# Patient Record
Sex: Female | Born: 1990 | Race: Black or African American | Hispanic: No | Marital: Single | Smoking: Current every day smoker
Health system: Southern US, Community
[De-identification: ages and names within clinical notes are randomized; demographics above are authoritative.]

## PROBLEM LIST (undated history)

## (undated) ENCOUNTER — Inpatient Hospital Stay (HOSPITAL_COMMUNITY): Payer: Self-pay

## (undated) DIAGNOSIS — Z3490 Encounter for supervision of normal pregnancy, unspecified, unspecified trimester: Secondary | ICD-10-CM

## (undated) DIAGNOSIS — R51 Headache: Secondary | ICD-10-CM

## (undated) DIAGNOSIS — R569 Unspecified convulsions: Secondary | ICD-10-CM

## (undated) DIAGNOSIS — Z8759 Personal history of other complications of pregnancy, childbirth and the puerperium: Secondary | ICD-10-CM

## (undated) HISTORY — PX: DILATION AND CURETTAGE OF UTERUS: SHX78

---
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
## 2010-02-13 ENCOUNTER — Emergency Department (HOSPITAL_COMMUNITY): Admission: EM | Admit: 2010-02-13 | Discharge: 2010-02-13 | Payer: Self-pay | Admitting: Emergency Medicine

## 2010-06-23 ENCOUNTER — Emergency Department (HOSPITAL_COMMUNITY)
Admission: EM | Admit: 2010-06-23 | Discharge: 2010-06-23 | Payer: Self-pay | Source: Home / Self Care | Admitting: Emergency Medicine

## 2010-10-05 LAB — POCT I-STAT, CHEM 8
Calcium, Ion: 1.09 mmol/L — ABNORMAL LOW (ref 1.12–1.32)
Chloride: 107 mEq/L (ref 96–112)
HCT: 41 % (ref 36.0–46.0)
Hemoglobin: 13.9 g/dL (ref 12.0–15.0)
Sodium: 139 mEq/L (ref 135–145)
TCO2: 23 mmol/L (ref 0–100)

## 2010-10-05 LAB — DIFFERENTIAL
Eosinophils Relative: 0 % (ref 0–5)
Monocytes Absolute: 1.3 10*3/uL — ABNORMAL HIGH (ref 0.1–1.0)
Neutrophils Relative %: 85 % — ABNORMAL HIGH (ref 43–77)

## 2010-10-05 LAB — CBC
MCHC: 35 g/dL (ref 30.0–36.0)
Platelets: 226 10*3/uL (ref 150–400)
RDW: 12.4 % (ref 11.5–15.5)
WBC: 18.5 10*3/uL — ABNORMAL HIGH (ref 4.0–10.5)

## 2010-10-09 LAB — URINALYSIS, ROUTINE W REFLEX MICROSCOPIC
Nitrite: NEGATIVE
Protein, ur: 30 mg/dL — AB
Specific Gravity, Urine: 1.027 (ref 1.005–1.030)
pH: 7 (ref 5.0–8.0)

## 2010-10-09 LAB — BASIC METABOLIC PANEL
BUN: 10 mg/dL (ref 6–23)
CO2: 25 mEq/L (ref 19–32)
Calcium: 9.1 mg/dL (ref 8.4–10.5)
Chloride: 109 mEq/L (ref 96–112)
GFR calc non Af Amer: >60 mL/min (ref >60)
Sodium: 140 mEq/L (ref 135–145)

## 2010-10-09 LAB — POCT PREGNANCY, URINE: Preg Test, Ur: NEGATIVE

## 2010-10-09 LAB — URINE MICROSCOPIC-ADD ON

## 2010-10-09 LAB — GLUCOSE, CAPILLARY

## 2010-11-18 ENCOUNTER — Emergency Department (HOSPITAL_COMMUNITY)
Admission: EM | Admit: 2010-11-18 | Discharge: 2010-11-18 | Disposition: A | Discharge status: Home / Self Care | Payer: Self-pay | Attending: Emergency Medicine | Admitting: Emergency Medicine

## 2010-11-18 DIAGNOSIS — X58XXXA Exposure to other specified factors, initial encounter: Secondary | ICD-10-CM | POA: Insufficient documentation

## 2010-11-18 DIAGNOSIS — G40909 Epilepsy, unspecified, not intractable, without status epilepticus: Secondary | ICD-10-CM | POA: Insufficient documentation

## 2010-11-18 DIAGNOSIS — IMO0002 Reserved for concepts with insufficient information to code with codable children: Secondary | ICD-10-CM | POA: Insufficient documentation

## 2010-11-24 ENCOUNTER — Emergency Department (HOSPITAL_COMMUNITY)
Admission: EM | Admit: 2010-11-24 | Discharge: 2010-11-24 | Disposition: A | Discharge status: Home / Self Care | Payer: Medicaid Other | Attending: Emergency Medicine | Admitting: Emergency Medicine

## 2010-11-24 ENCOUNTER — Emergency Department (HOSPITAL_COMMUNITY): Payer: Medicaid Other

## 2010-11-24 DIAGNOSIS — G40909 Epilepsy, unspecified, not intractable, without status epilepticus: Secondary | ICD-10-CM | POA: Insufficient documentation

## 2010-11-24 DIAGNOSIS — F29 Unspecified psychosis not due to a substance or known physiological condition: Secondary | ICD-10-CM | POA: Insufficient documentation

## 2010-11-24 DIAGNOSIS — R51 Headache: Secondary | ICD-10-CM | POA: Insufficient documentation

## 2010-11-24 LAB — URINALYSIS, ROUTINE W REFLEX MICROSCOPIC
Bilirubin Urine: NEGATIVE
Glucose, UA: NEGATIVE mg/dL
Hgb urine dipstick: NEGATIVE
Protein, ur: NEGATIVE mg/dL
Urobilinogen, UA: 0.2 mg/dL (ref 0.0–1.0)
pH: 5.5 (ref 5.0–8.0)

## 2010-11-24 LAB — RAPID URINE DRUG SCREEN, HOSP PERFORMED
Benzodiazepines: NOT DETECTED
Cocaine: NOT DETECTED

## 2010-11-24 LAB — DIFFERENTIAL
Eosinophils Relative: 0 % (ref 0–5)
Monocytes Absolute: 0.8 10*3/uL (ref 0.1–1.0)
Monocytes Relative: 4 % (ref 3–12)

## 2010-11-24 LAB — CBC
MCV: 92.6 fL (ref 78.0–100.0)
RBC: 4.72 MIL/uL (ref 3.87–5.11)
WBC: 17.3 10*3/uL — ABNORMAL HIGH (ref 4.0–10.5)

## 2010-11-24 LAB — BASIC METABOLIC PANEL
CO2: 15 mEq/L — ABNORMAL LOW (ref 19–32)
Calcium: 9.7 mg/dL (ref 8.4–10.5)
Chloride: 102 mEq/L (ref 96–112)
Creatinine, Ser: 0.74 mg/dL (ref 0.4–1.2)
GFR calc non Af Amer: >60 mL/min (ref >60)
Glucose, Bld: 107 mg/dL — ABNORMAL HIGH (ref 70–99)
Sodium: 142 mEq/L (ref 135–145)

## 2010-11-25 ENCOUNTER — Ambulatory Visit (HOSPITAL_COMMUNITY)
Admit: 2010-11-25 | Discharge: 2010-11-25 | Disposition: A | Discharge status: Home / Self Care | Payer: Medicaid Other | Attending: Emergency Medicine | Admitting: Emergency Medicine

## 2010-11-25 DIAGNOSIS — J320 Chronic maxillary sinusitis: Secondary | ICD-10-CM | POA: Insufficient documentation

## 2010-11-25 DIAGNOSIS — F29 Unspecified psychosis not due to a substance or known physiological condition: Secondary | ICD-10-CM | POA: Insufficient documentation

## 2010-11-25 DIAGNOSIS — G930 Cerebral cysts: Secondary | ICD-10-CM | POA: Insufficient documentation

## 2010-11-25 DIAGNOSIS — R569 Unspecified convulsions: Secondary | ICD-10-CM | POA: Insufficient documentation

## 2010-11-25 DIAGNOSIS — R55 Syncope and collapse: Secondary | ICD-10-CM | POA: Insufficient documentation

## 2010-11-25 LAB — GLUCOSE, CAPILLARY: Glucose-Capillary: 113 mg/dL — ABNORMAL HIGH (ref 70–99)

## 2010-12-02 ENCOUNTER — Inpatient Hospital Stay (INDEPENDENT_AMBULATORY_CARE_PROVIDER_SITE_OTHER)
Admit: 2010-12-02 | Discharge: 2010-12-02 | Disposition: A | Discharge status: Home / Self Care | Payer: No Typology Code available for payment source

## 2010-12-02 ENCOUNTER — Emergency Department (HOSPITAL_COMMUNITY)
Admission: EM | Admit: 2010-12-02 | Discharge: 2010-12-02 | Discharge status: Against Medical Advice | Payer: No Typology Code available for payment source | Attending: Emergency Medicine | Admitting: Emergency Medicine

## 2010-12-02 ENCOUNTER — Emergency Department (INDEPENDENT_AMBULATORY_CARE_PROVIDER_SITE_OTHER): Payer: Medicaid Other

## 2010-12-02 DIAGNOSIS — M545 Low back pain: Secondary | ICD-10-CM

## 2010-12-02 DIAGNOSIS — R51 Headache: Secondary | ICD-10-CM

## 2011-04-18 LAB — POCT I-STAT, CHEM 8
BUN: 9
Calcium, Ion: 1.24
Chloride: 106
HCT: 42
Sodium: 141
TCO2: 26

## 2011-04-18 LAB — RAPID URINE DRUG SCREEN, HOSP PERFORMED
Amphetamines: NOT DETECTED
Cocaine: NOT DETECTED
Opiates: NOT DETECTED
Tetrahydrocannabinol: POSITIVE — AB

## 2011-04-18 LAB — URINALYSIS, ROUTINE W REFLEX MICROSCOPIC
Glucose, UA: NEGATIVE
Ketones, ur: 15 — AB
Leukocytes, UA: NEGATIVE
Nitrite: NEGATIVE
Protein, ur: 30 — AB
pH: 6.5

## 2011-04-18 LAB — URINE MICROSCOPIC-ADD ON

## 2011-04-18 LAB — ETHANOL: Alcohol, Ethyl (B): <5

## 2011-04-18 LAB — POCT PREGNANCY, URINE: Preg Test, Ur: NEGATIVE

## 2011-04-22 ENCOUNTER — Encounter (HOSPITAL_COMMUNITY): Payer: Self-pay

## 2011-04-22 ENCOUNTER — Inpatient Hospital Stay (HOSPITAL_COMMUNITY): Payer: Medicaid Other

## 2011-04-22 ENCOUNTER — Inpatient Hospital Stay (HOSPITAL_COMMUNITY)
Admission: AD | Admit: 2011-04-22 | Discharge: 2011-04-22 | Disposition: A | Discharge status: Home / Self Care | Payer: Medicaid Other | Source: Ambulatory Visit | Attending: Obstetrics & Gynecology | Admitting: Obstetrics & Gynecology

## 2011-04-22 DIAGNOSIS — R109 Unspecified abdominal pain: Secondary | ICD-10-CM | POA: Insufficient documentation

## 2011-04-22 DIAGNOSIS — O99891 Other specified diseases and conditions complicating pregnancy: Secondary | ICD-10-CM | POA: Insufficient documentation

## 2011-04-22 DIAGNOSIS — O26899 Other specified pregnancy related conditions, unspecified trimester: Secondary | ICD-10-CM

## 2011-04-22 HISTORY — DX: Unspecified convulsions: R56.9

## 2011-04-22 LAB — WET PREP, GENITAL: Yeast Wet Prep HPF POC: NONE SEEN

## 2011-04-22 LAB — URINALYSIS, ROUTINE W REFLEX MICROSCOPIC
Bilirubin Urine: NEGATIVE
Glucose, UA: NEGATIVE mg/dL
Hgb urine dipstick: NEGATIVE
Ketones, ur: NEGATIVE mg/dL
Protein, ur: NEGATIVE mg/dL
Urobilinogen, UA: 1 mg/dL (ref 0.0–1.0)

## 2011-04-22 LAB — CBC
HCT: 36.7 % (ref 36.0–46.0)
Hemoglobin: 12.4 g/dL (ref 12.0–15.0)
MCH: 31.1 pg (ref 26.0–34.0)
MCHC: 33.8 g/dL (ref 30.0–36.0)
MCV: 92 fL (ref 78.0–100.0)

## 2011-04-22 NOTE — ED Provider Notes (Signed)
History   Pt presents today c/o lower abd cramping that has worsened over the past 1-2wks. She states she missed her last period. She denies vag dc, bleeding, fever, or any other sx at this time.  Chief Complaint  Patient presents with  . Abdominal Pain   HPI  OB History    Grav Para Term Preterm Abortions TAB SAB Ect Mult Living   2 0 0 0 1 1 0 0 0 0       Past Medical History  Diagnosis Date  . Seizures   . Ovarian cyst     Past Surgical History  Procedure Date  . No past surgeries     No family history on file.  History  Substance Use Topics  . Smoking status: Former Smoker    Quit date: 04/22/2011  . Smokeless tobacco: Not on file  . Alcohol Use: No    Allergies: No Known Allergies  Prescriptions prior to admission  Medication Sig Dispense Refill  . ibuprofen (ADVIL,MOTRIN) 200 MG tablet Take 200 mg by mouth every 6 (six) hours as needed. For headache.       . levETIRAcetam (KEPPRA) 500 MG tablet Take 500 mg by mouth daily.          Review of Systems  Constitutional: Negative for fever.  Cardiovascular: Negative for chest pain.  Gastrointestinal: Positive for abdominal pain. Negative for nausea, vomiting, diarrhea and constipation.  Genitourinary: Negative for dysuria, urgency, frequency and hematuria.  Neurological: Negative for dizziness and headaches.  Psychiatric/Behavioral: Negative for depression and suicidal ideas.   Physical Exam   Blood pressure 117/69, pulse 86, temperature 98.7 F (37.1 C), temperature source Oral, resp. rate 16, height 5' 4.5" (1.638 m), weight 211 lb 6.4 oz (95.89 kg), last menstrual period 03/17/2011, SpO2 98.00%.  Physical Exam  Constitutional: She is oriented to person, place, and time. She appears well-developed and well-nourished. No distress.  HENT:  Head: Normocephalic and atraumatic.  Eyes: EOM are normal. Pupils are equal, round, and reactive to light.  GI: Soft. She exhibits no distension. There is no tenderness.  There is no rebound and no guarding.  Genitourinary: No bleeding around the vagina. No vaginal discharge found.       Uterus NL size and shape. No adnexal masses. Pt nontender on exam. Exam technically difficult secondary to increased body habitus.  Neurological: She is alert and oriented to person, place, and time.  Skin: Skin is warm and dry. She is not diaphoretic.  Psychiatric: She has a normal mood and affect. Her behavior is normal. Judgment and thought content normal.    MAU Course  Procedures  Wet prep and GC/Chlamydia cultures done.  Results for orders placed during the hospital encounter of 04/22/11 (from the past 24 hour(s))  URINALYSIS, ROUTINE W REFLEX MICROSCOPIC     Status: Abnormal   Collection Time   04/22/11  4:00 PM      Component Value Range   Color, Urine YELLOW  YELLOW    Appearance HAZY (*) CLEAR    Specific Gravity, Urine 1.015  1.005 - 1.030    pH 8.0  5.0 - 8.0    Glucose, UA NEGATIVE  NEGATIVE (mg/dL)   Hgb urine dipstick NEGATIVE  NEGATIVE    Bilirubin Urine NEGATIVE  NEGATIVE    Ketones, ur NEGATIVE  NEGATIVE (mg/dL)   Protein, ur NEGATIVE  NEGATIVE (mg/dL)   Urobilinogen, UA 1.0  0.0 - 1.0 (mg/dL)   Nitrite NEGATIVE  NEGATIVE    Leukocytes,  UA NEGATIVE  NEGATIVE   POCT PREGNANCY, URINE     Status: Normal   Collection Time   04/22/11  4:23 PM      Component Value Range   Preg Test, Ur POSITIVE    WET PREP, GENITAL     Status: Abnormal   Collection Time   04/22/11  4:34 PM      Component Value Range   Yeast, Wet Prep NONE SEEN  NONE SEEN    Trich, Wet Prep NONE SEEN  NONE SEEN    Clue Cells, Wet Prep NONE SEEN  NONE SEEN    WBC, Wet Prep HPF POC FEW (*) NONE SEEN   HCG, QUANTITATIVE, PREGNANCY     Status: Abnormal   Collection Time   04/22/11  4:40 PM      Component Value Range   hCG, Beta Chain, Quant, S 38 (*) <5 (mIU/mL)  CBC     Status: Abnormal   Collection Time   04/22/11  4:40 PM      Component Value Range   WBC 11.4 (*) 4.0 - 10.5  (K/uL)   RBC 3.99  3.87 - 5.11 (MIL/uL)   Hemoglobin 12.4  12.0 - 15.0 (g/dL)   HCT 16.1  09.6 - 04.5 (%)   MCV 92.0  78.0 - 100.0 (fL)   MCH 31.1  26.0 - 34.0 (pg)   MCHC 33.8  30.0 - 36.0 (g/dL)   RDW 40.9  81.1 - 91.4 (%)   Platelets 281  150 - 400 (K/uL)   US Ob Comp Less 14 Wks  04/22/2011  *RADIOLOGY REPORT*  Clinical Data: Pain, no vaginal bleeding  OBSTETRIC <14 WK [place] AND TRANSVAGINAL OB US  Technique:  Both transabdominal and transvaginal ultrasound examinations were performed for complete evaluation of the gestation as well as the maternal uterus, adnexal regions, and pelvic cul-de-sac.  Transvaginal technique was performed to assess early pregnancy.  Comparison:  None.  Intrauterine gestational sac:  Not visualized.  Maternal uterus/adnexae: Endometrial complex measures 11 mm in thickness.  Right ovary measures 1.6 x 2.8 x 1.8 cm. Left ovary measures 1.7 x 3.5 x 1.7 cm. No adnexal masses seen.  Trace fluid in the pelvic cul-de-sac.  IMPRESSION: No intrauterine gestational sac is seen.  Endometrial complex measures 11 mm.  Correlate with beta HCG.  By definition, this reflects a pregnancy of unknown location. Differential considerations include early normal IUP, early abnormal IUP, or nonvisualized ectopic pregnancy.  Differentiation is achieved via serial beta HCG supplemented by repeat sonography as clinically warranted.  Repeat ultrasound is suggested in 10 days (or earlier as clinically warranted).  Original Report Authenticated By: Charline Bills, M.D.   US Ob Transvaginal  04/22/2011  *RADIOLOGY REPORT*  Clinical Data: Pain, no vaginal bleeding  OBSTETRIC <14 WK [place] AND TRANSVAGINAL OB US  Technique:  Both transabdominal and transvaginal ultrasound examinations were performed for complete evaluation of the gestation as well as the maternal uterus, adnexal regions, and pelvic cul-de-sac.  Transvaginal technique was performed to assess early pregnancy.  Comparison:  None.  Intrauterine  gestational sac:  Not visualized.  Maternal uterus/adnexae: Endometrial complex measures 11 mm in thickness.  Right ovary measures 1.6 x 2.8 x 1.8 cm. Left ovary measures 1.7 x 3.5 x 1.7 cm. No adnexal masses seen.  Trace fluid in the pelvic cul-de-sac.  IMPRESSION: No intrauterine gestational sac is seen.  Endometrial complex measures 11 mm.  Correlate with beta HCG.  By definition, this reflects a pregnancy of unknown  location. Differential considerations include early normal IUP, early abnormal IUP, or nonvisualized ectopic pregnancy.  Differentiation is achieved via serial beta HCG supplemented by repeat sonography as clinically warranted.  Repeat ultrasound is suggested in 10 days (or earlier as clinically warranted).  Original Report Authenticated By: Charline Bills, M.D.     Assessment and Plan  Abd pain in preg: discussed with pt at length. She will return in 48hrs for a repeat B-quant. Discussed signs and sx of ectopic preg. Discussed diet, activity, risks, and precautions.  Clinton Gallant. Rice III, DrHSc, MPAS, PA-C  04/22/2011, 4:40 PM   Henrietta Hoover, PA 04/22/11 1741

## 2011-04-22 NOTE — Progress Notes (Signed)
Pt states she has been having lower abdominal pain for 1 1/2 weeks. Missed her last period. No bleeding or discharge. Headaches on and off.

## 2011-04-23 LAB — GC/CHLAMYDIA PROBE AMP, GENITAL
Chlamydia, DNA Probe: NEGATIVE
GC Probe Amp, Genital: NEGATIVE

## 2011-04-24 ENCOUNTER — Inpatient Hospital Stay (HOSPITAL_COMMUNITY)
Admission: AD | Admit: 2011-04-24 | Discharge: 2011-04-24 | Disposition: A | Discharge status: Home / Self Care | Payer: Medicaid Other | Source: Ambulatory Visit | Attending: Obstetrics & Gynecology | Admitting: Obstetrics & Gynecology

## 2011-04-24 DIAGNOSIS — O209 Hemorrhage in early pregnancy, unspecified: Secondary | ICD-10-CM | POA: Insufficient documentation

## 2011-04-24 DIAGNOSIS — O039 Complete or unspecified spontaneous abortion without complication: Secondary | ICD-10-CM

## 2011-04-24 LAB — HCG, QUANTITATIVE, PREGNANCY: hCG, Beta Chain, Quant, S: 24 m[IU]/mL — ABNORMAL HIGH (ref <5)

## 2011-04-24 NOTE — ED Provider Notes (Signed)
History   Chief Complaint:  Possible Pregnancy   Brittany Hendrix is  20 y.o. G2P0010 Patient's last menstrual period was 03/17/2011.[name] Patient is here for follow up of quantitative HCG and ongoing surveillance of pregnancy status.   She is [redacted]w[redacted]d weeks gestation  by LMP.    Since her last visit, the patient is without new complaint.   The patient reports bleeding as  none now.  She reports abd pain 3/10, less than over the past few weeks. She is tearful when notified of results. Partner present and supportive.  General ROS:  negative  Her previous Quantitative HCG values are:  04/22/11: 38  Physical Exam   Blood pressure 119/74, pulse 78, temperature 98.7 F (37.1 C), temperature source Oral, last menstrual period 03/17/2011.  Focused Gynecological Exam: examination not indicated  Labs: Results for orders placed during the hospital encounter of 04/24/11 (from the past 24 hour(s))  HCG, QUANTITATIVE, PREGNANCY     Status: Abnormal   Collection Time   04/24/11  9:33 AM      Component Value Range   hCG, Beta Chain, Quant, S 24 (*) <5 (mIU/mL)   Assessment:  [redacted]w[redacted]d weeks gestation  Likely SAB  Plan: The patient is instructed to follow up in in 2 days for repeat quant. Bleeding precautions Support given  Dorathy Kinsman 04/24/2011, 10:38 AM

## 2011-04-24 NOTE — Progress Notes (Signed)
Pt initially said pain was 6/10 but corrected to 3/10

## 2011-04-24 NOTE — ED Provider Notes (Signed)
Agree with above note.  Brittany Hendrix H. 04/24/2011 2:57 PM

## 2011-04-24 NOTE — Progress Notes (Signed)
Denies bleeding having light cramping pain level is the same.

## 2011-04-24 NOTE — ED Provider Notes (Signed)
Agree with above note.  Brittany Hendrix H. 04/24/2011 2:56 PM

## 2011-04-25 ENCOUNTER — Inpatient Hospital Stay (HOSPITAL_COMMUNITY)
Admission: AD | Admit: 2011-04-25 | Discharge: 2011-04-25 | Disposition: A | Discharge status: Home / Self Care | Payer: Medicaid Other | Source: Ambulatory Visit | Attending: Obstetrics and Gynecology | Admitting: Obstetrics and Gynecology

## 2011-04-25 ENCOUNTER — Encounter (HOSPITAL_COMMUNITY): Payer: Self-pay | Admitting: *Deleted

## 2011-04-25 DIAGNOSIS — O039 Complete or unspecified spontaneous abortion without complication: Secondary | ICD-10-CM | POA: Insufficient documentation

## 2011-04-25 LAB — HCG, QUANTITATIVE, PREGNANCY: hCG, Beta Chain, Quant, S: 23 m[IU]/mL — ABNORMAL HIGH (ref <5)

## 2011-04-25 NOTE — ED Provider Notes (Signed)
History   Pt presents today for f/u and repeat B-quant secondary to suspected AB. She states she is having minimal pain and only spotting. She denies fever or any other sx at this time.  Chief Complaint  Patient presents with  . Vaginal Bleeding   HPI  OB History    Grav Para Term Preterm Abortions TAB SAB Ect Mult Living   2 0 0 0 1 1 0 0 0 0       Past Medical History  Diagnosis Date  . Ovarian cyst   . Seizures     Last seizure 5 months ago    Past Surgical History  Procedure Date  . No past surgeries     No family history on file.  History  Substance Use Topics  . Smoking status: Former Smoker    Quit date: 04/22/2011  . Smokeless tobacco: Not on file  . Alcohol Use: No    Allergies: No Known Allergies  Prescriptions prior to admission  Medication Sig Dispense Refill  . ibuprofen (ADVIL,MOTRIN) 200 MG tablet Take 200 mg by mouth every 6 (six) hours as needed. For headache.       . levETIRAcetam (KEPPRA) 500 MG tablet Take 500 mg by mouth daily.          Review of Systems  Constitutional: Negative for fever.  Cardiovascular: Negative for chest pain.  Gastrointestinal: Positive for abdominal pain. Negative for nausea, vomiting, diarrhea and constipation.  Genitourinary: Negative for dysuria, urgency, frequency and hematuria.  Neurological: Negative for dizziness and headaches.  Psychiatric/Behavioral: Negative for depression and suicidal ideas.   Physical Exam   Blood pressure 120/71, pulse 78, temperature 98.4 F (36.9 C), temperature source Oral, resp. rate 16, height 5' 5.5" (1.664 m), weight 209 lb 6.4 oz (94.983 kg), last menstrual period 03/17/2011, SpO2 98.00%.  Physical Exam  Constitutional: She is oriented to person, place, and time. She appears well-developed and well-nourished. No distress.  HENT:  Head: Normocephalic and atraumatic.  Eyes: EOM are normal. Pupils are equal, round, and reactive to light.  GI: Soft. She exhibits no distension.  There is no tenderness. There is no rebound and no guarding.  Neurological: She is alert and oriented to person, place, and time.  Skin: Skin is warm and dry. She is not diaphoretic.  Psychiatric: She has a normal mood and affect. Her behavior is normal. Judgment and thought content normal.    MAU Course  Procedures  B-quant drawn.  Assessment and Plan  SAB: discussed with pt at length. I will call her with the results. Pt will likely need to return in 1wk for repeat B-quant. Discussed diet, activity, risks, and precautions.  Clinton Gallant. Lotus Gover III, DrHSc, MPAS, PA-C  04/25/2011, 11:34 AM   Henrietta Hoover, PA 04/25/11 1138

## 2011-04-25 NOTE — Progress Notes (Signed)
Pt states she had vaginal bleeding at 0400 and some abdominal cramping. Pt states she was seen in MAU 9-30 and was told her hormone level was dropping and she was probably having a miscarriage. Pt did not understand these results. Pt is not wearing a pad at this time and is not having active bleeding at this time. States continues to have some cramping.

## 2011-04-26 ENCOUNTER — Ambulatory Visit (HOSPITAL_COMMUNITY): Payer: Self-pay

## 2011-04-26 NOTE — ED Provider Notes (Signed)
Agree with above note.  Brittany Hendrix 04/26/2011 7:51 AM

## 2011-05-02 ENCOUNTER — Inpatient Hospital Stay (HOSPITAL_COMMUNITY)
Admission: AD | Admit: 2011-05-02 | Discharge: 2011-05-02 | Disposition: A | Discharge status: Home / Self Care | Payer: Medicaid Other | Source: Ambulatory Visit | Attending: Obstetrics & Gynecology | Admitting: Obstetrics & Gynecology

## 2011-05-02 DIAGNOSIS — O039 Complete or unspecified spontaneous abortion without complication: Secondary | ICD-10-CM

## 2011-05-02 NOTE — Progress Notes (Signed)
Pt LMP 03/17/2011, in for follow up labs.  Pt denies bleeding or cramping.

## 2011-05-02 NOTE — ED Provider Notes (Signed)
History   Pt presents today for f/u B-quant secondary to probable complete AB. She states she is doing well and has no pain or bleeding. She has no complaints at this time.  Chief Complaint  Patient presents with  . Follow-up   HPI  OB History    Grav Para Term Preterm Abortions TAB SAB Ect Mult Living   2 0 0 0 1 1 0 0 0 0       Past Medical History  Diagnosis Date  . Ovarian cyst   . Seizures     Last seizure 5 months ago    Past Surgical History  Procedure Date  . No past surgeries     No family history on file.  History  Substance Use Topics  . Smoking status: Former Smoker    Quit date: 04/22/2011  . Smokeless tobacco: Not on file  . Alcohol Use: No    Allergies: No Known Allergies  Prescriptions prior to admission  Medication Sig Dispense Refill  . ibuprofen (ADVIL,MOTRIN) 200 MG tablet Take 200 mg by mouth every 6 (six) hours as needed. For headache.       . levETIRAcetam (KEPPRA) 500 MG tablet Take 500 mg by mouth daily.          Review of Systems  Constitutional: Negative for fever and chills.  Cardiovascular: Negative for chest pain.  Gastrointestinal: Negative for nausea, vomiting, abdominal pain, diarrhea and constipation.  Genitourinary: Negative for dysuria, urgency, frequency and hematuria.  Neurological: Negative for dizziness and headaches.  Psychiatric/Behavioral: Negative for depression and suicidal ideas.   Physical Exam   Blood pressure 118/65, pulse 53, temperature 98.7 F (37.1 C), temperature source Oral, resp. rate 16, height 5\' 4"  (1.626 m), weight 211 lb 3.2 oz (95.8 kg), last menstrual period 03/17/2011.  Physical Exam  Constitutional: She is oriented to person, place, and time. She appears well-developed and well-nourished. No distress.  HENT:  Head: Normocephalic and atraumatic.  Eyes: EOM are normal. Pupils are equal, round, and reactive to light.  GI: Soft. She exhibits no distension. There is no tenderness. There is no  rebound and no guarding.  Neurological: She is alert and oriented to person, place, and time.  Skin: Skin is warm and dry. She is not diaphoretic.  Psychiatric: She has a normal mood and affect. Her behavior is normal. Judgment and thought content normal.    MAU Course  Procedures  Results for orders placed during the hospital encounter of 05/02/11 (from the past 24 hour(s))  HCG, QUANTITATIVE, PREGNANCY     Status: Normal   Collection Time   05/02/11  9:52 PM      Component Value Range   hCG, Beta Chain, Quant, S 1  <5 (mIU/mL)     Assessment and Plan  Complete AB: discussed with pt at length. Discussed diet, activity, risks, and precautions.  Clinton Gallant. Tiffany Calmes III, DrHSc, MPAS, PA-C  05/02/2011, 10:32 PM   Henrietta Hoover, PA 05/02/11 2235

## 2011-09-23 ENCOUNTER — Inpatient Hospital Stay (HOSPITAL_COMMUNITY)
Admission: AD | Admit: 2011-09-23 | Discharge: 2011-09-23 | Disposition: A | Discharge status: Home Health | Payer: Medicaid Other | Source: Ambulatory Visit | Attending: Obstetrics & Gynecology | Admitting: Obstetrics & Gynecology

## 2011-09-23 ENCOUNTER — Encounter (HOSPITAL_COMMUNITY): Payer: Self-pay | Admitting: *Deleted

## 2011-09-23 DIAGNOSIS — Z3201 Encounter for pregnancy test, result positive: Secondary | ICD-10-CM | POA: Insufficient documentation

## 2011-09-23 LAB — POCT PREGNANCY, URINE: Preg Test, Ur: POSITIVE — AB

## 2011-09-23 LAB — URINALYSIS, ROUTINE W REFLEX MICROSCOPIC
Bilirubin Urine: NEGATIVE
Hgb urine dipstick: NEGATIVE
Nitrite: NEGATIVE
Specific Gravity, Urine: 1.02 (ref 1.005–1.030)
pH: 6 (ref 5.0–8.0)

## 2011-09-23 NOTE — ED Provider Notes (Signed)
History     Chief Complaint  Patient presents with  . Possible Pregnancy   HPI 21 y.o. G4P0020 at [redacted]w[redacted]d by lmp. Pt had + UPT at home, denies pain or bleeding. Had SAB in October and states she just wants reassurance about this pregnancy.    Past Medical History  Diagnosis Date  . Ovarian cyst   . Seizures     Last seizure 5 months ago    Past Surgical History  Procedure Date  . No past surgeries     No family history on file.  History  Substance Use Topics  . Smoking status: Former Smoker    Quit date: 04/22/2011  . Smokeless tobacco: Not on file  . Alcohol Use: No    Allergies: No Known Allergies  Prescriptions prior to admission  Medication Sig Dispense Refill  . ibuprofen (ADVIL,MOTRIN) 200 MG tablet Take 200 mg by mouth every 6 (six) hours as needed. For headache.       . levETIRAcetam (KEPPRA) 500 MG tablet Take 500 mg by mouth daily.          Review of Systems  Constitutional: Negative.   Respiratory: Negative.   Cardiovascular: Negative.   Gastrointestinal: Negative for nausea, vomiting, abdominal pain, diarrhea and constipation.  Genitourinary: Negative for dysuria, urgency, frequency, hematuria and flank pain.       Negative for vaginal bleeding, vaginal discharge, dyspareunia  Musculoskeletal: Negative.   Neurological: Negative.   Psychiatric/Behavioral: Negative.    Physical Exam   Blood pressure 102/65, pulse 82, temperature 99.5 F (37.5 C), temperature source Oral, resp. rate 20, height 5' 4.25" (1.632 m), weight 211 lb 6 oz (95.879 kg), last menstrual period 08/27/2010, not currently breastfeeding.  Physical Exam  Nursing note and vitals reviewed. Constitutional: She is oriented to person, place, and time. She appears well-developed and well-nourished. No distress.  Cardiovascular: Normal rate.   Respiratory: Effort normal.  GI: Soft. There is no tenderness.  Neurological: She is alert and oriented to person, place, and time.  Skin: Skin  is warm and dry.  Psychiatric: She has a normal mood and affect.    MAU Course  Procedures  Results for orders placed during the hospital encounter of 09/23/11 (from the past 24 hour(s))  URINALYSIS, ROUTINE W REFLEX MICROSCOPIC     Status: Normal   Collection Time   09/23/11  9:30 PM      Component Value Range   Color, Urine YELLOW  YELLOW    APPearance CLEAR  CLEAR    Specific Gravity, Urine 1.020  1.005 - 1.030    pH 6.0  5.0 - 8.0    Glucose, UA NEGATIVE  NEGATIVE (mg/dL)   Hgb urine dipstick NEGATIVE  NEGATIVE    Bilirubin Urine NEGATIVE  NEGATIVE    Ketones, ur NEGATIVE  NEGATIVE (mg/dL)   Protein, ur NEGATIVE  NEGATIVE (mg/dL)   Urobilinogen, UA 0.2  0.0 - 1.0 (mg/dL)   Nitrite NEGATIVE  NEGATIVE    Leukocytes, UA NEGATIVE  NEGATIVE   POCT PREGNANCY, URINE     Status: Abnormal   Collection Time   09/23/11  9:34 PM      Component Value Range   Preg Test, Ur POSITIVE (*) NEGATIVE      Assessment and Plan  21 y.o. [id number] at [redacted]w[redacted]d Start prenatal care as soon as possible Rev'd precautions Pregnancy verification given  Brittany Hendrix 09/23/2011, 10:47 PM

## 2011-09-23 NOTE — Progress Notes (Signed)
Pt states, " I have been more tired and I took a pregnancy test an it was positive. I miscarried in Oct.

## 2011-09-23 NOTE — Progress Notes (Signed)
Patient is here with concerns of having another miscarriage. She states that she has no pain, or vaginal bleeding or discharge. She wants to be reassured that she won't have a miscarriage. She states that she has not missed a period yet. Her feb. Period was normal and she had a faint positive home pregnancy test.

## 2011-09-24 ENCOUNTER — Encounter (HOSPITAL_COMMUNITY): Payer: Self-pay | Admitting: Advanced Practice Midwife

## 2011-10-04 ENCOUNTER — Encounter (HOSPITAL_COMMUNITY): Payer: Self-pay | Admitting: *Deleted

## 2011-10-04 ENCOUNTER — Inpatient Hospital Stay (HOSPITAL_COMMUNITY)
Admission: AD | Admit: 2011-10-04 | Discharge: 2011-10-04 | Disposition: A | Discharge status: Home / Self Care | Payer: Medicaid Other | Source: Ambulatory Visit | Attending: Obstetrics & Gynecology | Admitting: Obstetrics & Gynecology

## 2011-10-04 ENCOUNTER — Inpatient Hospital Stay (HOSPITAL_COMMUNITY): Payer: Medicaid Other

## 2011-10-04 DIAGNOSIS — O26899 Other specified pregnancy related conditions, unspecified trimester: Secondary | ICD-10-CM

## 2011-10-04 DIAGNOSIS — O2390 Unspecified genitourinary tract infection in pregnancy, unspecified trimester: Secondary | ICD-10-CM | POA: Insufficient documentation

## 2011-10-04 DIAGNOSIS — B9689 Other specified bacterial agents as the cause of diseases classified elsewhere: Secondary | ICD-10-CM | POA: Insufficient documentation

## 2011-10-04 DIAGNOSIS — R109 Unspecified abdominal pain: Secondary | ICD-10-CM

## 2011-10-04 DIAGNOSIS — A499 Bacterial infection, unspecified: Secondary | ICD-10-CM | POA: Insufficient documentation

## 2011-10-04 DIAGNOSIS — N760 Acute vaginitis: Secondary | ICD-10-CM | POA: Insufficient documentation

## 2011-10-04 LAB — DIFFERENTIAL
Basophils Relative: 0 % (ref 0–1)
Eosinophils Absolute: 0 10*3/uL (ref 0.0–0.7)
Eosinophils Relative: 0 % (ref 0–5)
Monocytes Relative: 6 % (ref 3–12)
Neutrophils Relative %: 67 % (ref 43–77)

## 2011-10-04 LAB — WET PREP, GENITAL: Trich, Wet Prep: NONE SEEN

## 2011-10-04 LAB — CBC
Hemoglobin: 12.8 g/dL (ref 12.0–15.0)
MCH: 30.6 pg (ref 26.0–34.0)
MCHC: 33.6 g/dL (ref 30.0–36.0)
MCV: 91.1 fL (ref 78.0–100.0)

## 2011-10-04 LAB — ABO/RH: ABO/RH(D): A POS

## 2011-10-04 MED ORDER — METRONIDAZOLE 500 MG PO TABS: 500.0000 mg | ORAL_TABLET | Freq: Two times a day (BID) | ORAL | Status: AC

## 2011-10-04 NOTE — MAU Note (Signed)
Patient states she has been having abdominal cramping on and off. States she has had multiple SAB's and wants to make sure everything if OK. No bleeding or pain at this time.

## 2011-10-04 NOTE — Discharge Instructions (Signed)
Abdominal Pain During Pregnancy °Belly (abdominal) pain is common during pregnancy. Most of the time, it is not a serious problem. Other times, it can be a sign that something is wrong with the pregnancy. Always tell your doctor if you have belly pain. °HOME CARE °For mild pain: °· Do not have sex (intercourse) or put anything in your vagina until you feel better.  °· Rest until your pain stops. If your pain lasts longer than 1 hour, call your doctor.  °· Drink clear fluids if you feel sick to your stomach (nauseous).  °· Do not eat solid food until you feel better.  °· Only take medicine as told by your doctor.  °· Keep all doctor visits as told.  °GET HELP RIGHT AWAY IF:  °· You are bleeding, leaking fluid, or pieces of tissue come out of your vagina.  °· You have more pain or cramping.  °· You keep throwing up (vomiting).  °· You have pain when you pee (urinate) or have blood in your pee.  °· You have a fever.  °· You do not feel your baby moving as much.  °· You feel very weak or feel like passing out.  °· You have trouble breathing, with or without belly pain.  °· You have a very bad headache and belly pain.  °· You have fluid leaking from your vagina and belly pain.  °· You keep having watery poop (diarrhea).  °· Your belly pain does not go away after resting, or the pain gets worse.  °MAKE SURE YOU:  °· Understand these instructions.  °· Will watch your condition.  °· Will get help right away if you are not doing well or get worse.  °Document Released: 06/29/2009 Document Revised: 06/30/2011 Document Reviewed: 02/04/2011 °[employer]® Patient Information ©2012 [employer]. °

## 2011-10-04 NOTE — MAU Provider Note (Signed)
History   Pt presents today c/o lower abd pain for the past 1wk. She states she is concerned because she has had multiple miscarriages in the past. She denies vag bleeding or pain at this time. She does complain of some vag dc.   CSN: [id number]  Arrival date and time: 10/04/11 1615   None     Chief Complaint  Patient presents with  . Abdominal Cramping   HPI  OB History    Grav Para Term Preterm Abortions TAB SAB Ect Mult Living   4 0 0 0 3 1 2 0 0 0       Past Medical History  Diagnosis Date  . Ovarian cyst   . Seizures     Last seizure 5 months ago    Past Surgical History  Procedure Date  . Dilation and curettage of uterus     abortion    History reviewed. No pertinent family history.  History  Substance Use Topics  . Smoking status: Current Everyday Smoker -- 0.2 packs/day    Last Attempt to Quit: 04/22/2011  . Smokeless tobacco: Never Used  . Alcohol Use: No    Allergies: No Known Allergies  Prescriptions prior to admission  Medication Sig Dispense Refill  . levETIRAcetam (KEPPRA) 500 MG tablet Take 500 mg by mouth daily.        . Prenatal Vit-Fe Fumarate-FA (PRENATAL MULTIVITAMIN) TABS Take 1 tablet by mouth daily.        Review of Systems  Constitutional: Negative for fever and chills.  Eyes: Negative for blurred vision and double vision.  Respiratory: Negative for cough and hemoptysis.   Cardiovascular: Negative for chest pain and palpitations.  Gastrointestinal: Positive for abdominal pain. Negative for nausea, vomiting, diarrhea and constipation.  Genitourinary: Negative for dysuria, urgency, frequency and hematuria.  Neurological: Negative for dizziness and headaches.  Psychiatric/Behavioral: Negative for depression and suicidal ideas.   Physical Exam   Blood pressure 133/69, pulse 112, temperature 98.6 F (37 C), temperature source Oral, resp. rate 16, height 5\' 5"  (1.651 m), weight 212 lb (96.163 kg), last menstrual period 08/28/2011,  SpO2 99.00%.  Physical Exam  Nursing note and vitals reviewed. Constitutional: She is oriented to person, place, and time. She appears well-developed and well-nourished. No distress.  HENT:  Head: Normocephalic and atraumatic.  Eyes: EOM are normal. Pupils are equal, round, and reactive to light.  GI: Soft. She exhibits no distension and no mass. There is no tenderness. There is no rebound and no guarding.  Genitourinary: No bleeding around the vagina. Vaginal discharge found.       Cervix Lg/closed. Thin, milky vag dc present.  Neurological: She is alert and oriented to person, place, and time.  Skin: Skin is warm and dry. She is not diaphoretic.  Psychiatric: She has a normal mood and affect. Her behavior is normal. Judgment and thought content normal.    MAU Course  Procedures  Wet prep and GC/Chlamydia cultures done.  Results for orders placed during the hospital encounter of 10/04/11 (from the past 72 hour(s))  CBC     Status: Normal   Collection Time   10/04/11  4:35 PM      Component Value Range Comment   WBC 10.4  4.0 - 10.5 (K/uL)    RBC 4.18  3.87 - 5.11 (MIL/uL)    Hemoglobin 12.8  12.0 - 15.0 (g/dL)    HCT 40.9  81.1 - 91.4 (%)    MCV 91.1  78.0 - 100.0 (fL)  MCH 30.6  26.0 - 34.0 (pg)    MCHC 33.6  30.0 - 36.0 (g/dL)    RDW 16.1  09.6 - 04.5 (%)    Platelets 260  150 - 400 (K/uL)   DIFFERENTIAL     Status: Normal   Collection Time   10/04/11  4:35 PM      Component Value Range Comment   Neutrophils Relative 67  43 - 77 (%)    Neutro Abs 7.0  1.7 - 7.7 (K/uL)    Lymphocytes Relative 27  12 - 46 (%)    Lymphs Abs 2.8  0.7 - 4.0 (K/uL)    Monocytes Relative 6  3 - 12 (%)    Monocytes Absolute 0.6  0.1 - 1.0 (K/uL)    Eosinophils Relative 0  0 - 5 (%)    Eosinophils Absolute 0.0  0.0 - 0.7 (K/uL)    Basophils Relative 0  0 - 1 (%)    Basophils Absolute 0.0  0.0 - 0.1 (K/uL)   HCG, QUANTITATIVE, PREGNANCY     Status: Abnormal   Collection Time   10/04/11   4:35 PM      Component Value Range Comment   hCG, Beta Chain, Quant, S 7584 (*) <5 (mIU/mL)   ABO/RH     Status: Normal   Collection Time   10/04/11  4:35 PM      Component Value Range Comment   ABO/RH(D) A POS     WET PREP, GENITAL     Status: Abnormal   Collection Time   10/04/11  5:58 PM      Component Value Range Comment   Yeast Wet Prep HPF POC NONE SEEN  NONE SEEN     Trich, Wet Prep NONE SEEN  NONE SEEN     Clue Cells Wet Prep HPF POC MODERATE (*) NONE SEEN     WBC, Wet Prep HPF POC FEW (*) NONE SEEN  MANY BACTERIA SEEN    US shows single IUP with yolk sac. Assessment and Plan  Abd pain in preg: discussed with pt at length. Discussed diet, activity, risks, and precautions.  BV: tx with Flagyl. Warned of antabuse reaction. Discussed diet, activity, risks, and precautions.  Clinton Gallant. Jeremaih Klima III, DrHSc, MPAS, PA-C  10/04/2011, 5:58 PM

## 2011-10-05 LAB — GC/CHLAMYDIA PROBE AMP, GENITAL
Chlamydia, DNA Probe: NEGATIVE
GC Probe Amp, Genital: NEGATIVE

## 2011-10-05 NOTE — MAU Provider Note (Signed)
Attestation of Attending Supervision of Advanced Practitioner: Evaluation and management procedures were performed by the PA/NP/CNM/OB Fellow under my supervision/collaboration. Chart reviewed, and agree with management and plan.  Maisyn Nouri, M.D. 10/05/2011 1:10 PM   

## 2011-10-19 ENCOUNTER — Telehealth (HOSPITAL_COMMUNITY): Payer: Self-pay | Admitting: Nurse Practitioner

## 2011-10-19 NOTE — Telephone Encounter (Signed)
Client having difficulty with morning sickness all day.  Rx Phenergan 25 mg 1/2 to one tablet by mouth PRN q 4 hours as needed for vomiting (#20) no refills.  Advised to begin prenatal care as soon as possible.  Client awaiting Medicaid card.

## 2011-10-26 ENCOUNTER — Encounter (HOSPITAL_COMMUNITY): Payer: Self-pay | Admitting: Emergency Medicine

## 2011-10-26 ENCOUNTER — Emergency Department (HOSPITAL_COMMUNITY)
Admission: EM | Admit: 2011-10-26 | Discharge: 2011-10-26 | Disposition: A | Discharge status: Home / Self Care | Payer: Medicaid Other | Attending: Emergency Medicine | Admitting: Emergency Medicine

## 2011-10-26 ENCOUNTER — Emergency Department (HOSPITAL_COMMUNITY): Payer: Medicaid Other

## 2011-10-26 DIAGNOSIS — M545 Low back pain, unspecified: Secondary | ICD-10-CM | POA: Insufficient documentation

## 2011-10-26 DIAGNOSIS — O26899 Other specified pregnancy related conditions, unspecified trimester: Secondary | ICD-10-CM

## 2011-10-26 DIAGNOSIS — O99891 Other specified diseases and conditions complicating pregnancy: Secondary | ICD-10-CM | POA: Insufficient documentation

## 2011-10-26 DIAGNOSIS — O210 Mild hyperemesis gravidarum: Secondary | ICD-10-CM | POA: Insufficient documentation

## 2011-10-26 DIAGNOSIS — R109 Unspecified abdominal pain: Secondary | ICD-10-CM | POA: Insufficient documentation

## 2011-10-26 DIAGNOSIS — N898 Other specified noninflammatory disorders of vagina: Secondary | ICD-10-CM | POA: Insufficient documentation

## 2011-10-26 HISTORY — DX: Personal history of other complications of pregnancy, childbirth and the puerperium: Z87.59

## 2011-10-26 LAB — POCT I-STAT, CHEM 8
BUN: 4 mg/dL — ABNORMAL LOW (ref 6–23)
Creatinine, Ser: 0.5 mg/dL (ref 0.50–1.10)
Hemoglobin: 12.6 g/dL (ref 12.0–15.0)
Potassium: 4.3 mEq/L (ref 3.5–5.1)
Sodium: 139 mEq/L (ref 135–145)

## 2011-10-26 LAB — URINALYSIS, ROUTINE W REFLEX MICROSCOPIC
Leukocytes, UA: NEGATIVE
Nitrite: NEGATIVE
Specific Gravity, Urine: 1.022 (ref 1.005–1.030)
Urobilinogen, UA: 0.2 mg/dL (ref 0.0–1.0)

## 2011-10-26 LAB — WET PREP, GENITAL: Yeast Wet Prep HPF POC: NONE SEEN

## 2011-10-26 LAB — ABO/RH: ABO/RH(D): A POS

## 2011-10-26 MED ORDER — PROMETHAZINE HCL 25 MG PO TABS: 25.0000 mg | ORAL_TABLET | Freq: Four times a day (QID) | ORAL | Status: DC | PRN

## 2011-10-26 MED ORDER — PRENATAL COMPLETE 14-0.4 MG PO TABS: 1.0000 | ORAL_TABLET | ORAL | Status: DC

## 2011-10-26 NOTE — ED Provider Notes (Signed)
History     CSN: [id number]  Arrival date & time 10/26/11  1803   First MD Initiated Contact with Patient 10/26/11 1855      Chief Complaint  Patient presents with  . Abdominal Pain    (Consider location/radiation/quality/duration/timing/severity/associated sxs/prior treatment) Patient is a 21 y.o. female presenting with abdominal pain. The history is provided by the patient.  Abdominal Pain The primary symptoms of the illness include abdominal pain, nausea, vomiting and diarrhea. The primary symptoms of the illness do not include fever, hematemesis, hematochezia, dysuria, vaginal discharge or vaginal bleeding. The current episode started more than 2 days ago. The problem has not changed since onset. The patient states that she believes she is currently pregnant. The patient has not had a change in bowel habit. Additional symptoms associated with the illness include back pain. Symptoms associated with the illness do not include chills, constipation or frequency.   patient is G1 P0, states she is  approximately 2 months pregnant. States her last menstrual period was 08/28/2011. Since confirmation of pregnancy at [facility] patient states she's had intermittent lower abdominal cramping low back pain and intermittent nausea and vomiting. Denies any unusual vaginal discharge or bleeding.    Past Medical History  Diagnosis Date  . Ovarian cyst   . Seizures     Last seizure 5 months ago  . Abortion history     Past Surgical History  Procedure Date  . Dilation and curettage of uterus     abortion    Family History  Problem Relation Age of Onset  . Asthma Brother     History  Substance Use Topics  . Smoking status: Current Everyday Smoker -- 0.2 packs/day    Types: Cigarettes    Last Attempt to Quit: 04/22/2011  . Smokeless tobacco: Never Used  . Alcohol Use: No    OB History    Grav Para Term Preterm Abortions TAB SAB Ect Mult Living   4 0 0 0 3 1 2 0 0 0        Review of Systems  Constitutional: Negative.  Negative for fever and chills.  HENT: Negative.   Eyes: Negative.   Respiratory: Negative.   Cardiovascular: Negative.   Gastrointestinal: Positive for nausea, vomiting, abdominal pain and diarrhea. Negative for constipation, hematochezia and hematemesis.  Genitourinary: Negative.  Negative for dysuria, frequency, vaginal bleeding and vaginal discharge.  Musculoskeletal: Positive for back pain.  Skin: Negative.   Neurological: Negative.   Hematological: Negative.   Psychiatric/Behavioral: Negative.     Allergies  Review of patient's allergies indicates no known allergies.  Home Medications   Current Outpatient Rx  Name Route Sig Dispense Refill  . ACETAMINOPHEN 325 MG PO TABS Oral Take 325 mg by mouth every 6 (six) hours as needed. Pain.    [name] LEVETIRACETAM 500 MG PO TABS Oral Take 500 mg by mouth 2 (two) times daily.     [name] PRENATAL MULTIVITAMIN CH Oral Take 1 tablet by mouth daily. Gummy vitamin.      BP 102/58  Pulse 53  Temp(Src) 98.8 F (37.1 C) (Oral)  Resp 16  SpO2 100%  LMP 08/28/2011  Physical Exam  Constitutional: She is oriented to person, place, and time. She appears well-developed and well-nourished.  HENT:  Head: Normocephalic and atraumatic.  Eyes: Conjunctivae are normal.  Neck: Neck supple.  Cardiovascular: Normal rate and regular rhythm.   Pulmonary/Chest: Effort normal and breath sounds normal.  Abdominal: Soft. Bowel sounds are normal.  Genitourinary: Uterus  normal. There is no rash, tenderness or lesion on the right labia. There is no rash, tenderness or lesion on the left labia. Cervix exhibits no motion tenderness, no discharge and no friability. Right adnexum displays no mass, no tenderness and no fullness. Left adnexum displays no mass, no tenderness and no fullness. Vaginal discharge found.       Thick white vaginal discharge  Musculoskeletal: Normal range of motion.  Neurological: She is alert  and oriented to person, place, and time.  Skin: Skin is warm and dry. No erythema.  Psychiatric: She has a normal mood and affect.    ED Course  Pelvic exam Date/Time: 10/26/2011 9:10 PM Performed by: Leanne Chang Authorized by: Leanne Chang Consent: Verbal consent obtained. Risks and benefits: risks, benefits and alternatives were discussed Consent given by: patient Patient understanding: patient states understanding of the procedure being performed Required items: required blood products, implants, devices, and special equipment available Patient identity confirmed: verbally with patient and arm band Local anesthesia used: no Patient sedated: no Patient tolerance: Patient tolerated the procedure well with no immediate complications.   Findings and clinical impression discussed with patient. Patient encouraged to get established with the health department for prenatal care. Will discharge home with medication for nausea and prenatal vitamins. Patient instructed to return to [facility] for any pregnancy related symptoms such as lower abdominal cramping, vaginal bleeding or other concerns. Patient agreeable with plan.  Labs Reviewed  POCT I-STAT, CHEM 8 - Abnormal; Notable for the following:    BUN 4 (*)    All other components within normal limits  URINALYSIS, ROUTINE W REFLEX MICROSCOPIC  ABO/RH  WET PREP, GENITAL  GC/CHLAMYDIA PROBE AMP, GENITAL  HCG, QUANTITATIVE, PREGNANCY   US Ob Comp Less 14 Wks  10/26/2011  *RADIOLOGY REPORT*  Clinical Data: 21 year old female with abdominal cramps.  Estimated gestational age by comparison ultrasound 8 weeks and 6 days.  OBSTETRIC <14 WK ULTRASOUND  Technique:  Transabdominal ultrasound was performed for evaluation of the gestation as well as the maternal uterus and adnexal regions.  Comparison:  10/04/2011.  Intrauterine gestational sac: Single Yolk sac: Visible Embryo: Visible Cardiac Activity: Detected Heart Rate: 157 bpm   CRL:  18.3 mm  8w  2d         [place] EDC: 06/04/2012  Maternal uterus/Adnexae: No subchorionic hemorrhage or pelvic free fluid.  Both ovaries appear normal.  The right measures 3.4 x 2.0 x 2.2 cm and the left measures 3.3 x 1.7 x 1.4 cm.  IMPRESSION: Viable singleton intrauterine pregnancy with estimated gestational age of [redacted] weeks and 2 days by crown-rump length.  Original Report Authenticated By: Harley Hallmark, M.D.     No diagnosis found.    MDM  HPI/PE and clinical findings c/w 1. IUP ([redacted]w[redacted]d, no abnormalities noted on ultrasound, patient with mild intermittent lower abdominal cramping and low back pain since confirmation of pregnancy several weeks ago. No vaginal bleeding or unusual discharge. Pt does report intermittent nausea and vomitingw/ and episode of vomiting every 2-3 days. Wet prep shows few clue cells and few WBCs, UA is positive nitrates 3-6 WBCs per field (will culture) patient is encouraged to get established prenatal care, will discharge home with medication for nausea and prenatal vitamins)        Leanne Chang, NP 10/26/11 2155  Roma Kayser Raenah Murley, NP 10/26/11 2156

## 2011-10-26 NOTE — ED Provider Notes (Signed)
Medical screening examination/treatment/procedure(s) were performed by non-physician practitioner and as supervising physician I was immediately available for consultation/collaboration.   Celene Kras, MD 10/26/11 2024

## 2011-10-26 NOTE — ED Notes (Signed)
Patient transported to Ultrasound 

## 2011-10-26 NOTE — Discharge Instructions (Signed)
Please review the instructions below. As discussed, your ultrasound tonight was normal. Your urine showed a few white blood cells which may indicate an early infection, we will culture this urine and if treatment is necessary you will be notified by phone. You will need to contact the [facility] and find out what you need to do to get started with your prenatal care. Take a prenatal vitamin once daily. Take the medication for nausea as needed per instructions. If you should have any concerns such as lower abdominal pain, vaginal bleeding or discharge return to the [facility].    Abdominal Pain During Pregnancy Abdominal discomfort is common in pregnancy. Most of the time, it does not cause harm. There are many causes of abdominal pain. Some causes are more serious than others. Some of the causes of abdominal pain in pregnancy are easily diagnosed. Occasionally, the diagnosis takes time to understand. Other times, the cause is not determined. Abdominal pain can be a sign that something is very wrong with the pregnancy, or the pain may have nothing to do with the pregnancy at all. For this reason, always tell your caregiver if you have any abdominal discomfort. CAUSES Common and harmless causes of abdominal pain include:  Constipation.   Excess gas and bloating.   Round ligament pain. This is pain that is felt in the folds of the groin.   The position the baby or placenta is in.   Baby kicks.   Braxton-Hicks contractions. These are mild contractions that do not cause cervical dilation.  Serious causes of abdominal pain include:  Ectopic pregnancy. This happens when a fertilized egg implants outside of the uterus.   Miscarriage.   Preterm labor. This is when labor starts at less than 37 weeks of pregnancy.   Placental abruption. This is when the placenta partially or completely separates from the uterus.   Preeclampsia.  This is often associated with high blood pressure and has been referred to as "toxemia in pregnancy."   Uterine or amniotic fluid infections.  Causes unrelated to pregnancy include:  Urinary tract infection.   Gallbladder stones or inflammation.   Hepatitis or other liver illness.   Intestinal problems, stomach flu, food poisoning, or ulcer.   Appendicitis.   Kidney (renal) stones.   Kidney infection (pylonephritis).  HOME CARE INSTRUCTIONS  For mild pain:  Do not have sexual intercourse or put anything in your vagina until your symptoms go away completely.   Get plenty of rest until your pain improves. If your pain does not improve in 1 hour, call your caregiver.   Drink clear fluids if you feel nauseous. Avoid solid food as long as you are uncomfortable or nauseous.   Only take medicine as directed by your caregiver.   Keep all follow-up appointments with your caregiver.  SEEK IMMEDIATE MEDICAL CARE IF:  You are bleeding, leaking fluid, or passing tissue from the vagina.   You have increasing pain or cramping.   You have persistent vomiting.   You have painful or bloody urination.   You have a fever.   You notice a decrease in your baby's movements.   You have extreme weakness or feel faint.   You have shortness of breath, with or without abdominal pain.   You develop a severe headache with abdominal pain.   You have abnormal vaginal discharge with abdominal pain.   You have persistent diarrhea.   You have abdominal pain that continues even  after rest, or gets worse.  MAKE SURE YOU:   Understand these instructions.   Will watch your condition.   Will get help right away if you are not doing well or get worse.  Document Released: 07/11/2005 Document Revised: 06/30/2011 Document Reviewed: 02/04/2011

## 2011-10-26 NOTE — ED Notes (Signed)
PA, Schorr at bedside. 

## 2011-10-26 NOTE — ED Notes (Signed)
Pt co abdominal pain, described as cramps, pt states she is 2 months pregnant and has had cramps the entire pregnancy. Pt reports she had a miscarriage in October 2012 and states she had cramps before having a miscarriage. Pt also co of nausea and vomiting NO vaginal bleeding or discharge.

## 2011-10-26 NOTE — ED Notes (Signed)
Patient transported to US 

## 2011-10-26 NOTE — ED Notes (Signed)
PA, Paz at bedside.

## 2011-10-26 NOTE — ED Provider Notes (Signed)
This patient has been screened, labs, & imaging ordered. VSS, NAD.   Jaci Carrel, [place] 10/26/11 2022

## 2011-10-28 LAB — URINE CULTURE
Colony Count: 30000
Culture  Setup Time: 201304040530

## 2011-10-28 LAB — GC/CHLAMYDIA PROBE AMP, GENITAL: GC Probe Amp, Genital: NEGATIVE

## 2011-10-28 NOTE — ED Provider Notes (Signed)
Medical screening examination/treatment/procedure(s) were performed by non-physician practitioner and as supervising physician I was immediately available for consultation/collaboration.   Alizae Bechtel R Esma Kilts, MD 10/28/11 0703 

## 2011-11-02 ENCOUNTER — Other Ambulatory Visit: Payer: Self-pay | Admitting: Advanced Practice Midwife

## 2011-11-02 MED ORDER — METRONIDAZOLE 500 MG PO TABS: 500.0000 mg | ORAL_TABLET | Freq: Two times a day (BID) | ORAL | Status: AC

## 2011-11-22 ENCOUNTER — Inpatient Hospital Stay (HOSPITAL_COMMUNITY)
Admission: AD | Admit: 2011-11-22 | Discharge: 2011-11-22 | Disposition: A | Discharge status: Home / Self Care | Payer: Medicaid Other | Source: Ambulatory Visit | Attending: Obstetrics and Gynecology | Admitting: Obstetrics and Gynecology

## 2011-11-22 ENCOUNTER — Other Ambulatory Visit (HOSPITAL_COMMUNITY): Payer: Self-pay | Admitting: Obstetrics and Gynecology

## 2011-11-22 ENCOUNTER — Ambulatory Visit (HOSPITAL_COMMUNITY)
Admission: RE | Admit: 2011-11-22 | Discharge: 2011-11-22 | Disposition: A | Discharge status: Home / Self Care | Payer: Medicaid Other | Source: Ambulatory Visit | Attending: Obstetrics and Gynecology | Admitting: Obstetrics and Gynecology

## 2011-11-22 DIAGNOSIS — O3680X0 Pregnancy with inconclusive fetal viability, not applicable or unspecified: Secondary | ICD-10-CM

## 2011-11-22 DIAGNOSIS — O368390 Maternal care for abnormalities of the fetal heart rate or rhythm, unspecified trimester, not applicable or unspecified: Secondary | ICD-10-CM | POA: Insufficient documentation

## 2011-11-22 NOTE — MAU Note (Signed)
Patient has an outpatient appointment at 1045 for ultrasound. Not to be seen in MAU at this time.

## 2011-12-21 ENCOUNTER — Inpatient Hospital Stay (HOSPITAL_COMMUNITY)
Admission: AD | Admit: 2011-12-21 | Discharge: 2011-12-21 | Disposition: A | Discharge status: Home / Self Care | Payer: Medicaid Other | Source: Ambulatory Visit | Attending: Obstetrics and Gynecology | Admitting: Obstetrics and Gynecology

## 2011-12-21 ENCOUNTER — Encounter (HOSPITAL_COMMUNITY): Payer: Self-pay | Admitting: *Deleted

## 2011-12-21 DIAGNOSIS — O021 Missed abortion: Secondary | ICD-10-CM | POA: Insufficient documentation

## 2011-12-21 DIAGNOSIS — O039 Complete or unspecified spontaneous abortion without complication: Secondary | ICD-10-CM

## 2011-12-21 LAB — BASIC METABOLIC PANEL
BUN: 8 mg/dL (ref 6–23)
Calcium: 9.1 mg/dL (ref 8.4–10.5)
Creatinine, Ser: 0.59 mg/dL (ref 0.50–1.10)
GFR calc non Af Amer: >90 mL/min (ref >90)
Glucose, Bld: 105 mg/dL — ABNORMAL HIGH (ref 70–99)
Sodium: 134 mEq/L — ABNORMAL LOW (ref 135–145)

## 2011-12-21 LAB — CBC
HCT: 35.8 % — ABNORMAL LOW (ref 36.0–46.0)
MCH: 30.8 pg (ref 26.0–34.0)
MCHC: 33.5 g/dL (ref 30.0–36.0)
MCHC: 33.6 g/dL (ref 30.0–36.0)
Platelets: 208 10*3/uL (ref 150–400)
RDW: 12.3 % (ref 11.5–15.5)
RDW: 12.4 % (ref 11.5–15.5)

## 2011-12-21 MED ORDER — NORGESTIMATE-ETH ESTRADIOL 0.25-35 MG-MCG PO TABS: 1.0000 | ORAL_TABLET | Freq: Every day | ORAL | Status: DC

## 2011-12-21 MED ORDER — LACTATED RINGERS IV BOLUS (SEPSIS)
1000.0000 mL | Freq: Once | INTRAVENOUS | Status: AC
  Administered 2011-12-21: 1000 mL via INTRAVENOUS

## 2011-12-21 MED ORDER — FERROUS SULFATE 325 (65 FE) MG PO TABS: 325.0000 mg | ORAL_TABLET | Freq: Every day | ORAL | Status: DC

## 2011-12-21 MED ORDER — BUTORPHANOL TARTRATE 2 MG/ML IJ SOLN
1.0000 mg | Freq: Once | INTRAMUSCULAR | Status: AC
  Administered 2011-12-21: 1 mg via INTRAVENOUS
  Filled 2011-12-21: qty 1

## 2011-12-21 MED ORDER — OXYTOCIN 20 UNITS IN LACTATED RINGERS INFUSION - SIMPLE
500.0000 mL/h | Freq: Once | INTRAVENOUS | Status: AC
  Administered 2011-12-21: 500 mL/h via INTRAVENOUS

## 2011-12-21 MED ORDER — SODIUM CHLORIDE 0.9 % IV BOLUS (SEPSIS)
1000.0000 mL | Freq: Once | INTRAVENOUS | Status: AC
  Administered 2011-12-21: 1000 mL via INTRAVENOUS

## 2011-12-21 MED ORDER — OXYTOCIN 20 UNITS IN LACTATED RINGERS INFUSION - SIMPLE
INTRAVENOUS | Status: AC
  Administered 2011-12-21: 500 mL/h via INTRAVENOUS
  Filled 2011-12-21: qty 1000

## 2011-12-21 NOTE — Progress Notes (Signed)
Patient has stabilized, now able to take p.o. Will recheck CBC to document hgb change, consider discharge home if ambulatory without dizziness

## 2011-12-21 NOTE — Discharge Instructions (Signed)
Miscarriage (Spontaneous Miscarriage)  A miscarriage is when you lose your baby before the twentieth week of pregnancy. Miscarriages happen in 15-20% of pregnancies. Most miscarriages happen in the first 13 weeks of the pregnancy. In medical terms, this is called a spontaneous miscarriage or early pregnancy loss. No further treatment is needed when the miscarriage is complete and all products of conception have been passed out of the body. You can begin trying for another pregnancy as soon as your caregiver says it is okay.  CAUSES    Most causes are not known.   Genetic problems like abnormal, not enough or too many chromosomes.   Infection of the cervix or uterus.   An abnormal shaped uterus, fibroid tumors or congenital abnormalities.   Hormone problems.   Medical problems.   Incompetent cervix, the tissue in the cervix is not strong enough to hold the pregnancy.   Smoking, too much alcohol use and illegal drugs.   Trauma.  SYMPTOMS    Bleeding or spotting from the vagina.   Cramping of the lower abdomen.   Passing of fluid from the vagina with or without cramps or pain.   Passing fetal tissue.  TREATMENT    Sometimes no further treatment is necessary if you pass all the tissue in the uterus.   If partial parts of the fetus or placenta remain in the body (incomplete miscarriage), tissue left behind may become infected. Usually a D and C (Dilatation and Curettage) suction or scrapping of the uterus is necessary to remove the remaining tissue in uterus. The procedure is only done when your caregiver knows that there is no chance for the pregnancy to continue. This is determined by a physical exam, a negative pregnancy test, blood tests and perhaps an ultrasound revealing a dead fetus or no fetus developing because a problem occurred at conception (when the sperm and egg unite).   Medications may be necessary, antibiotics if there is an infection or medications to contract the uterus if there is a  lot of bleeding.   If you have Rh negative blood and your partner is Rh positive, you will need a Rho-gam shot (an immune globulin vaccine). This will protect your baby from having Rh blood problems in future pregnancies.  HOME CARE INSTRUCTIONS    Your caregiver may order bed rest (up to the bathroom only). He or she may allow you to continue light activity. You may need to make arrangements for the care of children and for any other responsibilities.   Keep track of the number of pads you use each day and how soaked (saturated) they are. Record this information.   Do not use tampons. Do not douche or have sexual intercourse until approved by your caregiver.   Only take over-the-counter or prescription medicines for pain, discomfort or fever as directed by your caregiver.   Do not take aspirin because it can cause bleeding.   It is very important to keep all follow-up appointments for re-evaluations and continuing management.   Tell your caregiver if you are experiencing domestic violence.   Women who have an Rh negative blood type (i.e., A, B, AB, or O negative) need to receive a drug called Rh(D) immune globulin (RhoGam). This medicine helps protect future fetuses against problems that can occur if an Rh negative mother is carrying a baby who is Rh positive.   If you and/or your partner are having problems with guilt or grieving, talk to your caregiver or seek counseling to help   you cope with the pregnancy loss. Allow enough time to grieve before trying to get pregnant again.  SEEK IMMEDIATE MEDICAL CARE IF:    You have severe cramps or pain in your stomach, back, or belly (abdomen).   You have a fever.   You pass large clots or tissue. Save any tissue for your caregiver to inspect.   Your bleeding increases.   You become light-headed, weak or have fainting episodes.   You develop chills.  Document Released: 01/04/2001 Document Revised: 06/30/2011 Document Reviewed: 02/11/2008  [employer] Patient  Information 2012 [employer].

## 2011-12-21 NOTE — Progress Notes (Signed)
Pt  Sitting up in bed. Scant amount of bleeding noted on peri pad.

## 2011-12-21 NOTE — MAU Provider Note (Signed)
See Documentation by Kerrie Buffalo, NP.  Pt presented with active bleeding as a part of miscarriage. Bedside ultrasound shows a small anteflexed uterus with heterogenous tissue and probable clots.  No IUGS notable in uterus.  Speculum exam shows  Generous clots and visible gestational sac, which upon rupture reveals an autolyzed fetus consistent with 8 wk, Placenta and gest sac extracted from lower uterus via ring forceps, intact.  Bleeding improved. Pt then at 19:20 developed nausea, sat up with syncopal response, which responded to supine position and smelling salts with pulse 140, and BP 104/40 quickly improving to 90/60 with pulse 94 with initiation of iv bolus in addition to pitocin infusion.  Patient having no continued bleeding.   Will hydrate x 2 hours, with continued assessments.

## 2011-12-21 NOTE — MAU Provider Note (Signed)
Pt now alert, desires to void.  No continued bleeding.  Hgb returns@ 9.7. Events of evening reviewed with patient and partner. Will d/c on Sprintec.

## 2011-12-21 NOTE — MAU Provider Note (Signed)
History     CSN: [id number]  Arrival date & time 12/21/11  1857   None     No chief complaint on file.    HPI Brittany Hendrix is a 21 y.o. female @ [redacted]w[redacted]d gestation by LMP. Had previous ultrasound that showed an 8 week IUFD. Patient doing expectant management. Today started bleeding heavy, passing clots and feeling dizzy. Brought to MAU by family. The history was provided by the patient.  Past Medical History  Diagnosis Date  . Ovarian cyst   . Seizures     Last seizure 5 months ago  . Abortion history     Past Surgical History  Procedure Date  . Dilation and curettage of uterus     abortion    Family History  Problem Relation Age of Onset  . Asthma Brother     History  Substance Use Topics  . Smoking status: Current Everyday Smoker -- 0.2 packs/day    Types: Cigarettes    Last Attempt to Quit: 04/22/2011  . Smokeless tobacco: Never Used  . Alcohol Use: No    OB History    Grav Para Term Preterm Abortions TAB SAB Ect Mult Living   4 0 0 0 3 1 2 0 0 0       Review of Systems  Allergies  Review of patient's allergies indicates no known allergies.  Home Medications  No current outpatient prescriptions on file.  LMP 08/28/2011  Physical Exam  ED Course: Dr. Emelda Fear in to examine the patient and take over care.  Procedures    MDM

## 2011-12-21 NOTE — MAU Note (Signed)
Pt states that she was evaluated approximately 3 weeks ago and was told that the baby had stopped growing and no fetal heart rate confirmed by ultrasound. Pt states she was at work this evening and started bleeding and passing clots. When she arrived pt had large amount of blood noted on pants with several clots. Pt was evaluated by Dr Emelda Fear  With pelvic exam. Pt passed tissue and clots with pelvic exam.

## 2012-01-24 ENCOUNTER — Other Ambulatory Visit: Payer: Self-pay | Admitting: Family

## 2012-01-24 MED ORDER — NORGESTIMATE-ETH ESTRADIOL 0.25-35 MG-MCG PO TABS: 1.0000 | ORAL_TABLET | Freq: Every day | ORAL | Status: DC

## 2012-04-08 ENCOUNTER — Emergency Department (HOSPITAL_COMMUNITY)
Admission: EM | Admit: 2012-04-08 | Discharge: 2012-04-08 | Disposition: A | Discharge status: Home / Self Care | Payer: Medicaid Other | Attending: Emergency Medicine | Admitting: Emergency Medicine

## 2012-04-08 DIAGNOSIS — G40802 Other epilepsy, not intractable, without status epilepticus: Secondary | ICD-10-CM | POA: Insufficient documentation

## 2012-04-08 DIAGNOSIS — R109 Unspecified abdominal pain: Secondary | ICD-10-CM | POA: Insufficient documentation

## 2012-04-08 DIAGNOSIS — G40909 Epilepsy, unspecified, not intractable, without status epilepticus: Secondary | ICD-10-CM

## 2012-04-08 DIAGNOSIS — F17200 Nicotine dependence, unspecified, uncomplicated: Secondary | ICD-10-CM | POA: Insufficient documentation

## 2012-04-08 DIAGNOSIS — Z79899 Other long term (current) drug therapy: Secondary | ICD-10-CM | POA: Insufficient documentation

## 2012-04-08 DIAGNOSIS — R29818 Other symptoms and signs involving the nervous system: Secondary | ICD-10-CM

## 2012-04-08 LAB — BASIC METABOLIC PANEL
BUN: 7 mg/dL (ref 6–23)
GFR calc Af Amer: >90 mL/min (ref >90)
GFR calc non Af Amer: >90 mL/min (ref >90)
Potassium: 4.4 mEq/L (ref 3.5–5.1)

## 2012-04-08 LAB — URINALYSIS, ROUTINE W REFLEX MICROSCOPIC
Ketones, ur: NEGATIVE mg/dL
Leukocytes, UA: NEGATIVE
Nitrite: NEGATIVE
Specific Gravity, Urine: 1.018 (ref 1.005–1.030)
pH: 6.5 (ref 5.0–8.0)

## 2012-04-08 LAB — URINE MICROSCOPIC-ADD ON

## 2012-04-08 LAB — GLUCOSE, CAPILLARY: Glucose-Capillary: 87 mg/dL (ref 70–99)

## 2012-04-08 LAB — PREGNANCY, URINE: Preg Test, Ur: NEGATIVE

## 2012-04-08 MED ORDER — KETOROLAC TROMETHAMINE 30 MG/ML IJ SOLN
30.0000 mg | Freq: Once | INTRAMUSCULAR | Status: AC
  Administered 2012-04-08: 30 mg via INTRAVENOUS
  Filled 2012-04-08: qty 1

## 2012-04-08 MED ORDER — IBUPROFEN 600 MG PO TABS: 600.0000 mg | ORAL_TABLET | Freq: Four times a day (QID) | ORAL | Status: DC | PRN

## 2012-04-08 MED ORDER — METOCLOPRAMIDE HCL 5 MG/ML IJ SOLN
10.0000 mg | Freq: Once | INTRAMUSCULAR | Status: AC
  Administered 2012-04-08: 10 mg via INTRAVENOUS
  Filled 2012-04-08: qty 2

## 2012-04-08 MED ORDER — SODIUM CHLORIDE 0.9 % IV SOLN
Freq: Once | INTRAVENOUS | Status: AC
  Administered 2012-04-08: 19:00:00 via INTRAVENOUS

## 2012-04-08 NOTE — ED Notes (Signed)
ZOX:[id number]<VW> Expected date:<BR> Expected time:<BR> Means of arrival:<BR> Comments:<BR> Medic [age] y/o female felt like she would have a seizure but did not.

## 2012-04-08 NOTE — ED Notes (Signed)
Per ems pt has hx of seizures. Has not had seizure in 1 year. Pt Reports today that "she felt like she was going to have a seizure". Did not give more details about reasoning. Pt reports chronic back pain as well, pain not new, but pt has not been able to get back pain checked out yet.

## 2012-04-08 NOTE — ED Notes (Signed)
Pt requesting something for a headache. 

## 2012-04-08 NOTE — ED Provider Notes (Signed)
History     CSN: [id number]  Arrival date & time 04/08/12  1449   First MD Initiated Contact with Patient 04/08/12 1510      Chief Complaint  Patient presents with  . hx of seizures, preseizure s/s     (Consider location/radiation/quality/duration/timing/severity/associated sxs/prior treatment) HPI Comments: 21 y/o woman with hx of seizures comes in with cc of seizure aura. Pt reports that she hasn't had a seizure in 1 year, and has been taking her keppra as prescribed. She has no recent infections, no n/v/f/c/uti like symptoms and is not pregnant. No trauma. She is having some left sided flank pain. Michaell Cowing is described as just "feeling like she will have a seizure anytime."  The history is provided by the patient.    Past Medical History  Diagnosis Date  . Ovarian cyst   . Seizures     Last seizure 5 months ago  . Abortion history     Past Surgical History  Procedure Date  . Dilation and curettage of uterus     abortion    Family History  Problem Relation Age of Onset  . Asthma Brother     History  Substance Use Topics  . Smoking status: Current Every Day Smoker -- 0.2 packs/day    Types: Cigarettes    Last Attempt to Quit: 04/22/2011  . Smokeless tobacco: Never Used  . Alcohol Use: No    OB History    Grav Para Term Preterm Abortions TAB SAB Ect Mult Living   4 0 0 0 4 1 3 0 0 0       Review of Systems  Constitutional: Negative for activity change.  HENT: Negative for facial swelling and neck pain.   Respiratory: Negative for cough, shortness of breath and wheezing.   Cardiovascular: Negative for chest pain.  Gastrointestinal: Negative for nausea, vomiting, abdominal pain, diarrhea, constipation, blood in stool and abdominal distention.  Genitourinary: Negative for dysuria, hematuria and difficulty urinating.  Skin: Negative for color change.  Neurological: Negative for seizures, speech difficulty and headaches.  Hematological: Does not bruise/bleed  easily.  Psychiatric/Behavioral: Negative for confusion.    Allergies  Review of patient's allergies indicates no known allergies.  Home Medications   Current Outpatient Rx  Name Route Sig Dispense Refill  . ACETAMINOPHEN 325 MG PO TABS Oral Take 325 mg by mouth every 6 (six) hours as needed. Pain.    [name] FERROUS SULFATE 325 (65 FE) MG PO TABS Oral Take 1 tablet (325 mg total) by mouth daily. 30 tablet 0  . LEVETIRACETAM 500 MG PO TABS Oral Take 500 mg by mouth 2 (two) times daily.     [name] NAPROXEN SODIUM 220 MG PO TABS Oral Take 220 mg by mouth 2 (two) times daily with a meal.    . NORGESTIMATE-ETH ESTRADIOL 0.25-35 MG-MCG PO TABS Oral Take 1 tablet by mouth daily. 1 Package 11    BP 98/54  Pulse 50  Temp 98.4 F (36.9 C) (Oral)  Resp 16  SpO2 98%  Breastfeeding? Unknown  Physical Exam  Constitutional: She is oriented to person, place, and time. She appears well-developed and well-nourished.  HENT:  Head: Normocephalic and atraumatic.  Eyes: EOM are normal. Pupils are equal, round, and reactive to light.  Neck: Neck supple.  Cardiovascular: Normal rate, regular rhythm and normal heart sounds.   No murmur heard. Pulmonary/Chest: Effort normal. No respiratory distress.  Abdominal: Soft. She exhibits no distension. There is no tenderness. There is no rebound and  no guarding.  Genitourinary:       Left flank tenderness  Neurological: She is alert and oriented to person, place, and time.  Skin: Skin is warm and dry.    ED Course  Procedures (including critical care time)   Labs Reviewed  GLUCOSE, CAPILLARY  URINALYSIS, ROUTINE W REFLEX MICROSCOPIC  PREGNANCY, URINE  BASIC METABOLIC PANEL   No results found.   No diagnosis found.    MDM  DDx: -Seizure disorder -Meningitis -Trauma -ICH -Electrolyte abnormality -Metabolic derangement -Stroke -Toxin induced seizures -Medication side effects -Hypoxia -Hypoglycemia   Pt comes in with cc of seizure aura. She  is taking her keppra. No seizures yet. We will get a basic screen to see if there is anything reversible we can treat/fix. ROs is + for flank pain. No uti like sx, no n/v/f/c. We will check UA and Upreg. No concerns for renal stones  -this pain is mild and brought on only on ROS.        Derwood Kaplan, MD 04/08/12 [phone]

## 2012-04-14 ENCOUNTER — Emergency Department (HOSPITAL_COMMUNITY): Payer: Medicaid Other

## 2012-04-14 ENCOUNTER — Emergency Department (HOSPITAL_COMMUNITY)
Admission: EM | Admit: 2012-04-14 | Discharge: 2012-04-14 | Disposition: A | Discharge status: Home / Self Care | Payer: Medicaid Other | Attending: Emergency Medicine | Admitting: Emergency Medicine

## 2012-04-14 ENCOUNTER — Encounter (HOSPITAL_COMMUNITY): Payer: Self-pay | Admitting: Emergency Medicine

## 2012-04-14 DIAGNOSIS — R569 Unspecified convulsions: Secondary | ICD-10-CM | POA: Insufficient documentation

## 2012-04-14 LAB — URINALYSIS, ROUTINE W REFLEX MICROSCOPIC
Bilirubin Urine: NEGATIVE
Glucose, UA: NEGATIVE mg/dL
Ketones, ur: NEGATIVE mg/dL
Nitrite: NEGATIVE
Protein, ur: NEGATIVE mg/dL
pH: 7 (ref 5.0–8.0)

## 2012-04-14 MED ORDER — ACETAMINOPHEN 325 MG PO TABS
650.0000 mg | ORAL_TABLET | Freq: Once | ORAL | Status: AC
  Administered 2012-04-14: 650 mg via ORAL
  Filled 2012-04-14: qty 2

## 2012-04-14 NOTE — ED Notes (Signed)
WUX:[id number]<WN> Expected date:04/14/12<BR> Expected time:<BR> Means of arrival:<BR> Comments:<BR> 20 y/o seizure -- witnessed, hit head. c-collar/backboard

## 2012-04-14 NOTE — ED Notes (Addendum)
Pt stated she only remembers going to work this morning at 1100 and felt "twitchy" but did not think she was going to have a seizure.  Pt has not eaten since last night.  Pt had witnessed seizure, and fell hitting head on table.  Pt reports no head pain, only back pain 10/10 which she states started before her seizure.  Pt poor historian, NAD noted at this time.

## 2012-04-14 NOTE — ED Notes (Signed)
Pt reports she remember feeling like she was falling and her manager caught her and feeling her body jumping then is unable to remember further events of the day.  Pt has history of seizures prior today. Pt last seizure was several weeks ago.  Pt denies changes in Keppra dosage, but has not been taking her Keppra twice a day as instructed.  Pt reports taking her medication this AM.  Pt denies pain at this time.  Pt alert and oriented and moving all extremities.

## 2012-04-14 NOTE — ED Notes (Signed)
Per EMS, pt had a witnessed seizure at work and hit her head on a table.  Pt initially told EMS she had not taken her Sheralyn Boatman, then stated she took it this morning.  Pt also initially told EMS that she had not had a seizure in 3-4 weeks, then stated she had recently had one. Pt noted to be tearful, asking to call her mother.

## 2012-04-14 NOTE — ED Notes (Signed)
Pt is currently resting quietly in bed.  Equal chest rise and fall bilaterally, NAD noted at this time.

## 2012-04-14 NOTE — ED Notes (Signed)
Pt removed from LSB.  C-collar placed on patient.  Pt denies pain to palpation of spine, but reports paraspinal pain to right lower back.  c-spine protected during movement. Pt in no apparent distress. No deformity or pain noted along spine.

## 2012-04-14 NOTE — ED Provider Notes (Signed)
History     CSN: [id number]  Arrival date & time 04/14/12  1343   First MD Initiated Contact with Patient 04/14/12 1505      Chief Complaint  Patient presents with  . Seizures    (Consider location/radiation/quality/duration/timing/severity/associated sxs/prior treatment) HPI Comments: The patient is a 21 year old woman with a history of seizure disorder. She had a seizure while at work. She wa therefore brought to [facility] long ED for evaluation by EMS. She is followed for seizure disorder at [facility] neurological. She is on Keppra 1 g twice a day. Her last seizure was she says was last week.   Patient is a 21 y.o. female presenting with seizures. The history is provided by the patient and medical records. No language interpreter was used.  Seizures  This is a recurrent problem. The current episode started less than 1 hour ago. The problem has been rapidly improving. There was 1 seizure. Duration: Unknown duration. Associated symptoms include headaches. Characteristics include rhythmic jerking. The episode was witnessed. The seizures did not continue in the ED. The seizure(s) had no focality. There has been no fever.    Past Medical History  Diagnosis Date  . Ovarian cyst   . Seizures     Last seizure 5 months ago  . Abortion history     Past Surgical History  Procedure Date  . Dilation and curettage of uterus     abortion    Family History  Problem Relation Age of Onset  . Asthma Brother     History  Substance Use Topics  . Smoking status: Current Every Day Smoker -- 0.5 packs/day for 5 years    Types: Cigarettes    Last Attempt to Quit: 04/22/2011  . Smokeless tobacco: Never Used  . Alcohol Use: Yes     occ.    OB History    Grav Para Term Preterm Abortions TAB SAB Ect Mult Living   4 0 0 0 4 1 3 0 0 0       Review of Systems  Constitutional: Negative for fever and chills.  HENT:       Did not bite tongue.  Eyes: Negative.   Respiratory: Negative.     Cardiovascular: Negative.   Gastrointestinal: Negative.   Genitourinary: Negative.   Musculoskeletal: Negative.   Neurological: Positive for seizures and headaches.  Psychiatric/Behavioral: Negative.     Allergies  Review of patient's allergies indicates no known allergies.  Home Medications   Current Outpatient Rx  Name Route Sig Dispense Refill  . ACETAMINOPHEN 325 MG PO TABS Oral Take 325 mg by mouth every 6 (six) hours as needed. Pain.    [name] LEVETIRACETAM 500 MG PO TABS Oral Take 500 mg by mouth 2 (two) times daily.       BP 98/62  Pulse 79  Temp 98.5 F (36.9 C) (Oral)  Resp 18  Wt 212 lb (96.163 kg)  SpO2 98%  LMP 03/30/2012  Physical Exam  Nursing note and vitals reviewed. Constitutional: She is oriented to person, place, and time. She appears well-developed and well-nourished.       In cervical collar.  HENT:  Head: Normocephalic and atraumatic.  Right Ear: External ear normal.  Left Ear: External ear normal.  Mouth/Throat: Oropharynx is clear and moist.  Eyes: Conjunctivae normal and EOM are normal. Pupils are equal, round, and reactive to light.  Neck: Normal range of motion. Neck supple.       No bony deformity or point tenderness of the  neck.  C-collar removed by me.  Cardiovascular: Normal rate, regular rhythm and normal heart sounds.   Pulmonary/Chest: Effort normal and breath sounds normal.  Abdominal: Soft. Bowel sounds are normal.  Musculoskeletal: Normal range of motion. She exhibits no edema and no tenderness.  Neurological: She is alert and oriented to person, place, and time. She has normal reflexes.  Skin: Skin is warm and dry.  Psychiatric: She has a normal mood and affect. Her behavior is normal.    ED Course  Procedures (including critical care time)  3:16 PM Pt was seen and had physical examination.  She is on Keppra 1 gram po bid, as prescribed by [facility].  Advised we would observe her to see if she had further  seizures.  5:26 PM Results for orders placed during the hospital encounter of 04/14/12  URINALYSIS, ROUTINE W REFLEX MICROSCOPIC      Component Value Range   Color, Urine YELLOW  YELLOW   APPearance CLOUDY (*) CLEAR   Specific Gravity, Urine 1.019  1.005 - 1.030   pH 7.0  5.0 - 8.0   Glucose, UA NEGATIVE  NEGATIVE mg/dL   Hgb urine dipstick NEGATIVE  NEGATIVE   Bilirubin Urine NEGATIVE  NEGATIVE   Ketones, ur NEGATIVE  NEGATIVE mg/dL   Protein, ur NEGATIVE  NEGATIVE mg/dL   Urobilinogen, UA 0.2  0.0 - 1.0 mg/dL   Nitrite NEGATIVE  NEGATIVE   Leukocytes, UA NEGATIVE  NEGATIVE   Dg Lumbar Spine Complete  04/14/2012  *RADIOLOGY REPORT*  Clinical Data: History of fall complaining of lower back pain.  LUMBAR SPINE - COMPLETE 4+ VIEW  Comparison: No priors  Findings: Multiple views of the lumbar spine demonstrate no acute displaced fracture or definite compression type fracture. Alignment is anatomic.  IMPRESSION: 1.  No acute radiographic abnormality of the lumbar spine to account for the patient's symptoms.   Original Report Authenticated By: Florencia Reasons, M.D.     Lab tests were negative.  She did not want C-spine films, did not low back pain, so LS spine films were done that were negative.  No further seizures.  Released.  She is to continue on Keppra 1000 mg bid.   1. Seizure         Carleene Cooper III, MD 04/14/12 1729

## 2012-05-05 ENCOUNTER — Emergency Department (HOSPITAL_COMMUNITY)
Admission: EM | Admit: 2012-05-05 | Discharge: 2012-05-05 | Disposition: A | Discharge status: Home / Self Care | Payer: Medicaid Other | Attending: Emergency Medicine | Admitting: Emergency Medicine

## 2012-05-05 DIAGNOSIS — R569 Unspecified convulsions: Secondary | ICD-10-CM | POA: Insufficient documentation

## 2012-05-05 DIAGNOSIS — F17200 Nicotine dependence, unspecified, uncomplicated: Secondary | ICD-10-CM | POA: Insufficient documentation

## 2012-05-05 LAB — CBC
HCT: 35.9 % — ABNORMAL LOW (ref 36.0–46.0)
Hemoglobin: 12.2 g/dL (ref 12.0–15.0)
MCH: 30 pg (ref 26.0–34.0)
MCHC: 34 g/dL (ref 30.0–36.0)
MCV: 88.4 fL (ref 78.0–100.0)

## 2012-05-05 LAB — BASIC METABOLIC PANEL
BUN: 6 mg/dL (ref 6–23)
Chloride: 105 mEq/L (ref 96–112)
Glucose, Bld: 100 mg/dL — ABNORMAL HIGH (ref 70–99)
Potassium: 3.6 mEq/L (ref 3.5–5.1)

## 2012-05-05 LAB — URINALYSIS, ROUTINE W REFLEX MICROSCOPIC
Bilirubin Urine: NEGATIVE
Specific Gravity, Urine: 1.024 (ref 1.005–1.030)
pH: 6 (ref 5.0–8.0)

## 2012-05-05 NOTE — ED Provider Notes (Signed)
History     CSN: [id number]  Arrival date & time 05/05/12  1053   First MD Initiated Contact with Patient 05/05/12 1108      Chief Complaint  Patient presents with  . Seizures    (Consider location/radiation/quality/duration/timing/severity/associated sxs/prior treatment) HPI Comments: Patient is a 21 year old female with a history of seizures currently being followed by [facility] neurology.  She presents the emergency department after an episode that was witnessed by her brother and mother this morning.  Episode lasted approximately 5 minutes and was described as tonic-clonic shaking with eyes rolling back.  There was no loss of bowel or bladder incontinence.  Patient denies having a previous aura and states that she is post ictal with a mild headache.  She is currently taking Keppra 1000 mg twice a day and states that she is compliant on this medication.  She reports last week having an aura but not developing a seizure.  Last full seizure was approximately 2-3 weeks ago.  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  She has a followup evaluation appointment next week.  Patient reports increased stress and decreased sleep recently.  No other complaints at this time.  Patient is a 21 y.o. female presenting with seizures. The history is provided by the patient.  Seizures  Pertinent negatives include patient does not experience confusion, no headaches, no visual disturbance and no chest pain.    Past Medical History  Diagnosis Date  . Ovarian cyst   . Seizures     Last seizure 5 months ago  . Abortion history     Past Surgical History  Procedure Date  . Dilation and curettage of uterus     abortion    Family History  Problem Relation Age of Onset  . Asthma Brother     History  Substance Use Topics  . Smoking status: Current Every Day Smoker -- 0.5 packs/day for 5 years    Types: Cigarettes    Last Attempt to Quit: 04/22/2011  . Smokeless tobacco: Never Used  .  Alcohol Use: Yes     occ.    OB History    Grav Para Term Preterm Abortions TAB SAB Ect Mult Living   4 0 0 0 4 1 3 0 0 0       Review of Systems  Constitutional: Negative for fever, chills and appetite change.  HENT: Negative for congestion.   Eyes: Negative for visual disturbance.  Respiratory: Negative for shortness of breath.   Cardiovascular: Negative for chest pain and leg swelling.  Gastrointestinal: Negative for abdominal pain.  Genitourinary: Negative for dysuria, urgency and frequency.  Skin: Negative for rash.  Neurological: Positive for seizures. Negative for dizziness, syncope, weakness, light-headedness, numbness and headaches.  Psychiatric/Behavioral: Negative for confusion.  All other systems reviewed and are negative.    Allergies  Review of patient's allergies indicates no known allergies.  Home Medications   Current Outpatient Rx  Name Route Sig Dispense Refill  . IBUPROFEN 200 MG PO TABS Oral Take 200 mg by mouth every 6 (six) hours as needed. Pain    . LEVETIRACETAM 500 MG PO TABS Oral Take 500 mg by mouth 2 (two) times daily.       BP 110/67  Pulse 82  Temp 98.2 F (36.8 C) (Oral)  Resp 22  SpO2 99%  LMP 03/30/2012  Physical Exam  Nursing note and vitals reviewed. Constitutional: She appears well-developed and well-nourished. No distress.  HENT:  Head: Normocephalic.  Atraumatic, No evidence of tongue oral lacerations  Eyes: EOM are normal. Pupils are equal, round, and reactive to light.  Neck: Normal range of motion. Neck supple.       Cervical spinous process non tender without step offs, no difficulty or pain with flexion or extension of neck  Cardiovascular: Normal rate, regular rhythm, normal heart sounds and intact distal pulses.   Pulmonary/Chest: Breath sounds normal. No respiratory distress. She has no wheezes. She has no rales.  Abdominal: Soft. There is no tenderness.  Musculoskeletal: She exhibits no edema and no  tenderness.       Full active & passive ROM of arms bilaterally  Neurological:       CN III-VII intact. Iintact coordination, sensation, and motor (finger grip, biceps, hamstrings & dorsiflexion). No pass pointing, good rapid coordination. Gait normal.   Skin: Skin is warm and dry. She is not diaphoretic.       intact    ED Course  Procedures (including critical care time)  Labs Reviewed  CBC - Abnormal; Notable for the following:    HCT 35.9 (*)     All other components within normal limits  URINALYSIS, ROUTINE W REFLEX MICROSCOPIC  BASIC METABOLIC PANEL   No results found.   No diagnosis found.  The patient denies any neck pain. There is no tenderness on palpation of the cervical spine and no step-offs. The patient can look to the left and right voluntarily without pain and flex and extend the neck without pain. Cervical collar cleared.   MDM  Seizure  Patient with no evidence of focal neuro deficits on physical exam and is at mental baseline.  Labs and imaging have been reviewed.  Patient is advised to followup with neurologist in regards to today's event.  Spoke with patient  in detail about driving restrictions until cleared by a neurologist.  Patient verbalizes understanding.  Answered all questions.  Patient is hemodynamically stable and in no acute distress prior to discharge.           Jaci Carrel, [place] 05/05/12 1323

## 2012-05-05 NOTE — ED Notes (Signed)
Patient's family witnessed her having a seizure this am. Pt states her last seizure was one month ago.  [facility] reported blood sugar 117. Family member states the seizure lasted one minute per EMS. Pt is alert and oriented and answers questions appropriately.

## 2012-05-05 NOTE — ED Provider Notes (Signed)
Medical screening examination/treatment/procedure(s) were performed by non-physician practitioner and as supervising physician I was immediately available for consultation/collaboration.   Celene Kras, MD 05/05/12 [phone]

## 2012-05-05 NOTE — ED Notes (Signed)
ZOX:[id number]<AV> Expected date:05/05/12<BR> Expected time:10:50 AM<BR> Means of arrival:Ambulance<BR> Comments:<BR> seizure

## 2012-05-14 ENCOUNTER — Encounter (HOSPITAL_COMMUNITY): Payer: Self-pay

## 2012-05-14 ENCOUNTER — Emergency Department (HOSPITAL_COMMUNITY)
Admission: EM | Admit: 2012-05-14 | Discharge: 2012-05-14 | Disposition: A | Discharge status: Home / Self Care | Payer: Medicaid Other | Attending: Emergency Medicine | Admitting: Emergency Medicine

## 2012-05-14 DIAGNOSIS — F17200 Nicotine dependence, unspecified, uncomplicated: Secondary | ICD-10-CM | POA: Insufficient documentation

## 2012-05-14 DIAGNOSIS — G40909 Epilepsy, unspecified, not intractable, without status epilepticus: Secondary | ICD-10-CM

## 2012-05-14 MED ORDER — LEVETIRACETAM 500 MG PO TABS
500.0000 mg | ORAL_TABLET | Freq: Once | ORAL | Status: AC
  Administered 2012-05-14: 500 mg via ORAL
  Filled 2012-05-14: qty 1

## 2012-05-14 NOTE — ED Provider Notes (Signed)
History     CSN: [id number]  Arrival date & time 05/14/12  1303   First MD Initiated Contact with Patient 05/14/12 1312      Chief Complaint  Patient presents with  . Seizures    (Consider location/radiation/quality/duration/timing/severity/associated sxs/prior treatment) HPI Patient had a seizure today, lasting unknown amount of time, witnessed by companions who were driving her work. She is presently asymptomatic she admits to noncompliance with Keppra, coming missing one dose 2 days ago. Feels at baseline presently. No treatment prior to coming here. Brought by EMS Past Medical History  Diagnosis Date  . Ovarian cyst   . Seizures     Last seizure 5 months ago  . Abortion history     Past Surgical History  Procedure Date  . Dilation and curettage of uterus     abortion    Family History  Problem Relation Age of Onset  . Asthma Brother     History  Substance Use Topics  . Smoking status: Current Every Day Smoker -- 0.5 packs/day for 5 years    Types: Cigarettes    Last Attempt to Quit: 04/22/2011  . Smokeless tobacco: Never Used  . Alcohol Use: Yes     occ.    OB History    Grav Para Term Preterm Abortions TAB SAB Ect Mult Living   4 0 0 0 4 1 3 0 0 0       Review of Systems  Constitutional: Negative.   HENT: Negative.   Respiratory: Negative.   Cardiovascular: Negative.   Gastrointestinal: Negative.   Musculoskeletal: Negative.   Skin: Negative.   Neurological: Positive for seizures.  Hematological: Negative.   Psychiatric/Behavioral: Negative.   All other systems reviewed and are negative.    Allergies  Review of patient's allergies indicates no known allergies.  Home Medications   Current Outpatient Rx  Name Route Sig Dispense Refill  . IBUPROFEN 200 MG PO TABS Oral Take 200 mg by mouth every 6 (six) hours as needed. Pain    . LEVETIRACETAM 500 MG PO TABS Oral Take 500 mg by mouth 2 (two) times daily.       BP 106/60  Pulse 89  Temp  98.1 F (36.7 C) (Oral)  Resp 16  SpO2 99%  Physical Exam  Nursing note and vitals reviewed. Constitutional: She appears well-developed and well-nourished.  HENT:  Head: Normocephalic and atraumatic.  Eyes: Conjunctivae normal are normal. Pupils are equal, round, and reactive to light.  Neck: Neck supple. No tracheal deviation present. No thyromegaly present.  Cardiovascular: Normal rate and regular rhythm.   No murmur heard. Pulmonary/Chest: Effort normal and breath sounds normal.  Abdominal: Soft. Bowel sounds are normal. She exhibits no distension. There is no tenderness.       Obese  Musculoskeletal: Normal range of motion. She exhibits no edema and no tenderness.  Neurological: She is alert. She has normal reflexes. Coordination normal.       Gait normal  Skin: Skin is warm and dry. No rash noted.  Psychiatric: She has a normal mood and affect.    ED Course  Procedures (including critical care time)  Labs Reviewed - No data to display No results found.   No diagnosis found.   Date: 05/14/2012  Rate: 94  Rhythm: normal sinus rhythm  QRS Axis: normal  Intervals: normal  ST/T Wave abnormalities: normal  Conduction Disutrbances: none  Narrative Interpretation: unremarkable Unchanged from 02/13/2010, interpreted by me      MDM  Seizure felt secondary to noncompliance with antiepileptic medication. Patient encouraged to take Keppra as prescribed. Followup with Dr.Yan as needed Diagnosis #1 seizure disorder #2 medication noncompliance        Doug Sou, MD 05/14/12 1436

## 2012-05-14 NOTE — ED Notes (Signed)
Per EMS- Patient had a witnessed seizure that lasted approximately 30 seconds to 1 minute. Patient was a passenger in a car when the seizure occurred. Patient told EMS that she has not been compliant with taking her prescribed Keppra.

## 2012-05-14 NOTE — ED Notes (Signed)
Bed:RESB<BR> Expected date:<BR> Expected time:<BR> Means of arrival:<BR> Comments:<BR> seizure

## 2012-08-27 ENCOUNTER — Inpatient Hospital Stay (HOSPITAL_COMMUNITY)
Admission: AD | Admit: 2012-08-27 | Discharge: 2012-08-27 | Disposition: A | Discharge status: Home / Self Care | Payer: Self-pay | Source: Ambulatory Visit | Attending: Obstetrics & Gynecology | Admitting: Obstetrics & Gynecology

## 2012-08-27 ENCOUNTER — Encounter (HOSPITAL_COMMUNITY): Payer: Self-pay

## 2012-08-27 DIAGNOSIS — Z3201 Encounter for pregnancy test, result positive: Secondary | ICD-10-CM | POA: Insufficient documentation

## 2012-08-27 LAB — POCT PREGNANCY, URINE: Preg Test, Ur: POSITIVE — AB

## 2012-08-27 LAB — URINALYSIS, ROUTINE W REFLEX MICROSCOPIC
Ketones, ur: NEGATIVE mg/dL
Leukocytes, UA: NEGATIVE
Nitrite: NEGATIVE
pH: 7.5 (ref 5.0–8.0)

## 2012-08-27 NOTE — MAU Note (Signed)
Patient states she has missed her period and wants a pregnancy test. Denies any bleeding or pain at this time. Has occasional cramping.

## 2012-08-27 NOTE — MAU Note (Signed)
Patient states she has urinary frequency.

## 2012-08-27 NOTE — MAU Provider Note (Signed)
Brittany Hendrix is a 22 y.o. female who presents to MAU for pregnancy verification. She reports frequent urination and amenorrhea.   Results for orders placed during the hospital encounter of 08/27/12 (from the past 24 hour(s))  URINALYSIS, ROUTINE W REFLEX MICROSCOPIC     Status: Normal   Collection Time   08/27/12 12:20 PM      Component Value Range   Color, Urine YELLOW  YELLOW   APPearance CLEAR  CLEAR   Specific Gravity, Urine 1.020  1.005 - 1.030   pH 7.5  5.0 - 8.0   Glucose, UA NEGATIVE  NEGATIVE mg/dL   Hgb urine dipstick NEGATIVE  NEGATIVE   Bilirubin Urine NEGATIVE  NEGATIVE   Ketones, ur NEGATIVE  NEGATIVE mg/dL   Protein, ur NEGATIVE  NEGATIVE mg/dL   Urobilinogen, UA 0.2  0.0 - 1.0 mg/dL   Nitrite NEGATIVE  NEGATIVE   Leukocytes, UA NEGATIVE  NEGATIVE  POCT PREGNANCY, URINE     Status: Abnormal   Collection Time   08/27/12 12:23 PM      Component Value Range   Preg Test, Ur POSITIVE (*) NEGATIVE    Assessment: 22 y.o. female @ [redacted]w[redacted]d gestation   Plan:  Pregnancy verification letter   Start prenatal care and vitamins, return as needed for problems.

## 2012-08-27 NOTE — MAU Provider Note (Signed)
Attestation of Attending Supervision of Advanced Practitioner (PA/CNM/NP): Evaluation and management procedures were performed by the Advanced Practitioner under my supervision and collaboration.  I have reviewed the Advanced Practitioner's note and chart, and I agree with the management and plan.  Lyncoln Maskell, MD, FACOG Attending Obstetrician & Gynecologist Faculty Practice, [facility]  

## 2012-09-03 ENCOUNTER — Inpatient Hospital Stay (HOSPITAL_COMMUNITY)
Admission: AD | Admit: 2012-09-03 | Discharge: 2012-09-03 | Disposition: A | Discharge status: Home / Self Care | Payer: Self-pay | Source: Ambulatory Visit | Attending: Obstetrics & Gynecology | Admitting: Obstetrics & Gynecology

## 2012-09-03 ENCOUNTER — Encounter (HOSPITAL_COMMUNITY): Payer: Self-pay | Admitting: *Deleted

## 2012-09-03 ENCOUNTER — Inpatient Hospital Stay (HOSPITAL_COMMUNITY): Payer: Self-pay

## 2012-09-03 DIAGNOSIS — N760 Acute vaginitis: Secondary | ICD-10-CM | POA: Insufficient documentation

## 2012-09-03 DIAGNOSIS — B9689 Other specified bacterial agents as the cause of diseases classified elsewhere: Secondary | ICD-10-CM | POA: Insufficient documentation

## 2012-09-03 DIAGNOSIS — A499 Bacterial infection, unspecified: Secondary | ICD-10-CM | POA: Insufficient documentation

## 2012-09-03 DIAGNOSIS — O99330 Smoking (tobacco) complicating pregnancy, unspecified trimester: Secondary | ICD-10-CM | POA: Insufficient documentation

## 2012-09-03 DIAGNOSIS — O2390 Unspecified genitourinary tract infection in pregnancy, unspecified trimester: Secondary | ICD-10-CM | POA: Insufficient documentation

## 2012-09-03 DIAGNOSIS — R109 Unspecified abdominal pain: Secondary | ICD-10-CM | POA: Insufficient documentation

## 2012-09-03 HISTORY — DX: Headache: R51

## 2012-09-03 LAB — CBC
MCH: 30.3 pg (ref 26.0–34.0)
MCV: 91.3 fL (ref 78.0–100.0)
Platelets: 263 10*3/uL (ref 150–400)
RDW: 12.5 % (ref 11.5–15.5)
WBC: 9.5 10*3/uL (ref 4.0–10.5)

## 2012-09-03 LAB — URINALYSIS, ROUTINE W REFLEX MICROSCOPIC
Bilirubin Urine: NEGATIVE
Ketones, ur: NEGATIVE mg/dL
Nitrite: NEGATIVE
Specific Gravity, Urine: 1.015 (ref 1.005–1.030)
Urobilinogen, UA: 0.2 mg/dL (ref 0.0–1.0)

## 2012-09-03 MED ORDER — METRONIDAZOLE 500 MG PO TABS: 250.0000 mg | ORAL_TABLET | Freq: Three times a day (TID) | ORAL | Status: DC

## 2012-09-03 MED ORDER — METRONIDAZOLE 500 MG PO TABS: 500.0000 mg | ORAL_TABLET | Freq: Two times a day (BID) | ORAL | Status: DC

## 2012-09-03 NOTE — MAU Provider Note (Signed)
History     CSN: [id number]  Arrival date and time: 09/03/12 1218   First Provider Initiated Contact with Patient 09/03/12 1437     Chief Complaint  Patient presents with  . Abdominal Pain   HPI  AARINI Hendrix is a 22 y.o. female G59P0030 presenting today with cramping lower abdominal pain. The pain has been present since the end of January when she was supposed to be having her menstrual period. Patient's last menstrual period was 07/24/2012. She states the pain comes and goes and is normally localized to her right side of her pelvis. She describes it as a cramping pain. The pain does not radiate, but she states the pain may shift to the left side of her pelvis. 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 Does not use protection during sexual intercourse. She endorses some white vaginal discharge, but no bloody discharge. She denies itching or burning of her vagina. No fever, chills, dysuria, hematuria, nausea, back pain.   OB History   Grav Para Term Preterm Abortions TAB SAB Ect Mult Living   4 0 0 0 3 1 2 0 0 0      Past Medical History  Diagnosis Date  . Ovarian cyst   . Seizures     Last seizure 5 months ago  . Abortion history   . Headache     Past Surgical History  Procedure Laterality Date  . Dilation and curettage of uterus      abortion    Family History  Problem Relation Age of Onset  . Asthma Brother    History  Substance Use Topics  . Smoking status: Current Every Day Smoker -- 0.50 packs/day for 5 years    Types: Cigarettes    Last Attempt to Quit: 04/22/2011  . Smokeless tobacco: Never Used  . Alcohol Use: No   Allergies: No Known Allergies  Prescriptions prior to admission  Medication Sig Dispense Refill  . acetaminophen (TYLENOL) 500 MG tablet Take 500 mg by mouth every 8 (eight) hours as needed for pain. For pain      . levETIRAcetam (KEPPRA) 500 MG tablet Take  500 mg by mouth 2 (two) times daily.        ROS Physical Exam   Blood pressure 122/62, pulse 71, temperature 98.4 F (36.9 C), temperature source Oral, resp. rate 16, last menstrual period 07/24/2012, SpO2 99.00%.  Physical Exam  General appearance: alert, cooperative and mild distress Back: symmetric, no curvature. ROM normal. No CVA tenderness. Abdomen: soft, non-tender; bowel sounds normal; no masses,  no organomegaly Pelvic: cervix normal in appearance, external genitalia normal and no cervical motion tenderness, no adnexal masses palpable, but tenderness bilaterally. Vagina normal with slight amount of white/frothy discharge.   Extremities: extremities normal, atraumatic, no cyanosis or edema  MAU Course  Procedures  Results for orders placed during the hospital encounter of 09/03/12 (from the past 24 hour(s))  URINALYSIS, ROUTINE W REFLEX MICROSCOPIC     Status: None   Collection Time    09/03/12 12:36 PM      Result Value Range   Color, Urine YELLOW  YELLOW   APPearance CLEAR  CLEAR   Specific Gravity, Urine 1.015  1.005 - 1.030   pH 6.5  5.0 - 8.0   Glucose, UA NEGATIVE  NEGATIVE mg/dL   Hgb urine dipstick NEGATIVE  NEGATIVE   Bilirubin Urine NEGATIVE  NEGATIVE  Ketones, ur NEGATIVE  NEGATIVE mg/dL   Protein, ur NEGATIVE  NEGATIVE mg/dL   Urobilinogen, UA 0.2  0.0 - 1.0 mg/dL   Nitrite NEGATIVE  NEGATIVE   Leukocytes, UA NEGATIVE  NEGATIVE  HCG, QUANTITATIVE, PREGNANCY     Status: Abnormal   Collection Time    09/03/12  2:48 PM      Result Value Range   hCG, Beta Chain, Quant, S 703 (*) <5 mIU/mL  CBC     Status: Abnormal   Collection Time    09/03/12  2:48 PM      Result Value Range   WBC 9.5  4.0 - 10.5 K/uL   RBC 3.90  3.87 - 5.11 MIL/uL   Hemoglobin 11.8 (*) 12.0 - 15.0 g/dL   HCT 44.0 (*) 10.2 - 72.5 %   MCV 91.3  78.0 - 100.0 fL   MCH 30.3  26.0 - 34.0 pg   MCHC 33.1  30.0 - 36.0 g/dL   RDW 36.6  44.0 - 34.7 %   Platelets 263  150 - 400 K/uL  WET  PREP, GENITAL     Status: Abnormal   Collection Time    09/03/12  3:26 PM      Result Value Range   Yeast Wet Prep HPF POC NONE SEEN  NONE SEEN   Trich, Wet Prep NONE SEEN  NONE SEEN   Clue Cells Wet Prep HPF POC MODERATE (*) NONE SEEN   WBC, Wet Prep HPF POC FEW (*) NONE SEEN   Ultrasound Results: Pending . . .    Assessment and Plan   UA - normal Wet Prep - moderate amount of clue cells. Will treat with flagyl 250mg  q8h. [place] - Pending . Catalina Gravel, Adam PA-S 09/03/2012, 4:03 PM   I saw and examined patient along with student and agree with above note.   Silas Sedam 09/03/2012 6:07 PM  US Ob Comp Less 14 Wks  09/03/2012  *RADIOLOGY REPORT*  Clinical Data: early preg, abdom pain; ;  OBSTETRIC <14 WK [place] AND TRANSVAGINAL OB US  Technique: Both transabdominal and transvaginal ultrasound examinations were performed for complete evaluation of the gestation as well as the maternal uterus, adnexal regions, and pelvic cul-de-sac.  Comparison: None.  Findings: There is a single intrauterine gestational sac.  Based on mean sac diameter, estimated gestational age is 4 weeks 6 days.  No visible yolk sac or embryo currently.  No subchorionic hemorrhage.  Ovaries are symmetric in size and echotexture.  No adnexal masses. Small amount of free fluid in the cul-de-sac.  IMPRESSION: Single early intrauterine gestational sac.  No visible embryo or yolk sac currently.  Estimated gestational age by mean sac diameter is 4 weeks 6 days.  Recommend follow-up ultrasound in 10-14 days.   Original Report Authenticated By: Charlett Nose, M.D.    US Ob Transvaginal  09/03/2012  *RADIOLOGY REPORT*  Clinical Data: early preg, abdom pain; ;  OBSTETRIC <14 WK [place] AND TRANSVAGINAL OB US  Technique: Both transabdominal and transvaginal ultrasound examinations were performed for complete evaluation of the gestation as well as the maternal uterus, adnexal regions, and pelvic cul-de-sac.  Comparison: None.  Findings: There is  a single intrauterine gestational sac.  Based on mean sac diameter, estimated gestational age is 4 weeks 6 days.  No visible yolk sac or embryo currently.  No subchorionic hemorrhage.  Ovaries are symmetric in size and echotexture.  No adnexal masses. Small amount of free fluid in the cul-de-sac.  IMPRESSION: Single early intrauterine gestational sac.  No visible embryo or yolk sac currently.  Estimated gestational age by mean sac diameter is 4 weeks 6 days.  Recommend follow-up ultrasound in 10-14 days.   Original Report Authenticated By: Charlett Nose, M.D.    A/P: 1. BV (bacterial vaginosis)   2. Abdominal pain in pregnancy, antepartum    Rx: Flagyl 500 mg 1 po bid x 7 days  Follow-up Information   Follow up with [facility] On 09/06/2012. (for repeat labs)    Contact information:   [address] [id number] [place] [address] [phone]

## 2012-09-03 NOTE — MAU Provider Note (Signed)
Attestation of Attending Supervision of Advanced Practitioner (CNM/NP): Evaluation and management procedures were performed by the Advanced Practitioner under my supervision and collaboration. I have reviewed the Advanced Practitioner's note and chart, and I agree with the management and plan.  Arrie Borrelli H. 7:42 PM   

## 2012-09-03 NOTE — MAU Note (Signed)
Patient states she would like to have a check up, has been having some mild cramping on and off. Denies bleeding or discharge.

## 2012-09-08 ENCOUNTER — Inpatient Hospital Stay (HOSPITAL_COMMUNITY)
Admission: AD | Admit: 2012-09-08 | Discharge: 2012-09-08 | Disposition: A | Discharge status: Home / Self Care | Payer: Self-pay | Source: Ambulatory Visit | Attending: Obstetrics & Gynecology | Admitting: Obstetrics & Gynecology

## 2012-09-08 ENCOUNTER — Inpatient Hospital Stay (HOSPITAL_COMMUNITY): Payer: Self-pay

## 2012-09-08 DIAGNOSIS — N83209 Unspecified ovarian cyst, unspecified side: Secondary | ICD-10-CM

## 2012-09-08 DIAGNOSIS — O26899 Other specified pregnancy related conditions, unspecified trimester: Secondary | ICD-10-CM

## 2012-09-08 DIAGNOSIS — O99891 Other specified diseases and conditions complicating pregnancy: Secondary | ICD-10-CM | POA: Insufficient documentation

## 2012-09-08 DIAGNOSIS — R109 Unspecified abdominal pain: Secondary | ICD-10-CM | POA: Insufficient documentation

## 2012-09-08 LAB — HCG, QUANTITATIVE, PREGNANCY: hCG, Beta Chain, Quant, S: 1249 m[IU]/mL — ABNORMAL HIGH (ref <5)

## 2012-09-08 NOTE — MAU Provider Note (Signed)
History     CSN: [id number]  Arrival date and time: 09/08/12 1120   First Provider Initiated Contact with Patient 09/08/12 1304      Chief Complaint  Patient presents with  . Labs Only   HPI [name] is a 22 y.o. G4P0030 at [redacted]w[redacted]d who presents to MAU today for follow-up quant hCG. The patient states that she is still having some lower abdominal cramping, but this has been off and on for the last week or more and not worse than the day of her last evaluation. The patient denies N/V, fever or vaginal bleeding.   OB History   Grav Para Term Preterm Abortions TAB SAB Ect Mult Living   4 0 0 0 3 1 2 0 0 0       Past Medical History  Diagnosis Date  . Ovarian cyst   . Seizures     Last seizure 5 months ago  . Abortion history   . Headache     Past Surgical History  Procedure Laterality Date  . Dilation and curettage of uterus      abortion    Family History  Problem Relation Age of Onset  . Asthma Brother     History  Substance Use Topics  . Smoking status: Current Every Day Smoker -- 0.50 packs/day for 5 years    Types: Cigarettes    Last Attempt to Quit: 04/22/2011  . Smokeless tobacco: Never Used  . Alcohol Use: No    Allergies: No Known Allergies  Prescriptions prior to admission  Medication Sig Dispense Refill  . acetaminophen (TYLENOL) 500 MG tablet Take 500 mg by mouth every 8 (eight) hours as needed for pain. For pain      . levETIRAcetam (KEPPRA) 500 MG tablet Take 500 mg by mouth 2 (two) times daily.       . metroNIDAZOLE (FLAGYL) 500 MG tablet Take 1 tablet (500 mg total) by mouth 2 (two) times daily.  14 tablet  0    Review of Systems  Constitutional: Negative for fever, chills and malaise/fatigue.  Gastrointestinal: Positive for abdominal pain. Negative for nausea and vomiting.  Genitourinary:       Neg vaginal bleeding Neg abnormal discharge   Physical Exam   Blood pressure 114/45, pulse 55, temperature 97.8 F (36.6 C),  temperature source Oral, last menstrual period 07/24/2012.  Physical Exam  Constitutional: She is oriented to person, place, and time. She appears well-developed and well-nourished.  HENT:  Head: Normocephalic and atraumatic.  Cardiovascular: Normal rate.   Respiratory: Effort normal.  Neurological: She is alert and oriented to person, place, and time.  Skin: Skin is warm and dry. No erythema.  Psychiatric: She has a normal mood and affect.   Results for orders placed during the hospital encounter of 09/08/12 (from the past 24 hour(s))  HCG, QUANTITATIVE, PREGNANCY     Status: Abnormal   Collection Time    09/08/12 11:48 AM      Result Value Range   hCG, Beta Chain, Quant, S 1249 (*) <5 mIU/mL   *RADIOLOGY REPORT*  Clinical Data: 22 year old pregnant female with pelvic pain and  cramping. Estimated gestational age of [redacted] weeks 4 days by LMP.  TRANSVAGINAL OBSTETRIC US  Technique: Transvaginal ultrasound was performed for complete  evaluation of the gestation as well as the maternal uterus, adnexal  regions, and pelvic cul-de-sac.  Comparison: None  Intrauterine gestational sac: Visualized.  Yolk sac: Not visualized  Embryo: Not visualized  MSD:  5.2 mm 5 w 0 d [place] EDC: 05/11/2013  Subchorionic hemorrhage: Moderate  Maternal uterus/adnexae:  The ovaries bilaterally are unremarkable.  There is no evidence of free fluid or solid adnexal mass.  A 5 x 7 mm left paraovarian cyst is present.  IMPRESSION:  Single intrauterine gestational sac with moderate subchorionic  hemorrhage. A yolk sac and embryo are not identified at this time.  Estimated gestational age by sonographic mean sac diameter is 5  weeks 0 days.  Original Report Authenticated By: Harmon Pier, M.D.  MAU Course  Procedures None  MDM Discussed abnormal rise in quant hCG with Dr. Marice Potter. She recommends [place] today Discussed [place] and lab results with the patient. She is tearful. This has occurred with all previous  pregnancies.  Assessment and Plan  A: IUGS at 5w 0d without cardiac activity or yolk sac Inappropriate rise in quant hCG, probably SAB  P: Discharge home Bleeding/ectopic precautions discussed Patient may take tylenol as needed for pain Patient will return to [facility] for [place] in 5 days Patient may return to MAU as needed or if her condition were to change or worsen   Freddi Starr, PA-C  09/08/2012, 1:11 PM

## 2012-09-08 NOTE — MAU Note (Signed)
Pt states she has been having some cramping and had to take tylenol for the pain last night. States she is cramping some now but it isnt bad.

## 2012-09-13 ENCOUNTER — Other Ambulatory Visit (HOSPITAL_COMMUNITY): Payer: Self-pay

## 2012-09-14 ENCOUNTER — Ambulatory Visit (HOSPITAL_COMMUNITY): Admission: RE | Admit: 2012-09-14 | Payer: Self-pay | Source: Ambulatory Visit

## 2012-09-17 ENCOUNTER — Ambulatory Visit (HOSPITAL_COMMUNITY): Payer: Self-pay | Attending: Medical

## 2012-09-25 ENCOUNTER — Inpatient Hospital Stay (HOSPITAL_COMMUNITY)
Admission: AD | Admit: 2012-09-25 | Discharge: 2012-09-25 | Disposition: A | Discharge status: Home / Self Care | Payer: Self-pay | Source: Ambulatory Visit | Attending: Obstetrics & Gynecology | Admitting: Obstetrics & Gynecology

## 2012-09-25 ENCOUNTER — Encounter (HOSPITAL_COMMUNITY): Payer: Self-pay | Admitting: *Deleted

## 2012-09-25 ENCOUNTER — Inpatient Hospital Stay (HOSPITAL_COMMUNITY): Payer: Self-pay

## 2012-09-25 DIAGNOSIS — O020 Blighted ovum and nonhydatidiform mole: Secondary | ICD-10-CM

## 2012-09-25 DIAGNOSIS — O0289 Other abnormal products of conception: Secondary | ICD-10-CM

## 2012-09-25 DIAGNOSIS — O039 Complete or unspecified spontaneous abortion without complication: Secondary | ICD-10-CM | POA: Insufficient documentation

## 2012-09-25 LAB — CBC
Hemoglobin: 12.3 g/dL (ref 12.0–15.0)
RBC: 4.03 MIL/uL (ref 3.87–5.11)

## 2012-09-25 LAB — HCG, QUANTITATIVE, PREGNANCY: hCG, Beta Chain, Quant, S: 1587 m[IU]/mL — ABNORMAL HIGH (ref <5)

## 2012-09-25 MED ORDER — METRONIDAZOLE 500 MG PO TABS: 500.0000 mg | ORAL_TABLET | Freq: Two times a day (BID) | ORAL | Status: DC

## 2012-09-25 MED ORDER — MISOPROSTOL 200 MCG PO TABS: 800.0000 ug | ORAL_TABLET | Freq: Once | ORAL | Status: DC

## 2012-09-25 MED ORDER — ACETAMINOPHEN-CODEINE #3 300-30 MG PO TABS: 1.0000 | ORAL_TABLET | Freq: Four times a day (QID) | ORAL | Status: DC | PRN

## 2012-09-25 NOTE — MAU Note (Signed)
Was seen back on 09/08/12; suppose to be taking Flagyl for BV but has not started the med yet; denies pain now but states pain was 10 last night; requesting a work note for a couple of days;

## 2012-09-25 NOTE — MAU Note (Signed)
Seen 2 weeks ago and told that she was miscarrying; c/o abdominal pain and bleeding today;

## 2012-09-25 NOTE — MAU Provider Note (Signed)
History     CSN: [id number]  Arrival date and time: 09/25/12 4098   None     Chief Complaint  Patient presents with  . Miscarriage   HPI  Pt is G4P0030 with known failed pregnancy on 09/08/2012.  Pt was told to f/u in 2 weeks for repeat ultrasound, sooner if increase in pain or bleeding.  This is her 4th miscarriage. Pt has had increase in pain and bleeding since yesterday Pt took Tylenol yesterday.  Pt has had a small clot that she passed- pt is not having cramping or bleeding at this time. Pt is A positive.  Past Medical History  Diagnosis Date  . Ovarian cyst   . Seizures     Last seizure 5 months ago  . Abortion history   . Headache     Past Surgical History  Procedure Laterality Date  . Dilation and curettage of uterus      abortion    Family History  Problem Relation Age of Onset  . Asthma Brother     History  Substance Use Topics  . Smoking status: Current Every Day Smoker -- 0.50 packs/day for 5 years    Types: Cigarettes    Last Attempt to Quit: 04/22/2011  . Smokeless tobacco: Never Used  . Alcohol Use: No    Allergies: No Known Allergies  Prescriptions prior to admission  Medication Sig Dispense Refill  . acetaminophen (TYLENOL) 500 MG tablet Take 500 mg by mouth every 8 (eight) hours as needed for pain. For pain      . levETIRAcetam (KEPPRA) 500 MG tablet Take 500 mg by mouth 2 (two) times daily.       . metroNIDAZOLE (FLAGYL) 500 MG tablet Take 1 tablet (500 mg total) by mouth 2 (two) times daily.  14 tablet  0    Review of Systems  Constitutional: Negative for fever and chills.  Gastrointestinal: Positive for abdominal pain. Negative for heartburn, nausea, vomiting and diarrhea.  Genitourinary: Negative for dysuria and urgency.  Neurological: Negative for headaches.   Physical Exam   Blood pressure 115/68, pulse 78, temperature 98.4 F (36.9 C), temperature source Oral, resp. rate 16, height 5\' 4"  (1.626 m), weight 200 lb (90.719 kg), last  menstrual period 07/24/2012, unknown if currently breastfeeding.  Physical Exam  Nursing note and vitals reviewed. Constitutional: She is oriented to person, place, and time. She appears well-developed and well-nourished.  HENT:  Head: Normocephalic.  Eyes: Pupils are equal, round, and reactive to light.  Neck: Normal range of motion. Neck supple.  Cardiovascular: Normal rate.   Respiratory: Effort normal.  GI: Soft. She exhibits no distension. There is no tenderness. There is no rebound and no guarding.  Genitourinary:  No bleeding noted in vagina, cervix closed, NT; uterus enlarged ~8weeks size NT; adnexal without palpable enlargement or tenderness  Musculoskeletal: Normal range of motion.  Neurological: She is alert and oriented to person, place, and time.  Skin: Skin is warm and dry.  Psychiatric: She has a normal mood and affect.  tearful    MAU Course  Procedures  Discussed with Dr. Debroah Loop findings from ultrasound- HCG not available at time due to technical difficulties- unchanged ultrasound - no YS or Fetal Pole noted Will give pt Cytotec and have pt return in 2 days for repeat HCG- close follow up  Results for orders placed during the hospital encounter of 09/25/12 (from the past 24 hour(s))  HCG, QUANTITATIVE, PREGNANCY     Status: Abnormal  Collection Time    09/25/12  9:45 AM      Result Value Range   hCG, Beta Chain, Quant, S 1587 (*) <5 mIU/mL  CBC     Status: None   Collection Time    09/25/12  9:45 AM      Result Value Range   WBC 8.5  4.0 - 10.5 K/uL   RBC 4.03  3.87 - 5.11 MIL/uL   Hemoglobin 12.3  12.0 - 15.0 g/dL   HCT 57.8  46.9 - 62.9 %   MCV 92.3  78.0 - 100.0 fL   MCH 30.5  26.0 - 34.0 pg   MCHC 33.1  30.0 - 36.0 g/dL   RDW 52.8  41.3 - 24.4 %   Platelets 239  150 - 400 K/uL   Assessment and Plan  Anembryonic pregnancy cytotec in vagina prescription  Tylenol #3 given for pain Return to MAU in 2 days for repeat Beta  HCG  LINEBERRY,SUSAN 09/25/2012, 9:45 AM

## 2012-09-27 ENCOUNTER — Inpatient Hospital Stay (HOSPITAL_COMMUNITY)
Admission: AD | Admit: 2012-09-27 | Discharge: 2012-09-27 | Disposition: A | Discharge status: Home / Self Care | Payer: Self-pay | Source: Ambulatory Visit | Attending: Obstetrics & Gynecology | Admitting: Obstetrics & Gynecology

## 2012-09-27 DIAGNOSIS — G40909 Epilepsy, unspecified, not intractable, without status epilepticus: Secondary | ICD-10-CM | POA: Insufficient documentation

## 2012-09-27 DIAGNOSIS — O020 Blighted ovum and nonhydatidiform mole: Secondary | ICD-10-CM

## 2012-09-27 DIAGNOSIS — O021 Missed abortion: Secondary | ICD-10-CM | POA: Insufficient documentation

## 2012-09-27 DIAGNOSIS — R109 Unspecified abdominal pain: Secondary | ICD-10-CM | POA: Insufficient documentation

## 2012-09-27 MED ORDER — MISOPROSTOL 200 MCG PO TABS: 200.0000 ug | ORAL_TABLET | Freq: Once | ORAL | Status: DC

## 2012-09-27 MED ORDER — IBUPROFEN 600 MG PO TABS: 600.0000 mg | ORAL_TABLET | Freq: Four times a day (QID) | ORAL | Status: DC | PRN

## 2012-09-27 MED ORDER — MISOPROSTOL 200 MCG PO TABS
800.0000 ug | ORAL_TABLET | Freq: Once | ORAL | Status: AC
  Administered 2012-09-27: 800 ug via VAGINAL
  Filled 2012-09-27: qty 4

## 2012-09-27 MED ORDER — OXYCODONE-ACETAMINOPHEN 5-325 MG PO TABS: 1.0000 | ORAL_TABLET | ORAL | Status: DC | PRN

## 2012-09-27 NOTE — MAU Note (Signed)
Pt given insufficient instructions for self administering cytotec last visit. Is tearful. Discussed/reviewed plan of care.

## 2012-09-27 NOTE — MAU Provider Note (Signed)
Attestation of Attending Supervision of Advanced Practitioner (CNM/NP): Evaluation and management procedures were performed by the Advanced Practitioner under my supervision and collaboration.  I have reviewed the Advanced Practitioner's note and chart, and I agree with the management and plan.  HARRAWAY-SMITH, CAROLYN 1:47 PM

## 2012-09-27 NOTE — MAU Provider Note (Signed)
22 yo G4P0040 with 5w GS size anembryonic pregnancy initially seen here 09/07/2012 with lower abdominal pain.  Beta hCG was 703 and ulrasound showed empty gestational sac c/w 4 weeks 6 days. On 09/25/12 US showed no change and Beta hCG was 1587. That day she was given cytotec but says she was not told how to use it and has placed 200 mcg pv bid. Tearful and cannot afford more cytotec. Denies pain or bleeding.  A+  PMH: seizure d/o on [place] [facility] HX: EAB x1, SAB x3 (@8wks , @14  wks and present@5wks )   Filed Vitals:   09/27/12 1132  BP: 114/62  Pulse: 78  Temp: 97.8 F (36.6 C)  Resp: 18  Gen: NAD, tearful Abd: soft, NT  MAU course: cytotec 800 mcg placed posterior fornix vagina  Results for orders placed during the hospital encounter of 09/27/12 (from the past 24 hour(s))  HCG, QUANTITATIVE, PREGNANCY     Status: Abnormal   Collection Time    09/27/12 11:35 AM      Result Value Range   hCG, Beta Chain, Quant, S 1116 (*) <5 mIU/mL   A: Anembryonic pregnancy Repetitive pregnancy loss Seizure D/O  P: Ectopic precautions reviewed. Lab only visit in [facility] in 2 wks for quant Beta hCG Office visit [facility] for w/u RPL after 1 month Advised abstinence prior to that visit.   Medication List    STOP taking these medications       misoprostol 200 MCG tablet  Commonly known as:  CYTOTEC      TAKE these medications       ibuprofen 600 MG tablet  Commonly known as:  ADVIL,MOTRIN  Take 1 tablet (600 mg total) by mouth every 6 (six) hours as needed for pain.     levETIRAcetam 500 MG tablet  Commonly known as:  KEPPRA  Take 500 mg by mouth 2 (two) times daily.     metroNIDAZOLE 500 MG tablet  Commonly known as:  FLAGYL  Take 1 tablet (500 mg total) by mouth 2 (two) times daily.     oxyCODONE-acetaminophen 5-325 MG per tablet  Commonly known as:  PERCOCET/ROXICET  Take 1 tablet by mouth every 4 (four) hours as needed for pain.      ASK your doctor about these medications       acetaminophen 500 MG tablet  Commonly known as:  TYLENOL  Take 500 mg by mouth daily as needed for pain.       Follow-up Information   Follow up with [facility]. (someone from clinic will call you with an appointment for a lab tests in 2 weeks and then of visit one month after that)    Contact information:   [address] [place] [address] [phone]      Danae Orleans, CNM 09/27/2012 1:05 PM

## 2012-09-27 NOTE — MAU Note (Signed)
Patient to MAU for follow up BHCG. Patient states she has been using the Cytotec one pill in the vagina twice a day. Patient states she was not given instructions on how to use the Cytotec. States she is still bleeding and having mild cramping.

## 2012-09-28 ENCOUNTER — Encounter: Payer: Self-pay | Admitting: Obstetrics and Gynecology

## 2012-10-08 ENCOUNTER — Other Ambulatory Visit: Payer: Self-pay

## 2012-10-10 ENCOUNTER — Other Ambulatory Visit: Payer: Self-pay

## 2012-10-22 ENCOUNTER — Encounter: Payer: Self-pay | Admitting: Obstetrics and Gynecology

## 2012-11-07 ENCOUNTER — Inpatient Hospital Stay (HOSPITAL_COMMUNITY)
Admission: AD | Admit: 2012-11-07 | Discharge: 2012-11-07 | Disposition: A | Discharge status: Home / Self Care | Payer: Self-pay | Source: Ambulatory Visit | Attending: Obstetrics and Gynecology | Admitting: Obstetrics and Gynecology

## 2012-11-07 ENCOUNTER — Encounter (HOSPITAL_COMMUNITY): Payer: Self-pay | Admitting: *Deleted

## 2012-11-07 DIAGNOSIS — IMO0002 Reserved for concepts with insufficient information to code with codable children: Secondary | ICD-10-CM | POA: Insufficient documentation

## 2012-11-07 DIAGNOSIS — N926 Irregular menstruation, unspecified: Secondary | ICD-10-CM

## 2012-11-07 LAB — CBC
HCT: 37.1 % (ref 36.0–46.0)
Hemoglobin: 12.6 g/dL (ref 12.0–15.0)
MCH: 30.9 pg (ref 26.0–34.0)
MCV: 90.9 fL (ref 78.0–100.0)
RBC: 4.08 MIL/uL (ref 3.87–5.11)

## 2012-11-07 MED ORDER — NORGESTIMATE-ETH ESTRADIOL 0.25-35 MG-MCG PO TABS: 1.0000 | ORAL_TABLET | Freq: Every day | ORAL | Status: DC

## 2012-11-07 NOTE — MAU Note (Signed)
Pt states she had a miscarriage that she is being,followed for. Pt states she is still bleeding since 09/27/2012.  Pt also states she is having cramping

## 2012-11-07 NOTE — MAU Note (Signed)
Patient states she had a miscarriage in March. States she continues to have bleeding and cramping.

## 2012-11-07 NOTE — MAU Provider Note (Signed)
History     CSN: [id number]  Arrival date and time: 11/07/12 1439   First Provider Initiated Contact with Patient 11/07/12 1518      Chief Complaint  Patient presents with  . Vaginal Bleeding  . Abdominal Pain   HPI [name] is a 22 y.o. [phone] who present to MAU today with vaginal bleeding and abdominal cramping. The patient was seen here in early March and had SAB. She was given cytotec in MAU on 09/27/12. She has been bleeding every since then. The bleeding was lighter for a few days but has become heavy again today. She is having lower abdominal cramping. She denies N/V, fever, dizziness, weakness or feeling lightheaded.    OB History   Grav Para Term Preterm Abortions TAB SAB Ect Mult Living   4 0 0 0 3 1 2 0 0 0       Past Medical History  Diagnosis Date  . Ovarian cyst   . Seizures     Last seizure 5 months ago  . Abortion history   . Headache     Past Surgical History  Procedure Laterality Date  . Dilation and curettage of uterus      abortion    Family History  Problem Relation Age of Onset  . Asthma Brother     History  Substance Use Topics  . Smoking status: Current Every Day Smoker -- 0.50 packs/day for 5 years    Types: Cigarettes    Last Attempt to Quit: 04/22/2011  . Smokeless tobacco: Never Used  . Alcohol Use: No    Allergies: No Known Allergies  Prescriptions prior to admission  Medication Sig Dispense Refill  . acetaminophen (TYLENOL) 500 MG tablet Take 500 mg by mouth daily as needed for pain.      [name] ibuprofen (ADVIL,MOTRIN) 600 MG tablet Take 1 tablet (600 mg total) by mouth every 6 (six) hours as needed for pain.  30 tablet  1  . levETIRAcetam (KEPPRA) 500 MG tablet Take 500 mg by mouth 2 (two) times daily.       . metroNIDAZOLE (FLAGYL) 500 MG tablet Take 1 tablet (500 mg total) by mouth 2 (two) times daily.  14 tablet  0  . oxyCODONE-acetaminophen (PERCOCET/ROXICET) 5-325 MG per tablet Take 1 tablet by mouth every 4  (four) hours as needed for pain.  20 tablet  0    Review of Systems  Constitutional: Negative for fever, chills and malaise/fatigue.  Gastrointestinal: Positive for abdominal pain. Negative for nausea and vomiting.  Genitourinary: Negative for dysuria, urgency and frequency.       + vaginal bleeding  Neurological: Negative for dizziness, loss of consciousness and weakness.   Physical Exam   Blood pressure 119/74, pulse 68, temperature 97.7 F (36.5 C), temperature source Oral, resp. rate 16, height 5\' 4"  (1.626 m), weight 206 lb 12.8 oz (93.804 kg), SpO2 100.00%.  Physical Exam  Constitutional: She is oriented to person, place, and time. She appears well-developed and well-nourished. No distress.  HENT:  Head: Normocephalic and atraumatic.  Cardiovascular: Normal rate, regular rhythm and normal heart sounds.   Respiratory: Effort normal and breath sounds normal. No respiratory distress.  GI: Soft. Bowel sounds are normal. She exhibits no distension and no mass. There is no tenderness. There is no rebound and no guarding.  Genitourinary: Vagina normal. Uterus is not enlarged and not tender. Cervix exhibits discharge (small amount of active bleeding from the cervical os and in the vaginal vault).  Cervix exhibits no motion tenderness and no friability. Right adnexum displays no mass and no tenderness. Left adnexum displays no mass and no tenderness.  Neurological: She is alert and oriented to person, place, and time.  Skin: Skin is warm and dry. No erythema. No pallor.  Psychiatric: She has a normal mood and affect.   Results for orders placed during the hospital encounter of 11/07/12 (from the past 24 hour(s))  HCG, QUANTITATIVE, PREGNANCY     Status: None   Collection Time    11/07/12  3:33 PM      Result Value Range   hCG, Beta Chain, Quant, S 1  <5 mIU/mL  CBC     Status: None   Collection Time    11/07/12  3:33 PM      Result Value Range   WBC 7.1  4.0 - 10.5 K/uL   RBC 4.08   3.87 - 5.11 MIL/uL   Hemoglobin 12.6  12.0 - 15.0 g/dL   HCT 40.9  81.1 - 91.4 %   MCV 90.9  78.0 - 100.0 fL   MCH 30.9  26.0 - 34.0 pg   MCHC 34.0  30.0 - 36.0 g/dL   RDW 78.2  95.6 - 21.3 %   Platelets 256  150 - 400 K/uL    MAU Course  Procedures None  MDM CBC and Quant hCG today Hgb stable Quant < 5  Assessment and Plan  A: Irregular bleeding s/p recent SAB  P: Discharge home Rx for Sprintec x 2 months sent to patient's pharmacy Encouraged patient to quite smoking. Patient voices understanding Patient will follow-up in [facility] clinic in 4-6 weeks to discuss bleeding control on OCPs Patient may return to MAU as needed or if her condition were to change or worsen  Freddi Starr, PA-C  11/07/2012, 3:18 PM

## 2012-11-08 NOTE — MAU Provider Note (Signed)
Attestation of Attending Supervision of Advanced Practitioner (CNM/NP): Evaluation and management procedures were performed by the Advanced Practitioner under my supervision and collaboration.  I have reviewed the Advanced Practitioner's note and chart, and I agree with the management and plan.  Oluwasemilore Pascuzzi 11/08/2012 12:50 PM   

## 2012-12-20 ENCOUNTER — Encounter: Payer: Self-pay | Admitting: Obstetrics & Gynecology

## 2012-12-21 ENCOUNTER — Ambulatory Visit (INDEPENDENT_AMBULATORY_CARE_PROVIDER_SITE_OTHER): Payer: Self-pay | Admitting: Neurology

## 2012-12-21 ENCOUNTER — Encounter: Payer: Self-pay | Admitting: Neurology

## 2012-12-21 VITALS — BP 101/68 | HR 76 | Ht 64.0 in | Wt 206.0 lb

## 2012-12-21 DIAGNOSIS — R569 Unspecified convulsions: Secondary | ICD-10-CM

## 2012-12-21 MED ORDER — LEVETIRACETAM 500 MG PO TABS: 500.0000 mg | ORAL_TABLET | Freq: Two times a day (BID) | ORAL | Status: DC

## 2012-12-21 NOTE — Progress Notes (Signed)
HPI: Brittany Hendrix is a 22 year old female, who presents to the office with a history of seizures.   She first had seizures when she was 108-44 years old.  All seizure has similar seminology. She has an aura before those seizures,  where she feels drowsy and sometimes nauseated, lasting few minutes, followed by loss of conciousness, whole body shaking, then post ictal phase, were she is confused for about 20-30 minutes after the seizure.   She has injured herself in the face and on her leg  during those seizures in the past. She was never on any antiseizure medication, until after her  seizure  in May 2012, she was put on Levetiracetam 500 mg b.i.d. 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  her bloodwork tested positive for marijuana.   Last seizure was in Jul 2013, she had few 3-4 recurrent seizure while not taking her keppra. She has no recurrent seizure if she is taking her Keppra 500 mg twice a day, she is self-pay, [employer] has been very expensive for her, sometimes she has to pay it week by week, She does not drive, lives with her mother,  Repeat MRI in 2013 was within normal limits. 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 EEG was normal  Review of Systems  Out of a complete 14 system review, the patient complains of only the following symptoms, and all other reviewed systems are negative.  Musculoskeletal: Joint pain       Physical Exam  General: Patient is well developed and well groomed, OVERWEIGHT. Patient is awake and alert and in no acute distress. Head: Symmetric facial features.  Neck: Symmetric  Respiratory: LCA Cardiovascular: Regular rate and rhythm with no murmurs.  Skin: No rash, no bruising  Neurologic Exam  Mental Status: Awake,  alert and oriented to person, place and time. Recent and remote memory, attention span, concentration and fund of knowledge are normal. Language is fluent and comprehensive. Cranial Nerves: Pupils are equal and round and reactive to light. Conjugate eye movements are full and symmetric. Visual fields are full to confrontation. No evidence of papilledema or other changes on funduscopy. Facial sensation and facial muscle strength are symmetric and in normal limits. Hearing is intact. Palate elevated symmetrically and uvula is midline. Shoulder shrug is symmetric. Tongue is midline. Motor: Symmetric normal motor tone is noted throughout. Normal muscle bulk. Testing reveals 5/5  muscle strength of the upper extremity, and 5/5 for the lower extremity.  Fine motor movements are normal in both hands. Sensory: Intact and symmetric to light touch, pinprick, temperature, vibration and proprioception. Coordination: Cerebellar testing reveals good finger-to-nose and heel-to-shin bilaterally. No tremor, dystaxia or dysmetria noted. Gait and Station: Arises from chair without difficulty.  Narrow based gait with normal arm swing bilateral, able to walk on heels and toes.   Tandem walk is stable. Reflexes: DTR in the upper and lower extremity are present and symmetric 2+. Babinski response is down going.     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 A MRI performed last year was without significant findings, except a small  arachnoid cyst in the posterior fossa on the right.   continue  Levetiracetam 500mg  bid. We will try a medical assistant program  return to clinic in one year

## 2012-12-23 DIAGNOSIS — R569 Unspecified convulsions: Secondary | ICD-10-CM | POA: Insufficient documentation

## 2013-01-17 ENCOUNTER — Encounter: Payer: Self-pay | Admitting: Advanced Practice Midwife

## 2013-07-22 ENCOUNTER — Encounter (HOSPITAL_COMMUNITY): Payer: Self-pay | Admitting: Emergency Medicine

## 2013-07-22 ENCOUNTER — Emergency Department (HOSPITAL_COMMUNITY)
Admission: EM | Admit: 2013-07-22 | Discharge: 2013-07-22 | Disposition: A | Discharge status: Home / Self Care | Payer: Self-pay | Attending: Emergency Medicine | Admitting: Emergency Medicine

## 2013-07-22 DIAGNOSIS — R569 Unspecified convulsions: Secondary | ICD-10-CM

## 2013-07-22 DIAGNOSIS — Z3202 Encounter for pregnancy test, result negative: Secondary | ICD-10-CM | POA: Insufficient documentation

## 2013-07-22 DIAGNOSIS — N898 Other specified noninflammatory disorders of vagina: Secondary | ICD-10-CM | POA: Insufficient documentation

## 2013-07-22 DIAGNOSIS — Z79899 Other long term (current) drug therapy: Secondary | ICD-10-CM | POA: Insufficient documentation

## 2013-07-22 DIAGNOSIS — F17200 Nicotine dependence, unspecified, uncomplicated: Secondary | ICD-10-CM | POA: Insufficient documentation

## 2013-07-22 DIAGNOSIS — G40909 Epilepsy, unspecified, not intractable, without status epilepticus: Secondary | ICD-10-CM | POA: Insufficient documentation

## 2013-07-22 LAB — URINALYSIS, ROUTINE W REFLEX MICROSCOPIC
Bilirubin Urine: NEGATIVE
Glucose, UA: NEGATIVE mg/dL
Ketones, ur: NEGATIVE mg/dL
Leukocytes, UA: NEGATIVE
Protein, ur: NEGATIVE mg/dL
pH: 7.5 (ref 5.0–8.0)

## 2013-07-22 LAB — WET PREP, GENITAL
Clue Cells Wet Prep HPF POC: NONE SEEN
WBC, Wet Prep HPF POC: NONE SEEN

## 2013-07-22 LAB — CBC WITH DIFFERENTIAL/PLATELET
Basophils Absolute: 0 10*3/uL (ref 0.0–0.1)
Basophils Relative: 0 % (ref 0–1)
Eosinophils Relative: 3 % (ref 0–5)
HCT: 36.3 % (ref 36.0–46.0)
Hemoglobin: 12.5 g/dL (ref 12.0–15.0)
MCHC: 34.4 g/dL (ref 30.0–36.0)
MCV: 91.2 fL (ref 78.0–100.0)
Monocytes Absolute: 0.5 10*3/uL (ref 0.1–1.0)
Monocytes Relative: 8 % (ref 3–12)
RDW: 12.3 % (ref 11.5–15.5)

## 2013-07-22 LAB — BASIC METABOLIC PANEL
BUN: 12 mg/dL (ref 6–23)
CO2: 25 mEq/L (ref 19–32)
Calcium: 8.9 mg/dL (ref 8.4–10.5)
Creatinine, Ser: 0.68 mg/dL (ref 0.50–1.10)
GFR calc Af Amer: >90 mL/min (ref >90)

## 2013-07-22 LAB — URINE MICROSCOPIC-ADD ON

## 2013-07-22 LAB — POCT PREGNANCY, URINE: Preg Test, Ur: NEGATIVE

## 2013-07-22 NOTE — ED Provider Notes (Signed)
Medical screening examination/treatment/procedure(s) were performed by non-physician practitioner and as supervising physician I was immediately available for consultation/collaboration.  EKG Interpretation   None         Layla Maw Gurpreet Mikhail, DO 07/22/13 1533

## 2013-07-22 NOTE — ED Notes (Signed)
Per EMS: pt had witnessed 3 mins seizure, denies pain. Pt was not post ictal upon arrival. Pt was ambulatory upon EMS arrival. Last seizure about 1 month ago.

## 2013-07-22 NOTE — ED Notes (Signed)
Bed: ZO10 Expected date:  Expected time:  Means of arrival:  Comments: EMS-sz

## 2013-07-22 NOTE — Progress Notes (Signed)
[facility] provided pt with a list of primary care resources, [facility] Orange Card application, and [facility] information.  °

## 2013-07-22 NOTE — ED Provider Notes (Signed)
CSN: [id number]     Arrival date & time 07/22/13  1191 History   First MD Initiated Contact with Patient 07/22/13 [phone]     Chief Complaint  Patient presents with  . Seizures   (Consider location/radiation/quality/duration/timing/severity/associated sxs/prior Treatment) Patient is a 22 y.o. female presenting with seizures. The history is provided by the patient and medical records.  Seizures  This is a 22 year old female with past medical history significant for seizure disorder, ovarian cyst, presenting to the ED for unwitnessed seizure this morning upon waking. Patient states she is getting out of bed to get ready for school, and woke up in the floor with EMS paramedics standing above her.  Pt endorses compliance with her keppra, last breakthrough seizure was approx 1 year ago.  Denies any medications changes.  No recent drugs or EtOH.  Pt does admit to increased stress and decreased sleep due to work and school.  Denies recent illness, fevers, sweats, or chills.  Pt also complains of vaginal discharge-- described as white without odor.  Notes some vaginal irritation but denies pelvic pain.  Has taken multiple courses of abx for the same without resolution.  Pt denies urinary sx or flank pain.  Denies new sexual partner or concern for STD.  No prior hx of STD.  Past Medical History  Diagnosis Date  . Ovarian cyst   . Seizures     Last seizure 5 months ago  . Abortion history   . NFAOZHYQ(657.8)    Past Surgical History  Procedure Laterality Date  . Dilation and curettage of uterus      abortion   Family History  Problem Relation Age of Onset  . Asthma Brother   . Diabetes Mother    History  Substance Use Topics  . Smoking status: Current Every Day Smoker -- 0.50 packs/day for 5 years    Types: Cigarettes  . Smokeless tobacco: Never Used  . Alcohol Use: 0.6 oz/week    1 Cans of beer per week     Comment: OCC   OB History   Grav Para Term Preterm Abortions TAB SAB Ect Mult  Living   4 0 0 0 3 1 2 0 0 0      Review of Systems  Genitourinary: Positive for vaginal discharge.  Neurological: Positive for seizures.  All other systems reviewed and are negative.    Allergies  Review of patient's allergies indicates no known allergies.  Home Medications   Current Outpatient Rx  Name  Route  Sig  Dispense  Refill  . acetaminophen (TYLENOL) 500 MG tablet   Oral   Take 500 mg by mouth daily as needed for pain.         [name] levETIRAcetam (KEPPRA) 500 MG tablet   Oral   Take 1 tablet (500 mg total) by mouth 2 (two) times daily.   70 tablet   12    BP 105/48  Pulse 77  Temp(Src) 98.3 F (36.8 C) (Oral)  Resp 16  SpO2 100%  Physical Exam  Nursing note and vitals reviewed. Constitutional: She is oriented to person, place, and time. She appears well-developed and well-nourished. No distress.  Awake, alert, not post-ictal  HENT:  Head: Normocephalic and atraumatic. Head is without raccoon's eyes, without Battle's sign, without abrasion, without contusion and without laceration.  Right Ear: Tympanic membrane and ear canal normal.  Left Ear: Tympanic membrane and ear canal normal.  Nose: Nose normal.  Mouth/Throat: Uvula is midline, oropharynx is clear and moist  and mucous membranes are normal. No oral lesions. No trismus in the jaw. No lacerations. No oropharyngeal exudate, posterior oropharyngeal edema, posterior oropharyngeal erythema or tonsillar abscesses.  No visible signs of head trauma; no hemotympanum; No oral mucosal or dental injury  Eyes: Conjunctivae and EOM are normal. Pupils are equal, round, and reactive to light.  Neck: Normal range of motion.  Cardiovascular: Normal rate, regular rhythm and normal heart sounds.   Pulmonary/Chest: Effort normal and breath sounds normal. No respiratory distress. She has no wheezes.  Abdominal: Soft. Bowel sounds are normal.  Genitourinary: There is no tenderness or lesion on the right labia. There is no  tenderness or lesion on the left labia. Cervix exhibits no motion tenderness. Right adnexum displays no tenderness. Left adnexum displays no tenderness. There is bleeding around the vagina. No tenderness around the vagina. No vaginal discharge found.  No vaginal discharge seen; scant menstrual bleeding; no adnexal or CMT  Musculoskeletal: Normal range of motion.  Neurological: She is alert and oriented to person, place, and time. She has normal strength. She displays no tremor. No cranial nerve deficit or sensory deficit. She displays no seizure activity. Gait normal.  Pt AAOx3, answering questions appropriately; CN grossly intact, moves all extremities appropriately without ataxia, no focal neuro deficits or facial asymmetry appreciated; no tremors or seizure activity  Skin: Skin is warm and dry. She is not diaphoretic.  Psychiatric: She has a normal mood and affect.    ED Course  Procedures (including critical care time)   Date: 07/22/2013  Rate: 75  Rhythm: normal sinus rhythm  QRS Axis: normal  Intervals: normal  ST/T Wave abnormalities: normal  Conduction Disutrbances:none  Narrative Interpretation:   Old EKG Reviewed: unchanged   Labs Review Labs Reviewed  URINALYSIS, ROUTINE W REFLEX MICROSCOPIC - Abnormal; Notable for the following:    Hgb urine dipstick MODERATE (*)    All other components within normal limits  URINE MICROSCOPIC-ADD ON - Abnormal; Notable for the following:    Squamous Epithelial / LPF FEW (*)    All other components within normal limits  WET PREP, GENITAL  GC/CHLAMYDIA PROBE AMP  CBC WITH DIFFERENTIAL  BASIC METABOLIC PANEL  POCT PREGNANCY, URINE   Imaging Review No results found.  EKG Interpretation   None       MDM   1. Seizure    EKG normal sinus rhythm, no acute ischemic changes. Lab work unremarkable. UA with moderate blood, patient is currently on her menstrual cycle.  Wet prep unremarkable.  Gc/Chl pending.  Patient has had no  further tremors or seizure activity in the ED-- likely breakthrough seizure today.  Advised to continue taking keppra as directed.  FU with [facility] if problems occur.  Will be notified in 48-72 hours if culture results are positive.  Discussed plan with pt, she agreed.  Return precautions advised.  Garlon Hatchet, PA-C 07/22/13 1420

## 2013-07-23 LAB — GC/CHLAMYDIA PROBE AMP: GC Probe RNA: NEGATIVE

## 2013-10-03 ENCOUNTER — Encounter (HOSPITAL_COMMUNITY): Payer: Self-pay | Admitting: Emergency Medicine

## 2013-10-03 ENCOUNTER — Emergency Department (HOSPITAL_COMMUNITY): Payer: Self-pay

## 2013-10-03 ENCOUNTER — Emergency Department (HOSPITAL_COMMUNITY)
Admission: EM | Admit: 2013-10-03 | Discharge: 2013-10-03 | Disposition: A | Discharge status: Home / Self Care | Payer: Self-pay | Attending: Emergency Medicine | Admitting: Emergency Medicine

## 2013-10-03 DIAGNOSIS — W2209XA Striking against other stationary object, initial encounter: Secondary | ICD-10-CM | POA: Insufficient documentation

## 2013-10-03 DIAGNOSIS — G40909 Epilepsy, unspecified, not intractable, without status epilepticus: Secondary | ICD-10-CM | POA: Insufficient documentation

## 2013-10-03 DIAGNOSIS — Y990 Civilian activity done for income or pay: Secondary | ICD-10-CM | POA: Insufficient documentation

## 2013-10-03 DIAGNOSIS — Y9289 Other specified places as the place of occurrence of the external cause: Secondary | ICD-10-CM | POA: Insufficient documentation

## 2013-10-03 DIAGNOSIS — Z3202 Encounter for pregnancy test, result negative: Secondary | ICD-10-CM | POA: Insufficient documentation

## 2013-10-03 DIAGNOSIS — F17200 Nicotine dependence, unspecified, uncomplicated: Secondary | ICD-10-CM | POA: Insufficient documentation

## 2013-10-03 DIAGNOSIS — S6990XA Unspecified injury of unspecified wrist, hand and finger(s), initial encounter: Secondary | ICD-10-CM | POA: Insufficient documentation

## 2013-10-03 DIAGNOSIS — Y9389 Activity, other specified: Secondary | ICD-10-CM | POA: Insufficient documentation

## 2013-10-03 DIAGNOSIS — N390 Urinary tract infection, site not specified: Secondary | ICD-10-CM | POA: Insufficient documentation

## 2013-10-03 DIAGNOSIS — N898 Other specified noninflammatory disorders of vagina: Secondary | ICD-10-CM | POA: Insufficient documentation

## 2013-10-03 DIAGNOSIS — Z79899 Other long term (current) drug therapy: Secondary | ICD-10-CM | POA: Insufficient documentation

## 2013-10-03 LAB — WET PREP, GENITAL
Trich, Wet Prep: NONE SEEN
YEAST WET PREP: NONE SEEN

## 2013-10-03 LAB — URINE MICROSCOPIC-ADD ON

## 2013-10-03 LAB — URINALYSIS, ROUTINE W REFLEX MICROSCOPIC
BILIRUBIN URINE: NEGATIVE
GLUCOSE, UA: NEGATIVE mg/dL
Hgb urine dipstick: NEGATIVE
Ketones, ur: NEGATIVE mg/dL
LEUKOCYTES UA: NEGATIVE
Nitrite: NEGATIVE
PH: 8 (ref 5.0–8.0)
Protein, ur: NEGATIVE mg/dL
SPECIFIC GRAVITY, URINE: 1.021 (ref 1.005–1.030)
Urobilinogen, UA: 0.2 mg/dL (ref 0.0–1.0)

## 2013-10-03 LAB — POC URINE PREG, ED: Preg Test, Ur: NEGATIVE

## 2013-10-03 MED ORDER — SULFAMETHOXAZOLE-TRIMETHOPRIM 800-160 MG PO TABS
1.0000 | ORAL_TABLET | Freq: Two times a day (BID) | ORAL | Status: DC
Start: 2013-10-03 — End: 2013-10-15

## 2013-10-03 NOTE — ED Notes (Signed)
Per pt, states vaginal issues for 2 weeks-wants pregnancy test-multiple complaints-discharge and pelvic pain

## 2013-10-03 NOTE — ED Provider Notes (Signed)
Medical screening examination/treatment/procedure(s) were performed by non-physician practitioner and as supervising physician I was immediately available for consultation/collaboration.   EKG Interpretation None        Gwyneth SproutWhitney Durga Saldarriaga, MD 10/03/13 2359

## 2013-10-03 NOTE — Discharge Instructions (Signed)
Take the prescribed medication as directed. °Follow-up with your primary care physician. °Return to the ED for new or worsening symptoms. ° °

## 2013-10-03 NOTE — ED Provider Notes (Signed)
CSN: [id number]     Arrival date & time 10/03/13  1537 History   First MD Initiated Contact with Patient 10/03/13 1621     Chief Complaint  Patient presents with  . Pelvic Pain     (Consider location/radiation/quality/duration/timing/severity/associated sxs/prior Treatment) Patient is a 23 y.o. female presenting with pelvic pain. The history is provided by the patient and medical records.  Pelvic Pain Associated symptoms include arthralgias.   This is a 23 year old female with history significant for ovarian cysts presenting to the ED for vaginal discharge and irritation x 2 weeks.  Discharged described as thin, white, with foul odor.  States she had similar sx in the past and was dx with BV.  Pt admits to 1 new sexual partner, her current boyfriend.  No prior hx of STD.  Unsure of pregnancy status at this time, not currently on any form of birth control.  No fevers or chills.  No urinary sx.  Pt also complains of right hand pain.  States she accidentally hit it against a metal post at work today.  Denies numbness/paresthesias of hand or fingers.  Full ROM maintained.  No prior right hand injuries.  Past Medical History  Diagnosis Date  . Ovarian cyst   . Seizures     Last seizure 5 months ago  . Abortion history   . NWGNFAOZ(308.6Headache(784.0)    Past Surgical History  Procedure Laterality Date  . Dilation and curettage of uterus      abortion   Family History  Problem Relation Age of Onset  . Asthma Brother   . Diabetes Mother    History  Substance Use Topics  . Smoking status: Current Every Day Smoker -- 0.50 packs/day for 5 years    Types: Cigarettes  . Smokeless tobacco: Never Used  . Alcohol Use: 0.6 oz/week    1 Cans of beer per week     Comment: OCC   OB History   Grav Para Term Preterm Abortions TAB SAB Ect Mult Living   4 0 0 0 3 1 2 0 0 0      Review of Systems  Genitourinary: Positive for vaginal discharge and vaginal pain.  Musculoskeletal: Positive for  arthralgias.  All other systems reviewed and are negative.      Allergies  Review of patient's allergies indicates no known allergies.  Home Medications   Current Outpatient Rx  Name  Route  Sig  Dispense  Refill  . levETIRAcetam (KEPPRA) 500 MG tablet   Oral   Take 1 tablet (500 mg total) by mouth 2 (two) times daily.   70 tablet   12    BP 119/66  Pulse 67  Temp(Src) 98.4 F (36.9 C) (Oral)  Resp 16  SpO2 100%  LMP 09/16/2013  Physical Exam  Nursing note and vitals reviewed. Constitutional: She is oriented to person, place, and time. She appears well-developed and well-nourished. No distress.  HENT:  Head: Normocephalic and atraumatic.  Mouth/Throat: Oropharynx is clear and moist.  Eyes: Conjunctivae and EOM are normal. Pupils are equal, round, and reactive to light.  Neck: Normal range of motion. Neck supple.  Cardiovascular: Normal rate, regular rhythm and normal heart sounds.   Pulmonary/Chest: Effort normal and breath sounds normal. No respiratory distress. She has no wheezes.  Abdominal: Soft. Bowel sounds are normal. There is no tenderness. There is no guarding.  Genitourinary: There is no lesion on the right labia. There is no lesion on the left labia. Cervix exhibits no motion  tenderness. Right adnexum displays no mass and no tenderness. Left adnexum displays no mass and no tenderness. No erythema or bleeding around the vagina. No foreign body around the vagina. Vaginal discharge found.  Normal external genitalia; scant amount of thin, white, vaginal discharge without odor; no adnexal or CMT  Musculoskeletal: Normal range of motion. She exhibits no edema.       Right hand: She exhibits tenderness and bony tenderness. She exhibits normal range of motion, normal two-point discrimination, normal capillary refill, no deformity, no laceration and no swelling. Normal sensation noted. Normal strength noted.       Hands: TTP to dorsal right hand over 3rd and 4th  metacarpals; small bruise present without swelling or gross deformity; full ROM of wrist and all fingers; strong radial pulse and cap refill; sensation intact  Neurological: She is alert and oriented to person, place, and time.  Skin: Skin is warm and dry. She is not diaphoretic.  Psychiatric: She has a normal mood and affect.    ED Course  Procedures (including critical care time) Labs Review Labs Reviewed  WET PREP, GENITAL - Abnormal; Notable for the following:    Clue Cells Wet Prep HPF POC RARE (*)    WBC, Wet Prep HPF POC RARE (*)    All other components within normal limits  URINALYSIS, ROUTINE W REFLEX MICROSCOPIC - Abnormal; Notable for the following:    APPearance TURBID (*)    All other components within normal limits  URINE MICROSCOPIC-ADD ON - Abnormal; Notable for the following:    Squamous Epithelial / LPF FEW (*)    Bacteria, UA MANY (*)    All other components within normal limits  GC/CHLAMYDIA PROBE AMP  POC URINE PREG, ED   Imaging Review No results found.   EKG Interpretation None      MDM   Final diagnoses:  UTI (lower urinary tract infection)  Vaginal discharge   Offered x-ray of right hand, pt declined and opted for an ace wrap instead. U/a with many bacteria, few squamous-- short course bactrim.  Wet prep with rare clue cells, scant d/c noted on exam-- will hold off on tx for BV at this time.  Pt will FU with PCP if problems occur.  Discussed plan with pt, she acknowledged understanding and agreed with plan of care.  Return precautions advised.  Garlon Hatchet, PA-C 10/03/13 2133

## 2013-10-03 NOTE — ED Notes (Addendum)
Pt sts that she came to the ER to see if she was pregnant. Pt took pregnancy test earlier today and it was negative. Pt c/o nausea x 1 week with 1 episode of vomiting last week. Pt sts boyfriend is vomiting too. Pt sts "I really think I'm pregnant." Pt denies vaginal discharge. Pt c/o urinary frequency.  Pt also c/o R hand pain 8/10. Pt sts that she was at work today and hit the top of her hand against some metal and it bruised up quickly. Pt has slight bruising to top of hand. Pt also c/o toothache x 3 days. A&Ox4. NAD noted.

## 2013-10-04 LAB — GC/CHLAMYDIA PROBE AMP
CT PROBE, AMP APTIMA: NEGATIVE
GC PROBE AMP APTIMA: POSITIVE — AB

## 2013-10-05 ENCOUNTER — Telehealth (HOSPITAL_BASED_OUTPATIENT_CLINIC_OR_DEPARTMENT_OTHER): Payer: Self-pay | Admitting: Emergency Medicine

## 2013-10-05 NOTE — Telephone Encounter (Signed)
+  Gonorrhea. Chart sent to EDP office for review. DHHS attached. °

## 2013-10-06 ENCOUNTER — Emergency Department (HOSPITAL_COMMUNITY)
Admission: EM | Admit: 2013-10-06 | Discharge: 2013-10-06 | Disposition: A | Discharge status: Home / Self Care | Payer: Self-pay | Attending: Emergency Medicine | Admitting: Emergency Medicine

## 2013-10-06 ENCOUNTER — Encounter (HOSPITAL_COMMUNITY): Payer: Self-pay | Admitting: Emergency Medicine

## 2013-10-06 DIAGNOSIS — Z792 Long term (current) use of antibiotics: Secondary | ICD-10-CM | POA: Insufficient documentation

## 2013-10-06 DIAGNOSIS — Z789 Other specified health status: Secondary | ICD-10-CM

## 2013-10-06 DIAGNOSIS — Z8742 Personal history of other diseases of the female genital tract: Secondary | ICD-10-CM | POA: Insufficient documentation

## 2013-10-06 DIAGNOSIS — Z760 Encounter for issue of repeat prescription: Secondary | ICD-10-CM | POA: Insufficient documentation

## 2013-10-06 DIAGNOSIS — G40909 Epilepsy, unspecified, not intractable, without status epilepticus: Secondary | ICD-10-CM | POA: Insufficient documentation

## 2013-10-06 DIAGNOSIS — Z79899 Other long term (current) drug therapy: Secondary | ICD-10-CM | POA: Insufficient documentation

## 2013-10-06 DIAGNOSIS — F17200 Nicotine dependence, unspecified, uncomplicated: Secondary | ICD-10-CM | POA: Insufficient documentation

## 2013-10-06 MED ORDER — LEVETIRACETAM 500 MG PO TABS
500.0000 mg | ORAL_TABLET | Freq: Once | ORAL | Status: AC
  Administered 2013-10-06: 500 mg via ORAL
  Filled 2013-10-06: qty 1

## 2013-10-06 NOTE — Discharge Instructions (Signed)
Medication Refill, Emergency Department  We have refilled your medication today as a courtesy to you. It is best for your medical care, however, to take care of getting refills done through your primary caregiver's office. They have your records and can do a better job of follow-up than we can in the emergency department.  On maintenance medications, we often only prescribe enough medications to get you by until you are able to see your regular caregiver. This is a more expensive way to refill medications.  In the future, please plan for refills so that you will not have to use the emergency department for this.  Thank you for your help. Your help allows us to better take care of the daily emergencies that enter our department.  Document Released: 10/28/2003 Document Revised: 10/03/2011 Document Reviewed: 07/11/2005  [employer]® Patient Information ©2014 [employer].

## 2013-10-06 NOTE — ED Provider Notes (Signed)
CSN: [id number]     Arrival date & time 10/06/13  2042 History  This chart was scribed for non-physician practitioner Antony Madura, PA-C, working with Richardean Canal, MD by Dorothey Baseman, ED Scribe. This patient was seen in room WTR5/WTR5 and the patient's care was started at 8:53 PM.    Chief Complaint  Patient presents with  . Medication Refill   The history is provided by the patient. No language interpreter was used.   HPI Comments: Brittany Hendrix is a 23 y.o. female with a history of seizures who presents to the Emergency Department requesting a refill of her prescription of 500 mg Keppra that she takes twice daily. Patient reports that she took a dose this morning, but only has one pill left. She states that she has a refill to be picked up tomorrow and only wants one dose in the ED so she can take her last pill tomorrow before getting her refill. She states that she has been compliant with her medication, but will sometimes take an extra dose if she is feeling afraid. She denies fever, seizure activity, hallucinations, numbness, paresthesias, weakness. Patient has no other pertinent medical history.   Past Medical History  Diagnosis Date  . Ovarian cyst   . Seizures     Last seizure 5 months ago  . Abortion history   . [id number](657.8)    Past Surgical History  Procedure Laterality Date  . Dilation and curettage of uterus      abortion   Family History  Problem Relation Age of Onset  . Asthma Brother   . Diabetes Mother    History  Substance Use Topics  . Smoking status: Current Every Day Smoker -- 0.50 packs/day for 5 years    Types: Cigarettes  . Smokeless tobacco: Never Used  . Alcohol Use: 0.6 oz/week    1 Cans of beer per week     Comment: OCC   OB History   Grav Para Term Preterm Abortions TAB SAB Ect Mult Living   4 0 0 0 3 1 2 0 0 0      Review of Systems  Constitutional: Negative for fever.  Neurological: Negative for seizures, weakness and numbness.   Psychiatric/Behavioral: Negative for hallucinations.  All other systems reviewed and are negative.   Allergies  Review of patient's allergies indicates no known allergies.  Home Medications   Current Outpatient Rx  Name  Route  Sig  Dispense  Refill  . levETIRAcetam (KEPPRA) 500 MG tablet   Oral   Take 1 tablet (500 mg total) by mouth 2 (two) times daily.   70 tablet   12   . phenazopyridine (PYRIDIUM) 95 MG tablet   Oral   Take 95 mg by mouth 3 (three) times daily as needed (urinary discomfort).         [name] sulfamethoxazole-trimethoprim (SEPTRA DS) 800-160 MG per tablet   Oral   Take 1 tablet by mouth 2 (two) times daily.   6 tablet   0    Triage Vitals: BP 122/64  Pulse 75  Temp(Src) 98.3 F (36.8 C) (Oral)  Resp 16  Ht 5\' 4"  (1.626 m)  Wt 206 lb (93.441 kg)  BMI 35.34 kg/m2  SpO2 99%  LMP 09/16/2013  Physical Exam  Nursing note and vitals reviewed. Constitutional: She is oriented to person, place, and time. She appears well-developed and well-nourished. No distress.  HENT:  Head: Normocephalic and atraumatic.  Mouth/Throat: Oropharynx is clear and moist. No oropharyngeal  exudate.  Eyes: Conjunctivae and EOM are normal. Pupils are equal, round, and reactive to light. No scleral icterus.  Neck: Normal range of motion.  Cardiovascular: Normal rate, regular rhythm and normal heart sounds.   Pulmonary/Chest: Effort normal and breath sounds normal. No respiratory distress.  Musculoskeletal: Normal range of motion.  Neurological: She is alert and oriented to person, place, and time. No cranial nerve deficit.  GCS 15. Patient speaks in full goal oriented sentences. No focal neurologic deficits appreciated. Patient moves extremities without ataxia. She ambulates with normal gait.  Skin: Skin is warm and dry. No rash noted. She is not diaphoretic. No erythema. No pallor.  Psychiatric: She has a normal mood and affect. Her behavior is normal.    ED Course   Procedures (including critical care time)  DIAGNOSTIC STUDIES: Oxygen Saturation is 99% on room air, normal by my interpretation.    COORDINATION OF CARE: 8:57 PM- Will order a dose of 500 mg Keppra in the ED. Discussed treatment plan with patient at bedside and patient verbalized agreement.    Labs Review Labs Reviewed - No data to display Imaging Review No results found.   EKG Interpretation None      MDM   Final diagnoses:  Medication refill  Has run out of medications    23 year old female with a history of epilepsy presents for her nightly dose of Keppra. Patient states she only has one tab of Keppra left and she will need it for the morning as she cannot have her prescription filled until then. [facility] She denies any fevers, seizure activity, aura or hallucinations, numbness/tingling, or weakness. Neurologic exam today is nonfocal. Patient hemodynamically stable and appropriate for discharge with instruction to have her prescription filled in the morning. She has been given her 500 mg dose of Keppra today prior to discharge. Return precautions provided and patient agreeable to plan with no unaddressed concerns.  I personally performed the services described in this documentation, which was scribed in my presence. The recorded information has been reviewed and is accurate.      Antony MaduraKelly Holle Sprick, PA-C 10/06/13 2104

## 2013-10-06 NOTE — ED Notes (Signed)
Pt reports she only has one of her Keppra pills left and needs a refill. Pt a&O x4, NAD noted at this time

## 2013-10-10 NOTE — ED Provider Notes (Signed)
Medical screening examination/treatment/procedure(s) were performed by non-physician practitioner and as supervising physician I was immediately available for consultation/collaboration.   EKG Interpretation None        Richardean Canalavid H Thandiwe Siragusa, MD 10/10/13 [phone]

## 2013-10-14 NOTE — ED Notes (Signed)
Chart returned from EDP office with orders written for patient to return for  Rocephin 250 mg IM  By Dr Fredderick PhenixBelfi

## 2013-10-15 ENCOUNTER — Encounter (HOSPITAL_COMMUNITY): Payer: Self-pay | Admitting: Emergency Medicine

## 2013-10-15 ENCOUNTER — Emergency Department (HOSPITAL_COMMUNITY)
Admission: EM | Admit: 2013-10-15 | Discharge: 2013-10-15 | Disposition: A | Discharge status: Home / Self Care | Payer: Self-pay | Attending: Emergency Medicine | Admitting: Emergency Medicine

## 2013-10-15 DIAGNOSIS — F17200 Nicotine dependence, unspecified, uncomplicated: Secondary | ICD-10-CM | POA: Insufficient documentation

## 2013-10-15 DIAGNOSIS — Z3202 Encounter for pregnancy test, result negative: Secondary | ICD-10-CM | POA: Insufficient documentation

## 2013-10-15 DIAGNOSIS — B9689 Other specified bacterial agents as the cause of diseases classified elsewhere: Secondary | ICD-10-CM | POA: Insufficient documentation

## 2013-10-15 DIAGNOSIS — Z791 Long term (current) use of non-steroidal anti-inflammatories (NSAID): Secondary | ICD-10-CM | POA: Insufficient documentation

## 2013-10-15 DIAGNOSIS — N946 Dysmenorrhea, unspecified: Secondary | ICD-10-CM | POA: Insufficient documentation

## 2013-10-15 DIAGNOSIS — G40909 Epilepsy, unspecified, not intractable, without status epilepticus: Secondary | ICD-10-CM | POA: Insufficient documentation

## 2013-10-15 DIAGNOSIS — A499 Bacterial infection, unspecified: Secondary | ICD-10-CM | POA: Insufficient documentation

## 2013-10-15 DIAGNOSIS — Z79899 Other long term (current) drug therapy: Secondary | ICD-10-CM | POA: Insufficient documentation

## 2013-10-15 DIAGNOSIS — N760 Acute vaginitis: Secondary | ICD-10-CM | POA: Insufficient documentation

## 2013-10-15 LAB — URINE MICROSCOPIC-ADD ON

## 2013-10-15 LAB — URINALYSIS, ROUTINE W REFLEX MICROSCOPIC
Bilirubin Urine: NEGATIVE
Glucose, UA: NEGATIVE mg/dL
KETONES UR: NEGATIVE mg/dL
Nitrite: NEGATIVE
PROTEIN: NEGATIVE mg/dL
Specific Gravity, Urine: 1.04 — ABNORMAL HIGH (ref 1.005–1.030)
UROBILINOGEN UA: 1 mg/dL (ref 0.0–1.0)
pH: 6.5 (ref 5.0–8.0)

## 2013-10-15 LAB — WET PREP, GENITAL
Trich, Wet Prep: NONE SEEN
WBC, Wet Prep HPF POC: NONE SEEN
Yeast Wet Prep HPF POC: NONE SEEN

## 2013-10-15 LAB — PREGNANCY, URINE: PREG TEST UR: NEGATIVE

## 2013-10-15 MED ORDER — IBUPROFEN 600 MG PO TABS: 600.0000 mg | ORAL_TABLET | Freq: Four times a day (QID) | ORAL | Status: DC | PRN

## 2013-10-15 MED ORDER — METRONIDAZOLE 500 MG PO TABS: 500.0000 mg | ORAL_TABLET | Freq: Two times a day (BID) | ORAL | Status: DC

## 2013-10-15 MED ORDER — METRONIDAZOLE 500 MG PO TABS
500.0000 mg | ORAL_TABLET | Freq: Once | ORAL | Status: AC
  Administered 2013-10-15: 500 mg via ORAL
  Filled 2013-10-15: qty 1

## 2013-10-15 MED ORDER — OXYCODONE-ACETAMINOPHEN 5-325 MG PO TABS: 1.0000 | ORAL_TABLET | Freq: Four times a day (QID) | ORAL | Status: DC | PRN

## 2013-10-15 NOTE — ED Notes (Signed)
Per EMS pt started with abd cramping; started menstrual cycle;

## 2013-10-15 NOTE — ED Provider Notes (Signed)
4:24 PM Patient aware of urinalysis results.  She'll be started on Flagyl.  She is in no distress, laughing, smiling.  Gerhard Munchobert Delaney Schnick, MD 10/15/13 [phone]

## 2013-10-15 NOTE — ED Provider Notes (Signed)
CSN: [id number]     Arrival date & time 10/15/13  1258 History   First MD Initiated Contact with Patient 10/15/13 1428     Chief Complaint  Patient presents with  . Abdominal Pain     (Consider location/radiation/quality/duration/timing/severity/associated sxs/prior Treatment) Patient is a 23 y.o. female presenting with abdominal pain. The history is provided by the patient.  Abdominal Pain Associated symptoms: vaginal bleeding   Associated symptoms: no chest pain, no diarrhea, no nausea, no shortness of breath and no vomiting    patient presents with lower abdominal pain this began today. She states she also began her menses today. She was due to start yesterday. She has a crampy pain like her previous menses, but is more severe. No fevers. No dysuria. No nausea vomiting or diarrhea. She hopes she is not pregnant. Past Medical History  Diagnosis Date  . Ovarian cyst   . Seizures     Last seizure 5 months ago  . Abortion history   . [id number](272.5)    Past Surgical History  Procedure Laterality Date  . Dilation and curettage of uterus      abortion   Family History  Problem Relation Age of Onset  . Asthma Brother   . Diabetes Mother    History  Substance Use Topics  . Smoking status: Current Every Day Smoker -- 0.50 packs/day for 5 years    Types: Cigarettes  . Smokeless tobacco: Never Used  . Alcohol Use: 0.6 oz/week    1 Cans of beer per week     Comment: OCC   OB History   Grav Para Term Preterm Abortions TAB SAB Ect Mult Living   4 0 0 0 3 1 2 0 0 0      Review of Systems  Constitutional: Negative for activity change and appetite change.  Eyes: Negative for pain.  Respiratory: Negative for chest tightness and shortness of breath.   Cardiovascular: Negative for chest pain and leg swelling.  Gastrointestinal: Positive for abdominal pain. Negative for nausea, vomiting and diarrhea.  Genitourinary: Positive for vaginal bleeding and menstrual problem. Negative for  flank pain.  Musculoskeletal: Negative for back pain and neck stiffness.  Skin: Negative for rash.  Neurological: Negative for weakness, numbness and headaches.  Psychiatric/Behavioral: Negative for behavioral problems.      Allergies  Review of patient's allergies indicates no known allergies.  Home Medications   Current Outpatient Rx  Name  Route  Sig  Dispense  Refill  . levETIRAcetam (KEPPRA) 500 MG tablet   Oral   Take 1 tablet (500 mg total) by mouth 2 (two) times daily.   70 tablet   12   . naproxen sodium (ANAPROX) 220 MG tablet   Oral   Take 440 mg by mouth once.         . phenazopyridine (PYRIDIUM) 95 MG tablet   Oral   Take 95 mg by mouth 3 (three) times daily as needed (urinary discomfort).         [name] ibuprofen (ADVIL,MOTRIN) 600 MG tablet   Oral   Take 1 tablet (600 mg total) by mouth every 6 (six) hours as needed.   20 tablet   0   . oxyCODONE-acetaminophen (PERCOCET/ROXICET) 5-325 MG per tablet   Oral   Take 1-2 tablets by mouth every 6 (six) hours as needed for severe pain.   8 tablet   0   . sulfamethoxazole-trimethoprim (SEPTRA DS) 800-160 MG per tablet   Oral   Take 1  tablet by mouth 2 (two) times daily.   6 tablet   0    BP 113/58  Pulse 64  Temp(Src) 98.2 F (36.8 C) (Oral)  Resp 16  SpO2 99%  LMP 10/15/2013 Physical Exam  Nursing note and vitals reviewed. Constitutional: She is oriented to person, place, and time. She appears well-developed and well-nourished.  HENT:  Head: Normocephalic and atraumatic.  Eyes: EOM are normal. Pupils are equal, round, and reactive to light.  Neck: Normal range of motion. Neck supple.  Cardiovascular: Normal rate, regular rhythm and normal heart sounds.   No murmur heard. Pulmonary/Chest: Effort normal and breath sounds normal. No respiratory distress. She has no wheezes. She has no rales.  Abdominal: Soft. Bowel sounds are normal. She exhibits no distension. There is tenderness. There is no  rebound and no guarding.  Left-sided suprapubic tenderness. No rebound guarding. No hernias.  Genitourinary:  Mild bloody vaginal discharge. Mild pain with movement of cervix. No adnexal tenderness or  Musculoskeletal: Normal range of motion.  Neurological: She is alert and oriented to person, place, and time. No cranial nerve deficit.  Skin: Skin is warm and dry.  Psychiatric: She has a normal mood and affect. Her speech is normal.    ED Course  Procedures (including critical care time) Labs Review Labs Reviewed  WET PREP, GENITAL - Abnormal; Notable for the following:    Clue Cells Wet Prep HPF POC FEW (*)    All other components within normal limits  GC/CHLAMYDIA PROBE AMP  PREGNANCY, URINE  URINALYSIS, ROUTINE W REFLEX MICROSCOPIC   Imaging Review No results found.   EKG Interpretation None      MDM   Final diagnoses:  Dysmenorrhea    Patient with pain with her menses. Benign pelvic exam. Not pregnant. Will discharge home with medication.    Juliet RudeNathan R. Rubin PayorPickering, MD 10/15/13 1535

## 2013-10-15 NOTE — Discharge Instructions (Signed)
Dysmenorrhea °Menstrual cramps (dysmenorrhea) are caused by the muscles of the uterus tightening (contracting) during a menstrual period. For some women, this discomfort is merely bothersome. For others, dysmenorrhea can be severe enough to interfere with everyday activities for a few days each month. °Primary dysmenorrhea is menstrual cramps that last a couple of days when you start having menstrual periods or soon after. This often begins after a teenager starts having her period. As a woman gets older or has a baby, the cramps will usually lessen or disappear. Secondary dysmenorrhea begins later in life, lasts longer, and the pain may be stronger than primary dysmenorrhea. The pain may start before the period and last a few days after the period.  °CAUSES  °Dysmenorrhea is usually caused by an underlying problem, such as: °· The tissue lining the uterus grows outside of the uterus in other areas of the body (endometriosis). °· The endometrial tissue, which normally lines the uterus, is found in or grows into the muscular walls of the uterus (adenomyosis). °· The pelvic blood vessels are engorged with blood just before the menstrual period (pelvic congestive syndrome). °· Overgrowth of cells (polyps) in the lining of the uterus or cervix. °· Falling down of the uterus (prolapse) because of loose or stretched ligaments. °· Depression. °· Bladder problems, infection, or inflammation. °· Problems with the intestine, a tumor, or irritable bowel syndrome. °· Cancer of the female organs or bladder. °· A severely tipped uterus. °· A very tight opening or closed cervix. °· Noncancerous tumors of the uterus (fibroids). °· Pelvic inflammatory disease (PID). °· Pelvic scarring (adhesions) from a previous surgery. °· Ovarian cyst. °· An intrauterine device (IUD) used for birth control. °RISK FACTORS °You may be at greater risk of dysmenorrhea if: °· You are younger than age 30. °· You started puberty early. °· You have  irregular or heavy bleeding. °· You have never given birth. °· You have a family history of this problem. °· You are a smoker. °SIGNS AND SYMPTOMS  °· Cramping or throbbing pain in your lower abdomen. °· Headaches. °· Lower back pain. °· Nausea or vomiting. °· Diarrhea. °· Sweating or dizziness. °· Loose stools. °DIAGNOSIS  °A diagnosis is based on your history, symptoms, physical exam, diagnostic tests, or procedures. Diagnostic tests or procedures may include: °· Blood tests. °· Ultrasonography. °· An examination of the lining of the uterus (dilation and curettage, D&C). °· An examination inside your abdomen or pelvis with a scope (laparoscopy). °· X-rays. °· CT scan. °· MRI. °· An examination inside the bladder with a scope (cystoscopy). °· An examination inside the intestine or stomach with a scope (colonoscopy, gastroscopy). °TREATMENT  °Treatment depends on the cause of the dysmenorrhea. Treatment may include: °· Pain medicine prescribed by your health care provider. °· Birth control pills or an IUD with progesterone hormone in it. °· Hormone replacement therapy. °· Nonsteroidal anti-inflammatory drugs (NSAIDs). These may help stop the production of prostaglandins. °· Surgery to remove adhesions, endometriosis, ovarian cyst, or fibroids. °· Removal of the uterus (hysterectomy). °· Progesterone shots to stop the menstrual period. °· Cutting the nerves on the sacrum that go to the female organs (presacral neurectomy). °· Electric current to the sacral nerves (sacral nerve stimulation). °· Antidepressant medicine. °· Psychiatric therapy, counseling, or group therapy. °· Exercise and physical therapy. °· Meditation and yoga therapy. °· Acupuncture. °HOME CARE INSTRUCTIONS  °· Only take over-the-counter or prescription medicines as directed by your health care provider. °· Place a heating pad   or hot water bottle on your lower back or abdomen. Do not sleep with the heating pad.  Use aerobic exercises, walking,  swimming, biking, and other exercises to help lessen the cramping.  Massage to the lower back or abdomen may help.  Stop smoking.  Avoid alcohol and caffeine. SEEK MEDICAL CARE IF:   Your pain does not get better with medicine.  You have pain with sexual intercourse.  Your pain increases and is not controlled with medicines.  You have abnormal vaginal bleeding with your period.  You develop nausea or vomiting with your period that is not controlled with medicine. SEEK IMMEDIATE MEDICAL CARE IF:  You pass out.  Document Released: 07/11/2005 Document Revised: 03/13/2013 Document Reviewed: 12/27/2012 [facility] Patient Information 2014 [place], [place].  Bacterial Vaginosis Bacterial vaginosis is a vaginal infection that occurs when the normal balance of bacteria in the vagina is disrupted. It results from an overgrowth of certain bacteria. This is the most common vaginal infection in women of childbearing age. Treatment is important to prevent complications, especially in pregnant women, as it can cause a premature delivery. CAUSES  Bacterial vaginosis is caused by an increase in harmful bacteria that are normally present in smaller amounts in the vagina. Several different kinds of bacteria can cause bacterial vaginosis. However, the reason that the condition develops is not fully understood. RISK FACTORS Certain activities or behaviors can put you at an increased risk of developing bacterial vaginosis, including:  Having a new sex partner or multiple sex partners.  Douching.  Using an intrauterine device (IUD) for contraception. Women do not get bacterial vaginosis from toilet seats, bedding, swimming pools, or contact with objects around them. SIGNS AND SYMPTOMS  Some women with bacterial vaginosis have no signs or symptoms. Common symptoms include:  Grey vaginal discharge.  A fishlike odor with discharge, especially after sexual intercourse.  Itching or burning of the vagina  and vulva.  Burning or pain with urination. DIAGNOSIS  Your health care provider will take a medical history and examine the vagina for signs of bacterial vaginosis. A sample of vaginal fluid may be taken. Your health care provider will look at this sample under a microscope to check for bacteria and abnormal cells. A vaginal pH test may also be done.  TREATMENT  Bacterial vaginosis may be treated with antibiotic medicines. These may be given in the form of a pill or a vaginal cream. A second round of antibiotics may be prescribed if the condition comes back after treatment.  HOME CARE INSTRUCTIONS   Only take over-the-counter or prescription medicines as directed by your health care provider.  If antibiotic medicine was prescribed, take it as directed. Make sure you finish it even if you start to feel better.  Do not have sex until treatment is completed.  Tell all sexual partners that you have a vaginal infection. They should see their health care provider and be treated if they have problems, such as a mild rash or itching.  Practice safe sex by using condoms and only having one sex partner. SEEK MEDICAL CARE IF:   Your symptoms are not improving after 3 days of treatment.  You have increased discharge or pain.  You have a fever. MAKE SURE YOU:   Understand these instructions.  Will watch your condition.  Will get help right away if you are not doing well or get worse. FOR MORE INFORMATION  Centers for Disease Control and Prevention, Division of STD Prevention: [employer] American Sexual Health Association (  ASHA): www.ashastd.org  Document Released: 07/11/2005 Document Revised: 05/01/2013 Document Reviewed: 02/20/2013 [facility] Patient Information 2014 [place].

## 2013-10-16 LAB — GC/CHLAMYDIA PROBE AMP
CT PROBE, AMP APTIMA: NEGATIVE
GC Probe RNA: NEGATIVE

## 2013-10-20 NOTE — Telephone Encounter (Signed)
Unable to contact patient via phone. Sent letter. °

## 2013-12-06 ENCOUNTER — Inpatient Hospital Stay (HOSPITAL_COMMUNITY)
Admission: AD | Admit: 2013-12-06 | Discharge: 2013-12-06 | Disposition: A | Discharge status: Home / Self Care | Payer: Self-pay | Source: Ambulatory Visit | Attending: Obstetrics & Gynecology | Admitting: Obstetrics & Gynecology

## 2013-12-06 DIAGNOSIS — Z3202 Encounter for pregnancy test, result negative: Secondary | ICD-10-CM | POA: Insufficient documentation

## 2013-12-06 LAB — URINALYSIS, ROUTINE W REFLEX MICROSCOPIC
BILIRUBIN URINE: NEGATIVE
Glucose, UA: NEGATIVE mg/dL
Hgb urine dipstick: NEGATIVE
KETONES UR: NEGATIVE mg/dL
LEUKOCYTES UA: NEGATIVE
Nitrite: NEGATIVE
Protein, ur: NEGATIVE mg/dL
Specific Gravity, Urine: 1.02 (ref 1.005–1.030)
UROBILINOGEN UA: 0.2 mg/dL (ref 0.0–1.0)
pH: 7.5 (ref 5.0–8.0)

## 2013-12-06 LAB — HCG, QUANTITATIVE, PREGNANCY

## 2013-12-06 LAB — POCT PREGNANCY, URINE
PREG TEST UR: NEGATIVE
PREG TEST UR: NEGATIVE

## 2013-12-06 NOTE — MAU Provider Note (Signed)
HPI:  [name] is a 23 y.o. female [id number] who presents for pregnancy test encounter. The patient had a negative a faint positive at home this week. The patient desires pregnancy and has been trying to get pregnant for 3 months.   Objective:  GENERAL: Well-developed, well-nourished female in no acute distress.  HEENT: Normocephalic, atraumatic.   LUNGS: Effort normal HEART: Regular rate  SKIN: Warm, dry and without erythema PSYCH: Normal mood and affect  Filed Vitals:   12/06/13 1313  BP: 119/66  Pulse: 79  Temp: 97.9 F (36.6 C)  Resp: 16    Results for orders placed during the hospital encounter of 12/06/13 (from the past 48 hour(s))  URINALYSIS, ROUTINE W REFLEX MICROSCOPIC     Status: None   Collection Time    12/06/13 12:05 PM      Result Value Ref Range   Color, Urine YELLOW  YELLOW   APPearance CLEAR  CLEAR   Specific Gravity, Urine 1.020  1.005 - 1.030   pH 7.5  5.0 - 8.0   Glucose, UA NEGATIVE  NEGATIVE mg/dL   Hgb urine dipstick NEGATIVE  NEGATIVE   Bilirubin Urine NEGATIVE  NEGATIVE   Ketones, ur NEGATIVE  NEGATIVE mg/dL   Protein, ur NEGATIVE  NEGATIVE mg/dL   Urobilinogen, UA 0.2  0.0 - 1.0 mg/dL   Nitrite NEGATIVE  NEGATIVE   Leukocytes, UA NEGATIVE  NEGATIVE   Comment: MICROSCOPIC NOT DONE ON URINES WITH NEGATIVE PROTEIN, BLOOD, LEUKOCYTES, NITRITE, OR GLUCOSE <1000 mg/dL.  POCT PREGNANCY, URINE     Status: None   Collection Time    12/06/13 12:44 PM      Result Value Ref Range   Preg Test, Ur NEGATIVE  NEGATIVE   Comment:            THE SENSITIVITY OF THIS     METHODOLOGY IS >24 mIU/mL  POCT PREGNANCY, URINE     Status: None   Collection Time    12/06/13 12:45 PM      Result Value Ref Range   Preg Test, Ur NEGATIVE  NEGATIVE   Comment:            THE SENSITIVITY OF THIS     METHODOLOGY IS >24 mIU/mL  HCG, QUANTITATIVE, PREGNANCY     Status: None   Collection Time    12/06/13  1:10 PM      Result Value Ref Range   hCG, Beta  Chain, Quant, S <1  <5 mIU/mL   Comment:              GEST. AGE      CONC.  (mIU/mL)       <=1 WEEK        5 - 50         2 WEEKS       50 - 500         3 WEEKS       100 - 10,000         4 WEEKS     1,000 - 30,000         5 WEEKS     3,500 - 115,000       6-8 WEEKS     12,000 - 270,000        12 WEEKS     15,000 - 220,000                FEMALE AND NON-PREGNANT FEMALE:  LESS THAN 5 mIU/mL     REPEATED TO VERIFY    MDM: Urine pregnancy test negative Quant   A:  1. Encounter for pregnancy test with result negative    P:  Discharge home in stable condition Encouraged at home pregnancy testing Support given   Iona HansenJennifer Irene Najiyah Paris, NP 12/06/2013 3:56 PM

## 2013-12-06 NOTE — MAU Note (Signed)
Pt states cycle last month came on 2 days late (11/17/2013) and took two pregnancy tests. One positive one negative. Does note increased frequency with voiding.

## 2013-12-11 ENCOUNTER — Encounter (HOSPITAL_COMMUNITY): Payer: Self-pay | Admitting: Emergency Medicine

## 2013-12-11 ENCOUNTER — Emergency Department (HOSPITAL_COMMUNITY)
Admission: EM | Admit: 2013-12-11 | Discharge: 2013-12-12 | Disposition: A | Discharge status: Home / Self Care | Payer: Self-pay | Attending: Emergency Medicine | Admitting: Emergency Medicine

## 2013-12-11 DIAGNOSIS — Z79899 Other long term (current) drug therapy: Secondary | ICD-10-CM | POA: Insufficient documentation

## 2013-12-11 DIAGNOSIS — Z791 Long term (current) use of non-steroidal anti-inflammatories (NSAID): Secondary | ICD-10-CM | POA: Insufficient documentation

## 2013-12-11 DIAGNOSIS — R569 Unspecified convulsions: Secondary | ICD-10-CM

## 2013-12-11 DIAGNOSIS — Z8639 Personal history of other endocrine, nutritional and metabolic disease: Secondary | ICD-10-CM | POA: Insufficient documentation

## 2013-12-11 DIAGNOSIS — F17200 Nicotine dependence, unspecified, uncomplicated: Secondary | ICD-10-CM | POA: Insufficient documentation

## 2013-12-11 DIAGNOSIS — G40909 Epilepsy, unspecified, not intractable, without status epilepticus: Secondary | ICD-10-CM | POA: Insufficient documentation

## 2013-12-11 DIAGNOSIS — Z862 Personal history of diseases of the blood and blood-forming organs and certain disorders involving the immune mechanism: Secondary | ICD-10-CM | POA: Insufficient documentation

## 2013-12-11 LAB — CBC WITH DIFFERENTIAL/PLATELET
Basophils Absolute: 0 10*3/uL (ref 0.0–0.1)
Basophils Relative: 0 % (ref 0–1)
Eosinophils Absolute: 0.2 10*3/uL (ref 0.0–0.7)
Eosinophils Relative: 2 % (ref 0–5)
HEMATOCRIT: 36.3 % (ref 36.0–46.0)
Hemoglobin: 12.3 g/dL (ref 12.0–15.0)
LYMPHS PCT: 23 % (ref 12–46)
Lymphs Abs: 2 10*3/uL (ref 0.7–4.0)
MCH: 31 pg (ref 26.0–34.0)
MCHC: 33.9 g/dL (ref 30.0–36.0)
MCV: 91.4 fL (ref 78.0–100.0)
MONO ABS: 0.5 10*3/uL (ref 0.1–1.0)
Monocytes Relative: 6 % (ref 3–12)
NEUTROS ABS: 6.1 10*3/uL (ref 1.7–7.7)
Neutrophils Relative %: 69 % (ref 43–77)
Platelets: 271 10*3/uL (ref 150–400)
RBC: 3.97 MIL/uL (ref 3.87–5.11)
RDW: 12.2 % (ref 11.5–15.5)
WBC: 8.7 10*3/uL (ref 4.0–10.5)

## 2013-12-11 MED ORDER — IBUPROFEN 200 MG PO TABS
600.0000 mg | ORAL_TABLET | Freq: Once | ORAL | Status: AC
  Administered 2013-12-11: 600 mg via ORAL
  Filled 2013-12-11: qty 3

## 2013-12-11 MED ORDER — LORAZEPAM 1 MG PO TABS
1.0000 mg | ORAL_TABLET | Freq: Once | ORAL | Status: AC
  Administered 2013-12-11: 1 mg via ORAL
  Filled 2013-12-11: qty 1

## 2013-12-11 NOTE — ED Provider Notes (Signed)
CSN: [id number]     Arrival date & time 12/11/13  2305 History  This chart was scribed for non-physician practitioner Earley FavorGail Iyanni Hepp, NP, working with Olivia Mackielga M Otter, MD, by Yevette EdwardsAngela Bracken, ED Scribe. This patient was seen in room WA16/WA16 and the patient's care was started at 11:17 PM.   First MD Initiated Contact with Patient 12/11/13 2316     Chief Complaint  Patient presents with  . Seizures    HPI HPI Comments: Beckie SaltsLorena B Hendrix is a [age] y.o. female, with a h/o seizures, who presents to the Emergency Department via EMS complaining of a seizure which occurred PTA. The pt endorses a headache, and she states headaches are common for her after seizures. She denies any other pain. Ms. Bradly BienenstockMartinez reports she has been taking her medication as prescribed; she took Keppra two hours ago this evening; she takes 500 mg of Keppra once a day.; she is unsure when her Keppra levels were last assessed. The pt reports she had a break-through seizure last week as well, though she also states that break-though seizures are not common. Dr. Terrace ArabiaYan is her neurologist.   Past Medical History  Diagnosis Date  . Ovarian cyst   . Seizures     Last seizure 5 months ago  . Abortion history   . WUJWJXBJ(478.2Headache(784.0)    Past Surgical History  Procedure Laterality Date  . Dilation and curettage of uterus      abortion   Family History  Problem Relation Age of Onset  . Asthma Brother   . Diabetes Mother    History  Substance Use Topics  . Smoking status: Current Every Day Smoker -- 0.50 packs/day for 5 years    Types: Cigarettes  . Smokeless tobacco: Never Used  . Alcohol Use: 0.6 oz/week    1 Cans of beer per week     Comment: OCC   OB History   Grav Para Term Preterm Abortions TAB SAB Ect Mult Living   4 0 0 0 3 1 2 0 0 0      Review of Systems  Constitutional: Negative for fever and chills.  HENT: Negative for congestion and rhinorrhea.   Respiratory: Negative for cough.   Neurological: Positive for seizures  and headaches.  All other systems reviewed and are negative.   Allergies  Review of patient's allergies indicates no known allergies.  Home Medications   Prior to Admission medications   Medication Sig Start Date End Date Taking? Authorizing Provider  ibuprofen (ADVIL,MOTRIN) 600 MG tablet Take 1 tablet (600 mg total) by mouth every 6 (six) hours as needed. 10/15/13   Juliet RudeNathan R. Pickering, MD  levETIRAcetam (KEPPRA) 500 MG tablet Take 1 tablet (500 mg total) by mouth 2 (two) times daily. 12/21/12   Levert FeinsteinYijun Yan, MD  metroNIDAZOLE (FLAGYL) 500 MG tablet Take 1 tablet (500 mg total) by mouth 2 (two) times daily. 10/15/13   Gerhard Munchobert Lockwood, MD  naproxen sodium (ANAPROX) 220 MG tablet Take 440 mg by mouth once.    Historical Provider, MD  oxyCODONE-acetaminophen (PERCOCET/ROXICET) 5-325 MG per tablet Take 1-2 tablets by mouth every 6 (six) hours as needed for severe pain. 10/15/13   Juliet RudeNathan R. Pickering, MD  phenazopyridine (PYRIDIUM) 95 MG tablet Take 95 mg by mouth 3 (three) times daily as needed (urinary discomfort).    Historical Provider, MD   Triage Vitals: BP 125/61  Temp(Src) 98.7 F (37.1 C)  Resp 12  Ht 5\' 4"  (1.626 m)  Wt 210 lb (95.255 kg)  BMI 36.03 kg/m2  SpO2 99%  LMP 11/17/2013  Physical Exam  Nursing note and vitals reviewed. Constitutional: She is oriented to person, place, and time. She appears well-developed and well-nourished. No distress.  HENT:  Head: Normocephalic and atraumatic.  Right Ear: External ear normal.  Left Ear: External ear normal.  Eyes: EOM are normal. Pupils are equal, round, and reactive to light.  Neck: Neck supple. No tracheal deviation present.  Cardiovascular: Normal rate and regular rhythm.   No murmur heard. Pulmonary/Chest: Effort normal and breath sounds normal. No respiratory distress. She has no wheezes.  Abdominal: Soft. There is no tenderness.  Musculoskeletal: Normal range of motion.  Neurological: She is alert and oriented to person,  place, and time.  Skin: Skin is warm and dry.  Psychiatric: She has a normal mood and affect. Her behavior is normal.    ED Course  Procedures (including critical care time)  DIAGNOSTIC STUDIES: Oxygen Saturation is 99% on room air, normal by my interpretation.    COORDINATION OF CARE:  11:22 PM- Discussed treatment plan with patient, and the patient agreed to the plan.   Labs Review Labs Reviewed  URINE RAPID DRUG SCREEN (HOSP PERFORMED) - Abnormal; Notable for the following:    Tetrahydrocannabinol POSITIVE (*)    All other components within normal limits  CBC WITH DIFFERENTIAL  LEVETIRACETAM LEVEL  I-STAT CHEM 8, ED    Imaging Review No results found.   EKG Interpretation None      MDM  Will check CBC i-STAT, the level, and urine drug screen.  Will give ibuprofen for patient's complaint of headache. Patient does not know the last time.  The level was checked she is not complaining of any discomfort.  She says she has break through seizures occasionally Patient's lab and urine have been checked.  There is no indication for infection.  She has been given an additional dose of Keppra 500 mg in the emergency room as well as 1 mg of, Ativan.  There's been no further seizure activity, and instructed to follow up with her neurologist to discuss her breakthrough seizures and Final diagnoses:  Seizure       I personally performed the services described in this documentation, which was scribed in my presence. The recorded information has been reviewed and is accurate.     Arman FilterGail K Zeyad Delaguila, NP 12/12/13 [phone]

## 2013-12-11 NOTE — ED Notes (Signed)
Bed: [id number] Expected date: 12/11/13 Expected time: 10:51 PM Means of arrival: Ambulance Comments: seizure

## 2013-12-11 NOTE — ED Notes (Signed)
Pt had 2 min convulsive seizure tonight according to mother. Last seizure last week. Alert and oriented. No trauma noted. No head trauma.

## 2013-12-12 ENCOUNTER — Telehealth: Payer: Self-pay | Admitting: Neurology

## 2013-12-12 DIAGNOSIS — R569 Unspecified convulsions: Secondary | ICD-10-CM

## 2013-12-12 LAB — RAPID URINE DRUG SCREEN, HOSP PERFORMED
Amphetamines: NOT DETECTED
BARBITURATES: NOT DETECTED
BENZODIAZEPINES: NOT DETECTED
COCAINE: NOT DETECTED
Opiates: NOT DETECTED
TETRAHYDROCANNABINOL: POSITIVE — AB

## 2013-12-12 MED ORDER — LEVETIRACETAM 500 MG PO TABS
500.0000 mg | ORAL_TABLET | Freq: Once | ORAL | Status: AC
  Administered 2013-12-12: 500 mg via ORAL
  Filled 2013-12-12: qty 1

## 2013-12-12 NOTE — ED Provider Notes (Signed)
Medical screening examination/treatment/procedure(s) were performed by non-physician practitioner and as supervising physician I was immediately available for consultation/collaboration.   EKG Interpretation None       Olivia Mackielga M Sheree Lalla, MD 12/12/13 [id number]

## 2013-12-12 NOTE — Discharge Instructions (Signed)
Seizure, Adult A seizure is abnormal electrical activity in the brain. Seizures usually last from 30 seconds to 2 minutes. There are various types of seizures. Before a seizure, you may have a warning sensation (aura) that a seizure is about to occur. An aura may include the following symptoms:   Fear or anxiety.  Nausea.  Feeling like the room is spinning (vertigo).  Vision changes, such as seeing flashing lights or spots. Common symptoms during a seizure include:  A change in attention or behavior (altered mental status).  Convulsions with rhythmic jerking movements.  Drooling.  Rapid eye movements.  Grunting.  Loss of bladder and bowel control.  Bitter taste in the mouth.  Tongue biting. After a seizure, you may feel confused and sleepy. You may also have an injury resulting from convulsions during the seizure. HOME CARE INSTRUCTIONS   If you are given medicines, take them exactly as prescribed by your health care provider.  Keep all follow-up appointments as directed by your health care provider.  Do not swim or drive or engage in risky activity during which a seizure could cause further injury to you or others until your health care provider says it is OK.  Get adequate rest.  Teach friends and family what to do if you have a seizure. They should:  Lay you on the ground to prevent a fall.  Put a cushion under your head.  Loosen any tight clothing around your neck.  Turn you on your side. If vomiting occurs, this helps keep your airway clear.  Stay with you until you recover.  Know whether or not you need emergency care. SEEK IMMEDIATE MEDICAL CARE IF:  The seizure lasts longer than 5 minutes.  The seizure is severe or you do not wake up immediately after the seizure.  You have an altered mental status after the seizure.  You are having more frequent or worsening seizures. Someone should drive you to the emergency department or call local emergency  services (911 in [place] MAKE SURE YOU:  Understand these instructions.  Will watch your condition.  Will get help right away if you are not doing well or get worse. Document Released: 07/08/2000 Document Revised: 05/01/2013 Document Reviewed: 02/20/2013 [facility] Patient Information 2014 [place], [place]. Make an appointment with your neurologist, to discuss your breakthrough seizures, and perhaps increasing your Keppra to  twice a day

## 2013-12-12 NOTE — Telephone Encounter (Signed)
Patient called and stated levETIRAcetam (KEPPRA) 500 MG tablet isn't helping with seizures.  Went to ER last night and was told medication may need to be increased.  Please call and advise. thanks

## 2013-12-13 MED ORDER — LEVETIRACETAM 500 MG PO TABS: ORAL_TABLET | ORAL | Status: DC

## 2013-12-13 NOTE — Telephone Encounter (Signed)
I have called her, left message to her mother, she continued to have seizure, she should take Keppra 500 mg one in the morning, 2 at evening,  Came in next Tuesday for trough level

## 2013-12-14 LAB — LEVETIRACETAM LEVEL: Levetiracetam Lvl: 13.5 ug/mL

## 2013-12-17 ENCOUNTER — Other Ambulatory Visit (INDEPENDENT_AMBULATORY_CARE_PROVIDER_SITE_OTHER): Payer: Self-pay

## 2013-12-17 DIAGNOSIS — Z0289 Encounter for other administrative examinations: Secondary | ICD-10-CM

## 2013-12-17 DIAGNOSIS — R569 Unspecified convulsions: Secondary | ICD-10-CM

## 2013-12-19 LAB — LEVETIRACETAM LEVEL: Levetiracetam Lvl: 14.1 ug/mL (ref 5.0–63.0)

## 2013-12-20 ENCOUNTER — Telehealth: Payer: Self-pay

## 2013-12-20 NOTE — Telephone Encounter (Signed)
Called patient and left message to call back because we need to reschedule her appt with Dr.Yan. Stated to patient to call back and ask to speak to me So I can get her appt. With in same week.

## 2013-12-23 ENCOUNTER — Ambulatory Visit: Payer: Self-pay | Admitting: Neurology

## 2013-12-25 NOTE — Progress Notes (Signed)
Quick Note:  Shared normal labs with patient per Dr Zannie Cove findings, patient verbalized understanding ______

## 2013-12-27 ENCOUNTER — Encounter: Payer: Self-pay | Admitting: Neurology

## 2013-12-27 ENCOUNTER — Other Ambulatory Visit: Payer: Self-pay | Admitting: Neurology

## 2013-12-27 ENCOUNTER — Ambulatory Visit (INDEPENDENT_AMBULATORY_CARE_PROVIDER_SITE_OTHER): Payer: Self-pay | Admitting: Neurology

## 2013-12-27 VITALS — BP 105/67 | HR 84 | Temp 98.7°F | Ht 64.0 in | Wt 216.0 lb

## 2013-12-27 DIAGNOSIS — R569 Unspecified convulsions: Secondary | ICD-10-CM

## 2013-12-27 NOTE — Progress Notes (Signed)
HPI:   Brittany Hendrix is a 23 year old female, who presents to the office with a history of seizures.   She first had seizures when she was 62-2 years old.  All seizure has similar seminology. She has an aura before those seizures,  where she feels drowsy and sometimes nauseated, lasting few minutes, followed by loss of conciousness, whole body shaking, then post ictal phase, were she is confused for about 20-30 minutes after the seizure.   She has injured herself in the face and on her leg  during those seizures in the past. She was never on any antiseizure medication, until after her  seizure  in May 2012, she was put on Levetiracetam 500 mg b.i.d. 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  her bloodwork tested positive for marijuana.   Last seizure was in Jul 2013, she had few 3-4 recurrent seizure while not taking her keppra. She has no recurrent seizure if she is taking her Keppra 500 mg twice a day, she is self-pay, [employer] has been very expensive for her, sometimes she has to pay it week by week, She does not drive, lives with her mother,  Repeat MRI in 2013 was within normal limits. 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 EEG was normal  UPDATE June 5th 2015: Last visit was in May 2014, she has two recurrent seizure 2 weeks ago, precede by dizziness, fainting sensation, then her body started to jump, with LOC, she has two seizures in one day, while taking keppra 500mg  bid, compliance with her medication   Review of Systems  Out of a complete 14 system review, the patient complains of only the following symptoms, and all other reviewed systems are negative.  Musculoskeletal: Joint pain       Physical Exam  General: Patient is well developed and  well groomed, OVERWEIGHT. Patient is awake and alert and in no acute distress. Head: Symmetric facial features.  Neck: Symmetric  Respiratory: LCA Cardiovascular: Regular rate and rhythm with no murmurs.  Skin: No rash, no bruising  Neurologic Exam  Mental Status: Awake, alert and oriented to person, place and time. Recent and remote memory, attention span, concentration and fund of knowledge are normal. Language is fluent and comprehensive. Cranial Nerves: Pupils are equal and round and reactive to light. Conjugate eye movements are full and symmetric. Visual fields are full to confrontation. No evidence of papilledema or other changes on funduscopy. Facial sensation and facial muscle strength are symmetric and in normal limits. Hearing is intact. Palate elevated symmetrically and uvula is midline. Shoulder shrug is symmetric. Tongue is midline. Motor: Symmetric normal motor tone is noted throughout. Normal muscle bulk. Testing reveals 5/5  muscle strength of the upper extremity, and 5/5 for the lower extremity.  Fine motor movements are normal in both hands. Sensory: Intact and symmetric to light touch, pinprick, temperature, vibration and proprioception. Coordination: Cerebellar testing reveals good finger-to-nose and heel-to-shin bilaterally. No tremor, dystaxia or dysmetria noted. Gait and Station: Arises from chair without difficulty.  Narrow based gait with normal arm swing bilateral, able to walk on heels and toes.   Tandem walk is stable. Reflexes: DTR in the upper and lower extremity are present and symmetric 2+. Babinski response is down going.  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 A MRI performed last year was without significant findings, except a small arachnoid cyst in the posterior fossa on the right.    continue  Levetiracetam 500mg  am/2x500mg  qhs. We will try medical assistant program Return to clinic in one year with Brittany Hendrix

## 2013-12-27 NOTE — Telephone Encounter (Signed)
I called the patient.  Gave her the number for [employer] [phone].  She will contact them regarding Keppra to see if she qualifies for the program.

## 2013-12-27 NOTE — Telephone Encounter (Signed)
Brittany Hendrix, she is taking Keppra generic 500 mg 1 in the morning, 2 at night, can you help her to get medical assistant program, it is okay to use brand name Keppra

## 2014-01-16 ENCOUNTER — Telehealth (HOSPITAL_BASED_OUTPATIENT_CLINIC_OR_DEPARTMENT_OTHER): Payer: Self-pay | Admitting: Emergency Medicine

## 2014-01-20 ENCOUNTER — Emergency Department (HOSPITAL_COMMUNITY)
Admission: EM | Admit: 2014-01-20 | Discharge: 2014-01-20 | Discharge status: Against Medical Advice | Payer: Self-pay | Attending: Emergency Medicine | Admitting: Emergency Medicine

## 2014-01-20 ENCOUNTER — Encounter (HOSPITAL_COMMUNITY): Payer: Self-pay | Admitting: Emergency Medicine

## 2014-01-20 DIAGNOSIS — N949 Unspecified condition associated with female genital organs and menstrual cycle: Secondary | ICD-10-CM | POA: Insufficient documentation

## 2014-01-20 DIAGNOSIS — F17200 Nicotine dependence, unspecified, uncomplicated: Secondary | ICD-10-CM | POA: Insufficient documentation

## 2014-01-20 DIAGNOSIS — Z3202 Encounter for pregnancy test, result negative: Secondary | ICD-10-CM | POA: Insufficient documentation

## 2014-01-20 LAB — URINALYSIS, ROUTINE W REFLEX MICROSCOPIC
BILIRUBIN URINE: NEGATIVE
Glucose, UA: NEGATIVE mg/dL
HGB URINE DIPSTICK: NEGATIVE
Ketones, ur: NEGATIVE mg/dL
Leukocytes, UA: NEGATIVE
Nitrite: NEGATIVE
PROTEIN: NEGATIVE mg/dL
Specific Gravity, Urine: 1.022 (ref 1.005–1.030)
UROBILINOGEN UA: 0.2 mg/dL (ref 0.0–1.0)
pH: 8 (ref 5.0–8.0)

## 2014-01-20 LAB — POC URINE PREG, ED: Preg Test, Ur: NEGATIVE

## 2014-01-20 NOTE — ED Notes (Signed)
Per pt, lower abdominal pelvic pain for 1 week.  Pt states menstrual cycle less than normal.  Pain with intercourse.  Discharge noted.

## 2014-01-20 NOTE — ED Notes (Signed)
Pt not found in lobby at this time.  Will recheck later

## 2014-02-09 ENCOUNTER — Emergency Department (HOSPITAL_COMMUNITY)
Admission: EM | Admit: 2014-02-09 | Discharge: 2014-02-10 | Disposition: A | Discharge status: Home / Self Care | Payer: Self-pay | Attending: Emergency Medicine | Admitting: Emergency Medicine

## 2014-02-09 ENCOUNTER — Encounter (HOSPITAL_COMMUNITY): Payer: Self-pay | Admitting: Emergency Medicine

## 2014-02-09 DIAGNOSIS — F17200 Nicotine dependence, unspecified, uncomplicated: Secondary | ICD-10-CM | POA: Insufficient documentation

## 2014-02-09 DIAGNOSIS — A499 Bacterial infection, unspecified: Secondary | ICD-10-CM | POA: Insufficient documentation

## 2014-02-09 DIAGNOSIS — G40909 Epilepsy, unspecified, not intractable, without status epilepticus: Secondary | ICD-10-CM | POA: Insufficient documentation

## 2014-02-09 DIAGNOSIS — N760 Acute vaginitis: Secondary | ICD-10-CM | POA: Insufficient documentation

## 2014-02-09 DIAGNOSIS — Z3202 Encounter for pregnancy test, result negative: Secondary | ICD-10-CM | POA: Insufficient documentation

## 2014-02-09 DIAGNOSIS — Z79899 Other long term (current) drug therapy: Secondary | ICD-10-CM | POA: Insufficient documentation

## 2014-02-09 DIAGNOSIS — B9689 Other specified bacterial agents as the cause of diseases classified elsewhere: Secondary | ICD-10-CM | POA: Insufficient documentation

## 2014-02-09 NOTE — ED Notes (Signed)
Pt states she is having pelvic pain that started around June 26th  Pt states she was seen here on the 29th but had to leave before her results came back   Pt states tonight the pain got worse  Pt states she was at work and it felt like she pulled something in her pelvic region

## 2014-02-10 ENCOUNTER — Emergency Department (HOSPITAL_COMMUNITY): Payer: Self-pay

## 2014-02-10 LAB — URINALYSIS, ROUTINE W REFLEX MICROSCOPIC
Bilirubin Urine: NEGATIVE
Glucose, UA: NEGATIVE mg/dL
Hgb urine dipstick: NEGATIVE
Ketones, ur: NEGATIVE mg/dL
Leukocytes, UA: NEGATIVE
Nitrite: NEGATIVE
Protein, ur: NEGATIVE mg/dL
Specific Gravity, Urine: 1.022 (ref 1.005–1.030)
Urobilinogen, UA: 1 mg/dL (ref 0.0–1.0)
pH: 6 (ref 5.0–8.0)

## 2014-02-10 LAB — WET PREP, GENITAL
Trich, Wet Prep: NONE SEEN
Yeast Wet Prep HPF POC: NONE SEEN

## 2014-02-10 LAB — PREGNANCY, URINE: Preg Test, Ur: NEGATIVE

## 2014-02-10 MED ORDER — METRONIDAZOLE 500 MG PO TABS: 500.0000 mg | ORAL_TABLET | Freq: Two times a day (BID) | ORAL | Status: DC

## 2014-02-10 NOTE — Discharge Instructions (Signed)
Return here as needed. Follow up with the [facility] provided. You have a bacterial infection that is causing your symptoms.

## 2014-02-10 NOTE — ED Provider Notes (Signed)
CSN: [id number]     Arrival date & time 02/09/14  2319 History   First MD Initiated Contact with Patient 02/09/14 2331     Chief Complaint  Patient presents with  . Pelvic Pain     (Consider location/radiation/quality/duration/timing/severity/associated sxs/prior Treatment) HPI Patient presents to the emergency department with pelvic pain started on June 26.  Patient, states, that she was seen in the emergency department for this pain, but was unable to stay for the full testing.  Patient, states, that she's not had any vaginal bleeding, weakness, dizziness, back pain, dysuria, vaginal discharge, chest pain, shortness of breath, fever, incontinence, constipation, diarrhea, nausea, vomiting, or syncope.  The patient, states, that he not take any medications prior to arrival.  Nothing seems make her condition, better and palpation makes the pain, worse.  She states that it felt like she pulled something in her lower pelvic region and is brought her to the hospital Past Medical History  Diagnosis Date  . Seizures     Last seizure 5 months ago  . Abortion history   . WUJWJXBJ(478.2Headache(784.0)    Past Surgical History  Procedure Laterality Date  . Dilation and curettage of uterus      abortion   Family History  Problem Relation Age of Onset  . Asthma Brother   . Diabetes Mother    History  Substance Use Topics  . Smoking status: Current Every Day Smoker -- 0.50 packs/day for 5 years    Types: Cigarettes  . Smokeless tobacco: Never Used  . Alcohol Use: No   OB History   Grav Para Term Preterm Abortions TAB SAB Ect Mult Living   4 0 0 0 3 1 2 0 0 0      Review of Systems  All other systems negative except as documented in the HPI. All pertinent positives and negatives as reviewed in the HPI.  Allergies  Review of patient's allergies indicates no known allergies.  Home Medications   Prior to Admission medications   Medication Sig Start Date End Date Taking? Authorizing Provider    levETIRAcetam (KEPPRA) 500 MG tablet One po qam and 2 tabs po qhs 12/27/13   Levert FeinsteinYijun Yan, MD   BP 130/76  Pulse 90  Temp(Src) 98.6 F (37 C) (Oral)  Resp 18  SpO2 100%  LMP 01/11/2014 Physical Exam  Constitutional: She is oriented to person, place, and time. She appears well-developed and well-nourished. No distress.  HENT:  Head: Normocephalic and atraumatic.  Mouth/Throat: Oropharynx is clear and moist.  Eyes: Pupils are equal, round, and reactive to light.  Neck: Normal range of motion. Neck supple.  Cardiovascular: Normal rate, regular rhythm and normal heart sounds.  Exam reveals no gallop and no friction rub.   No murmur heard. Pulmonary/Chest: Effort normal and breath sounds normal. No respiratory distress.  Abdominal: Soft. Normal appearance and bowel sounds are normal. She exhibits no distension. There is tenderness. There is no rebound and no guarding.    Neurological: She is alert and oriented to person, place, and time.  Skin: Skin is warm and dry.  Psychiatric: She has a normal mood and affect. Her behavior is normal. Thought content normal.    ED Course  Procedures (including critical care time) Labs Review Labs Reviewed  GC/CHLAMYDIA PROBE AMP  PREGNANCY, URINE  URINALYSIS, ROUTINE W REFLEX MICROSCOPIC    The patient will be treated for BV and referred to [facility] told to return here as needed.      Jamesetta Orleanshristopher W  Kingsly Kloepfer, PA-C 02/10/14 [phone]

## 2014-02-11 LAB — GC/CHLAMYDIA PROBE AMP
CT Probe RNA: NEGATIVE
GC Probe RNA: NEGATIVE

## 2014-02-13 NOTE — ED Provider Notes (Signed)
Medical screening examination/treatment/procedure(s) were performed by non-physician practitioner and as supervising physician I was immediately available for consultation/collaboration.   EKG Interpretation None       Derrien Anschutz, MD 02/13/14 1310 

## 2014-02-21 NOTE — Telephone Encounter (Signed)
Done, patient has been seen on 12/27/13 rescheduled from 12/23/13

## 2014-03-13 ENCOUNTER — Emergency Department (HOSPITAL_COMMUNITY)
Admission: EM | Admit: 2014-03-13 | Discharge: 2014-03-14 | Disposition: A | Discharge status: Home / Self Care | Payer: Worker's Compensation | Attending: Emergency Medicine | Admitting: Emergency Medicine

## 2014-03-13 ENCOUNTER — Encounter (HOSPITAL_COMMUNITY): Payer: Self-pay | Admitting: Emergency Medicine

## 2014-03-13 DIAGNOSIS — Y9289 Other specified places as the place of occurrence of the external cause: Secondary | ICD-10-CM | POA: Insufficient documentation

## 2014-03-13 DIAGNOSIS — T23109A Burn of first degree of unspecified hand, unspecified site, initial encounter: Secondary | ICD-10-CM | POA: Insufficient documentation

## 2014-03-13 DIAGNOSIS — X131XXA Other contact with steam and other hot vapors, initial encounter: Secondary | ICD-10-CM | POA: Diagnosis not present

## 2014-03-13 DIAGNOSIS — T23101A Burn of first degree of right hand, unspecified site, initial encounter: Secondary | ICD-10-CM

## 2014-03-13 DIAGNOSIS — F17200 Nicotine dependence, unspecified, uncomplicated: Secondary | ICD-10-CM | POA: Insufficient documentation

## 2014-03-13 DIAGNOSIS — G40909 Epilepsy, unspecified, not intractable, without status epilepticus: Secondary | ICD-10-CM | POA: Insufficient documentation

## 2014-03-13 DIAGNOSIS — Y9389 Activity, other specified: Secondary | ICD-10-CM | POA: Diagnosis not present

## 2014-03-13 DIAGNOSIS — X12XXXA Contact with other hot fluids, initial encounter: Secondary | ICD-10-CM | POA: Insufficient documentation

## 2014-03-13 DIAGNOSIS — T23131A Burn of first degree of multiple right fingers (nail), not including thumb, initial encounter: Secondary | ICD-10-CM

## 2014-03-13 NOTE — ED Notes (Signed)
Pt presents with c/o right hand burn. Pt reports she was filtering some grease at work and her hand went into the filter where the grease is. Mild redness only to that hand.

## 2014-03-14 MED ORDER — SILVER SULFADIAZINE 1 % EX CREA
TOPICAL_CREAM | Freq: Once | CUTANEOUS | Status: AC
  Administered 2014-03-14: 01:00:00 via TOPICAL
  Filled 2014-03-14: qty 50

## 2014-03-14 MED ORDER — OXYCODONE-ACETAMINOPHEN 5-325 MG PO TABS: 1.0000 | ORAL_TABLET | Freq: Four times a day (QID) | ORAL | Status: DC | PRN

## 2014-03-14 MED ORDER — OXYCODONE-ACETAMINOPHEN 5-325 MG PO TABS
1.0000 | ORAL_TABLET | Freq: Once | ORAL | Status: AC
  Administered 2014-03-14: 1 via ORAL
  Filled 2014-03-14: qty 1

## 2014-03-14 NOTE — ED Provider Notes (Signed)
Medical screening examination/treatment/procedure(s) were performed by non-physician practitioner and as supervising physician I was immediately available for consultation/collaboration.   EKG Interpretation None       Idris Edmundson M Darla Mcdonald, MD 03/14/14 0515 

## 2014-03-14 NOTE — ED Provider Notes (Signed)
CSN: [id number]     Arrival date & time 03/13/14  2205 History   First MD Initiated Contact with Patient 03/14/14 0049     Chief Complaint  Patient presents with  . Hand Burn     (Consider location/radiation/quality/duration/timing/severity/associated sxs/prior Treatment) HPI Comments: Patient is a 23 year old female past medical history significant for seizures headaches presenting to the emergency department for a right hand burn that occurred earlier this evening at work. Patient states she was told to increase at work when the top of her hand touched the grease. She had immediate redness and pain. Alleviating factors: none. Aggravating factors: palpation. Medications tried prior to arrival: none.      Past Medical History  Diagnosis Date  . Seizures     Last seizure 5 months ago  . Abortion history   . WUJWJXBJ(478.2)    Past Surgical History  Procedure Laterality Date  . Dilation and curettage of uterus      abortion   Family History  Problem Relation Age of Onset  . Asthma Brother   . Diabetes Mother    History  Substance Use Topics  . Smoking status: Current Every Day Smoker -- 0.50 packs/day for 5 years    Types: Cigarettes  . Smokeless tobacco: Never Used  . Alcohol Use: No   OB History   Grav Para Term Preterm Abortions TAB SAB Ect Mult Living   4 0 0 0 3 1 2 0 0 0      Review of Systems  Skin:       Burn  All other systems reviewed and are negative.     Allergies  Review of patient's allergies indicates no known allergies.  Home Medications   Prior to Admission medications   Medication Sig Start Date End Date Taking? Authorizing Provider  levETIRAcetam (KEPPRA) 500 MG tablet Take 500-1,000 mg by mouth 2 (two) times daily. 1 tablet in the morning and 2 tablets at night.   Yes Historical Provider, MD  oxyCODONE-acetaminophen (PERCOCET) 5-325 MG per tablet Take 1-2 tablets by mouth every 6 (six) hours as needed for severe pain. 03/14/14   Rushil Kimbrell L  Jacaria Colburn, PA-C   BP 107/73  Pulse 61  Temp(Src) 98.6 F (37 C) (Oral)  Resp 16  SpO2 100%  LMP 03/13/2014 Physical Exam  Nursing note and vitals reviewed. Constitutional: She is oriented to person, place, and time. She appears well-developed and well-nourished. No distress.  HENT:  Head: Normocephalic and atraumatic.  Right Ear: External ear normal.  Left Ear: External ear normal.  Nose: Nose normal.  Mouth/Throat: Oropharynx is clear and moist. No oropharyngeal exudate.  Eyes: Conjunctivae are normal.  Neck: Normal range of motion. Neck supple.  Cardiovascular: Normal rate, regular rhythm, normal heart sounds and intact distal pulses.   Pulmonary/Chest: Effort normal and breath sounds normal. No respiratory distress.  Abdominal: Soft. There is no tenderness.  Musculoskeletal: Normal range of motion.       Right wrist: Normal.       Right hand: She exhibits tenderness. She exhibits normal range of motion, no bony tenderness, normal capillary refill, no laceration and no swelling. Normal sensation noted. Normal strength noted.       Hands: Neurological: She is alert and oriented to person, place, and time.  Skin: Skin is warm and dry. Burn noted. She is not diaphoretic.  Psychiatric: She has a normal mood and affect.    ED Course  Procedures (including critical care time) Medications  oxyCODONE-acetaminophen (PERCOCET/ROXICET) 5-325  MG per tablet 1 tablet (1 tablet Oral Given 03/14/14 0112)  silver sulfADIAZINE (SILVADENE) 1 % cream ( Topical Given 03/14/14 0112)    Labs Review Labs Reviewed - No data to display  Imaging Review No results found.   EKG Interpretation None      MDM   Final diagnoses:  First degree burn of right hand including fingers, initial encounter    Filed Vitals:   03/14/14 0128  BP: 107/73  Pulse: 61  Temp:   Resp: 16   Afebrile, NAD, non-toxic appearing, AAOx4.  Neurovascularly intact. Normal sensation. First degree burn noted  from NCP joints the fingers. There is non-circumferential. Range of motion is intact. No blistering. Will treat with pain management. Silvadene cream applied and given to patient to go home with. Return precautions discussed. Patient is agreeable to plan. Patient is stable at time of discharge. Patient d/w with Dr. Norlene Campbelltter, agrees with plan.       Jeannetta EllisJennifer L Nedda Gains, PA-C 03/14/14 [phone]

## 2014-03-14 NOTE — Discharge Instructions (Signed)
Please follow up with your primary care physician in 1-2 days. If you do not have one please call the [facility] number listed above. Please use the Silvadene cream as prescribed. Please take pain medication and/or muscle relaxants as prescribed and as needed for pain. Please do not drive on narcotic pain medication or on muscle relaxants. Please read all discharge instructions and return precautions.    Burn Care Your skin is a natural barrier to infection. It is the largest organ of your body. Burns damage this natural protection. To help prevent infection, it is very important to follow your caregiver's instructions in the care of your burn. Burns are classified as:  First degree. There is only redness of the skin (erythema). No scarring is expected.  Second degree. There is blistering of the skin. Scarring may occur with deeper burns.  Third degree. All layers of the skin are injured, and scarring is expected. HOME CARE INSTRUCTIONS   Wash your hands well before changing your bandage.  Change your bandage as often as directed by your caregiver.  Remove the old bandage. If the bandage sticks, you may soak it off with cool, clean water.  Cleanse the burn thoroughly but gently with mild soap and water.  Pat the area dry with a clean, dry cloth.  Apply a thin layer of antibacterial cream to the burn.  Apply a clean bandage as instructed by your caregiver.  Keep the bandage as clean and dry as possible.  Elevate the affected area for the first 24 hours, then as instructed by your caregiver.  Only take over-the-counter or prescription medicines for pain, discomfort, or fever as directed by your caregiver. SEEK IMMEDIATE MEDICAL CARE IF:   You develop excessive pain.  You develop redness, tenderness, swelling, or red streaks near the burn.  The burned area develops yellowish-white fluid (pus) or a bad smell.  You have a fever. MAKE SURE YOU:   Understand  these instructions.  Will watch your condition.  Will get help right away if you are not doing well or get worse. Document Released: 07/11/2005 Document Revised: 10/03/2011 Document Reviewed: 12/01/2010 [facility] Patient Information 2015 [place], [place]. This information is not intended to replace advice given to you by your health care provider. Make sure you discuss any questions you have with your health care provider.

## 2014-03-26 ENCOUNTER — Telehealth: Payer: Self-pay | Admitting: Neurology

## 2014-03-26 NOTE — Telephone Encounter (Signed)
Patient calling to request that a letter be written for her school to explain that she suffers from seizures and that this was why she had to withdraw from school in 2011. Please call patient and advise.

## 2014-05-26 ENCOUNTER — Encounter (HOSPITAL_COMMUNITY): Payer: Self-pay | Admitting: Emergency Medicine

## 2014-09-10 ENCOUNTER — Emergency Department (HOSPITAL_COMMUNITY)
Admission: EM | Admit: 2014-09-10 | Discharge: 2014-09-11 | Disposition: A | Discharge status: Home / Self Care | Payer: Self-pay | Attending: Emergency Medicine | Admitting: Emergency Medicine

## 2014-09-10 ENCOUNTER — Encounter (HOSPITAL_COMMUNITY): Payer: Self-pay | Admitting: Emergency Medicine

## 2014-09-10 DIAGNOSIS — G40909 Epilepsy, unspecified, not intractable, without status epilepticus: Secondary | ICD-10-CM | POA: Insufficient documentation

## 2014-09-10 DIAGNOSIS — Z79899 Other long term (current) drug therapy: Secondary | ICD-10-CM | POA: Insufficient documentation

## 2014-09-10 DIAGNOSIS — Z3202 Encounter for pregnancy test, result negative: Secondary | ICD-10-CM | POA: Insufficient documentation

## 2014-09-10 DIAGNOSIS — Z720 Tobacco use: Secondary | ICD-10-CM | POA: Insufficient documentation

## 2014-09-10 LAB — POC URINE PREG, ED: PREG TEST UR: NEGATIVE

## 2014-09-10 MED ORDER — LEVETIRACETAM 500 MG PO TABS
1000.0000 mg | ORAL_TABLET | Freq: Once | ORAL | Status: AC
  Administered 2014-09-10: 1000 mg via ORAL
  Filled 2014-09-10: qty 2

## 2014-09-10 MED ORDER — LORAZEPAM 1 MG PO TABS
1.0000 mg | ORAL_TABLET | Freq: Once | ORAL | Status: AC
Start: 2014-09-10 — End: 2014-09-10
  Administered 2014-09-10: 1 mg via ORAL
  Filled 2014-09-10: qty 1

## 2014-09-10 NOTE — ED Notes (Signed)
Per ems pt from home , per ems pt felt near occurrence of seizure. Hx brain tumor and seizure. Last seizure 6 mths ago. Takes Keppra daily. Denies pain, pt is alert and oriented.

## 2014-09-10 NOTE — ED Notes (Addendum)
Pt alert, NAD, calm, interactive, "HA better", denies sx other than HA. [facility]

## 2014-09-10 NOTE — ED Notes (Signed)
Bed: WA01 Expected date:  Expected time:  Means of arrival:  Comments: EMS hx brain tumor/having aura

## 2014-09-10 NOTE — ED Provider Notes (Signed)
CSN: [id number]     Arrival date & time 09/10/14  2218 History   First MD Initiated Contact with Patient 09/10/14 2308     Chief Complaint  Patient presents with  . seizure aura      (Consider location/radiation/quality/duration/timing/severity/associated sxs/prior Treatment) HPI 24 year old female presents emergency department with feeling that she either had a seizure or is about to have a seizure.  Patient has history of seizure disorder, arachnoid cyst.  Last seizure was 7 or 8 months ago.  Patient reports that she takes Keppra, takes 500 mg in the morning, 500 mg around 9:30 and another 500 mg just before bed.  She has not taken her evening doses of Keppra.  Patient reports that she laid down to take a nap, and woke around 9 PM feeling unwell.  She reports feeling slightly headachy and as though she was about to have a seizure.  Patient reports at this time she is feeling better.  Patient is followed by Dr. Terrace ArabiaYan.  No other complaints at this time. Past Medical History  Diagnosis Date  . Seizures     Last seizure 5 months ago  . Abortion history   . XBJYNWGN(562.1Headache(784.0)    Past Surgical History  Procedure Laterality Date  . Dilation and curettage of uterus      abortion   Family History  Problem Relation Age of Onset  . Asthma Brother   . Diabetes Mother    History  Substance Use Topics  . Smoking status: Current Every Day Smoker -- 0.50 packs/day for 5 years    Types: Cigarettes  . Smokeless tobacco: Never Used  . Alcohol Use: No   OB History    Gravida Para Term Preterm AB TAB SAB Ectopic Multiple Living   4 0 0 0 3 1 2 0 0 0      Review of Systems  See History of Present Illness; otherwise all other systems are reviewed and negative   Allergies  Review of patient's allergies indicates no known allergies.  Home Medications   Prior to Admission medications   Medication Sig Start Date End Date Taking? Authorizing Provider  ibuprofen (ADVIL,MOTRIN) 200 MG tablet Take  600 mg by mouth every 6 (six) hours as needed for moderate pain.   Yes Historical Provider, MD  levETIRAcetam (KEPPRA) 500 MG tablet Take 500-1,000 mg by mouth 2 (two) times daily. 1 tablet in the morning and 2 tablets at night.   Yes Historical Provider, MD  oxyCODONE-acetaminophen (PERCOCET) 5-325 MG per tablet Take 1-2 tablets by mouth every 6 (six) hours as needed for severe pain. Patient not taking: Reported on 09/10/2014 03/14/14   Victorino DikeJennifer L Piepenbrink, PA-C   BP 104/62 mmHg  Pulse 70  Temp(Src) 98.6 F (37 C) (Oral)  Resp 16  SpO2 94% Physical Exam  Constitutional: She is oriented to person, place, and time. She appears well-developed and well-nourished. No distress.  HENT:  Head: Normocephalic and atraumatic.  Nose: Nose normal.  Mouth/Throat: Oropharynx is clear and moist.  Eyes: Conjunctivae and EOM are normal. Pupils are equal, round, and reactive to light.  Neck: Normal range of motion. Neck supple. No JVD present. No tracheal deviation present. No thyromegaly present.  Cardiovascular: Normal rate, regular rhythm, normal heart sounds and intact distal pulses.  Exam reveals no gallop and no friction rub.   No murmur heard. Pulmonary/Chest: Effort normal and breath sounds normal. No stridor. No respiratory distress. She has no wheezes. She has no rales. She exhibits no tenderness.  Abdominal: Soft. Bowel sounds are normal. She exhibits no distension and no mass. There is no tenderness. There is no rebound and no guarding.  Musculoskeletal: Normal range of motion. She exhibits no edema or tenderness.  Lymphadenopathy:    She has no cervical adenopathy.  Neurological: She is alert and oriented to person, place, and time. She displays normal reflexes. No cranial nerve deficit. She exhibits normal muscle tone. Coordination normal.  Skin: Skin is warm and dry. No rash noted. No erythema. No pallor.  Psychiatric: She has a normal mood and affect. Her behavior is normal. Judgment and  thought content normal.  Nursing note and vitals reviewed.   ED Course  Procedures (including critical care time) Labs Review Labs Reviewed  POC URINE PREG, ED    Imaging Review No results found.   EKG Interpretation None      MDM   Final diagnoses:  Seizure disorder    24 year old female with history of seizure disorder who has an aura as though she is going to have a seizure earlier in evening.  Currently asymptomatic.  We'll give her her evening dose of Keppra as well as a milligram of Ativan.  Patient requesting pregnancy test.    Olivia Mackie, MD 09/11/14 [phone]

## 2014-09-11 NOTE — Discharge Instructions (Signed)
Take medications as prescribed.  Your pregnancy test today was negative.  Follow-up with your neurologist.   Epilepsy People with epilepsy have times when they shake and jerk uncontrollably (seizures). This happens when there is a sudden change in brain function. Epilepsy may have many possible causes. Anything that disturbs the normal pattern of brain cell activity can lead to seizures. HOME CARE   Follow your doctor's instructions about driving and safety during normal activities.  Get enough sleep.  Only take medicine as told by your doctor.  Avoid things that you know can cause you to have seizures (triggers).  Write down when your seizures happen and what you remember about each seizure. Write down anything you think may have caused the seizure to happen.  Tell the people you live and work with that you have seizures. Make sure they know how to help you. They should:  Cushion your head and body.  Turn you on your side.  Not restrain you.  Not place anything inside your mouth.  Call for local emergency medical help if there is any question about what has happened.  Keep all follow-up visits with your doctor. This is very important. GET HELP IF:  You get an infection or start to feel sick. You may have more seizures when you are sick.  You are having seizures more often.  Your seizure pattern is changing. GET HELP RIGHT AWAY IF:   A seizure does not stop after a few seconds or minutes.  A seizure causes you to have trouble breathing.  A seizure gives you a very bad headache.  A seizure makes you unable to speak or use a part of your body. Document Released: 05/08/2009 Document Revised: 05/01/2013 Document Reviewed: 02/20/2013 [facility] Patient Information 2015 [place], [place]. This information is not intended to replace advice given to you by your health care provider. Make sure you discuss any questions you have with your health care provider.  Driving and  Equipment Restrictions Some medical problems make it dangerous to drive, ride a bike, or use machines. Some of these problems are:  A hard blow to the head (concussion).  Passing out (fainting).  Twitching and shaking (seizures).  Low blood sugar.  Taking medicine to help you relax (sedatives).  Taking pain medicines.  Wearing an eye patch.  Wearing splints. This can make it hard to use parts of your body that you need to drive safely. HOME CARE   Do not drive until your doctor says it is okay.  Do not use machines until your doctor says it is okay. You may need a form signed by your doctor (medical release) before you can drive again. You may also need this form before you do other tasks where you need to be fully alert. MAKE SURE YOU:  Understand these instructions.  Will watch your condition.  Will get help right away if you are not doing well or get worse. Document Released: 08/18/2004 Document Revised: 10/03/2011 Document Reviewed: 11/18/2009 [facility] Patient Information 2015 [place], [place]. This information is not intended to replace advice given to you by your health care provider. Make sure you discuss any questions you have with your health care provider.

## 2014-09-11 NOTE — ED Notes (Signed)
Patient verbalizes understanding of discharge instructions, home care and follow up care. Patient ambulatory out of department at this time with significant other.

## 2014-10-06 ENCOUNTER — Encounter (HOSPITAL_COMMUNITY): Payer: Self-pay

## 2014-10-06 ENCOUNTER — Emergency Department (HOSPITAL_COMMUNITY)
Admission: EM | Admit: 2014-10-06 | Discharge: 2014-10-06 | Disposition: A | Discharge status: Home / Self Care | Payer: Self-pay | Attending: Emergency Medicine | Admitting: Emergency Medicine

## 2014-10-06 DIAGNOSIS — Z79899 Other long term (current) drug therapy: Secondary | ICD-10-CM | POA: Insufficient documentation

## 2014-10-06 DIAGNOSIS — G40909 Epilepsy, unspecified, not intractable, without status epilepticus: Secondary | ICD-10-CM | POA: Insufficient documentation

## 2014-10-06 DIAGNOSIS — Z720 Tobacco use: Secondary | ICD-10-CM | POA: Insufficient documentation

## 2014-10-06 DIAGNOSIS — R21 Rash and other nonspecific skin eruption: Secondary | ICD-10-CM | POA: Insufficient documentation

## 2014-10-06 MED ORDER — HYDROCORTISONE 1 % EX CREA: TOPICAL_CREAM | CUTANEOUS | Status: DC

## 2014-10-06 MED ORDER — DIPHENHYDRAMINE HCL 25 MG PO TABS: 25.0000 mg | ORAL_TABLET | Freq: Four times a day (QID) | ORAL | Status: DC

## 2014-10-06 NOTE — ED Provider Notes (Signed)
CSN: [id number]     Arrival date & time 10/06/14  1039 History   First MD Initiated Contact with Patient 10/06/14 1053     Chief Complaint  Patient presents with  . Insect Bite     (Consider location/radiation/quality/duration/timing/severity/associated sxs/prior Treatment) HPI Brittany Hendrix is a 24 y.o. female who comes in for evaluation of insect bite. Patient states yesterday afternoon she was sitting on her porch when she saw "something that looked like a mosquito" bit me on my left arm and left hip. She has tried hydrogen peroxide and rubbing alcohol with some relief. She reports a mild itching at this point she rates as a 4/10. She denies any fevers, chills, nausea or vomiting, abdominal pain or diarrhea or constipation. Denies any rashes or sores anywhere else. Denies any recent travel, medication changes, new detergents or soaps. No other aggravating or modifying factors.  Past Medical History  Diagnosis Date  . Seizures     Last seizure 5 months ago  . Abortion history   . WUJWJXBJ(478.2Headache(784.0)    Past Surgical History  Procedure Laterality Date  . Dilation and curettage of uterus      abortion   Family History  Problem Relation Age of Onset  . Asthma Brother   . Diabetes Mother    History  Substance Use Topics  . Smoking status: Current Every Day Smoker -- 0.50 packs/day for 5 years    Types: Cigarettes  . Smokeless tobacco: Never Used  . Alcohol Use: No   OB History    Gravida Para Term Preterm AB TAB SAB Ectopic Multiple Living   4 0 0 0 3 1 2 0 0 0      Review of Systems  Constitutional: Negative for fever.  Respiratory: Negative for shortness of breath.   Cardiovascular: Negative for chest pain.  Skin: Positive for rash.  Allergic/Immunologic: Negative for environmental allergies, food allergies and immunocompromised state.      Allergies  Review of patient's allergies indicates no known allergies.  Home Medications   Prior to Admission medications    Medication Sig Start Date End Date Taking? Authorizing Provider  diphenhydrAMINE (BENADRYL) 25 MG tablet Take 1 tablet (25 mg total) by mouth every 6 (six) hours. 10/06/14   Joycie PeekBenjamin Lenoard Helbert, PA-C  hydrocortisone cream 1 % Apply to affected area 2 times daily 10/06/14   Joycie PeekBenjamin Torrez Renfroe, PA-C  ibuprofen (ADVIL,MOTRIN) 200 MG tablet Take 600 mg by mouth every 6 (six) hours as needed for moderate pain.    Historical Provider, MD  levETIRAcetam (KEPPRA) 500 MG tablet Take 500-1,000 mg by mouth 2 (two) times daily. 1 tablet in the morning and 2 tablets at night.    Historical Provider, MD  oxyCODONE-acetaminophen (PERCOCET) 5-325 MG per tablet Take 1-2 tablets by mouth every 6 (six) hours as needed for severe pain. Patient not taking: Reported on 09/10/2014 03/14/14   Victorino DikeJennifer Piepenbrink, PA-C   BP 120/70 mmHg  Pulse 83  Temp(Src) 98.3 F (36.8 C) (Oral)  Resp 18  SpO2 100%  LMP 09/11/2014 Physical Exam  Constitutional: She is oriented to person, place, and time. She appears well-developed and well-nourished. No distress.  HENT:  Head: Normocephalic and atraumatic.  Mouth/Throat: Oropharynx is clear and moist. No oropharyngeal exudate.  Eyes: Conjunctivae are normal. Pupils are equal, round, and reactive to light. Right eye exhibits no discharge. Left eye exhibits no discharge. No scleral icterus.  Neck: Normal range of motion. Neck supple.  Cardiovascular: Normal rate, regular rhythm and normal  heart sounds.   Pulmonary/Chest: Effort normal and breath sounds normal. No respiratory distress. She has no wheezes. She has no rales.  Abdominal: Soft. There is no tenderness.  Musculoskeletal: Normal range of motion. She exhibits no tenderness.  Neurological: She is alert and oriented to person, place, and time.  Cranial Nerves II-XII grossly intact  Skin: Skin is warm and dry. No rash noted. She is not diaphoretic.  Posterior aspect of left arm along tricep has 4, small, circumferential,  maculopapular, mildly erythematous lesions. One small pustule. Mild surrounding excoriation. No tracking or evidence of infection. Similar lesion to left hip. All lesions appear to be healing well  Psychiatric: She has a normal mood and affect.  Nursing note and vitals reviewed.   ED Course  Procedures (including critical care time) Labs Review Labs Reviewed - No data to display  Imaging Review No results found.   EKG Interpretation None     Meds given in ED:  Medications - No data to display  New Prescriptions   DIPHENHYDRAMINE (BENADRYL) 25 MG TABLET    Take 1 tablet (25 mg total) by mouth every 6 (six) hours.   HYDROCORTISONE CREAM 1 %    Apply to affected area 2 times daily   Filed Vitals:   10/06/14 1048  BP: 120/70  Pulse: 83  Temp: 98.3 F (36.8 C)  TempSrc: Oral  Resp: 18  SpO2: 100%    MDM  Vitals stable - WNL -afebrile Pt resting comfortably in ED. PE--consistent with insect bite to posterior aspect of left arm and left hip. All lesions appear to be healing well.  DDX--no evidence of systemic infection. We'll treat symptomatically with Benadryl and hydrocortisone cream. No evidence of other acute or emergent pathology.  I discussed all relevant lab findings and imaging results with pt and they verbalized understanding. Discussed f/u with PCP within 48 hrs and return precautions, pt very amenable to plan.  Final diagnoses:  Rash       Joycie Peek, PA-C 10/06/14 1118  Gilda Crease, MD 10/06/14 1124

## 2014-10-06 NOTE — Discharge Instructions (Signed)

## 2014-10-06 NOTE — ED Notes (Signed)
Per pt, insect bites to left arm and left hip.  Pt has been outside.  No new beds or change in sleeping arrangements

## 2014-11-14 ENCOUNTER — Encounter (HOSPITAL_COMMUNITY): Payer: Self-pay | Admitting: Emergency Medicine

## 2014-11-14 ENCOUNTER — Emergency Department (HOSPITAL_COMMUNITY): Payer: Self-pay

## 2014-11-14 ENCOUNTER — Emergency Department (HOSPITAL_COMMUNITY)
Admission: EM | Admit: 2014-11-14 | Discharge: 2014-11-14 | Disposition: A | Discharge status: Home / Self Care | Payer: Self-pay | Attending: Emergency Medicine | Admitting: Emergency Medicine

## 2014-11-14 DIAGNOSIS — S61411A Laceration without foreign body of right hand, initial encounter: Secondary | ICD-10-CM | POA: Insufficient documentation

## 2014-11-14 DIAGNOSIS — Y9289 Other specified places as the place of occurrence of the external cause: Secondary | ICD-10-CM | POA: Insufficient documentation

## 2014-11-14 DIAGNOSIS — Z7952 Long term (current) use of systemic steroids: Secondary | ICD-10-CM | POA: Insufficient documentation

## 2014-11-14 DIAGNOSIS — Y9389 Activity, other specified: Secondary | ICD-10-CM | POA: Insufficient documentation

## 2014-11-14 DIAGNOSIS — S62316A Displaced fracture of base of fifth metacarpal bone, right hand, initial encounter for closed fracture: Secondary | ICD-10-CM | POA: Insufficient documentation

## 2014-11-14 DIAGNOSIS — Z720 Tobacco use: Secondary | ICD-10-CM | POA: Insufficient documentation

## 2014-11-14 DIAGNOSIS — G40909 Epilepsy, unspecified, not intractable, without status epilepticus: Secondary | ICD-10-CM | POA: Insufficient documentation

## 2014-11-14 DIAGNOSIS — Z79899 Other long term (current) drug therapy: Secondary | ICD-10-CM | POA: Insufficient documentation

## 2014-11-14 DIAGNOSIS — S62308A Unspecified fracture of other metacarpal bone, initial encounter for closed fracture: Secondary | ICD-10-CM

## 2014-11-14 DIAGNOSIS — Z23 Encounter for immunization: Secondary | ICD-10-CM | POA: Insufficient documentation

## 2014-11-14 DIAGNOSIS — Y998 Other external cause status: Secondary | ICD-10-CM | POA: Insufficient documentation

## 2014-11-14 DIAGNOSIS — S61210A Laceration without foreign body of right index finger without damage to nail, initial encounter: Secondary | ICD-10-CM | POA: Insufficient documentation

## 2014-11-14 MED ORDER — HYDROCODONE-ACETAMINOPHEN 5-325 MG PO TABS: 1.0000 | ORAL_TABLET | ORAL | Status: DC | PRN

## 2014-11-14 MED ORDER — TETANUS-DIPHTH-ACELL PERTUSSIS 5-2.5-18.5 LF-MCG/0.5 IM SUSP
0.5000 mL | Freq: Once | INTRAMUSCULAR | Status: AC
  Administered 2014-11-14: 0.5 mL via INTRAMUSCULAR
  Filled 2014-11-14: qty 0.5

## 2014-11-14 MED ORDER — HYDROCODONE-ACETAMINOPHEN 5-325 MG PO TABS
1.0000 | ORAL_TABLET | Freq: Once | ORAL | Status: AC
  Administered 2014-11-14: 1 via ORAL
  Filled 2014-11-14: qty 1

## 2014-11-14 MED ORDER — IBUPROFEN 800 MG PO TABS
800.0000 mg | ORAL_TABLET | Freq: Three times a day (TID) | ORAL | Status: DC | PRN
Start: 2014-11-14 — End: 2015-04-20
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
## 2014-11-14 NOTE — ED Notes (Signed)
Bed: WTR7 Expected date:  Expected time:  Means of arrival:  Comments: EMS- hand lac

## 2014-11-14 NOTE — Discharge Instructions (Signed)
Read the information below.  Use the prescribed medication as directed.  Please discuss all new medications with your pharmacist.  Do not take additional tylenol while taking the prescribed pain medication to avoid overdose.  You may return to the Emergency Department at any time for worsening condition or any new symptoms that concern you.   If there is any possibility that you might be pregnant, please let your health care provider know and discuss this with the pharmacist to ensure medication safety.   If you develop uncontrolled pain, weakness or numbness of the extremity, severe discoloration of the skin, or you are unable to move your fingers, return to the ER for a recheck.       Cast or Splint Care Casts and splints support injured limbs and keep bones from moving while they heal. It is important to care for your cast or splint at home.  HOME CARE INSTRUCTIONS  Keep the cast or splint uncovered during the drying period. It can take 24 to 48 hours to dry if it is made of plaster. A fiberglass cast will dry in less than 1 hour.  Do not rest the cast on anything harder than a pillow for the first 24 hours.  Do not put weight on your injured limb or apply pressure to the cast until your health care provider gives you permission.  Keep the cast or splint dry. Wet casts or splints can lose their shape and may not support the limb as well. A wet cast that has lost its shape can also create harmful pressure on your skin when it dries. Also, wet skin can become infected.  Cover the cast or splint with a plastic bag when bathing or when out in the rain or snow. If the cast is on the trunk of the body, take sponge baths until the cast is removed.  If your cast does become wet, dry it with a towel or a blow dryer on the cool setting only.  Keep your cast or splint clean. Soiled casts may be wiped with a moistened cloth.  Do not place any hard or soft foreign objects under your cast or splint, such  as cotton, toilet paper, lotion, or powder.  Do not try to scratch the skin under the cast with any object. The object could get stuck inside the cast. Also, scratching could lead to an infection. If itching is a problem, use a blow dryer on a cool setting to relieve discomfort.  Do not trim or cut your cast or remove padding from inside of it.  Exercise all joints next to the injury that are not immobilized by the cast or splint. For example, if you have a long leg cast, exercise the hip joint and toes. If you have an arm cast or splint, exercise the shoulder, elbow, thumb, and fingers.  Elevate your injured arm or leg on 1 or 2 pillows for the first 1 to 3 days to decrease swelling and pain.It is best if you can comfortably elevate your cast so it is higher than your heart. SEEK MEDICAL CARE IF:   Your cast or splint cracks.  Your cast or splint is too tight or too loose.  You have unbearable itching inside the cast.  Your cast becomes wet or develops a soft spot or area.  You have a bad smell coming from inside your cast.  You get an object stuck under your cast.  Your skin around the cast becomes red or raw.  You have new pain or worsening pain after the cast has been applied. SEEK IMMEDIATE MEDICAL CARE IF:   You have fluid leaking through the cast.  You are unable to move your fingers or toes.  You have discolored (blue or white), cool, painful, or very swollen fingers or toes beyond the cast.  You have tingling or numbness around the injured area.  You have severe pain or pressure under the cast.  You have any difficulty with your breathing or have shortness of breath.  You have chest pain. Document Released: 07/08/2000 Document Revised: 05/01/2013 Document Reviewed: 01/17/2013 [facility] Patient Information 2015 [place], [place]. This information is not intended to replace advice given to you by your health care provider. Make sure you discuss any questions you have  with your health care provider.  Boxer's Fracture You have a break (fracture) of the fifth metacarpal bone. This is commonly called a boxer's fracture. This is the bone in the hand where the little finger attaches. The fracture is in the end of that bone, closest to the little finger. It is usually caused when you hit an object with a clenched fist. Often, the knuckle is pushed down by the impact. Sometimes, the fracture rotates out of position. A boxer's fracture will usually heal within 6 weeks, if it is treated properly and protected from re-injury. Surgery is sometimes needed. A cast, splint, or bulky hand dressing may be used to protect and immobilize a boxer's fracture. Do not remove this device or dressing until your caregiver approves. Keep your hand elevated, and apply ice packs for 15-20 minutes every 2 hours, for the first 2 days. Elevation and ice help reduce swelling and relieve pain. See your caregiver, or an orthopedic specialist, for follow-up care within the next 10 days. This is to make sure your fracture is healing properly. Document Released: 07/11/2005 Document Revised: 10/03/2011 Document Reviewed: 12/29/2006 [facility] Patient Information 2015 [place], [place]. This information is not intended to replace advice given to you by your health care provider. Make sure you discuss any questions you have with your health care provider.

## 2015-01-08 ENCOUNTER — Other Ambulatory Visit: Payer: Self-pay | Admitting: Neurology

## 2015-01-08 NOTE — Telephone Encounter (Signed)
Patient is calling to get a refill for Rx Keppra 500 mg as she is out of pills.  Send Rx to [facility]. Thanks!

## 2015-01-08 NOTE — Telephone Encounter (Signed)
One refill sent.  I called the patient back.  Got no answer.  Left message advising 1 refill was sent, and asked that she call back to schedule annual appt.

## 2015-02-04 ENCOUNTER — Encounter: Payer: Self-pay | Admitting: *Deleted

## 2015-02-04 ENCOUNTER — Telehealth: Payer: Self-pay | Admitting: Neurology

## 2015-02-04 ENCOUNTER — Other Ambulatory Visit: Payer: Self-pay | Admitting: *Deleted

## 2015-02-04 MED ORDER — LEVETIRACETAM 500 MG PO TABS: ORAL_TABLET | ORAL | Status: DC

## 2015-02-04 NOTE — Telephone Encounter (Signed)
Letter provided - patient will pick up from our office.  She will make a payment towards her balance and schedule a follow up appointment.  Per Dr. Terrace ArabiaYan, ok to provide a one month refill of Keppra.  Rx sent to the pharmacy.

## 2015-02-04 NOTE — Telephone Encounter (Signed)
Pt called and needs a letter stating that she is epileptic and the length of time [facility].Please call and advise [phone].

## 2015-02-04 NOTE — Telephone Encounter (Signed)
Left message for a return call

## 2015-02-05 ENCOUNTER — Telehealth: Payer: Self-pay | Admitting: Neurology

## 2015-02-19 ENCOUNTER — Encounter: Payer: Self-pay | Admitting: Neurology

## 2015-02-19 ENCOUNTER — Ambulatory Visit (INDEPENDENT_AMBULATORY_CARE_PROVIDER_SITE_OTHER): Payer: Self-pay | Admitting: Neurology

## 2015-02-19 VITALS — HR 58 | Ht 64.0 in | Wt 229.0 lb

## 2015-02-19 DIAGNOSIS — G40209 Localization-related (focal) (partial) symptomatic epilepsy and epileptic syndromes with complex partial seizures, not intractable, without status epilepticus: Secondary | ICD-10-CM

## 2015-02-19 MED ORDER — LEVETIRACETAM 500 MG PO TABS: ORAL_TABLET | ORAL | Status: DC

## 2015-02-19 NOTE — Telephone Encounter (Signed)
Jessica: please help her get medicine assistant program for her keppra  1 tab in am and 2 tabs po qhs.

## 2015-02-19 NOTE — Telephone Encounter (Signed)
I called the patient to provide contact info for the Keppra Patient Assist Program, [phone].  Got no answer.  Left message relaying this info.  Asked that she please call us back with any questions.

## 2015-02-19 NOTE — Progress Notes (Signed)
PATIENT: Brittany Hendrix DOB: 27-Sep-1990  Chief Complaint  Patient presents with  . Seizures    She is here for her yearly follow up.  She has not had any seizures since her last increase of Keppra 500mg  to one tab in am and two tabs in pm.    HISTORICAL  Brittany Hendrix is a 24 years old female, with epilepsy disorder, currently taking Keppra 500 in the morning, 1000 mg in the evening. Last seizure was in May 2014.   She had seizures since she was 85-58 years old.  All seizure has similar seminology. She has an aura before those seizures, she feels drowsy and sometimes nauseated, lasting few minutes, followed by loss of conciousness, whole body shaking, then post ictal confusion usually last 20-30 minutes..   She has injured herself in the face and on her leg  during seizures in the past. She was never on any antiseizure medication, until after she had a recurrent seizure in May 2012, she was put on Levetiracetam 500 mg b.i.d.   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  her bloodwork tested positive for marijuana.   For a while, she was noncompliant with her medications, also concerned about the medication costs, she continue have recurrent seizures.   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 ibuprofen (ADVIL,MOTRIN) 800 MG tablet Take 1 tablet (800 mg total) by mouth every 8 (eight) hours as needed for mild pain or moderate pain. 15 tablet 0  . levETIRAcetam (KEPPRA) 500 MG tablet One tab in morning and two tabs at bedtime. 90 tablet 0   No current facility-administered medications for this visit.    PAST MEDICAL HISTORY: Past Medical History  Diagnosis Date  . Seizures     Last seizure 5 months ago  . Abortion history   . Headache(784.0)     PAST SURGICAL HISTORY: Past Surgical History  Procedure Laterality Date  . Dilation and curettage of uterus      abortion    FAMILY HISTORY: Family History  Problem Relation Age of Onset  . Asthma Brother   . Diabetes Mother     SOCIAL HISTORY:  History   Social History  . Marital Status: Single    Spouse Name: N/A  . Number of Children: 0  . Years of Education: 12   Occupational History  . CASHIER     [employer]   Social History Main Topics  . Smoking status: Current Every Day Smoker -- 0.50 packs/day for 5 years    Types: Cigarettes  . Smokeless tobacco: Never  Used  . Alcohol Use: Yes  . Drug Use: No  . Sexual Activity: Yes    Birth Control/ Protection: None   Other Topics Concern  . Not on file   Social History Narrative   Patient is single and lives at home alone. Patient works at [employer]. Right handed. Caffeine three daily.     PHYSICAL EXAM   Filed Vitals:   02/19/15 1043  Pulse: 58  Height: 5\' 4"  (1.626 m)  Weight: 229 lb (103.874 kg)    Not recorded      Body mass index is 39.29 kg/(m^2).  PHYSICAL EXAMNIATION:  Gen: NAD, conversant, well nourised, obese, well groomed                     Cardiovascular: Regular rate rhythm, no peripheral edema, warm, nontender. Eyes:  Conjunctivae clear without exudates or hemorrhage Neck: Supple, no carotid bruise. Pulmonary: Clear to auscultation bilaterally   NEUROLOGICAL EXAM:  MENTAL STATUS: Speech:    Speech is normal; fluent and spontaneous with normal comprehension.  Cognition:    The patient is oriented to person, place, and time;     recent and remote memory intact;     language fluent;     normal attention, concentration,     fund of knowledge.  CRANIAL NERVES: CN II: Visual fields are full to confrontation. Fundoscopic exam is normal with sharp discs and no vascular changes. Pupil equal round reactive to light CN III, IV, VI: extraocular movement are normal. No ptosis. CN V: Facial sensation is intact to pinprick in all 3 divisions bilaterally. Corneal responses are intact.  CN VII: Face is symmetric with normal eye closure and smile. CN VIII: Hearing is normal to rubbing fingers CN IX, X: Palate elevates symmetrically. Phonation is normal. CN XI: Head turning and shoulder shrug are intact CN XII: Tongue is midline with normal movements and no atrophy.  MOTOR: There is no pronator drift of out-stretched arms. Muscle bulk and tone are normal. Muscle strength is normal.  REFLEXES: Reflexes are 2+ and symmetric at the biceps, triceps, knees, and ankles. Plantar responses are flexor.  SENSORY: Light touch, pinprick, position sense, and vibration sense are intact in fingers and toes.  COORDINATION: Rapid alternating movements and fine finger movements are intact. There is no dysmetria on finger-to-nose and heel-knee-shin. There are no abnormal or extraneous movements.   GAIT/STANCE: Posture is normal. Gait is steady with normal steps, base, arm swing, and turning. Heel and toe walking are normal. Tandem gait is normal.  Romberg is absent.   DIAGNOSTIC DATA (LABS, IMAGING, TESTING) - I reviewed patient records, labs, notes, testing and imaging myself where available.  Lab Results  Component  Value Date   WBC 8.7 12/11/2013   HGB 12.3 12/11/2013   HCT 36.3 12/11/2013   MCV 91.4 12/11/2013   PLT 271 12/11/2013      Component Value Date/Time   NA 138 07/22/2013 1000   K 3.6 07/22/2013 1000   CL 104 07/22/2013 1000   CO2 25 07/22/2013 1000   GLUCOSE 96 07/22/2013 1000   BUN 12 07/22/2013 1000   CREATININE 0.68 07/22/2013 1000   CALCIUM 8.9 07/22/2013 1000   GFRNONAA >90 07/22/2013 1000   GFRAA >90 07/22/2013 1000     ASSESSMENT AND PLAN  Deeanne Deininger Budzik is a 24 y.o. 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   Levert Feinstein, M.D. Ph.D.  [facility] [address] [place], [place] [address] Ph: [phone] Fax: [phone]

## 2015-03-12 NOTE — Telephone Encounter (Signed)
Patient called stating she called Keppra Program and was told form could be downloaded from website and sent from our office. She doesn't know how to download. Could our office possibly help her. She can be reached at [phone]

## 2015-03-12 NOTE — Telephone Encounter (Signed)
We can certainly download and print the form for her.  I called back, got no answer.  Left message.  Asked that she please call us back and advise if she prefers that we leave the blank form at the front desk, or if she'd like it mailed to her.

## 2015-04-19 ENCOUNTER — Inpatient Hospital Stay (HOSPITAL_COMMUNITY)
Admission: AD | Admit: 2015-04-19 | Discharge: 2015-04-20 | Disposition: A | Discharge status: Home / Self Care | Payer: Medicaid Other | Source: Ambulatory Visit | Attending: Obstetrics & Gynecology | Admitting: Obstetrics & Gynecology

## 2015-04-19 DIAGNOSIS — F17210 Nicotine dependence, cigarettes, uncomplicated: Secondary | ICD-10-CM | POA: Diagnosis not present

## 2015-04-19 DIAGNOSIS — Z3A01 Less than 8 weeks gestation of pregnancy: Secondary | ICD-10-CM | POA: Diagnosis not present

## 2015-04-19 DIAGNOSIS — N898 Other specified noninflammatory disorders of vagina: Secondary | ICD-10-CM | POA: Diagnosis not present

## 2015-04-19 DIAGNOSIS — O26891 Other specified pregnancy related conditions, first trimester: Secondary | ICD-10-CM | POA: Diagnosis not present

## 2015-04-19 DIAGNOSIS — O3680X0 Pregnancy with inconclusive fetal viability, not applicable or unspecified: Secondary | ICD-10-CM

## 2015-04-19 DIAGNOSIS — R109 Unspecified abdominal pain: Secondary | ICD-10-CM | POA: Diagnosis present

## 2015-04-19 DIAGNOSIS — O26899 Other specified pregnancy related conditions, unspecified trimester: Secondary | ICD-10-CM

## 2015-04-19 LAB — POCT PREGNANCY, URINE: Preg Test, Ur: POSITIVE — AB

## 2015-04-20 ENCOUNTER — Inpatient Hospital Stay (HOSPITAL_COMMUNITY): Payer: Medicaid Other

## 2015-04-20 ENCOUNTER — Encounter (HOSPITAL_COMMUNITY): Payer: Self-pay

## 2015-04-20 DIAGNOSIS — O9989 Other specified diseases and conditions complicating pregnancy, childbirth and the puerperium: Secondary | ICD-10-CM

## 2015-04-20 DIAGNOSIS — R109 Unspecified abdominal pain: Secondary | ICD-10-CM

## 2015-04-20 LAB — CBC
HCT: 35.2 % — ABNORMAL LOW (ref 36.0–46.0)
Hemoglobin: 11.9 g/dL — ABNORMAL LOW (ref 12.0–15.0)
MCH: 31 pg (ref 26.0–34.0)
MCHC: 33.8 g/dL (ref 30.0–36.0)
MCV: 91.7 fL (ref 78.0–100.0)
PLATELETS: 263 10*3/uL (ref 150–400)
RBC: 3.84 MIL/uL — ABNORMAL LOW (ref 3.87–5.11)
RDW: 12.7 % (ref 11.5–15.5)
WBC: 13.1 10*3/uL — AB (ref 4.0–10.5)

## 2015-04-20 LAB — URINALYSIS, ROUTINE W REFLEX MICROSCOPIC
BILIRUBIN URINE: NEGATIVE
Glucose, UA: NEGATIVE mg/dL
Ketones, ur: NEGATIVE mg/dL
Leukocytes, UA: NEGATIVE
NITRITE: NEGATIVE
Protein, ur: NEGATIVE mg/dL
SPECIFIC GRAVITY, URINE: 1.025 (ref 1.005–1.030)
UROBILINOGEN UA: 0.2 mg/dL (ref 0.0–1.0)
pH: 6 (ref 5.0–8.0)

## 2015-04-20 LAB — WET PREP, GENITAL
TRICH WET PREP: NONE SEEN
YEAST WET PREP: NONE SEEN

## 2015-04-20 LAB — GC/CHLAMYDIA PROBE AMP (~~LOC~~) NOT AT ARMC
Chlamydia: POSITIVE — AB
NEISSERIA GONORRHEA: NEGATIVE

## 2015-04-20 LAB — URINE MICROSCOPIC-ADD ON

## 2015-04-20 LAB — HIV ANTIBODY (ROUTINE TESTING W REFLEX): HIV Screen 4th Generation wRfx: NONREACTIVE

## 2015-04-20 LAB — HCG, QUANTITATIVE, PREGNANCY: hCG, Beta Chain, Quant, S: 608 m[IU]/mL — ABNORMAL HIGH (ref <5)

## 2015-04-20 NOTE — Discharge Instructions (Signed)

## 2015-04-20 NOTE — MAU Provider Note (Signed)
Chief Complaint: No chief complaint on file.   First Provider Initiated Contact with Patient 04/20/15 0119      SUBJECTIVE HPI: Brittany Hendrix is a 24 y.o. [id number] at [redacted]w[redacted]d by LMP who presents to maternity admissions reporting abdominal cramping in pregnancy. She initially presented to MAU requesting pregnancy confirmation only but does report mild abdominal cramping intermittently x 2-3 days.  Patient's last menstrual period was 03/15/2015.  She is fairly sure of this date and reports regular periods.  She also reports vaginal discharge with odor, reporting she has frequent bacterial vaginosis related to her antiseizure medications. She denies vaginal bleeding, vaginal itching/burning, urinary symptoms, h/a, dizziness, n/v, or fever/chills.     Abdominal Pain This is a new problem. The current episode started in the past 7 days. The onset quality is gradual. The problem occurs intermittently. The problem has been waxing and waning. The pain is located in the LLQ and RLQ. The pain is mild. The quality of the pain is cramping. The abdominal pain does not radiate. Pertinent negatives include no constipation, diarrhea, dysuria, fever, frequency, headaches, nausea or vomiting. Nothing aggravates the pain. The pain is relieved by nothing. She has tried nothing for the symptoms.    Past Medical History  Diagnosis Date  . Seizures     Last seizure 5 months ago  . Abortion history   . [id number](914.7)    Past Surgical History  Procedure Laterality Date  . Dilation and curettage of uterus      abortion   Social History   Social History  . Marital Status: Single    Spouse Name: N/A  . Number of Children: 0  . Years of Education: 12   Occupational History  . CASHIER     [employer]   Social History Main Topics  . Smoking status: Current Every Day Smoker -- 0.50 packs/day for 5 years    Types: Cigarettes  . Smokeless tobacco: Never Used  . Alcohol Use: Yes     Comment: occasional  .  Drug Use: Yes    Special: Marijuana  . Sexual Activity: Yes    Birth Control/ Protection: None   Other Topics Concern  . Not on file   Social History Narrative   Patient is single and lives at home alone. Patient works at [employer]. Right handed. Caffeine three daily.   No current facility-administered medications on file prior to encounter.   Current Outpatient Prescriptions on File Prior to Encounter  Medication Sig Dispense Refill  . levETIRAcetam (KEPPRA) 500 MG tablet One tab in morning and two tabs at bedtime. 90 tablet 11   No Known Allergies  ROS:  Review of Systems  Constitutional: Negative for fever, chills and fatigue.  HENT: Negative for sinus pressure.   Eyes: Negative for photophobia.  Respiratory: Negative for shortness of breath.   Cardiovascular: Negative for chest pain.  Gastrointestinal: Positive for abdominal pain. Negative for nausea, vomiting, diarrhea and constipation.  Genitourinary: Negative for dysuria, frequency, flank pain, vaginal bleeding, vaginal discharge, difficulty urinating, vaginal pain and pelvic pain.  Musculoskeletal: Negative for neck pain.  Neurological: Negative for dizziness, weakness and headaches.  Psychiatric/Behavioral: Negative.      I have reviewed patient's Past Medical Hx, Surgical Hx, Family Hx, Social Hx, medications and allergies.   Physical Exam   Patient Vitals for the past 24 hrs:  BP Temp Temp src Pulse Resp Weight  04/20/15 0245 132/71 mmHg - - 81 18 -  04/20/15 0008 141/84 mmHg - - - - -  04/19/15 2355 151/83 mmHg 99 F (37.2 C) Oral 97 18 103.023 kg (227 lb 2 oz)   Constitutional: Well-developed, well-nourished female in no acute distress.  Cardiovascular: normal rate Respiratory: normal effort GI: Abd soft, non-tender. Pos BS x 4 MS: Extremities nontender, no edema, normal ROM Neurologic: Alert and oriented x 4.  GU: Neg CVAT.  PELVIC EXAM: Cervix pink, visually closed, without lesion, moderate amount  thin white discharge, vaginal walls and external genitalia normal Bimanual exam: Cervix 0/long/high, firm, anterior, neg CMT, uterus nontender, nonenlarged, adnexa without tenderness, enlargement, or mass   LAB RESULTS Results for orders placed or performed during the hospital encounter of 04/19/15 (from the past 24 hour(s))  Urinalysis, Routine w reflex microscopic (not at [facility])     Status: Abnormal   Collection Time: 04/19/15 11:45 PM  Result Value Ref Range   Color, Urine YELLOW YELLOW   APPearance CLEAR CLEAR   Specific Gravity, Urine 1.025 1.005 - 1.030   pH 6.0 5.0 - 8.0   Glucose, UA NEGATIVE NEGATIVE mg/dL   Hgb urine dipstick TRACE (A) NEGATIVE   Bilirubin Urine NEGATIVE NEGATIVE   Ketones, ur NEGATIVE NEGATIVE mg/dL   Protein, ur NEGATIVE NEGATIVE mg/dL   Urobilinogen, UA 0.2 0.0 - 1.0 mg/dL   Nitrite NEGATIVE NEGATIVE   Leukocytes, UA NEGATIVE NEGATIVE  Urine microscopic-add on     Status: None   Collection Time: 04/19/15 11:45 PM  Result Value Ref Range   Squamous Epithelial / LPF RARE RARE   WBC, UA 0-2 <3 WBC/hpf   RBC / HPF 0-2 <3 RBC/hpf   Bacteria, UA RARE RARE  Pregnancy, urine POC     Status: Abnormal   Collection Time: 04/19/15 11:54 PM  Result Value Ref Range   Preg Test, Ur POSITIVE (A) NEGATIVE  CBC     Status: Abnormal   Collection Time: 04/20/15 12:20 AM  Result Value Ref Range   WBC 13.1 (H) 4.0 - 10.5 K/uL   RBC 3.84 (L) 3.87 - 5.11 MIL/uL   Hemoglobin 11.9 (L) 12.0 - 15.0 g/dL   HCT 16.1 (L) 09.6 - 04.5 %   MCV 91.7 78.0 - 100.0 fL   MCH 31.0 26.0 - 34.0 pg   MCHC 33.8 30.0 - 36.0 g/dL   RDW 40.9 81.1 - 91.4 %   Platelets 263 150 - 400 K/uL  hCG, quantitative, pregnancy     Status: Abnormal   Collection Time: 04/20/15 12:20 AM  Result Value Ref Range   hCG, Beta Chain, Quant, S 608 (H) <5 mIU/mL  Wet prep, genital     Status: Abnormal   Collection Time: 04/20/15  1:17 AM  Result Value Ref Range   Yeast Wet Prep HPF POC NONE SEEN NONE SEEN    Trich, Wet Prep NONE SEEN NONE SEEN   Clue Cells Wet Prep HPF POC FEW (A) NONE SEEN   WBC, Wet Prep HPF POC FEW (A) NONE SEEN       IMAGING US Ob Comp Less 14 Wks  04/20/2015   CLINICAL DATA:  Pregnant patient with abdominal cramping today.  EXAM: OBSTETRIC <14 WK [place] AND TRANSVAGINAL OB US  TECHNIQUE: Both transabdominal and transvaginal ultrasound examinations were performed for complete evaluation of the gestation as well as the maternal uterus, adnexal regions, and pelvic cul-de-sac. Transvaginal technique was performed to assess early pregnancy.  COMPARISON:  None.  FINDINGS: Intrauterine gestational sac: Visualized.  Yolk sac:  Not present.  Embryo:  Not present.  Cardiac Activity: Not  present.  MSD: 3.1  mm   5 w   0  d  Maternal uterus/adnexae: No subchorionic hemorrhage. The right ovary appears normal. The left ovary is not definitively seen. Trace pelvic free fluid.  IMPRESSION: Probable early intrauterine gestational sac, but no yolk sac, fetal pole, or cardiac activity yet visualized. Recommend follow-up quantitative B-HCG levels and follow-up US in 14 days to confirm and assess viability. This recommendation follows SRU consensus guidelines: Diagnostic Criteria for Nonviable Pregnancy Early in the First Trimester. Malva Limes Med 2013; 756:4332-95.   Electronically Signed   By: Rubye Oaks M.D.   On: 04/20/2015 01:48   US Ob Transvaginal  04/20/2015   CLINICAL DATA:  Pregnant patient with abdominal cramping today.  EXAM: OBSTETRIC <14 WK [place] AND TRANSVAGINAL OB US  TECHNIQUE: Both transabdominal and transvaginal ultrasound examinations were performed for complete evaluation of the gestation as well as the maternal uterus, adnexal regions, and pelvic cul-de-sac. Transvaginal technique was performed to assess early pregnancy.  COMPARISON:  None.  FINDINGS: Intrauterine gestational sac: Visualized.  Yolk sac:  Not present.  Embryo:  Not present.  Cardiac Activity: Not present.  MSD: 3.1  mm    5 w   0  d  Maternal uterus/adnexae: No subchorionic hemorrhage. The right ovary appears normal. The left ovary is not definitively seen. Trace pelvic free fluid.  IMPRESSION: Probable early intrauterine gestational sac, but no yolk sac, fetal pole, or cardiac activity yet visualized. Recommend follow-up quantitative B-HCG levels and follow-up US in 14 days to confirm and assess viability. This recommendation follows SRU consensus guidelines: Diagnostic Criteria for Nonviable Pregnancy Early in the First Trimester. Malva Limes Med 2013; 188:4166-06.   Electronically Signed   By: Rubye Oaks M.D.   On: 04/20/2015 01:48    MAU Management/MDM: Ordered labs and imaging and reviewed results.  Findings today could represent a normal early pregnancy, spontaneous abortion or ectopic pregnancy which can be life-threatening.  Ectopic precautions were given to the patient with plan to return in 48 hours for repeat quant hcg to evaluate pregnancy development.  Pt stable at time of discharge.  ASSESSMENT 1. Pregnancy of unknown anatomic location   2. Abdominal pain affecting pregnancy     PLAN Discharge home with ectopic precautions Return in 48 hours for repeat quant hcg or sooner as needed for emergencies    Medication List    STOP taking these medications        ibuprofen 800 MG tablet  Commonly known as:  ADVIL,MOTRIN      TAKE these medications        levETIRAcetam 500 MG tablet  Commonly known as:  KEPPRA  One tab in morning and two tabs at bedtime.       Follow-up Information    Follow up with [facility] [place] MATERN[facility] ADMISS[facility].   Why:  In 48 hours for repeat labs or sooner as needed   Contact information:   [id number] [address]c [place] [address] [phone]      Sharen Counter Certified Nurse-Midwife 04/20/2015  2:55 AM

## 2015-04-20 NOTE — MAU Note (Signed)
Pt had +UPT at home. Pt just wants confirmation. LMP: 03/15/2015.  Denies vag bleeding but having some mild cramping that she rates 2/10.

## 2015-04-22 ENCOUNTER — Inpatient Hospital Stay (HOSPITAL_COMMUNITY)
Admission: AD | Admit: 2015-04-22 | Discharge: 2015-04-22 | Disposition: A | Discharge status: Home / Self Care | Payer: Medicaid Other | Source: Ambulatory Visit | Attending: Obstetrics and Gynecology | Admitting: Obstetrics and Gynecology

## 2015-04-22 DIAGNOSIS — R109 Unspecified abdominal pain: Secondary | ICD-10-CM | POA: Diagnosis present

## 2015-04-22 DIAGNOSIS — O26899 Other specified pregnancy related conditions, unspecified trimester: Secondary | ICD-10-CM

## 2015-04-22 DIAGNOSIS — O26891 Other specified pregnancy related conditions, first trimester: Secondary | ICD-10-CM | POA: Insufficient documentation

## 2015-04-22 DIAGNOSIS — O3680X0 Pregnancy with inconclusive fetal viability, not applicable or unspecified: Secondary | ICD-10-CM

## 2015-04-22 DIAGNOSIS — O9989 Other specified diseases and conditions complicating pregnancy, childbirth and the puerperium: Secondary | ICD-10-CM

## 2015-04-22 DIAGNOSIS — Z3A01 Less than 8 weeks gestation of pregnancy: Secondary | ICD-10-CM | POA: Insufficient documentation

## 2015-04-22 LAB — HCG, QUANTITATIVE, PREGNANCY: HCG, BETA CHAIN, QUANT, S: 1573 m[IU]/mL — AB (ref <5)

## 2015-04-22 NOTE — MAU Note (Signed)
Pt returns for repeat BHCG. Denies bleeding , states she continues to have mild cramping off/on.

## 2015-04-22 NOTE — MAU Provider Note (Signed)
[name]  is a 24 y.o. [id number] at [redacted]w[redacted]d who presents to MAU today for follow-up quant hCG after 48 hours. The patient states continued mild intermittent abdominal cramping. She denies pain now. She denies vaginal bleeding, N/V or fever.   BP 122/70 mmHg  Pulse 94  Resp 16  SpO2 100%  LMP 03/15/2015  CONSTITUTIONAL: Well-developed, well-nourished female in no acute distress.  ENT: External right and left ear normal.  EYES: EOM intact, conjunctivae normal.  MUSCULOSKELETAL: Normal range of motion.  CARDIOVASCULAR: Regular heart rate RESPIRATORY: Normal effort NEUROLOGICAL: Alert and oriented to person, place, and time.  SKIN: Skin is warm and dry. No rash noted. Not diaphoretic. No erythema. No pallor. PSYCH: Normal mood and affect. Normal behavior. Normal judgment and thought content.  Results for GLENDON, FISER (MRN [id number]) as of 04/22/2015 06:02  Ref. Range 04/20/2015 00:20 04/20/2015 01:17 04/20/2015 01:43 04/22/2015 04:59  HCG, Beta Chain, Quant, S Latest Ref Range: <5 mIU/mL 608 (H)   1573 (H)   A: Appropriate rise in quant hCG after 48 hours  P: Discharge home First trimester/ectopic precautions discussed Patient will return for follow-up US in 1 week. Order placed. They will call the patient with an appointment time Patient may return to MAU as needed or if her condition were to change or worsen   Marny Lowenstein, PA-C 04/22/2015 6:18 AM

## 2015-04-29 ENCOUNTER — Encounter (HOSPITAL_COMMUNITY): Payer: Self-pay | Admitting: Student

## 2015-04-29 ENCOUNTER — Ambulatory Visit (HOSPITAL_COMMUNITY)
Admission: RE | Admit: 2015-04-29 | Discharge: 2015-04-29 | Disposition: A | Discharge status: Home / Self Care | Payer: Medicaid Other | Source: Ambulatory Visit | Attending: Medical | Admitting: Medical

## 2015-04-29 ENCOUNTER — Inpatient Hospital Stay (HOSPITAL_COMMUNITY)
Admission: AD | Admit: 2015-04-29 | Discharge: 2015-04-29 | Disposition: A | Discharge status: Home / Self Care | Payer: Self-pay | Source: Ambulatory Visit | Attending: Obstetrics and Gynecology | Admitting: Obstetrics and Gynecology

## 2015-04-29 DIAGNOSIS — R109 Unspecified abdominal pain: Secondary | ICD-10-CM

## 2015-04-29 DIAGNOSIS — O3680X0 Pregnancy with inconclusive fetal viability, not applicable or unspecified: Secondary | ICD-10-CM

## 2015-04-29 DIAGNOSIS — Z36 Encounter for antenatal screening of mother: Secondary | ICD-10-CM | POA: Insufficient documentation

## 2015-04-29 DIAGNOSIS — O26899 Other specified pregnancy related conditions, unspecified trimester: Secondary | ICD-10-CM

## 2015-04-29 DIAGNOSIS — O9989 Other specified diseases and conditions complicating pregnancy, childbirth and the puerperium: Secondary | ICD-10-CM

## 2015-04-29 DIAGNOSIS — Z331 Pregnant state, incidental: Secondary | ICD-10-CM

## 2015-04-29 DIAGNOSIS — Z3A01 Less than 8 weeks gestation of pregnancy: Secondary | ICD-10-CM | POA: Diagnosis not present

## 2015-04-29 DIAGNOSIS — Z3490 Encounter for supervision of normal pregnancy, unspecified, unspecified trimester: Secondary | ICD-10-CM

## 2015-04-29 NOTE — Discharge Instructions (Signed)
First Trimester of Pregnancy  The first trimester of pregnancy is from week 1 until the end of week 12 (months 1 through 3). A week after a sperm fertilizes an egg, the egg will implant on the wall of the uterus. This embryo will begin to develop into a baby. Genes from you and your partner are forming the baby. The female genes determine whether the baby is a boy or a girl. At 6-8 weeks, the eyes and face are formed, and the heartbeat can be seen on ultrasound. At the end of 12 weeks, all the baby's organs are formed.   Now that you are pregnant, you will want to do everything you can to have a healthy baby. Two of the most important things are to get good prenatal care and to follow your health care provider's instructions. Prenatal care is all the medical care you receive before the baby's birth. This care will help prevent, find, and treat any problems during the pregnancy and childbirth.  BODY CHANGES  Your body goes through many changes during pregnancy. The changes vary from woman to woman.   · You may gain or lose a couple of pounds at first.  · You may feel sick to your stomach (nauseous) and throw up (vomit). If the vomiting is uncontrollable, call your health care provider.  · You may tire easily.  · You may develop headaches that can be relieved by medicines approved by your health care provider.  · You may urinate more often. Painful urination may mean you have a bladder infection.  · You may develop heartburn as a result of your pregnancy.  · You may develop constipation because certain hormones are causing the muscles that push waste through your intestines to slow down.  · You may develop hemorrhoids or swollen, bulging veins (varicose veins).  · Your breasts may begin to grow larger and become tender. Your nipples may stick out more, and the tissue that surrounds them (areola) may become darker.  · Your gums may bleed and may be sensitive to brushing and flossing.   · Dark spots or blotches (chloasma, mask of pregnancy) may develop on your face. This will likely fade after the baby is born.  · Your menstrual periods will stop.  · You may have a loss of appetite.  · You may develop cravings for certain kinds of food.  · You may have changes in your emotions from day to day, such as being excited to be pregnant or being concerned that something may go wrong with the pregnancy and baby.  · You may have more vivid and strange dreams.  · You may have changes in your hair. These can include thickening of your hair, rapid growth, and changes in texture. Some women also have hair loss during or after pregnancy, or hair that feels dry or thin. Your hair will most likely return to normal after your baby is born.  WHAT TO EXPECT AT YOUR PRENATAL VISITS  During a routine prenatal visit:  · You will be weighed to make sure you and the baby are growing normally.  · Your blood pressure will be taken.  · Your abdomen will be measured to track your baby's growth.  · The fetal heartbeat will be listened to starting around week 10 or 12 of your pregnancy.  · Test results from any previous visits will be discussed.  Your health care provider may ask you:  · How you are feeling.  · If you   including cigarettes, chewing tobacco, and electronic cigarettes. °· If you have any questions. °Other tests that may be performed during your first trimester include: °· Blood tests to find your blood type and to check for the presence of any previous infections. They will also be used to check for low iron levels (anemia) and Rh antibodies. Later in the pregnancy, blood tests for diabetes will be done along with other tests if problems develop. °· Urine tests to check for infections, diabetes, or protein in the urine. °· An ultrasound to confirm the proper growth  and development of the baby. °· An amniocentesis to check for possible genetic problems. °· Fetal screens for spina bifida and Down syndrome. °· You may need other tests to make sure you and the baby are doing well. °· HIV (human immunodeficiency virus) testing. Routine prenatal testing includes screening for HIV, unless you choose not to have this test. °HOME CARE INSTRUCTIONS  °Medicines °· Follow your health care provider's instructions regarding medicine use. Specific medicines may be either safe or unsafe to take during pregnancy. °· Take your prenatal vitamins as directed. °· If you develop constipation, try taking a stool softener if your health care provider approves. °Diet °· Eat regular, well-balanced meals. Choose a variety of foods, such as meat or vegetable-based protein, fish, milk and low-fat dairy products, vegetables, fruits, and whole grain breads and cereals. Your health care provider will help you determine the amount of weight gain that is right for you. °· Avoid raw meat and uncooked cheese. These carry germs that can cause birth defects in the baby. °· Eating four or five small meals rather than three large meals a day may help relieve nausea and vomiting. If you start to feel nauseous, eating a few soda crackers can be helpful. Drinking liquids between meals instead of during meals also seems to help nausea and vomiting. °· If you develop constipation, eat more high-fiber foods, such as fresh vegetables or fruit and whole grains. Drink enough fluids to keep your urine clear or pale yellow. °Activity and Exercise °· Exercise only as directed by your health care provider. Exercising will help you: °¨ Control your weight. °¨ Stay in shape. °¨ Be prepared for labor and delivery. °· Experiencing pain or cramping in the lower abdomen or low back is a good sign that you should stop exercising. Check with your health care provider before continuing normal exercises. °· Try to avoid standing for long  periods of time. Move your legs often if you must stand in one place for a long time. °· Avoid heavy lifting. °· Wear low-heeled shoes, and practice good posture. °· You may continue to have sex unless your health care provider directs you otherwise. °Relief of Pain or Discomfort °· Wear a good support bra for breast tenderness.   °· Take warm sitz baths to soothe any pain or discomfort caused by hemorrhoids. Use hemorrhoid cream if your health care provider approves.   °· Rest with your legs elevated if you have leg cramps or low back pain. °· If you develop varicose veins in your legs, wear support hose. Elevate your feet for 15 minutes, 3-4 times a day. Limit salt in your diet. °Prenatal Care °· Schedule your prenatal visits by the twelfth week of pregnancy. They are usually scheduled monthly at first, then more often in the last 2 months before delivery. °· Write down your questions. Take them to your prenatal visits. °· Keep all your prenatal visits as directed by your   health care provider. Safety  Wear your seat belt at all times when driving.  Make a list of emergency phone numbers, including numbers for family, friends, the hospital, and police and fire departments. General Tips  Ask your health care provider for a referral to a local prenatal education class. Begin classes no later than at the beginning of month 6 of your pregnancy.  Ask for help if you have counseling or nutritional needs during pregnancy. Your health care provider can offer advice or refer you to specialists for help with various needs.  Do not use hot tubs, steam rooms, or saunas.  Do not douche or use tampons or scented sanitary pads.  Do not cross your legs for long periods of time.  Avoid cat litter boxes and soil used by cats. These carry germs that can cause birth defects in the baby and possibly loss of the fetus by miscarriage or stillbirth.  Avoid all smoking, herbs, alcohol, and medicines not prescribed by  your health care provider. Chemicals in these affect the formation and growth of the baby.  Do not use any tobacco products, including cigarettes, chewing tobacco, and electronic cigarettes. If you need help quitting, ask your health care provider. You may receive counseling support and other resources to help you quit.  Schedule a dentist appointment. At home, brush your teeth with a soft toothbrush and be gentle when you floss. SEEK MEDICAL CARE IF:   You have dizziness.  You have mild pelvic cramps, pelvic pressure, or nagging pain in the abdominal area.  You have persistent nausea, vomiting, or diarrhea.  You have a bad smelling vaginal discharge.  You have pain with urination.  You notice increased swelling in your face, hands, legs, or ankles. SEEK IMMEDIATE MEDICAL CARE IF:   You have a fever.  You are leaking fluid from your vagina.  You have spotting or bleeding from your vagina.  You have severe abdominal cramping or pain.  You have rapid weight gain or loss.  You vomit blood or material that looks like coffee grounds.  You are exposed to [place] measles and have never had them.  You are exposed to fifth disease or chickenpox.  You develop a severe headache.  You have shortness of breath.  You have any kind of trauma, such as from a fall or a car accident.   This information is not intended to replace advice given to you by your health care provider. Make sure you discuss any questions you have with your health care provider.   Document Released: 07/05/2001 Document Revised: 08/01/2014 Document Reviewed: 05/21/2013 Elsevier Interactive Patient Education 2016 [place] Medications in Pregnancy   Acne: Benzoyl Peroxide Salicylic Acid  Backache/Headache: Tylenol: 2 regular strength every 4 hours OR              2 Extra strength every 6 hours  Colds/Coughs/Allergies: Benadryl (alcohol free) 25 mg every 6 hours as needed Breath right  strips Claritin Cepacol throat lozenges Chloraseptic throat spray Cold-Eeze- up to three times per day Cough drops, alcohol free Flonase (by prescription only) Guaifenesin Mucinex Robitussin DM (plain only, alcohol free) Saline nasal spray/drops Sudafed (pseudoephedrine) & Actifed ** use only after [redacted] weeks gestation and if you do not have high blood pressure Tylenol Vicks Vaporub Zinc lozenges Zyrtec   Constipation: Colace Ducolax suppositories Fleet enema Glycerin suppositories Metamucil Milk of magnesia Miralax Senokot Smooth move tea  Diarrhea: Kaopectate Imodium A-D  *NO pepto Bismol  Hemorrhoids: Anusol Anusol  Anusol °Anusol HC °Preparation H °Tucks ° °Indigestion: °Tums °Maalox °Mylanta °Zantac  °Pepcid ° °Insomnia: °Benadryl (alcohol free) 25mg every 6 hours as needed °Tylenol PM °Unisom, no Gelcaps ° °Leg Cramps: °Tums °MagGel ° °Nausea/Vomiting:  °Bonine °Dramamine °Emetrol °Ginger extract °Sea bands °Meclizine  °Nausea medication to take during pregnancy:  °Unisom (doxylamine succinate 25 mg tablets) Take one tablet daily at bedtime. If symptoms are not adequately controlled, the dose can be increased to a maximum recommended dose of two tablets daily (1/2 tablet in the morning, 1/2 tablet mid-afternoon and one at bedtime). °Vitamin B6 100mg tablets. Take one tablet twice a day (up to 200 mg per day). ° °Skin Rashes: °Aveeno products °Benadryl cream or 25mg every 6 hours as needed °Calamine Lotion °1% cortisone cream ° °Yeast infection: °Gyne-lotrimin 7 °Monistat 7 ° ° °**If taking multiple medications, please check labels to avoid duplicating the same active ingredients °**take medication as directed on the label °** Do not exceed 4000 mg of tylenol in 24 hours °**Do not take medications that contain aspirin or ibuprofen ° ° ° ° ° °[facility]  ° °[facility]      Phone: [phone] ° °[facility]     Phone: [phone] ° °[facility] [place]  Phone: [phone] ° °[facility] [place]  Phone: [phone] ° °[facility]    Phone: [phone]  ° °[facility]    Phone: [phone] ° °[facility]    Phone: [phone] ° °[facility]    Phone: [phone] ° °[facility]    Phone: [phone] ° °[facility] Phone: [phone]  ° °[facility]  Phone: [phone] ° °[facility]    Phone: [phone] ° °[facility]   Phone: [phone] ° °[facility]      Phone: [phone] ° °[facility]    Phone: [phone] ° °[facility]     Phone: [phone] ° °

## 2015-04-29 NOTE — MAU Provider Note (Signed)
History   [id number]   Chief Complaint  Patient presents with  . Follow-up    HPI Brittany Hendrix is a 24 y.o. female  G5P0040 here with for follow-up ultrasound.  Upon review of the records patient was first seen on 9/26 for pregnancy verification & abdominal cramping.   BHCG on that day was 608.  Ultrasound showed probable IUGS, but no yolk sac. Pt discharged home for f/u BHCG. Here today with no report of abdominal pain or vaginal bleeding.   All other systems wnl.  Last seen in MAU on 9/28.  BHCG was 1573.     Patient's last menstrual period was 03/15/2015.  OB History  Gravida Para Term Preterm AB SAB TAB Ectopic Multiple Living     # Outcome Date GA Lbr Len/2nd Weight Sex Delivery Anes PTL Lv  5 Current           4 SAB              Comments: System Generated. Please review and update pregnancy details.  3 SAB           2 SAB           1 TAB               Past Medical History  Diagnosis Date  . Seizures (HCC)     Last seizure 5 months ago  . Abortion history   . Headache(784.0)     Family History  Problem Relation Age of Onset  . Asthma Brother   . Diabetes Mother     Social History   Social History  . Marital Status: Single    Spouse Name: N/A  . Number of Children: 0  . Years of Education: 12   Occupational History  . CASHIER     [employer]   Social History Main Topics  . Smoking status: Current Every Day Smoker -- 0.50 packs/day for 5 years    Types: Cigarettes  . Smokeless tobacco: Never Used  . Alcohol Use: Yes     Comment: occasional  . Drug Use: Yes    Special: Marijuana  . Sexual Activity: Yes    Birth Control/ Protection: None   Other Topics Concern  . Not on file   Social History Narrative   Patient is single and lives at home alone. Patient works at [employer]. Right handed. Caffeine three daily.    No Known Allergies  No current facility-administered medications on file prior to encounter.    Current Outpatient Prescriptions on File Prior to Encounter  Medication Sig Dispense Refill  . levETIRAcetam (KEPPRA) 500 MG tablet One tab in morning and two tabs at bedtime. 90 tablet 11     Physical Exam   There were no vitals filed for this visit.  Physical Exam  MAU Course  Procedures US Ob Transvaginal  04/29/2015   CLINICAL DATA:  Assess viability ; first trimester of pregnancy.  EXAM: TRANSVAGINAL OB ULTRASOUND  TECHNIQUE: Transvaginal ultrasound was performed for complete evaluation of the gestation as well as the maternal uterus, adnexal regions, and pelvic cul-de-sac.  COMPARISON:  Ultrasound of April 20, 2015.  FINDINGS: Intrauterine gestational sac: Visualized/normal in shape.  Yolk sac:  Visualized.  Embryo:  Visualized.  Cardiac Activity: Visualized.  Heart Rate: 106 bpm  CRL:   2.8  mm   5 w 6 d  [place] EDC: December 24, 2015.  Maternal uterus/adnexae: Ovaries appear normal. Trace free fluid is noted.  IMPRESSION: Single live intrauterine gestation of 5 weeks 6 days.   Electronically Signed   By: Lupita Raider, M.D.   On: 04/29/2015 14:34    MDM Realized after patient was discharged that she had a positive chlamydia on 9/26. Marni unable to get in touch with her. Attempted to contact patient; left voice mail. Per Marni's note, report sent to [facility].   Assessment and Plan  24 y.o. G5P0040 at [redacted]w[redacted]d IUP  1. Abdominal pain affecting pregnancy   2. IUP (intrauterine pregnancy), incidental    P: Discharge home Start prenatal care Take prenatal vitamins Discussed purpose of MAU  Judeth Horn, NP 04/29/2015 2:41 PM

## 2015-05-09 ENCOUNTER — Inpatient Hospital Stay (HOSPITAL_COMMUNITY)
Admission: AD | Admit: 2015-05-09 | Discharge: 2015-05-09 | Discharge status: Against Medical Advice | Payer: Medicaid Other | Source: Ambulatory Visit | Attending: Obstetrics & Gynecology | Admitting: Obstetrics & Gynecology

## 2015-05-09 DIAGNOSIS — Z3491 Encounter for supervision of normal pregnancy, unspecified, first trimester: Secondary | ICD-10-CM | POA: Insufficient documentation

## 2015-05-09 LAB — URINALYSIS, ROUTINE W REFLEX MICROSCOPIC
BILIRUBIN URINE: NEGATIVE
GLUCOSE, UA: NEGATIVE mg/dL
HGB URINE DIPSTICK: NEGATIVE
KETONES UR: NEGATIVE mg/dL
Leukocytes, UA: NEGATIVE
Nitrite: NEGATIVE
PH: 7 (ref 5.0–8.0)
Protein, ur: NEGATIVE mg/dL
SPECIFIC GRAVITY, URINE: 1.01 (ref 1.005–1.030)
Urobilinogen, UA: 0.2 mg/dL (ref 0.0–1.0)

## 2015-05-09 NOTE — MAU Note (Signed)
Pt presents to MAU with complaints of nausea, vomiting and loose bowel movements for a couple of weeks. Denies any vaginal bleeding

## 2015-05-15 ENCOUNTER — Inpatient Hospital Stay (HOSPITAL_COMMUNITY): Payer: Medicaid Other

## 2015-05-15 ENCOUNTER — Encounter (HOSPITAL_COMMUNITY): Payer: Self-pay | Admitting: *Deleted

## 2015-05-15 ENCOUNTER — Inpatient Hospital Stay (HOSPITAL_COMMUNITY)
Admission: AD | Admit: 2015-05-15 | Discharge: 2015-05-15 | Disposition: A | Discharge status: Home / Self Care | Payer: Medicaid Other | Source: Ambulatory Visit | Attending: Obstetrics and Gynecology | Admitting: Obstetrics and Gynecology

## 2015-05-15 DIAGNOSIS — G40409 Other generalized epilepsy and epileptic syndromes, not intractable, without status epilepticus: Secondary | ICD-10-CM | POA: Diagnosis not present

## 2015-05-15 DIAGNOSIS — O26891 Other specified pregnancy related conditions, first trimester: Secondary | ICD-10-CM | POA: Insufficient documentation

## 2015-05-15 DIAGNOSIS — O99351 Diseases of the nervous system complicating pregnancy, first trimester: Secondary | ICD-10-CM | POA: Insufficient documentation

## 2015-05-15 DIAGNOSIS — R1032 Left lower quadrant pain: Secondary | ICD-10-CM | POA: Diagnosis not present

## 2015-05-15 DIAGNOSIS — O09291 Supervision of pregnancy with other poor reproductive or obstetric history, first trimester: Secondary | ICD-10-CM | POA: Diagnosis not present

## 2015-05-15 DIAGNOSIS — Z3A08 8 weeks gestation of pregnancy: Secondary | ICD-10-CM | POA: Insufficient documentation

## 2015-05-15 DIAGNOSIS — O9989 Other specified diseases and conditions complicating pregnancy, childbirth and the puerperium: Secondary | ICD-10-CM

## 2015-05-15 DIAGNOSIS — R109 Unspecified abdominal pain: Secondary | ICD-10-CM | POA: Diagnosis present

## 2015-05-15 DIAGNOSIS — O26899 Other specified pregnancy related conditions, unspecified trimester: Secondary | ICD-10-CM

## 2015-05-15 LAB — URINALYSIS, ROUTINE W REFLEX MICROSCOPIC
Bilirubin Urine: NEGATIVE
Glucose, UA: NEGATIVE mg/dL
Hgb urine dipstick: NEGATIVE
Ketones, ur: NEGATIVE mg/dL
Leukocytes, UA: NEGATIVE
Nitrite: NEGATIVE
Protein, ur: NEGATIVE mg/dL
Specific Gravity, Urine: 1.02 (ref 1.005–1.030)
Urobilinogen, UA: 0.2 mg/dL (ref 0.0–1.0)
pH: 8 (ref 5.0–8.0)

## 2015-05-15 MED ORDER — MECLIZINE HCL 25 MG PO TABS: 25.0000 mg | ORAL_TABLET | Freq: Three times a day (TID) | ORAL | Status: DC | PRN

## 2015-05-15 MED ORDER — GI COCKTAIL ~~LOC~~: 30.0000 mL | Freq: Once | ORAL | Status: DC

## 2015-05-15 MED ORDER — PROMETHAZINE HCL 25 MG PO TABS: 25.0000 mg | ORAL_TABLET | Freq: Four times a day (QID) | ORAL | Status: DC | PRN

## 2015-05-15 MED ORDER — MECLIZINE HCL 25 MG PO TABS
25.0000 mg | ORAL_TABLET | Freq: Once | ORAL | Status: AC
  Administered 2015-05-15: 25 mg via ORAL
  Filled 2015-05-15: qty 1

## 2015-05-15 NOTE — MAU Note (Signed)
Urine in lab 

## 2015-05-15 NOTE — MAU Note (Signed)
Pt reports she has been having n/v/d and abd cramping for several week. Tried her friend digleges and promethazine Rx without much releif.

## 2015-05-15 NOTE — MAU Provider Note (Signed)
History     CSN: [id number]  Arrival date and time: 05/15/15 1123   First Provider Initiated Contact with Patient 05/15/15 1359      Chief Complaint  Patient presents with  . Emesis  . Abdominal Cramping   HPI  Pt is 24 yo G5P0040 at [redacted]w[redacted]d pregnant who presents with abdominal cramping.  Pt was seen on 04/20/2015 for pregnancy verification and abdominal cramping with US showing probable IUGS and no YS. Pt last seen on 04/30/2015 with viable IUP.  Pt has 3 previous sabs and 1 tab.  Pt denies spotting or bleeding or pain with urination, but frequency with adequate amounts since pregnant.  Pt states that she has episodes of constipation and diarrhea.   Pt also c/o of being nauseated and vomiting.  Pt is not using anything for nausea or vomiting.   Pt's abd pain is intermittent on her right side radiating to her back, which is new for pt started last night. Pt has had continuous left lower quadrant pain during pregnancy. 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  Past Medical History  Diagnosis Date  . Seizures (HCC)     Last seizure 5 months ago  . Abortion history   . MVHQIONG(295.2)     Past Surgical History  Procedure Laterality Date  . Dilation and curettage of uterus      abortion    Family History  Problem Relation Age of Onset  . Asthma Brother   . Diabetes Mother     Social History  Substance Use Topics  . Smoking status: Current Every Day Smoker -- 0.50 packs/day for 5 years    Types: Cigarettes  . Smokeless tobacco: Never Used  . Alcohol Use: Yes     Comment: occasional    Allergies: No Known Allergies  Prescriptions prior to admission  Medication Sig Dispense Refill Last Dose  . levETIRAcetam (KEPPRA) 500 MG tablet One tab in morning and two tabs at bedtime. 90 tablet 11 05/15/2015 at Unknown time    Review of Systems  Constitutional:  Negative for fever and chills.  Gastrointestinal: Positive for nausea, vomiting, abdominal pain and diarrhea. Negative for constipation.  Genitourinary: Positive for frequency. Negative for dysuria and urgency.   Physical Exam   Blood pressure 122/65, pulse 70, temperature 97.8 F (36.6 C), resp. rate 18, height  (1.651 m), weight 221 lb 3.2 oz (100.336 kg), last menstrual period 03/15/2015.  Physical Exam  Nursing note and vitals reviewed. Constitutional: She is oriented to person, place, and time. She appears well-developed and well-nourished. No distress.  HENT:  Head: Normocephalic.  Eyes: Pupils are equal, round, and reactive to light.  Neck: Normal range of motion. Neck supple.  Respiratory: Effort normal.  GI: Soft. She exhibits no distension. There is no tenderness. There is no rebound.  Genitourinary:  Small- mod dark brown blood in vault; cervix clean, closed, NT; uterus and adnexa without appreciable enlargement or tenderness; exam complicated by habitus  Musculoskeletal: Normal range of motion.  Neurological: She is alert and oriented to person, place, and time.  Skin: Skin is warm and dry.  Psychiatric: She has a normal mood and affect.    MAU Course  Procedures Results for orders placed or performed during the hospital encounter of 05/15/15 (from the past 24 hour(s))  Urinalysis, Routine w reflex microscopic (not at [facility])  Status: Abnormal   Collection Time: 05/15/15 11:47 AM  Result Value Ref Range   Color, Urine YELLOW YELLOW   APPearance CLOUDY (A) CLEAR   Specific Gravity, Urine 1.020 1.005 - 1.030   pH 8.0 5.0 - 8.0   Glucose, UA NEGATIVE NEGATIVE mg/dL   Hgb urine dipstick NEGATIVE NEGATIVE   Bilirubin Urine NEGATIVE NEGATIVE   Ketones, ur NEGATIVE NEGATIVE mg/dL   Protein, ur NEGATIVE NEGATIVE mg/dL   Urobilinogen, UA 0.2 0.0 - 1.0 mg/dL   Nitrite NEGATIVE NEGATIVE   Leukocytes, UA NEGATIVE NEGATIVE  [place] Ob Transvaginal  05/15/2015  CLINICAL  DATA:  Known intrauterine gestation with left lower quadrant pain and cramping. Subsequent encounter. EXAM: TRANSVAGINAL OB ULTRASOUND TECHNIQUE: Transvaginal ultrasound was performed for complete evaluation of the gestation as well as the maternal uterus, adnexal regions, and pelvic cul-de-sac. COMPARISON:  04/29/2015. FINDINGS: Intrauterine gestational sac: Visualized/normal in shape. Yolk sac:  Visualized Embryo:  Visualized Cardiac Activity: Visualized Heart Rate: 175 bpm CRL:   18.4  Mm   8 w 2 d                  US EDC: 12/23/2015. Maternal uterus/adnexae: Small subchronic hemorrhage noted. A new 3.7 cm simple appearing cystic structure is seen adjacent to the right ovary. The complex lesion in the right ovary measures slightly smaller today 1.9 cm compared to 2.2 cm previously. Trace amount of free fluid is noted in the cul-de-sac IMPRESSION: Single living intrauterine gestation at 8 week 2 day gestational age by crown-rump length. New simple cystic structure exophytic from or immediately adjacent to the right ovary. Attention on follow-up recommended. Electronically Signed   By: Kennith CenterEric  Mansell M.D.   On: 05/15/2015 16:11      Assessment and Plan  Abdominal pain in pregnancy 1st trimester- right ov. Cyst Viable IUP 2685w2d pregnancy Poor OB hx with 1st trimester SAB x3 Grand mal seizures- continue Kepra [facility] note sent to clinic Pt may also proceed with HD in case pt can get in there sooner- per Dr. Jolayne Pantheronstant Return if increase pain or bleeding  Shanon Seawright 05/15/2015, 3:35 PM

## 2015-05-15 NOTE — Discharge Instructions (Signed)

## 2015-05-25 ENCOUNTER — Inpatient Hospital Stay (HOSPITAL_COMMUNITY)
Admission: AD | Admit: 2015-05-25 | Discharge: 2015-05-25 | Disposition: A | Discharge status: Home / Self Care | Payer: Medicaid Other | Source: Ambulatory Visit | Attending: Family Medicine | Admitting: Family Medicine

## 2015-05-25 ENCOUNTER — Inpatient Hospital Stay (HOSPITAL_COMMUNITY): Payer: Medicaid Other

## 2015-05-25 DIAGNOSIS — O209 Hemorrhage in early pregnancy, unspecified: Secondary | ICD-10-CM | POA: Diagnosis present

## 2015-05-25 DIAGNOSIS — O98811 Other maternal infectious and parasitic diseases complicating pregnancy, first trimester: Secondary | ICD-10-CM

## 2015-05-25 DIAGNOSIS — O3481 Maternal care for other abnormalities of pelvic organs, first trimester: Secondary | ICD-10-CM | POA: Diagnosis not present

## 2015-05-25 DIAGNOSIS — A749 Chlamydial infection, unspecified: Secondary | ICD-10-CM

## 2015-05-25 DIAGNOSIS — N83209 Unspecified ovarian cyst, unspecified side: Secondary | ICD-10-CM

## 2015-05-25 DIAGNOSIS — Z3A09 9 weeks gestation of pregnancy: Secondary | ICD-10-CM | POA: Insufficient documentation

## 2015-05-25 DIAGNOSIS — O208 Other hemorrhage in early pregnancy: Secondary | ICD-10-CM | POA: Diagnosis not present

## 2015-05-25 DIAGNOSIS — N8311 Corpus luteum cyst of right ovary: Secondary | ICD-10-CM | POA: Insufficient documentation

## 2015-05-25 DIAGNOSIS — O98311 Other infections with a predominantly sexual mode of transmission complicating pregnancy, first trimester: Secondary | ICD-10-CM

## 2015-05-25 MED ORDER — AZITHROMYCIN 250 MG PO TABS
1000.0000 mg | ORAL_TABLET | Freq: Once | ORAL | Status: AC
  Administered 2015-05-25: 1000 mg via ORAL
  Filled 2015-05-25: qty 4

## 2015-05-25 NOTE — MAU Note (Signed)
Urine in lab 

## 2015-05-25 NOTE — Discharge Instructions (Signed)
Vaginal Bleeding During Pregnancy, First Trimester A small amount of bleeding (spotting) from the vagina is relatively common in early pregnancy. It usually stops on its own. Various things may cause bleeding or spotting in early pregnancy. Some bleeding may be related to the pregnancy, and some may not. In most cases, the bleeding is normal and is not a problem. However, bleeding can also be a sign of something serious. Be sure to tell your health care provider about any vaginal bleeding right away. Some possible causes of vaginal bleeding during the first trimester include:  Infection or inflammation of the cervix.  Growths (polyps) on the cervix.  Miscarriage or threatened miscarriage.  Pregnancy tissue has developed outside of the uterus and in a fallopian tube (tubal pregnancy).  Tiny cysts have developed in the uterus instead of pregnancy tissue (molar pregnancy). HOME CARE INSTRUCTIONS  Watch your condition for any changes. The following actions may help to lessen any discomfort you are feeling:  Follow your health care provider's instructions for limiting your activity. If your health care provider orders bed rest, you may need to stay in bed and only get up to use the bathroom. However, your health care provider may allow you to continue light activity.  If needed, make plans for someone to help with your regular activities and responsibilities while you are on bed rest.  Keep track of the number of pads you use each day, how often you change pads, and how soaked (saturated) they are. Write this down.  Do not use tampons. Do not douche.  Do not have sexual intercourse or orgasms until approved by your health care provider.  If you pass any tissue from your vagina, save the tissue so you can show it to your health care provider.  Only take over-the-counter or prescription medicines as directed by your health care provider.  Do not take aspirin because it can make you  bleed.  Keep all follow-up appointments as directed by your health care provider. SEEK MEDICAL CARE IF:  You have any vaginal bleeding during any part of your pregnancy.  You have cramps or labor pains.  You have a fever, not controlled by medicine. SEEK IMMEDIATE MEDICAL CARE IF:   You have severe cramps in your back or belly (abdomen).  You pass large clots or tissue from your vagina.  Your bleeding increases.  You feel light-headed or weak, or you have fainting episodes.  You have chills.  You are leaking fluid or have a gush of fluid from your vagina.  You pass out while having a bowel movement. MAKE SURE YOU:  Understand these instructions.  Will watch your condition.  Will get help right away if you are not doing well or get worse.   This information is not intended to replace advice given to you by your health care provider. Make sure you discuss any questions you have with your health care provider.   Document Released: 04/20/2005 Document Revised: 07/16/2013 Document Reviewed: 03/18/2013 Elsevier Interactive Patient Education 2016 [employer]  Expedited Partner Therapy:  Information Sheet for Patients and Partners               You have been offered expedited partner therapy (EPT). This information sheet contains important information and warnings you need to be aware of, so please read it carefully.   Expedited Partner Therapy (EPT) is the clinical practice of treating the sexual partners of persons who receive chlamydia, gonorrhea, or trichomoniasis diagnoses by providing medications or prescriptions  to the patient. Patients then provide partners with these therapies without the health-care provider having examined the partner. In other words, EPT is a convenient, fast and private way for patients to help their sexual partners get treated.   Chlamydia and gonorrhea are bacterial infections you get from having sex with a person who is already infected.  Trichomoniasis (or trich) is a very common sexually transmitted infection (STI) that is caused by infection with a protozoan parasite called Trichomonas vaginalis.  Many people with these infections dont know it because they feel fine, but without treatment these infections can cause serious health problems, such as pelvic inflammatory disease, ectopic pregnancy, infertility and increased risk of HIV.   It is important to get treated as soon as possible to protect your health, to avoid spreading these infections to others, and to prevent yourself from becoming re-infected. The good news is these infections can be easily cured with proper antibiotic medicine. The best way to take care of your self is to see a doctor or go to your local health department. If you are not able to see a doctor or other medical provider, you should take EPT.    Recommended Medication: EPT for Chlamydia:  Azithromycin (Zithromax) 1 gram orally in a single dose EPT for Gonorrhea:  Cefixime (Suprax) 400 milligrams orally in a single dose PLUS azithromycin (Zithromax) 1 gram orally in a single dose EPT for Trichomoniasis:  Metronidazole (Flagyl) 2 grams orally in a single dose   These medicines are very safe. However, you should not take them if you have ever had an allergic reaction (like a rash) to any of these medicines: azithromycin (Zithromax), erythromycin, clarithromycin (Biaxin), metronidazole (Flagyl), tinidazole (Tindimax). If you are uncertain about whether you have an allergy, call your medical provider or pharmacist before taking this medicine. If you have a serious, long-term illness like kidney, liver or heart disease, colitis or stomach problems, or you are currently taking other prescription medication, talk to your provider before taking this medication.   Women: If you have lower belly pain, pain during sex, vomiting, or a fever, do not take this medicine. Instead, you should see a medical provider to be  certain you do not have pelvic inflammatory disease (PID). PID can be serious and lead to infertility, pregnancy problems or chronic pelvic pain.   Pregnant Women: It is very important for you to see a doctor to get pregnancy services and pre-natal care. These antibiotics for EPT are safe for pregnant women, but you still need to see a medical provider as soon as possible. It is also important to note that Doxycycline is an alternative therapy for chlamydia, but it should not be taken by someone who is pregnant.   Men: If you have pain or swelling in the testicles or a fever, do not take this medicine and see a medical provider.     Men who have sex with men (MSM): MSM in [place] continue to experience high rates of syphilis and HIV. Many MSM with gonorrhea or chlamydia could also have syphilis and/or HIV and not know it. If you are a man who has sex with other men, it is very important that you see a medical provider and are tested for HIV and syphilis. EPT is not recommended for gonorrhea for MSM.  Recommended treatment for gonorrhea for MSM is Rocephin (shot) AND azithromycin due to decreased cure rate.  Please see your medical provider if this is the case.    Along  with this information sheet is a prescription for the medicine. If you receive a prescription it will be in your name and will indicate your date of birth, or it will be in the name of Expedited Partner Therapy.   In either case, you can have the prescription filled at a pharmacy. You will be responsible for the cost of the medicine, unless you have prescription drug coverage. In that case, you could provide your name so the pharmacy could bill your health plan.   Take the medication as directed. Some people will have a mild, upset stomach, which does not last long. AVOID alcohol 24 hours after taking metronidazole (Flagyl) to reduce the possibility of a disulfiram-like reaction (severe vomiting and abdominal pain).  After taking  the medicine, do not have sex for 7 days. Do not share this medicine or give it to anyone else. It is important to tell everyone you have had sex with in the last 60 days that they need to go and get tested for sexually transmitted infections.   Ways to prevent these and other sexually transmitted infections (STIs):    Abstain from sex. This is the only sure way to avoid getting an STI.   Use barrier methods, such as condoms, consistently and correctly.   Limit the number of sexual partners.   Have regular physical exams, including testing for STIs.   For more information about EPT or other issues pertaining to an STI, please contact your medical provider or the [facility] at [phone] or [email or web address]

## 2015-05-25 NOTE — MAU Note (Signed)
Pt C/O small amount of bleeding that started this a.m. - noticed when she woke up.  Denies pain.

## 2015-05-28 ENCOUNTER — Ambulatory Visit (INDEPENDENT_AMBULATORY_CARE_PROVIDER_SITE_OTHER): Payer: Self-pay | Admitting: Obstetrics & Gynecology

## 2015-05-28 VITALS — BP 107/63 | HR 72 | Wt 223.5 lb

## 2015-05-28 DIAGNOSIS — O0991 Supervision of high risk pregnancy, unspecified, first trimester: Secondary | ICD-10-CM

## 2015-05-28 DIAGNOSIS — O0990 Supervision of high risk pregnancy, unspecified, unspecified trimester: Secondary | ICD-10-CM

## 2015-05-28 DIAGNOSIS — Z113 Encounter for screening for infections with a predominantly sexual mode of transmission: Secondary | ICD-10-CM

## 2015-05-28 DIAGNOSIS — O99351 Diseases of the nervous system complicating pregnancy, first trimester: Secondary | ICD-10-CM

## 2015-05-28 DIAGNOSIS — O2621 Pregnancy care for patient with recurrent pregnancy loss, first trimester: Secondary | ICD-10-CM

## 2015-05-28 DIAGNOSIS — G40909 Epilepsy, unspecified, not intractable, without status epilepticus: Secondary | ICD-10-CM

## 2015-05-28 DIAGNOSIS — Z124 Encounter for screening for malignant neoplasm of cervix: Secondary | ICD-10-CM

## 2015-05-28 DIAGNOSIS — O99350 Diseases of the nervous system complicating pregnancy, unspecified trimester: Secondary | ICD-10-CM

## 2015-05-28 DIAGNOSIS — O2620 Pregnancy care for patient with recurrent pregnancy loss, unspecified trimester: Secondary | ICD-10-CM | POA: Insufficient documentation

## 2015-05-28 HISTORY — DX: Supervision of high risk pregnancy, unspecified, unspecified trimester: O09.90

## 2015-05-28 LAB — POCT URINALYSIS DIP (DEVICE)
Bilirubin Urine: NEGATIVE
Glucose, UA: NEGATIVE mg/dL
HGB URINE DIPSTICK: NEGATIVE
KETONES UR: NEGATIVE mg/dL
LEUKOCYTES UA: NEGATIVE
Nitrite: NEGATIVE
Protein, ur: NEGATIVE mg/dL
SPECIFIC GRAVITY, URINE: 1.025 (ref 1.005–1.030)
UROBILINOGEN UA: 0.2 mg/dL (ref 0.0–1.0)
pH: 6.5 (ref 5.0–8.0)

## 2015-05-28 MED ORDER — FOLIC ACID 1 MG PO TABS: 1.0000 mg | ORAL_TABLET | Freq: Every day | ORAL | Status: DC

## 2015-05-28 NOTE — Progress Notes (Signed)
Subjective:    Brittany Hendrix is a 24 y.o. G5P0040 [redacted]w[redacted]d being seen today for her first obstetrical visit.  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  She is on Keppra, followed by [facility].  Last seizure was over a year ago.  Pregnancy history fully reviewed.  Patient reports mild nausea.  HISTORY: OB History  Gravida Para Term Preterm AB SAB TAB Ectopic Multiple Living     # Outcome Date GA Lbr Len/2nd Weight Sex Delivery Anes PTL Lv  5 Current           4 SAB              Comments: System Generated. Please review and update pregnancy details.  3 SAB           2 SAB           1 TAB              Past Medical History  Diagnosis Date  . Seizures (HCC)     Last seizure 5 months ago  . Abortion history   . [id number](782.9)    Past Surgical History  Procedure Laterality Date  . Dilation and curettage of uterus      abortion   Family History  Problem Relation Age of Onset  . Asthma Brother   . Diabetes Mother      Exam  BP 107/63 mmHg  Pulse 72  Wt 223 lb 8 oz (101.379 kg)  LMP 03/15/2015  Uterus:     Pelvic Exam:    Perineum: No Hemorrhoids, Normal Perineum   Vulva: normal   Vagina:  normal mucosa, normal discharge   Cervix: no bleeding following Pap, no cervical motion tenderness, no lesions and nulliparous appearance   Adnexa: normal adnexa and no mass, fullness, tenderness   Bony Pelvis: average  System: Breast:  normal appearance, no masses or tenderness   Skin: normal coloration and turgor, no rashes   Neurologic: oriented, normal, negative   Extremities: normal strength, tone, and muscle mass, ROM of all joints is normal   HEENT PERRLA and extra ocular movement intact   Mouth/Teeth mucous membranes moist, pharynx normal without lesions and dental hygiene good   Neck supple and no masses   Cardiovascular: regular rate and rhythm, no murmurs or gallops   Respiratory:  appears  well, vitals normal, no respiratory distress, acyanotic, normal RR, chest clear, no wheezing, crepitations, rhonchi, normal symmetric air entry   Abdomen: soft, non-tender; bowel sounds normal; no masses,  no organomegaly   Urinary: urethral meatus normal     Had + fetal HR on scan done on 05/25/15 Assessment:    Pregnancy: G5P0040 Patient Active Problem List   Diagnosis Date Noted  . Seizure disorder in pregnancy, antepartum (HCC) 05/28/2015  . Supervision of high-risk pregnancy 05/28/2015  . 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 Seizures (HCC) 12/23/2012     Plan:   1. Seizure disorder in pregnancy, antepartum, first trimester (HCC) 2. Supervision of high-risk pregnancy, first trimester Patient advised to call and make an appointment with Dr. Terrace Arabia.  Continue Keppra, level checked today.  Folic acid 1 mg daily prescribed in addition to prenatal vitamins. Flu vaccine declined. - Glucose Tolerance, 1 HR (50g) w/o Fasting - Prenatal Profile - Culture, OB Urine - Prescript Monitor Profile(19) - [place] MFM Fetal Nuchal Translucency; Future -  folic acid (FOLVITE) 1 MG tablet; Take 1 tablet (1 mg total) by mouth daily.  Dispense: 30 tablet; Refill: 10 - Cytology - PAP - Flu Vaccine QUAD 36+ mos IM - Levetiracetam level  3. 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 Ultrasound discussed; fetal survey: to be ordered later. The nature of [facility] with multiple MDs and other Advanced Practice Providers was explained to patient; also emphasized that residents, students are part of our team. Follow up in 4  weeks. Routine obstetric precautions reviewed.  Tereso NewcomerANYANWU,Tempie Gibeault A, MD 05/28/2015

## 2015-05-28 NOTE — Patient Instructions (Signed)
First Trimester of Pregnancy The first trimester of pregnancy is from week 1 until the end of week 12 (months 1 through 3). A week after a sperm fertilizes an egg, the egg will implant on the wall of the uterus. This embryo will begin to develop into a baby. Genes from you and your partner are forming the baby. The female genes determine whether the baby is a boy or a girl. At 6-8 weeks, the eyes and face are formed, and the heartbeat can be seen on ultrasound. At the end of 12 weeks, all the baby's organs are formed.  Now that you are pregnant, you will want to do everything you can to have a healthy baby. Two of the most important things are to get good prenatal care and to follow your health care provider's instructions. Prenatal care is all the medical care you receive before the baby's birth. This care will help prevent, find, and treat any problems during the pregnancy and childbirth. BODY CHANGES Your body goes through many changes during pregnancy. The changes vary from woman to woman.   You may gain or lose a couple of pounds at first.  You may feel sick to your stomach (nauseous) and throw up (vomit). If the vomiting is uncontrollable, call your health care provider.  You may tire easily.  You may develop headaches that can be relieved by medicines approved by your health care provider.  You may urinate more often. Painful urination may mean you have a bladder infection.  You may develop heartburn as a result of your pregnancy.  You may develop constipation because certain hormones are causing the muscles that push waste through your intestines to slow down.  You may develop hemorrhoids or swollen, bulging veins (varicose veins).  Your breasts may begin to grow larger and become tender. Your nipples may stick out more, and the tissue that surrounds them (areola) may become darker.  Your gums may bleed and may be sensitive to brushing and flossing.  Dark spots or blotches (chloasma,  mask of pregnancy) may develop on your face. This will likely fade after the baby is born.  Your menstrual periods will stop.  You may have a loss of appetite.  You may develop cravings for certain kinds of food.  You may have changes in your emotions from day to day, such as being excited to be pregnant or being concerned that something may go wrong with the pregnancy and baby.  You may have more vivid and strange dreams.  You may have changes in your hair. These can include thickening of your hair, rapid growth, and changes in texture. Some women also have hair loss during or after pregnancy, or hair that feels dry or thin. Your hair will most likely return to normal after your baby is born. WHAT TO EXPECT AT YOUR PRENATAL VISITS During a routine prenatal visit:  You will be weighed to make sure you and the baby are growing normally.  Your blood pressure will be taken.  Your abdomen will be measured to track your baby's growth.  The fetal heartbeat will be listened to starting around week 10 or 12 of your pregnancy.  Test results from any previous visits will be discussed. Your health care provider may ask you:  How you are feeling.  If you are feeling the baby move.  If you have had any abnormal symptoms, such as leaking fluid, bleeding, severe headaches, or abdominal cramping.  If you are using any tobacco products,   including cigarettes, chewing tobacco, and electronic cigarettes.  If you have any questions. Other tests that may be performed during your first trimester include:  Blood tests to find your blood type and to check for the presence of any previous infections. They will also be used to check for low iron levels (anemia) and Rh antibodies. Later in the pregnancy, blood tests for diabetes will be done along with other tests if problems develop.  Urine tests to check for infections, diabetes, or protein in the urine.  An ultrasound to confirm the proper growth  and development of the baby.  An amniocentesis to check for possible genetic problems.  Fetal screens for spina bifida and Down syndrome.  You may need other tests to make sure you and the baby are doing well.  HIV (human immunodeficiency virus) testing. Routine prenatal testing includes screening for HIV, unless you choose not to have this test. HOME CARE INSTRUCTIONS  Medicines  Follow your health care provider's instructions regarding medicine use. Specific medicines may be either safe or unsafe to take during pregnancy.  Take your prenatal vitamins as directed.  If you develop constipation, try taking a stool softener if your health care provider approves. Diet  Eat regular, well-balanced meals. Choose a variety of foods, such as meat or vegetable-based protein, fish, milk and low-fat dairy products, vegetables, fruits, and whole grain breads and cereals. Your health care provider will help you determine the amount of weight gain that is right for you.  Avoid raw meat and uncooked cheese. These carry germs that can cause birth defects in the baby.  Eating four or five small meals rather than three large meals a day may help relieve nausea and vomiting. If you start to feel nauseous, eating a few soda crackers can be helpful. Drinking liquids between meals instead of during meals also seems to help nausea and vomiting.  If you develop constipation, eat more high-fiber foods, such as fresh vegetables or fruit and whole grains. Drink enough fluids to keep your urine clear or pale yellow. Activity and Exercise  Exercise only as directed by your health care provider. Exercising will help you:  Control your weight.  Stay in shape.  Be prepared for labor and delivery.  Experiencing pain or cramping in the lower abdomen or low back is a good sign that you should stop exercising. Check with your health care provider before continuing normal exercises.  Try to avoid standing for long  periods of time. Move your legs often if you must stand in one place for a long time.  Avoid heavy lifting.  Wear low-heeled shoes, and practice good posture.  You may continue to have sex unless your health care provider directs you otherwise. Relief of Pain or Discomfort  Wear a good support bra for breast tenderness.   Take warm sitz baths to soothe any pain or discomfort caused by hemorrhoids. Use hemorrhoid cream if your health care provider approves.   Rest with your legs elevated if you have leg cramps or low back pain.  If you develop varicose veins in your legs, wear support hose. Elevate your feet for 15 minutes, 3-4 times a day. Limit salt in your diet. Prenatal Care  Schedule your prenatal visits by the twelfth week of pregnancy. They are usually scheduled monthly at first, then more often in the last 2 months before delivery.  Write down your questions. Take them to your prenatal visits.  Keep all your prenatal visits as directed by your   health care provider. Safety  Wear your seat belt at all times when driving.  Make a list of emergency phone numbers, including numbers for family, friends, the hospital, and police and fire departments. General Tips  Ask your health care provider for a referral to a local prenatal education class. Begin classes no later than at the beginning of month 6 of your pregnancy.  Ask for help if you have counseling or nutritional needs during pregnancy. Your health care provider can offer advice or refer you to specialists for help with various needs.  Do not use hot tubs, steam rooms, or saunas.  Do not douche or use tampons or scented sanitary pads.  Do not cross your legs for long periods of time.  Avoid cat litter boxes and soil used by cats. These carry germs that can cause birth defects in the baby and possibly loss of the fetus by miscarriage or stillbirth.  Avoid all smoking, herbs, alcohol, and medicines not prescribed by  your health care provider. Chemicals in these affect the formation and growth of the baby.  Do not use any tobacco products, including cigarettes, chewing tobacco, and electronic cigarettes. If you need help quitting, ask your health care provider. You may receive counseling support and other resources to help you quit.  Schedule a dentist appointment. At home, brush your teeth with a soft toothbrush and be gentle when you floss. SEEK MEDICAL CARE IF:   You have dizziness.  You have mild pelvic cramps, pelvic pressure, or nagging pain in the abdominal area.  You have persistent nausea, vomiting, or diarrhea.  You have a bad smelling vaginal discharge.  You have pain with urination.  You notice increased swelling in your face, hands, legs, or ankles. SEEK IMMEDIATE MEDICAL CARE IF:   You have a fever.  You are leaking fluid from your vagina.  You have spotting or bleeding from your vagina.  You have severe abdominal cramping or pain.  You have rapid weight gain or loss.  You vomit blood or material that looks like coffee grounds.  You are exposed to German measles and have never had them.  You are exposed to fifth disease or chickenpox.  You develop a severe headache.  You have shortness of breath.  You have any kind of trauma, such as from a fall or a car accident.   This information is not intended to replace advice given to you by your health care provider. Make sure you discuss any questions you have with your health care provider.   Document Released: 07/05/2001 Document Revised: 08/01/2014 Document Reviewed: 05/21/2013 Elsevier Interactive Patient Education 2016 [employer]  

## 2015-05-28 NOTE — Progress Notes (Signed)
1st trimester screen 06/19/15 @ 10a with [facility].

## 2015-05-29 LAB — GLUCOSE TOLERANCE, 1 HOUR (50G) W/O FASTING: Glucose, 1 Hour GTT: 124 mg/dL (ref 70–140)

## 2015-05-29 LAB — PRENATAL PROFILE (SOLSTAS)
Antibody Screen: NEGATIVE
BASOS ABS: 0 10*3/uL (ref 0.0–0.1)
Basophils Relative: 0 % (ref 0–1)
Eosinophils Absolute: 0.2 10*3/uL (ref 0.0–0.7)
Eosinophils Relative: 2 % (ref 0–5)
HEMATOCRIT: 33.1 % — AB (ref 36.0–46.0)
HEP B S AG: NEGATIVE
HIV: NONREACTIVE
Hemoglobin: 11.5 g/dL — ABNORMAL LOW (ref 12.0–15.0)
LYMPHS ABS: 2.4 10*3/uL (ref 0.7–4.0)
LYMPHS PCT: 29 % (ref 12–46)
MCH: 31.5 pg (ref 26.0–34.0)
MCHC: 34.7 g/dL (ref 30.0–36.0)
MCV: 90.7 fL (ref 78.0–100.0)
MPV: 9.6 fL (ref 8.6–12.4)
Monocytes Absolute: 0.4 10*3/uL (ref 0.1–1.0)
Monocytes Relative: 5 % (ref 3–12)
NEUTROS ABS: 5.3 10*3/uL (ref 1.7–7.7)
NEUTROS PCT: 64 % (ref 43–77)
PLATELETS: 274 10*3/uL (ref 150–400)
RBC: 3.65 MIL/uL — AB (ref 3.87–5.11)
RDW: 12.9 % (ref 11.5–15.5)
Rh Type: POSITIVE
Rubella: 6.47 Index — ABNORMAL HIGH (ref <0.90)
WBC: 8.3 10*3/uL (ref 4.0–10.5)

## 2015-05-29 LAB — CARDIOLIPIN ANTIBODIES, IGG, IGM, IGA: Anticardiolipin IgA: <11 [APL'U]

## 2015-05-30 LAB — RFX PTT-LA W/RFX TO HEX PHASE CONF: PTT-LA SCREEN: 38 s (ref <=40)

## 2015-05-30 LAB — RFX DRVVT SCR W/RFLX CONF 1:1 MIX: DRVVT SCREEN: 38 s (ref <=45)

## 2015-05-30 LAB — LUPUS ANTICOAGULANT PANEL

## 2015-05-31 LAB — LEVETIRACETAM LEVEL: Keppra (Levetiracetam): 13.7 ug/mL

## 2015-06-01 LAB — FACTOR 5 LEIDEN

## 2015-06-01 LAB — ANTITHROMBIN III: AntiThromb III Func: 93 % activity (ref 80–120)

## 2015-06-01 LAB — CYTOLOGY - PAP

## 2015-06-01 LAB — PROTHROMBIN GENE MUTATION

## 2015-06-01 LAB — PROTEIN C, TOTAL: Protein C Antigen: 99 % (ref 70–140)

## 2015-06-01 LAB — PROTEIN S ACTIVITY: Protein S Activity: 52 % — ABNORMAL LOW (ref 60–140)

## 2015-06-01 LAB — PROTEIN C ACTIVITY: Protein C Activity: 82 % (ref 70–180)

## 2015-06-01 LAB — PROTEIN S, TOTAL: PROTEIN S ANTIGEN, TOTAL: 74 % (ref 70–140)

## 2015-06-19 ENCOUNTER — Ambulatory Visit (HOSPITAL_COMMUNITY)
Admission: RE | Admit: 2015-06-19 | Discharge: 2015-06-19 | Disposition: A | Discharge status: Home / Self Care | Payer: Medicaid Other | Source: Ambulatory Visit | Attending: Obstetrics & Gynecology | Admitting: Obstetrics & Gynecology

## 2015-06-19 ENCOUNTER — Encounter (HOSPITAL_COMMUNITY): Payer: Self-pay

## 2015-06-19 VITALS — BP 131/74 | HR 97 | Wt 221.4 lb

## 2015-06-19 DIAGNOSIS — O99331 Smoking (tobacco) complicating pregnancy, first trimester: Secondary | ICD-10-CM | POA: Diagnosis not present

## 2015-06-19 DIAGNOSIS — O09521 Supervision of elderly multigravida, first trimester: Secondary | ICD-10-CM | POA: Insufficient documentation

## 2015-06-19 DIAGNOSIS — O99211 Obesity complicating pregnancy, first trimester: Secondary | ICD-10-CM | POA: Insufficient documentation

## 2015-06-19 DIAGNOSIS — Z3A13 13 weeks gestation of pregnancy: Secondary | ICD-10-CM | POA: Insufficient documentation

## 2015-06-19 DIAGNOSIS — Z36 Encounter for antenatal screening of mother: Secondary | ICD-10-CM | POA: Diagnosis not present

## 2015-06-19 DIAGNOSIS — G40909 Epilepsy, unspecified, not intractable, without status epilepticus: Secondary | ICD-10-CM

## 2015-06-19 DIAGNOSIS — O99351 Diseases of the nervous system complicating pregnancy, first trimester: Secondary | ICD-10-CM

## 2015-06-19 DIAGNOSIS — O99352 Diseases of the nervous system complicating pregnancy, second trimester: Secondary | ICD-10-CM

## 2015-06-19 DIAGNOSIS — O0992 Supervision of high risk pregnancy, unspecified, second trimester: Secondary | ICD-10-CM

## 2015-06-24 ENCOUNTER — Telehealth: Payer: Self-pay | Admitting: Neurology

## 2015-06-24 NOTE — Telephone Encounter (Signed)
She will continue her medication, as prescribed.  A follow up appt has been made for her next week.

## 2015-06-24 NOTE — Telephone Encounter (Signed)
Pt called to let Dr Terrace ArabiaYan know she is 13 wks 6 days pregnant. Please call and advise

## 2015-07-03 ENCOUNTER — Other Ambulatory Visit (HOSPITAL_COMMUNITY): Payer: Self-pay

## 2015-07-06 ENCOUNTER — Ambulatory Visit: Payer: Self-pay | Admitting: Neurology

## 2015-07-06 ENCOUNTER — Telehealth: Payer: Self-pay | Admitting: *Deleted

## 2015-07-06 NOTE — Telephone Encounter (Signed)
Patient no showed follow-up appointment.

## 2015-07-07 ENCOUNTER — Encounter: Payer: Self-pay | Admitting: Neurology

## 2015-07-13 ENCOUNTER — Encounter (HOSPITAL_COMMUNITY): Payer: Self-pay

## 2015-07-13 ENCOUNTER — Inpatient Hospital Stay (HOSPITAL_COMMUNITY)
Admission: AD | Admit: 2015-07-13 | Discharge: 2015-07-13 | Disposition: A | Discharge status: Home / Self Care | Payer: Medicaid Other | Source: Ambulatory Visit | Attending: Obstetrics and Gynecology | Admitting: Obstetrics and Gynecology

## 2015-07-13 DIAGNOSIS — G43909 Migraine, unspecified, not intractable, without status migrainosus: Secondary | ICD-10-CM | POA: Diagnosis present

## 2015-07-13 DIAGNOSIS — O99332 Smoking (tobacco) complicating pregnancy, second trimester: Secondary | ICD-10-CM | POA: Diagnosis not present

## 2015-07-13 DIAGNOSIS — O99352 Diseases of the nervous system complicating pregnancy, second trimester: Secondary | ICD-10-CM | POA: Diagnosis not present

## 2015-07-13 DIAGNOSIS — O26892 Other specified pregnancy related conditions, second trimester: Secondary | ICD-10-CM | POA: Insufficient documentation

## 2015-07-13 DIAGNOSIS — F17210 Nicotine dependence, cigarettes, uncomplicated: Secondary | ICD-10-CM | POA: Insufficient documentation

## 2015-07-13 DIAGNOSIS — G40909 Epilepsy, unspecified, not intractable, without status epilepticus: Secondary | ICD-10-CM | POA: Diagnosis not present

## 2015-07-13 DIAGNOSIS — Z3A16 16 weeks gestation of pregnancy: Secondary | ICD-10-CM | POA: Diagnosis not present

## 2015-07-13 LAB — URINALYSIS, ROUTINE W REFLEX MICROSCOPIC
BILIRUBIN URINE: NEGATIVE
GLUCOSE, UA: NEGATIVE mg/dL
HGB URINE DIPSTICK: NEGATIVE
Ketones, ur: >80 mg/dL — AB
Leukocytes, UA: NEGATIVE
Nitrite: NEGATIVE
PH: 6 (ref 5.0–8.0)
Protein, ur: NEGATIVE mg/dL
SPECIFIC GRAVITY, URINE: 1.02 (ref 1.005–1.030)

## 2015-07-13 MED ORDER — ONDANSETRON 8 MG PO TBDP
8.0000 mg | ORAL_TABLET | Freq: Once | ORAL | Status: AC
  Administered 2015-07-13: 8 mg via ORAL
  Filled 2015-07-13: qty 1

## 2015-07-13 MED ORDER — IBUPROFEN 600 MG PO TABS
600.0000 mg | ORAL_TABLET | Freq: Once | ORAL | Status: AC
  Administered 2015-07-13: 600 mg via ORAL
  Filled 2015-07-13: qty 1

## 2015-07-13 MED ORDER — IBUPROFEN 600 MG PO TABS: 600.0000 mg | ORAL_TABLET | Freq: Four times a day (QID) | ORAL | Status: DC | PRN

## 2015-07-13 MED ORDER — ONDANSETRON 8 MG PO TBDP: 8.0000 mg | ORAL_TABLET | Freq: Three times a day (TID) | ORAL | Status: DC | PRN

## 2015-07-13 NOTE — MAU Provider Note (Signed)
History    Brittany Hendrix is a 24 y.o. G5P0 at 16.4wks who presents, unannounced, for migraine.  Patient states migraine has been ongoing for the last 4 days and unresponsive to tylenol.  Patient also reports vomiting this morning and feels "dehydrated."  Patient reports migraine is 8/10 and inability to eat or drink.  Patient states mild nausea is present, but denies fever, sore throat, chills, diarrhea, or issues with urination.  Patient does reporting constipation "for about a month," but had a bowel movement last night that was not difficult to pass.  Patient with history of seizures and is currently on kepra, but did not attend her last neurology appt.  Patient reports history of migraines that have been responsive to "about 500mg  of Aleve."    Patient Active Problem List   Diagnosis Date Noted  . Seizure disorder in pregnancy, antepartum (HCC) 05/28/2015  . Supervision of high-risk pregnancy 05/28/2015  . 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 Seizures (HCC) 12/23/2012    Chief Complaint  Patient presents with  . Dehydration   HPI  OB History    Gravida Para Term Preterm AB TAB SAB Ectopic Multiple Living   5 0 0 0 4 1 3 0 0 0       Past Medical History  Diagnosis Date  . Seizures (HCC)     Last seizure 5 months ago  . Abortion history   . ZOXWRUEA(540.9Headache(784.0)     Past Surgical History  Procedure Laterality Date  . Dilation and curettage of uterus      abortion    Family History  Problem Relation Age of Onset  . Asthma Brother   . Diabetes Mother     Social History  Substance Use Topics  . Smoking status: Current Every Day Smoker -- 0.50 packs/day for 5 years    Types: Cigarettes  . Smokeless tobacco: Never Used  . Alcohol Use: Yes     Comment: occasional    Allergies: No Known Allergies  Prescriptions prior to admission  Medication Sig Dispense Refill Last Dose  . levETIRAcetam (KEPPRA) 500 MG tablet One tab in  morning and two tabs at bedtime. 90 tablet 11 07/13/2015 at Unknown time  . Prenatal Vit-Fe Fumarate-FA (MULTIVITAMIN-PRENATAL) 27-0.8 MG TABS tablet Take 1 tablet by mouth daily at 12 noon.   Past Week at Unknown time  . folic acid (FOLVITE) 1 MG tablet Take 1 tablet (1 mg total) by mouth daily. (Patient not taking: Reported on 07/13/2015) 30 tablet 10 Not Taking at Unknown time  . promethazine (PHENERGAN) 25 MG tablet Take 1 tablet (25 mg total) by mouth every 6 (six) hours as needed for nausea or vomiting. (Patient not taking: Reported on 07/13/2015) 30 tablet 0 Not Taking at Unknown time    ROS  See HPI Above Physical Exam   Blood pressure 128/68, pulse 54, temperature 98.4 F (36.9 C), temperature source Oral, resp. rate 16, height 5\' 6"  (1.676 m), weight 99.451 kg (219 lb 4 oz), last menstrual period 03/15/2015.  Results for orders placed or performed during the hospital encounter of 07/13/15 (from the past 24 hour(s))  Urinalysis, Routine w reflex microscopic (not [facility])     Status: Abnormal   Collection Time: 07/13/15  1:30 PM  Result Value Ref Range   Color, Urine YELLOW YELLOW   APPearance CLOUDY (A) CLEAR   Specific Gravity, Urine 1.020 1.005 - 1.030   pH 6.0 5.0 - 8.0   Glucose, UA  NEGATIVE NEGATIVE mg/dL   Hgb urine dipstick NEGATIVE NEGATIVE   Bilirubin Urine NEGATIVE NEGATIVE   Ketones, ur >80 (A) NEGATIVE mg/dL   Protein, ur NEGATIVE NEGATIVE mg/dL   Nitrite NEGATIVE NEGATIVE   Leukocytes, UA NEGATIVE NEGATIVE    Physical Exam  Vitals reviewed. Constitutional: She is oriented to person, place, and time. She appears well-developed and well-nourished. She appears distressed.  Mild  HENT:  Head: Normocephalic and atraumatic.  Eyes: EOM are normal.  Neck: Normal range of motion.  Cardiovascular: Normal rate.   Respiratory: Effort normal.  GI: Soft.  Musculoskeletal: Normal range of motion.  Neurological: She is alert and oriented to person, place, and time.   Skin: Skin is warm and dry. There is pallor.     FHR: 150 by doppler UC: None ED Course  Assessment: IUP at 16.4wks Migraine NV H/O Seizures  Plan: -UA with +ketones, all other aspects WNL -Will give zofran  ODT -Push oral fluids, patient declines water or ginger ale stating it makes her more nauseous--give sprite -If oral fluids tolerated will give applesauce and ibuprofen -Reassess as appropriate -Dr. ND updated and agrees with plan  Follow Up (1604) -Patient reports feeling better s/p zofran, sprite,and applesauce -Give ibuprofen now -Rx for ibuprofen --Instructions on when to take and discontinuation at 28 wks -Rx for zofran 8 mg odt q8hrs -Keep appt as scheduled -Encouraged to call if any questions or concerns arise prior to next scheduled office visit.  -Discharged to home in improved condition  Cherre Robins CNM, MSN 07/13/2015 3:24 PM

## 2015-07-13 NOTE — MAU Note (Signed)
Patient presents with migraine x 3 to 4 days keeps vomiting, unable to keep anything down.

## 2015-07-13 NOTE — Discharge Instructions (Signed)

## 2015-07-24 ENCOUNTER — Other Ambulatory Visit (HOSPITAL_COMMUNITY): Payer: Self-pay | Admitting: Maternal and Fetal Medicine

## 2015-07-24 ENCOUNTER — Encounter (HOSPITAL_COMMUNITY): Payer: Self-pay

## 2015-07-24 ENCOUNTER — Ambulatory Visit (HOSPITAL_COMMUNITY)
Admission: RE | Admit: 2015-07-24 | Discharge: 2015-07-24 | Disposition: A | Discharge status: Home / Self Care | Payer: Medicaid Other | Source: Ambulatory Visit | Attending: Obstetrics & Gynecology | Admitting: Obstetrics & Gynecology

## 2015-07-24 DIAGNOSIS — Z36 Encounter for antenatal screening of mother: Secondary | ICD-10-CM | POA: Diagnosis not present

## 2015-07-24 DIAGNOSIS — Z3689 Encounter for other specified antenatal screening: Secondary | ICD-10-CM

## 2015-07-24 DIAGNOSIS — O99352 Diseases of the nervous system complicating pregnancy, second trimester: Principal | ICD-10-CM

## 2015-07-24 DIAGNOSIS — O99332 Smoking (tobacco) complicating pregnancy, second trimester: Secondary | ICD-10-CM | POA: Diagnosis not present

## 2015-07-24 DIAGNOSIS — Z3A18 18 weeks gestation of pregnancy: Secondary | ICD-10-CM | POA: Insufficient documentation

## 2015-07-24 DIAGNOSIS — O99212 Obesity complicating pregnancy, second trimester: Secondary | ICD-10-CM | POA: Diagnosis present

## 2015-07-24 DIAGNOSIS — G40909 Epilepsy, unspecified, not intractable, without status epilepticus: Secondary | ICD-10-CM

## 2015-07-26 NOTE — L&D Delivery Note (Addendum)
Delivery Note I arrived to find patient pushing and fetal head at +2 station.  Continued with guided pushing and with family support at bedside she proceeded to deliver.   At 10:13 AM a viable female named "Brooke DareKing" was delivered via Vaginal, Spontaneous Delivery (Presentation: Left Occiput Anterior).  APGAR: 9, 9; weight 7lbs 3 oz   Placenta status: Intact, Spontaneous Manual removal.  Cord: 3 vessels with the following complications: None.   Anesthesia: Epidural  Episiotomy: None Lacerations: 1st degree;Perineal;Periurethral left, labia minora right.  Suture Repair: 3.0 Est. Blood Loss (mL): 150  Mom to postpartum.  Baby to Couplet care / Skin to Skin. Plan is for outpatient circumcision. Plans to breast and bottle feed.   [facility],Nieves Barberi [facility] 12/27/2015, 10:45 AM

## 2015-09-17 ENCOUNTER — Encounter (HOSPITAL_COMMUNITY): Payer: Self-pay | Admitting: Emergency Medicine

## 2015-09-17 ENCOUNTER — Emergency Department (HOSPITAL_COMMUNITY)
Admission: EM | Admit: 2015-09-17 | Discharge: 2015-09-18 | Disposition: A | Discharge status: Home / Self Care | Payer: Medicaid Other | Attending: Emergency Medicine | Admitting: Emergency Medicine

## 2015-09-17 DIAGNOSIS — O99332 Smoking (tobacco) complicating pregnancy, second trimester: Secondary | ICD-10-CM | POA: Diagnosis not present

## 2015-09-17 DIAGNOSIS — F17210 Nicotine dependence, cigarettes, uncomplicated: Secondary | ICD-10-CM | POA: Diagnosis not present

## 2015-09-17 DIAGNOSIS — Z3A26 26 weeks gestation of pregnancy: Secondary | ICD-10-CM | POA: Diagnosis not present

## 2015-09-17 DIAGNOSIS — R569 Unspecified convulsions: Secondary | ICD-10-CM

## 2015-09-17 DIAGNOSIS — Z3490 Encounter for supervision of normal pregnancy, unspecified, unspecified trimester: Secondary | ICD-10-CM

## 2015-09-17 DIAGNOSIS — O99352 Diseases of the nervous system complicating pregnancy, second trimester: Secondary | ICD-10-CM | POA: Insufficient documentation

## 2015-09-17 DIAGNOSIS — G40909 Epilepsy, unspecified, not intractable, without status epilepticus: Secondary | ICD-10-CM | POA: Insufficient documentation

## 2015-09-17 MED ORDER — LACTATED RINGERS IV BOLUS (SEPSIS)
1000.0000 mL | Freq: Once | INTRAVENOUS | Status: AC
  Administered 2015-09-17: 1000 mL via INTRAVENOUS

## 2015-09-17 NOTE — ED Notes (Signed)
Pt comes to Ed via Ems, c/o seizure witnessed by family. Family states the seizure may have lasted at least 15 minutes. Pt has a hx of seizures and has been taking kepra 3x daily as directed. Pt is [redacted] weeks pregnant and rapid response OB nurse is at bedside. 22 in left hand placed by EMS. Vs on arrival are bp 120/80, sinus tac, rr 16, cbg 114, spo2 95. Seizure pads put in place. Assessment being conducted.

## 2015-09-17 NOTE — ED Notes (Signed)
Bed: ZO10 Expected date:  Expected time:  Means of arrival:  Comments: EMS seizure [redacted] weeks pregnant

## 2015-09-18 ENCOUNTER — Ambulatory Visit (HOSPITAL_COMMUNITY): Admission: RE | Admit: 2015-09-18 | Payer: Medicaid Other | Source: Ambulatory Visit

## 2015-09-18 LAB — LACTATE DEHYDROGENASE: LDH: 103 U/L (ref 98–192)

## 2015-09-18 LAB — COMPREHENSIVE METABOLIC PANEL
ALBUMIN: 3.1 g/dL — AB (ref 3.5–5.0)
ALT: 17 U/L (ref 14–54)
AST: 16 U/L (ref 15–41)
Alkaline Phosphatase: 87 U/L (ref 38–126)
Anion gap: 9 (ref 5–15)
BUN: <5 mg/dL — ABNORMAL LOW (ref 6–20)
CHLORIDE: 110 mmol/L (ref 101–111)
CO2: 21 mmol/L — AB (ref 22–32)
CREATININE: 0.47 mg/dL (ref 0.44–1.00)
Calcium: 8.9 mg/dL (ref 8.9–10.3)
GFR calc Af Amer: >60 mL/min (ref >60)
Glucose, Bld: 89 mg/dL (ref 65–99)
Potassium: 3.7 mmol/L (ref 3.5–5.1)
Sodium: 140 mmol/L (ref 135–145)
Total Bilirubin: 0.3 mg/dL (ref 0.3–1.2)
Total Protein: 6.5 g/dL (ref 6.5–8.1)

## 2015-09-18 LAB — CBC WITH DIFFERENTIAL/PLATELET
BASOS ABS: 0 10*3/uL (ref 0.0–0.1)
Basophils Relative: 0 %
EOS ABS: 0.1 10*3/uL (ref 0.0–0.7)
EOS PCT: 0 %
HCT: 32.1 % — ABNORMAL LOW (ref 36.0–46.0)
Hemoglobin: 10.8 g/dL — ABNORMAL LOW (ref 12.0–15.0)
Lymphocytes Relative: 16 %
Lymphs Abs: 2.2 10*3/uL (ref 0.7–4.0)
MCH: 31.6 pg (ref 26.0–34.0)
MCHC: 33.6 g/dL (ref 30.0–36.0)
MCV: 93.9 fL (ref 78.0–100.0)
Monocytes Absolute: 0.7 10*3/uL (ref 0.1–1.0)
Monocytes Relative: 5 %
Neutro Abs: 11.3 10*3/uL — ABNORMAL HIGH (ref 1.7–7.7)
Neutrophils Relative %: 79 %
PLATELETS: 279 10*3/uL (ref 150–400)
RBC: 3.42 MIL/uL — AB (ref 3.87–5.11)
RDW: 12.9 % (ref 11.5–15.5)
WBC: 14.4 10*3/uL — AB (ref 4.0–10.5)

## 2015-09-18 LAB — PROTEIN / CREATININE RATIO, URINE
Creatinine, Urine: 152.31 mg/dL
PROTEIN CREATININE RATIO: 0.11 mg/mg{creat} (ref 0.00–0.15)
TOTAL PROTEIN, URINE: 17 mg/dL

## 2015-09-18 LAB — URIC ACID: URIC ACID, SERUM: 3.9 mg/dL (ref 2.3–6.6)

## 2015-09-18 NOTE — Discharge Instructions (Signed)

## 2015-09-18 NOTE — Progress Notes (Signed)
Pt is a G5P0, at 26wks; being seen at [facility] after having a seizure at home. Pt has a history of seizures and has been on Kepra 3times per day throughout pregnancy. No leaking fluid or vaginal bleeding. Pt states she has not had a seizure in over 1 year. initial fetal heart tracing 155bpm/minimal variability, no accelerations and no decelerations. Dr. Sallye Ober notified of patient at [facility], fht, no UC's. Requested IV fluids and continued monitoring until moderate variability on fetal tracing. Fetal monitoring resumed for another 45 minutes. Fetal heart tones 135bpm with moderate variability and several 10x10 accelerations (monitor tracing was intermitent and monitor hand held, with good fetal movement noted) and no decelerations. No contractions noted.

## 2015-09-18 NOTE — ED Provider Notes (Signed)
CSN: [id number]     Arrival date & time 09/17/15  2154 History   First MD Initiated Contact with Patient 09/17/15 2239     Chief Complaint  Patient presents with  . Seizures  . [redacted] weeks pregnant     (Consider location/radiation/quality/duration/timing/severity/associated sxs/prior Treatment) Patient is a 25 y.o. female presenting with seizures. The history is provided by the patient. No language interpreter was used.  Seizures Seizure activity on arrival: no   Seizure type:  Grand mal Preceding symptoms: no headache   Initial focality:  None Episode characteristics: abnormal movements   Return to baseline: yes   Severity:  Moderate Timing:  Once Context: decreased sleep   Context: not alcohol withdrawal   Recent head injury:  No recent head injuries PTA treatment:  None History of seizures: yes   Similar to previous episodes: yes   Pt is [redacted] weeks pregnant. Pt has a history of seizure disorder.  Pt is on keppra.  Pt feels normal now.    Past Medical History  Diagnosis Date  . Seizures (HCC)     Last seizure 5 months ago  . Abortion history   . JXBJYNWG(956.2)    Past Surgical History  Procedure Laterality Date  . Dilation and curettage of uterus      abortion   Family History  Problem Relation Age of Onset  . Asthma Brother   . Diabetes Mother    Social History  Substance Use Topics  . Smoking status: Current Every Day Smoker -- 0.50 packs/day for 5 years    Types: Cigarettes  . Smokeless tobacco: Never Used  . Alcohol Use: Yes     Comment: occasional   OB History    Gravida Para Term Preterm AB TAB SAB Ectopic Multiple Living       Review of Systems  Neurological: Positive for seizures.  All other systems reviewed and are negative.     Allergies  Review of patient's allergies indicates no known allergies.  Home Medications   Prior to Admission medications   Medication Sig Start Date End Date Taking? Authorizing Provider   acetaminophen (TYLENOL) 500 MG tablet Take 1,000 mg by mouth every 6 (six) hours as needed for headache.   Yes Historical Provider, MD  ibuprofen (ADVIL,MOTRIN) 200 MG tablet Take 400 mg by mouth every 6 (six) hours as needed for headache.   Yes Historical Provider, MD  levETIRAcetam (KEPPRA) 500 MG tablet One tab in morning and two tabs at bedtime. 02/19/15  Yes Levert Feinstein, MD  ibuprofen (ADVIL,MOTRIN) 600 MG tablet Take 1 tablet (600 mg total) by mouth every 6 (six) hours as needed for headache. Do not use past 28 weeks or 7 months of pregnancy. Patient not taking: Reported on 09/17/2015 07/13/15   Gerrit Heck, CNM  ondansetron (ZOFRAN ODT) 8 MG disintegrating tablet Take 1 tablet (8 mg total) by mouth every 8 (eight) hours as needed for nausea or vomiting. Patient not taking: Reported on 07/24/2015 07/13/15   Gerrit Heck, CNM   BP 105/63 mmHg  Pulse 73  Resp 16  SpO2 98%  LMP 03/15/2015 Physical Exam  Constitutional: She appears well-developed and well-nourished.  HENT:  Head: Normocephalic.  Right Ear: External ear normal.  Left Ear: External ear normal.  Mouth/Throat: Oropharynx is clear and moist.  Eyes: Conjunctivae are normal. Pupils are equal, round, and reactive to light.  Neck: Normal range of motion. Neck supple.  Cardiovascular: Normal  rate and normal heart sounds.   Pulmonary/Chest: Effort normal and breath sounds normal.  Abdominal: Soft. Bowel sounds are normal.  Musculoskeletal: Normal range of motion.  Neurological: She is alert.  Skin: Skin is warm.  Nursing note and vitals reviewed.   ED Course  Procedures (including critical care time) Labs Review Labs Reviewed  CBC WITH DIFFERENTIAL/PLATELET - Abnormal; Notable for the following:    WBC 14.4 (*)    RBC 3.42 (*)    Hemoglobin 10.8 (*)    HCT 32.1 (*)    Neutro Abs 11.3 (*)    All other components within normal limits  COMPREHENSIVE METABOLIC PANEL - Abnormal; Notable for the following:    CO2 21 (*)     BUN <5 (*)    Albumin 3.1 (*)    All other components within normal limits  URIC ACID  LACTATE DEHYDROGENASE  PROTEIN / CREATININE RATIO, URINE    Imaging Review No results found. I have personally reviewed and evaluated these images and lab results as part of my medical decision-making.   EKG Interpretation None      MDM Rapid response OB  here to see patient.   Pt given Iv fluids x 1 liter lr,  OB/Gyn Dr. Milinda Pointer contacted and advised pt needs to follow up in 1 week.     Final diagnoses:  Seizure [facility])  Pregnancy    An After Visit Summary was printed and given to the patient.    Lonia Skinner [place], PA-C 09/18/15 1610  Raeford Razor, MD 09/18/15 [phone]

## 2015-10-09 DIAGNOSIS — Z0289 Encounter for other administrative examinations: Secondary | ICD-10-CM

## 2015-10-30 ENCOUNTER — Inpatient Hospital Stay (HOSPITAL_COMMUNITY)
Admission: AD | Admit: 2015-10-30 | Discharge: 2015-10-30 | Disposition: A | Discharge status: Home / Self Care | Payer: Medicaid Other | Source: Ambulatory Visit | Attending: Obstetrics and Gynecology | Admitting: Obstetrics and Gynecology

## 2015-10-30 ENCOUNTER — Encounter (HOSPITAL_COMMUNITY): Payer: Self-pay | Admitting: *Deleted

## 2015-10-30 DIAGNOSIS — Z3A32 32 weeks gestation of pregnancy: Secondary | ICD-10-CM | POA: Diagnosis not present

## 2015-10-30 DIAGNOSIS — F17210 Nicotine dependence, cigarettes, uncomplicated: Secondary | ICD-10-CM | POA: Diagnosis not present

## 2015-10-30 DIAGNOSIS — O26893 Other specified pregnancy related conditions, third trimester: Secondary | ICD-10-CM | POA: Diagnosis not present

## 2015-10-30 DIAGNOSIS — H9202 Otalgia, left ear: Secondary | ICD-10-CM | POA: Diagnosis not present

## 2015-10-30 DIAGNOSIS — O2620 Pregnancy care for patient with recurrent pregnancy loss, unspecified trimester: Secondary | ICD-10-CM

## 2015-10-30 DIAGNOSIS — R569 Unspecified convulsions: Secondary | ICD-10-CM | POA: Insufficient documentation

## 2015-10-30 DIAGNOSIS — G40909 Epilepsy, unspecified, not intractable, without status epilepticus: Secondary | ICD-10-CM

## 2015-10-30 DIAGNOSIS — Z79899 Other long term (current) drug therapy: Secondary | ICD-10-CM | POA: Insufficient documentation

## 2015-10-30 DIAGNOSIS — O0992 Supervision of high risk pregnancy, unspecified, second trimester: Secondary | ICD-10-CM

## 2015-10-30 DIAGNOSIS — O99352 Diseases of the nervous system complicating pregnancy, second trimester: Secondary | ICD-10-CM

## 2015-10-30 MED ORDER — AZITHROMYCIN 250 MG PO TABS: ORAL_TABLET | ORAL | Status: DC

## 2015-10-30 MED ORDER — AZITHROMYCIN 1 G PO PACK: 1.0000 g | PACK | Freq: Once | ORAL | Status: DC

## 2015-10-30 NOTE — MAU Note (Signed)
Right ear ache for one week. Had respiratory infection tested for flu was negative.

## 2015-10-30 NOTE — MAU Note (Signed)
Urine sent to lab 

## 2015-10-30 NOTE — MAU Provider Note (Signed)
Brittany Hendrix is a 25 y.o. G5P0040 at 32.1 weeks c/o left ear pain x 1 week.  Pt report she seen Dr Stefano GaulStringer told her to come to MAU for eval if s/s did not get better.    Pt seen in the office last week with Flu Like Symptoms Coughing, Ha's, Chills, Hot flashes, Dizzy spells, Sore throat, cramping x 3days Pt taking Tylenol PRN and Robitussin Cough and Cold PRN Pt denies MAU visit CM/CMA HEENT: clear. Chest: clear. Heart: RRR. FLU A & B Negative  History     Patient Active Problem List   Diagnosis Date Noted  . Seizure disorder in pregnancy, antepartum (HCC) 05/28/2015  . Supervision of high-risk pregnancy 05/28/2015  . 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 Seizures (HCC) 12/23/2012    Chief Complaint  Patient presents with  . Otalgia   HPI  OB History    Gravida Para Term Preterm AB TAB SAB Ectopic Multiple Living   5 0 0 0 4 1 3 0 0 0       Past Medical History  Diagnosis Date  . Abortion history   . Headache(784.0)   . Seizures [facility](HCC)     Last seizure March 2017    Past Surgical History  Procedure Laterality Date  . Dilation and curettage of uterus      abortion    Family History  Problem Relation Age of Onset  . Asthma Brother   . Diabetes Mother     Social History  Substance Use Topics  . Smoking status: Current Every Day Smoker -- 0.50 packs/day for 5 years    Types: Cigarettes  . Smokeless tobacco: Never Used  . Alcohol Use: Yes     Comment: occasional    Allergies: No Known Allergies  Prescriptions prior to admission  Medication Sig Dispense Refill Last Dose  . acetaminophen (TYLENOL) 500 MG tablet Take 1,000 mg by mouth every 6 (six) hours as needed for headache.   11/17/2015 at Unknown time  . levETIRAcetam (KEPPRA) 500 MG tablet One tab in morning and two tabs at bedtime. (Patient taking differently: Take 500 mg by mouth 2 (two) times daily. ) 90 tablet 11 11/17/2015 at Unknown time  . Prenatal Vit-Fe  Fumarate-FA (PRENATAL MULTIVITAMIN) TABS tablet Take 1 tablet by mouth daily at 12 noon.   11/17/2015 at Unknown time  . ondansetron (ZOFRAN ODT) 8 MG disintegrating tablet Take 1 tablet (8 mg total) by mouth every 8 (eight) hours as needed for nausea or vomiting. (Patient not taking: Reported on 07/24/2015) 20 tablet 0 Not Taking at Unknown time    ROS See HPI above, all other systems are negative  Physical Exam   Blood pressure 110/67, pulse 112, temperature 97.7 F (36.5 C), temperature source Oral, resp. rate 18, height 5\' 4"  (1.626 m), weight 217 lb 6.4 oz (98.612 kg), last menstrual period 03/15/2015.  Physical Exam Ext:  WNL ABD: Soft, non tender to palpation, no rebound or guarding SVE: deferred ENT exam: redness in right ear non in left, no draining noted.  no nasal redness, back of throat sightly red.     ED Course  Assessment: IUP at  32.1 weeks Membranes: intact FHR: Category 1 CTX:  none  MAU Addendum Note  No results found for this or any previous visit (from the past 24 hour(s)).   Plan: -Rx for z pack -Discussed need to follow up in office  -Bleeding and PTL Precautions -Encouraged to call if any  questions or concerns arise prior to next scheduled office visit.  -Discharged to home in stable condition    Katalyna Socarras, CNM, MSN 10/30/23 12:42 PM

## 2015-10-30 NOTE — Discharge Instructions (Signed)

## 2015-11-19 ENCOUNTER — Ambulatory Visit (INDEPENDENT_AMBULATORY_CARE_PROVIDER_SITE_OTHER): Payer: Medicaid Other | Admitting: Neurology

## 2015-11-19 ENCOUNTER — Encounter: Payer: Self-pay | Admitting: Neurology

## 2015-11-19 VITALS — BP 103/72 | HR 100 | Ht 64.0 in | Wt 218.0 lb

## 2015-11-19 DIAGNOSIS — R569 Unspecified convulsions: Secondary | ICD-10-CM

## 2015-11-19 MED ORDER — LEVETIRACETAM 1000 MG PO TABS: 1000.0000 mg | ORAL_TABLET | Freq: Two times a day (BID) | ORAL | Status: DC

## 2015-11-19 NOTE — Progress Notes (Signed)
Chief Complaint  Patient presents with  . Seizures    She is currently pregnant with her first child.  Her expected due date is 12/24/15.  She has had breakthrough seizures with the most recent being in March 2017.  She is still taking Keppra  in am and  in pm.  Denies missing any doses of her medication.      PATIENT: Brittany Hendrix DOB: 06/01/1991  Chief Complaint  Patient presents with  . Seizures    She is currently pregnant with her first child.  Her expected due date is 12/24/15.  She has had breakthrough seizures with the most recent being in March 2017.  She is still taking Keppra  in am and  in pm.  Denies missing any doses of her medication.    HISTORICAL  Brittany Hendrix is a 25 years old female, with epilepsy disorder, currently taking Keppra 500 in the morning, 1000 mg in the evening. Last seizure was in May 2014.   She had seizures since she was 50-36 years old.  All seizure has similar seminology. She has an aura before those seizures, she feels drowsy and sometimes nauseated, lasting few minutes, followed by loss of conciousness, whole body shaking, then post ictal confusion usually last 20-30 minutes..   She has injured herself in the face and on her leg  during seizures in the past. She was never on any anti-epilepsy medications, until after she had a recurrent seizure in May 2012, she was put on Levetiracetam 500 mg b.i.d.   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  her bloodwork tested positive for marijuana.   For a while, she was noncompliant with her medications, also concerned about the medication costs, she continue have recurrent seizures.   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  I reviewed laboratory, normal CMP, mild anemia 10.8,  REVIEW OF SYSTEMS: Full 14 system review of systems performed and notable only for seizure  ALLERGIES: No Known Allergies  HOME MEDICATIONS: Current Outpatient Prescriptions  Medication Sig Dispense Refill  . acetaminophen (TYLENOL) 500 MG tablet Take 1,000 mg by mouth every 6 (six) hours as needed for headache.    . levETIRAcetam (KEPPRA) 500 MG tablet One tab in morning and two tabs at bedtime. (Patient taking differently: Take 500 mg by mouth. Taking one in am and two in pm.) 90 tablet 11  . Prenatal Vit-Fe Fumarate-FA (PRENATAL  MULTIVITAMIN) TABS tablet Take 1 tablet by mouth daily at 12 noon.     No current facility-administered medications for this visit.    PAST MEDICAL HISTORY: Past Medical History  Diagnosis Date  . Abortion history   . Headache(784.0)   . Seizures [facility](HCC)     Last seizure March 2017    PAST SURGICAL HISTORY: Past Surgical History  Procedure Laterality Date  . Dilation and curettage of uterus      abortion    FAMILY HISTORY: Family History  Problem Relation Age of Onset  . Asthma Brother   . Diabetes Mother     SOCIAL HISTORY:  Social History    Social History  . Marital Status: Single    Spouse Name: N/A  . Number of Children: 0  . Years of Education: 12   Occupational History  . CASHIER     [employer]   Social History Main Topics  . Smoking status: Current Every Day Smoker -- 0.50 packs/day for 5 years    Types: Cigarettes  . Smokeless tobacco: Never Used  . Alcohol Use: Yes     Comment: occasional  . Drug Use: Yes    Special: Marijuana     Comment: None since + UPT  . Sexual Activity: Yes    Birth Control/ Protection: None   Other Topics Concern  . Not on file   Social History Narrative   Patient is single and lives at home alone. Patient works at [employer]. Right handed. Caffeine three daily.     PHYSICAL EXAM   Filed Vitals:   11/19/15 0902  BP: 103/72  Pulse: 100  Height: 5\' 4"  (1.626 m)  Weight: 218 lb (98.884 kg)    Not recorded      Body mass index is 37.4 kg/(m^2).  PHYSICAL EXAMNIATION:  Gen: NAD, conversant, well nourised, obese, well groomed                     Cardiovascular: Regular rate rhythm, no peripheral edema, warm, nontender. Eyes: Conjunctivae clear without exudates or hemorrhage Neck: Supple, no carotid bruise. Pulmonary: Clear to auscultation bilaterally   NEUROLOGICAL EXAM:  MENTAL STATUS: Speech:    Speech is normal; fluent and spontaneous with normal comprehension.  Cognition:    The patient is oriented to person, place, and time;     recent and remote memory intact;     language fluent;     normal attention, concentration,     fund of knowledge.  CRANIAL NERVES: CN II: Visual fields are full to confrontation. Fundoscopic exam is normal with sharp discs and no vascular changes. Pupil equal round reactive to light CN III, IV, VI: extraocular movement are normal. No ptosis. CN V: Facial sensation is intact to pinprick in all 3 divisions bilaterally. Corneal responses are intact.  CN VII: Face is symmetric with normal eye closure and smile. CN VIII: Hearing  is normal to rubbing fingers CN IX, X: Palate elevates symmetrically. Phonation is normal. CN XI: Head turning and shoulder shrug are intact CN XII: Tongue is midline with normal movements and no atrophy.  MOTOR: There is no pronator drift of out-stretched arms. Muscle bulk and tone are normal. Muscle strength is normal.  REFLEXES: Reflexes are 2+ and symmetric at the biceps, triceps, knees, and ankles. Plantar responses are flexor.  SENSORY: Light touch, pinprick, position sense, and vibration sense are intact in fingers and toes.  COORDINATION: Rapid alternating movements and fine finger movements are  intact. There is no dysmetria on finger-to-nose and heel-knee-shin.   GAIT/STANCE: Posture is normal. Gait is steady with normal steps, base, arm swing, and turning. Heel and toe walking are normal. Tandem gait is normal.  Romberg is absent.   DIAGNOSTIC DATA (LABS, IMAGING, TESTING) - I reviewed patient records, labs, notes, testing and imaging myself where available.  Lab Results  Component Value Date   WBC 14.4* 09/17/2015   HGB 10.8* 09/17/2015   HCT 32.1* 09/17/2015   MCV 93.9 09/17/2015   PLT 279 09/17/2015      Component Value Date/Time   NA 140 09/17/2015 2323   K 3.7 09/17/2015 2323   CL 110 09/17/2015 2323   CO2 21* 09/17/2015 2323   GLUCOSE 89 09/17/2015 2323   BUN <5* 09/17/2015 2323   CREATININE 0.47 09/17/2015 2323   CALCIUM 8.9 09/17/2015 2323   PROT 6.5 09/17/2015 2323   ALBUMIN 3.1* 09/17/2015 2323   AST 16 09/17/2015 2323   ALT 17 09/17/2015 2323   ALKPHOS 87 09/17/2015 2323   BILITOT 0.3 09/17/2015 2323   GFRNONAA >60 09/17/2015 2323   GFRAA >60 09/17/2015 2323     ASSESSMENT AND PLAN  Lavena Loretto [place] is a 25 y.o. 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 Ph.D.  [facility] [address] [place], [place] [address] Ph: [phone] Fax: [phone]

## 2015-11-25 LAB — OB RESULTS CONSOLE GC/CHLAMYDIA
CHLAMYDIA, DNA PROBE: NEGATIVE
Gonorrhea: NEGATIVE

## 2015-11-25 LAB — OB RESULTS CONSOLE GBS: STREP GROUP B AG: NEGATIVE

## 2015-12-07 ENCOUNTER — Inpatient Hospital Stay (HOSPITAL_COMMUNITY)
Admission: AD | Admit: 2015-12-07 | Discharge: 2015-12-07 | Disposition: A | Discharge status: Home / Self Care | Payer: Medicaid Other | Source: Ambulatory Visit | Attending: Obstetrics & Gynecology | Admitting: Obstetrics & Gynecology

## 2015-12-07 ENCOUNTER — Encounter (HOSPITAL_COMMUNITY): Payer: Self-pay | Admitting: *Deleted

## 2015-12-07 DIAGNOSIS — Z3A38 38 weeks gestation of pregnancy: Secondary | ICD-10-CM | POA: Insufficient documentation

## 2015-12-07 DIAGNOSIS — Z87891 Personal history of nicotine dependence: Secondary | ICD-10-CM | POA: Insufficient documentation

## 2015-12-07 DIAGNOSIS — G40909 Epilepsy, unspecified, not intractable, without status epilepticus: Secondary | ICD-10-CM | POA: Insufficient documentation

## 2015-12-07 DIAGNOSIS — O99353 Diseases of the nervous system complicating pregnancy, third trimester: Secondary | ICD-10-CM | POA: Insufficient documentation

## 2015-12-07 DIAGNOSIS — O2623 Pregnancy care for patient with recurrent pregnancy loss, third trimester: Secondary | ICD-10-CM | POA: Insufficient documentation

## 2015-12-07 DIAGNOSIS — R102 Pelvic and perineal pain: Secondary | ICD-10-CM | POA: Diagnosis present

## 2015-12-07 NOTE — Discharge Instructions (Signed)
Braxton Hicks Contractions °Contractions of the uterus can occur throughout pregnancy. Contractions are not always a sign that you are in labor.  °WHAT ARE BRAXTON HICKS CONTRACTIONS?  °Contractions that occur before labor are called Braxton Hicks contractions, or false labor. Toward the end of pregnancy (32-34 weeks), these contractions can develop more often and may become more forceful. This is not true labor because these contractions do not result in opening (dilatation) and thinning of the cervix. They are sometimes difficult to tell apart from true labor because these contractions can be forceful and people have different pain tolerances. You should not feel embarrassed if you go to the hospital with false labor. Sometimes, the only way to tell if you are in true labor is for your health care provider to look for changes in the cervix. °If there are no prenatal problems or other health problems associated with the pregnancy, it is completely safe to be sent home with false labor and await the onset of true labor. °HOW CAN YOU TELL THE DIFFERENCE BETWEEN TRUE AND FALSE LABOR? °False Labor °· The contractions of false labor are usually shorter and not as hard as those of true labor.   °· The contractions are usually irregular.   °· The contractions are often felt in the front of the lower abdomen and in the groin.   °· The contractions may go away when you walk around or change positions while lying down.   °· The contractions get weaker and are shorter lasting as time goes on.   °· The contractions do not usually become progressively stronger, regular, and closer together as with true labor.   °True Labor °· Contractions in true labor last 30-70 seconds, become very regular, usually become more intense, and increase in frequency.   °· The contractions do not go away with walking.   °· The discomfort is usually felt in the top of the uterus and spreads to the lower abdomen and low back.   °· True labor can be  determined by your health care provider with an exam. This will show that the cervix is dilating and getting thinner.   °WHAT TO REMEMBER °· Keep up with your usual exercises and follow other instructions given by your health care provider.   °· Take medicines as directed by your health care provider.   °· Keep your regular prenatal appointments.   °· Eat and drink lightly if you think you are going into labor.   °· If Braxton Hicks contractions are making you uncomfortable:   °¨ Change your position from lying down or resting to walking, or from walking to resting.   °¨ Sit and rest in a tub of warm water.   °¨ Drink 2-3 glasses of water. Dehydration may cause these contractions.   °¨ Do slow and deep breathing several times an hour.   °WHEN SHOULD I SEEK IMMEDIATE MEDICAL CARE? °Seek immediate medical care if: °· Your contractions become stronger, more regular, and closer together.   °· You have fluid leaking or gushing from your vagina.   °· You have a fever.   °· You pass blood-tinged mucus.   °· You have vaginal bleeding.   °· You have continuous abdominal pain.   °· You have low back pain that you never had before.   °· You feel your baby's head pushing down and causing pelvic pressure.   °· Your baby is not moving as much as it used to.   °  °This information is not intended to replace advice given to you by your health care provider. Make sure you discuss any questions you have with your health care   provider. °  °Document Released: 07/11/2005 Document Revised: 07/16/2013 Document Reviewed: 04/22/2013 °Elsevier Interactive Patient Education ©2016 [employer] Inc[employer] ° °

## 2015-12-07 NOTE — MAU Provider Note (Signed)
  History   25 yo G4P0030 at 38 weeks presented after calling with pelvic pressure, denies vaginal bleeding or leaking, reports +FM.   Patient Active Problem List   Diagnosis Date Noted  . Seizure disorder in pregnancy, antepartum (HCC) 05/28/2015  . Supervision of high-risk pregnancy 05/28/2015  . 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 Seizures (HCC) 12/23/2012    Chief Complaint  Patient presents with  . Abdominal Cramping   HPI:  As above  OB History    Gravida Para Term Preterm AB TAB SAB Ectopic Multiple Living   5 0 0 0 4 1 3 0 0 0       Past Medical History  Diagnosis Date  . Abortion history   . Headache(784.0)   . Seizures [facility](HCC)     Last seizure March 2017  On Keppra   Past Surgical History  Procedure Laterality Date  . Dilation and curettage of uterus      abortion    Family History  Problem Relation Age of Onset  . Asthma Brother   . Diabetes Mother     Social History  Substance Use Topics  . Smoking status: Former Smoker -- 0.50 packs/day for 5 years    Types: Cigarettes    Quit date: 08/09/2015  . Smokeless tobacco: Never Used  . Alcohol Use: Yes     Comment: occasional, not while preg    Allergies: No Known Allergies  Prescriptions prior to admission  Medication Sig Dispense Refill Last Dose  . levETIRAcetam (KEPPRA) 1000 MG tablet Take 1 tablet (1,000 mg total) by mouth 2 (two) times daily. 60 tablet 11 12/07/2015 at Unknown time  . Prenatal Vit-Fe Fumarate-FA (PRENATAL MULTIVITAMIN) TABS tablet Take 1 tablet by mouth daily at 12 noon.   Past Week at Unknown time  . acetaminophen (TYLENOL) 500 MG tablet Take 1,000 mg by mouth every 6 (six) hours as needed for headache.   More than a month at Unknown time    ROS:  Pelvic pressure, mild cramping, +FM Physical Exam   Blood pressure 102/71, pulse 114, temperature 97.4 F (36.3 C), temperature source Oral, resp. rate 16, height 5\' 4"  (1.626 m), weight  98.884 kg (218 lb), last menstrual period 03/16/2015.    Physical Exam  In NAD Chest clear Heart RRR without murmur Abd gravid, NT Pelvic--closed, 60%, vtx, -2 by RN exam Ext WNL  FHR Category 1 UCs none, occasional mild irritability  ED Course  Assessment: IUP at 38 weeks No evidence labor Seizure disorder--on Keppra, no seizures since 08/2015  Plan: [facility] call prn.    Nigel BridgemanLATHAM, Sy Saintjean CNM, MSN 12/07/2015 9:33 PM

## 2015-12-22 ENCOUNTER — Telehealth (HOSPITAL_COMMUNITY): Payer: Self-pay | Admitting: *Deleted

## 2015-12-22 ENCOUNTER — Encounter (HOSPITAL_COMMUNITY): Payer: Self-pay | Admitting: *Deleted

## 2015-12-22 NOTE — Telephone Encounter (Signed)
Preadmission screen  

## 2015-12-23 ENCOUNTER — Inpatient Hospital Stay (HOSPITAL_COMMUNITY)
Admission: AD | Admit: 2015-12-23 | Discharge: 2015-12-23 | Disposition: A | Discharge status: Home / Self Care | Payer: Medicaid Other | Source: Ambulatory Visit | Attending: Obstetrics & Gynecology | Admitting: Obstetrics & Gynecology

## 2015-12-23 ENCOUNTER — Encounter (HOSPITAL_COMMUNITY): Payer: Self-pay | Admitting: *Deleted

## 2015-12-23 DIAGNOSIS — Z87891 Personal history of nicotine dependence: Secondary | ICD-10-CM | POA: Diagnosis not present

## 2015-12-23 DIAGNOSIS — O99353 Diseases of the nervous system complicating pregnancy, third trimester: Secondary | ICD-10-CM | POA: Insufficient documentation

## 2015-12-23 DIAGNOSIS — Z3A40 40 weeks gestation of pregnancy: Secondary | ICD-10-CM | POA: Diagnosis not present

## 2015-12-23 DIAGNOSIS — G40909 Epilepsy, unspecified, not intractable, without status epilepticus: Secondary | ICD-10-CM | POA: Diagnosis not present

## 2015-12-23 DIAGNOSIS — O479 False labor, unspecified: Secondary | ICD-10-CM

## 2015-12-23 DIAGNOSIS — O471 False labor at or after 37 completed weeks of gestation: Secondary | ICD-10-CM | POA: Insufficient documentation

## 2015-12-23 DIAGNOSIS — O2620 Pregnancy care for patient with recurrent pregnancy loss, unspecified trimester: Secondary | ICD-10-CM

## 2015-12-23 NOTE — Discharge Instructions (Signed)
Braxton Hicks Contractions °Contractions of the uterus can occur throughout pregnancy. Contractions are not always a sign that you are in labor.  °WHAT ARE BRAXTON HICKS CONTRACTIONS?  °Contractions that occur before labor are called Braxton Hicks contractions, or false labor. Toward the end of pregnancy (32-34 weeks), these contractions can develop more often and may become more forceful. This is not true labor because these contractions do not result in opening (dilatation) and thinning of the cervix. They are sometimes difficult to tell apart from true labor because these contractions can be forceful and people have different pain tolerances. You should not feel embarrassed if you go to the hospital with false labor. Sometimes, the only way to tell if you are in true labor is for your health care provider to look for changes in the cervix. °If there are no prenatal problems or other health problems associated with the pregnancy, it is completely safe to be sent home with false labor and await the onset of true labor. °HOW CAN YOU TELL THE DIFFERENCE BETWEEN TRUE AND FALSE LABOR? °False Labor °· The contractions of false labor are usually shorter and not as hard as those of true labor.   °· The contractions are usually irregular.   °· The contractions are often felt in the front of the lower abdomen and in the groin.   °· The contractions may go away when you walk around or change positions while lying down.   °· The contractions get weaker and are shorter lasting as time goes on.   °· The contractions do not usually become progressively stronger, regular, and closer together as with true labor.   °True Labor °· Contractions in true labor last 30-70 seconds, become very regular, usually become more intense, and increase in frequency.   °· The contractions do not go away with walking.   °· The discomfort is usually felt in the top of the uterus and spreads to the lower abdomen and low back.   °· True labor can be  determined by your health care provider with an exam. This will show that the cervix is dilating and getting thinner.   °WHAT TO REMEMBER °· Keep up with your usual exercises and follow other instructions given by your health care provider.   °· Take medicines as directed by your health care provider.   °· Keep your regular prenatal appointments.   °· Eat and drink lightly if you think you are going into labor.   °· If Braxton Hicks contractions are making you uncomfortable:   °¨ Change your position from lying down or resting to walking, or from walking to resting.   °¨ Sit and rest in a tub of warm water.   °¨ Drink 2-3 glasses of water. Dehydration may cause these contractions.   °¨ Do slow and deep breathing several times an hour.   °WHEN SHOULD I SEEK IMMEDIATE MEDICAL CARE? °Seek immediate medical care if: °· Your contractions become stronger, more regular, and closer together.   °· You have fluid leaking or gushing from your vagina.   °· You have a fever.   °· You pass blood-tinged mucus.   °· You have vaginal bleeding.   °· You have continuous abdominal pain.   °· You have low back pain that you never had before.   °· You feel your baby's head pushing down and causing pelvic pressure.   °· Your baby is not moving as much as it used to.   °  °This information is not intended to replace advice given to you by your health care provider. Make sure you discuss any questions you have with your health care   provider. °  °Document Released: 07/11/2005 Document Revised: 07/16/2013 Document Reviewed: 04/22/2013 °Elsevier Interactive Patient Education ©2016 [employer] Inc[employer] ° °

## 2015-12-23 NOTE — MAU Provider Note (Signed)
  History  Brittany Hendrix is a 25 yo G5P0040 @ 40.2 wks who presents unannounced to MAU w/ c/o regular ctxs x 1 day; subsided once settled in MAU bed. +FM. Denies LOF or VB.  Patient Active Problem List   Diagnosis Date Noted  . False labor after 37 weeks of gestation without delivery 12/23/2015  . Seizure disorder in pregnancy, antepartum (HCC) 05/28/2015  . Supervision of high-risk pregnancy 05/28/2015  . 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  HPI  As above  OB History    Gravida Para Term Preterm AB TAB SAB Ectopic Multiple Living   5 0 0 0 4 1 3 0 0 0       Past Medical History  Diagnosis Date  . Abortion history   . Headache(784.0)   . Seizures [facility](HCC)     Last seizure March 2017    Past Surgical History  Procedure Laterality Date  . Dilation and curettage of uterus      abortion    Family History  Problem Relation Age of Onset  . Asthma Brother   . Diabetes Mother     Social History  Substance Use Topics  . Smoking status: Former Smoker -- 0.50 packs/day for 5 years    Types: Cigarettes    Quit date: 08/09/2015  . Smokeless tobacco: Never Used  . Alcohol Use: Yes     Comment: occasional, not while preg    Allergies: No Known Allergies  No prescriptions prior to admission    ROS  As per HPI Physical Exam   Blood pressure 105/69, pulse 106, temperature 98.2 F (36.8 C), temperature source Oral, resp. rate 16, height 5\' 4"  (1.626 m), weight 100.699 kg (222 lb), last menstrual period 03/16/2015, SpO2 99 %.    Physical Exam  Gen: NAD. Neuro: Grossly intact w/o focal deficits. Abdomen: soft, NT, no rebound or guarding. Pelvic: FT/thick/high.  Ext: WNL. FHRT: BL 135 w/ moderate variability, +accels, no decels Toco: Rare ctxs     ED Course  Assessment: False labor Cat 1 FHRT  Plan: D/C home w/ strict labor precautions. Continue FKCs. Keep OB appt  today.    Sherre ScarletWILLIAMS, Autry Droege CNM, MS 12/23/2015 2:37 AM

## 2015-12-23 NOTE — MAU Note (Signed)
Pt reports worsening contractions since yesterday. Denies bleeding or ROM

## 2015-12-25 ENCOUNTER — Other Ambulatory Visit: Payer: Self-pay | Admitting: Obstetrics and Gynecology

## 2015-12-25 DIAGNOSIS — O99019 Anemia complicating pregnancy, unspecified trimester: Secondary | ICD-10-CM | POA: Insufficient documentation

## 2015-12-25 DIAGNOSIS — E669 Obesity, unspecified: Secondary | ICD-10-CM | POA: Insufficient documentation

## 2015-12-25 NOTE — H&P (Signed)
Brittany Hendrix is a 25 y.o. female, G5P0040 at 41.0 weeks, presenting for IOL due to late term pregnancy. Endorses FM & ctxs. Denies VB or LOF. Cervix was 1 cm in MAU on 12/26/15.   Patient Active Problem List   Diagnosis Date Noted  . Obesity (BMI 35.0-39.9 without comorbidity) (HCC) 12/25/2015  . Anemia affecting pregnancy 12/25/2015  . Seizure disorder in pregnancy, antepartum (HCC) 05/28/2015  . Supervision of high-risk pregnancy 05/28/2015  . 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   EDC of 12/21/15 was established by LMP, and that was c/w u/s at 9.6 wks.   Anatomy scan: 23.0 weeks, with normal findings and an anterior placenta.   Additional US evaluations: 5.0 wks (cramping): No SCH. Right ovary appears normal. Left ovary not definitively seen. Trace pelvic free fluid. Impression: Probable early intrauterine gestational sac, but no YS, fetal pole, or cardiac activity yet visualized. Recommend F/U quantitative B-HCG levels and f/u US in 14 days to confirm and assess viability. 5.6 wks (Assess viability): Single live intrauterine gestation. Trace free fluid noted. 8.2 wks (L lower quadrant pain and cramping): Small SCH noted. New (3.7 cm) simple cystic structure exophytic from or immediately adjacent to the right ovary. The complex lesion in the right ovary measures slightly smaller today 1.9 cm compared to 2.2 cm previously. Trace amount of free fluid is noted in the cul-de-sac. F/U recommended. 9.6 wks (bleeding): Corpus luteal cyst noted within the right ovary. 3.3 cm sonographically simple cyst adjacent to the right ovary. This may represent a paraovarian cyst or exophytic follicular cyst. Small SCH. Trace free fluid. Single living intrauterine gestation at 9 wks 6 days gestational age by CRL. FHR 158 bpm. 13.4 wks (MFM - nuchal  translucency): Normal. NB present. 29.0 wks (growth): Singleton pregnancy. Vertex presentation. Cvx measures 4.5 cm. Posterior placenta. AFI = 30th%. EFW = 1351 g, 55th%. Ovaries/adnexas unremarkable. 37.3 wks (growth due to h/o epilepsy on meds): Singleton Pregnancy. Vertex presentation. Posterior placenta. AFI is normal-75th%. EFW 3046 GRAMS; 6+11; 51.8%. Cervix not measured- per protocol. Adnexas unremarkable.    Significant prenatal events: ON KEPPRA FOR EPILEPSY--DX AS TEEN. FOLLOWED BY [facility], WITH THAT OFFICE MANAGING KEPPRA REGIMEN--CURRENTLY TAKING TID. HAD SEIZURE 09/17/15--? DUE TO DEHYDRATION. SEEN AT [facility], EVALUATED BY ER MD AND RAPID RESPONSE RN, NO CHANGE IN MEDS, REMAINS ON KEPPRA 500 MG PO TID. PATIENT WAS TO F/U AS PLANNED WITH [facility]. Due to some increased risk of growth issues and minor skeletal anomalies with Keppra, plan growth US q 4 weeks from 28 weeks. Had flu like symptoms (cough, h/a, chills, hot flashes, dizzy spells & sore throat) at 31+ wks. Also w/ cramping x 3 days. Took Tylenol and Robitussin Cough and Cold prn w/ benefit. HEENT: Clear. Chest: Clear. Heart: RRR. RAPID FLU TEST DONE & neg. SUPPORTIVE MEASURES REVIEWED. Seen in MAU at 38 wks for pelvic pain and at 40+ wks for ctxs. Declined both flu & Tdap. Normal pregnancy discomforts - relief measures reviewed. TWG 0.8 lbs.      Last evaluation: MAU by Sherre ScarletKimberly Williams, CNM @ 40.5 wks. Seen due to ctxs. Overall reassuring FHRT/BPP 8/8. Irregular ctxs. Cvx 1/60/-3. Normal BP.   OB History    Gravida Para Term Preterm AB TAB SAB Ectopic Multiple Living   5 0 0 0 4 1 3 0 0 0     3  SABs 1 TAB Past Medical History  Diagnosis Date  . Abortion history   . Headache(784.0)   . Seizures [facility])     Last seizure March 2017   Past Surgical History  Procedure Laterality Date  . Dilation and curettage of uterus      abortion   Family History: family history includes Asthma in her brother; Diabetes in  her mother. Social History:  reports that she quit smoking about 4 months ago. Her smoking use included Cigarettes. She has a 2.5 pack-year smoking history. She has never used smokeless tobacco. She reports that she drinks alcohol. She reports that she uses illicit drugs (Marijuana). 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 She will accept blood in an emergency.    Prenatal Transfer Tool  Maternal Diabetes: No Genetic Screening: Normal nuchal translucency Maternal Ultrasounds/Referrals: Normal Fetal Ultrasounds or other Referrals:  None Maternal Substance Abuse:  No Significant Maternal Medications:  Meds include: Other: Keppra 500 mg po bid, PNV, Tyl prn Significant Maternal Lab Results: Lab values include: Group B Strep negative  TDAP: Declined Flu: Declined  ROS: 10 Systems reviewed and are negative for acute change except as noted in the HPI.   No Known Allergies   Last menstrual period 03/16/2015.  Chest clear Heart RRR without murmur Abd gravid, NT, FH CWD Pelvic: As above Bishop score:  EFW: 7 lbs; adequate for trial of labor  Cephalic by Leopolds and VE Ext: FHR: BL   W/ moderate variability, +accels, no decels UCs:   Prenatal labs: ABO, Rh: A/POS/-- (11/03 0924) Antibody: NEG (11/03 0924) Rubella: Immune RPR: NON REAC (11/03 0924)  HBsAg: NEGATIVE (11/03 0924)  HIV: NONREACTIVE (11/03 0924)  GBS: Negative (05/03 0000) Sickle cell/Hgb electrophoresis: NA Pap: Normal 05/28/2015 at [facility] clinic GC: Neg 05/28/15 & 11/25/15 Chlamydia: Neg 05/28/15 & 11/25/15 Genetic screenings: Normal NT Glucola: Normal at 76, early glucola normal at [facility] clinic - done due to obesity Other: Urine culture = NG on 11/13/15; normal thrombophilia panel  Hgb 11.5 at NOB, 11.0 at 28 weeks   Assessment: IUP at 41.0 wks IOL due to late term pregnancy  H/O grand-mal seizures (on meds); last seizure Feb 2017 Latent  phase labor FWB: Cat 1 GBS neg Unfavorable cvx Obesity Anemia  Plan: Admit to [employer] Routine [facility] orders Pain med/epidural prn Reviewed plan of care with patient, including rationale and processes of induction, to include Cytotec, foley bulb, pitocin and AROM. Risks and benefits of induction were reviewed, including failure of method, prolonged labor, need for further intervention, and risk of cesarean.Patient seemed to understand these risks and wishes to proceed. In light of 1 cm cvx, will begin induction with  .   Sherre Scarlet, CNM 12/26/2015, 4 AM

## 2015-12-26 ENCOUNTER — Inpatient Hospital Stay (HOSPITAL_COMMUNITY): Payer: Medicaid Other | Admitting: Anesthesiology

## 2015-12-26 ENCOUNTER — Inpatient Hospital Stay (HOSPITAL_COMMUNITY)
Admission: AD | Admit: 2015-12-26 | Discharge: 2015-12-26 | Disposition: A | Discharge status: Home / Self Care | Payer: Medicaid Other | Source: Ambulatory Visit | Attending: Obstetrics and Gynecology | Admitting: Obstetrics and Gynecology

## 2015-12-26 ENCOUNTER — Inpatient Hospital Stay (HOSPITAL_COMMUNITY): Payer: Medicaid Other

## 2015-12-26 ENCOUNTER — Encounter (HOSPITAL_COMMUNITY): Payer: Self-pay

## 2015-12-26 ENCOUNTER — Inpatient Hospital Stay (HOSPITAL_COMMUNITY)
Admission: AD | Admit: 2015-12-26 | Discharge: 2015-12-29 | DRG: 775 | Disposition: A | Discharge status: Home / Self Care | Payer: Medicaid Other | Source: Ambulatory Visit | Attending: Obstetrics & Gynecology | Admitting: Obstetrics & Gynecology

## 2015-12-26 DIAGNOSIS — IMO0001 Reserved for inherently not codable concepts without codable children: Secondary | ICD-10-CM

## 2015-12-26 DIAGNOSIS — O99214 Obesity complicating childbirth: Secondary | ICD-10-CM | POA: Diagnosis present

## 2015-12-26 DIAGNOSIS — O411230 Chorioamnionitis, third trimester, not applicable or unspecified: Secondary | ICD-10-CM | POA: Diagnosis present

## 2015-12-26 DIAGNOSIS — O9902 Anemia complicating childbirth: Secondary | ICD-10-CM | POA: Diagnosis present

## 2015-12-26 DIAGNOSIS — D649 Anemia, unspecified: Secondary | ICD-10-CM | POA: Diagnosis present

## 2015-12-26 DIAGNOSIS — Z87891 Personal history of nicotine dependence: Secondary | ICD-10-CM

## 2015-12-26 DIAGNOSIS — Z3A40 40 weeks gestation of pregnancy: Secondary | ICD-10-CM

## 2015-12-26 DIAGNOSIS — O0990 Supervision of high risk pregnancy, unspecified, unspecified trimester: Secondary | ICD-10-CM

## 2015-12-26 DIAGNOSIS — O99354 Diseases of the nervous system complicating childbirth: Secondary | ICD-10-CM | POA: Diagnosis present

## 2015-12-26 DIAGNOSIS — E669 Obesity, unspecified: Secondary | ICD-10-CM | POA: Diagnosis present

## 2015-12-26 DIAGNOSIS — Z6838 Body mass index (BMI) 38.0-38.9, adult: Secondary | ICD-10-CM | POA: Diagnosis not present

## 2015-12-26 DIAGNOSIS — G40909 Epilepsy, unspecified, not intractable, without status epilepticus: Secondary | ICD-10-CM

## 2015-12-26 DIAGNOSIS — O2620 Pregnancy care for patient with recurrent pregnancy loss, unspecified trimester: Secondary | ICD-10-CM

## 2015-12-26 DIAGNOSIS — O288 Other abnormal findings on antenatal screening of mother: Secondary | ICD-10-CM

## 2015-12-26 DIAGNOSIS — O99353 Diseases of the nervous system complicating pregnancy, third trimester: Principal | ICD-10-CM

## 2015-12-26 DIAGNOSIS — O479 False labor, unspecified: Secondary | ICD-10-CM

## 2015-12-26 LAB — CBC
HEMATOCRIT: 33 % — AB (ref 36.0–46.0)
HEMOGLOBIN: 11.3 g/dL — AB (ref 12.0–15.0)
MCH: 30.7 pg (ref 26.0–34.0)
MCHC: 34.2 g/dL (ref 30.0–36.0)
MCV: 89.7 fL (ref 78.0–100.0)
Platelets: 252 10*3/uL (ref 150–400)
RBC: 3.68 MIL/uL — AB (ref 3.87–5.11)
RDW: 13.4 % (ref 11.5–15.5)
WBC: 9.9 10*3/uL (ref 4.0–10.5)

## 2015-12-26 LAB — TYPE AND SCREEN
ABO/RH(D): A POS
ANTIBODY SCREEN: NEGATIVE

## 2015-12-26 MED ORDER — LACTATED RINGERS IV SOLN
500.0000 mL | INTRAVENOUS | Status: DC | PRN
  Administered 2015-12-27: 500 mL via INTRAVENOUS

## 2015-12-26 MED ORDER — PROMETHAZINE HCL 25 MG/ML IJ SOLN: 25.0000 mg | Freq: Four times a day (QID) | INTRAMUSCULAR | Status: DC | PRN

## 2015-12-26 MED ORDER — PHENYLEPHRINE 40 MCG/ML (10ML) SYRINGE FOR IV PUSH (FOR BLOOD PRESSURE SUPPORT)
80.0000 ug | PREFILLED_SYRINGE | INTRAVENOUS | Status: DC | PRN
  Administered 2015-12-27 (×2): 80 ug via INTRAVENOUS
  Filled 2015-12-26: qty 5

## 2015-12-26 MED ORDER — EPHEDRINE 5 MG/ML INJ
10.0000 mg | INTRAVENOUS | Status: DC | PRN
  Filled 2015-12-26: qty 2

## 2015-12-26 MED ORDER — MISOPROSTOL 25 MCG QUARTER TABLET
25.0000 ug | ORAL_TABLET | Freq: Once | ORAL | Status: AC
  Administered 2015-12-26: 25 ug via VAGINAL
  Filled 2015-12-26: qty 0.25

## 2015-12-26 MED ORDER — PHENYLEPHRINE 40 MCG/ML (10ML) SYRINGE FOR IV PUSH (FOR BLOOD PRESSURE SUPPORT)
80.0000 ug | PREFILLED_SYRINGE | INTRAVENOUS | Status: DC | PRN
  Filled 2015-12-26 (×2): qty 10
  Filled 2015-12-26: qty 5

## 2015-12-26 MED ORDER — OXYCODONE-ACETAMINOPHEN 5-325 MG PO TABS: 2.0000 | ORAL_TABLET | ORAL | Status: DC | PRN

## 2015-12-26 MED ORDER — SODIUM CHLORIDE 0.9 % IV SOLN
3.0000 g | Freq: Four times a day (QID) | INTRAVENOUS | Status: AC
  Administered 2015-12-26 – 2015-12-27 (×5): 3 g via INTRAVENOUS
  Filled 2015-12-26 (×5): qty 3

## 2015-12-26 MED ORDER — ACETAMINOPHEN 325 MG PO TABS
650.0000 mg | ORAL_TABLET | ORAL | Status: DC | PRN
  Administered 2015-12-26: 650 mg via ORAL
  Filled 2015-12-26: qty 2

## 2015-12-26 MED ORDER — TERBUTALINE SULFATE 1 MG/ML IJ SOLN: 0.2500 mg | Freq: Once | INTRAMUSCULAR | Status: DC | PRN

## 2015-12-26 MED ORDER — LEVETIRACETAM 500 MG PO TABS
1000.0000 mg | ORAL_TABLET | Freq: Two times a day (BID) | ORAL | Status: DC
  Administered 2015-12-26 – 2015-12-29 (×6): 1000 mg via ORAL
  Filled 2015-12-26 (×7): qty 2

## 2015-12-26 MED ORDER — OXYTOCIN 40 UNITS IN LACTATED RINGERS INFUSION - SIMPLE MED
1.0000 m[IU]/min | INTRAVENOUS | Status: DC
  Administered 2015-12-26: 2 m[IU]/min via INTRAVENOUS
  Administered 2015-12-26: 8 m[IU]/min via INTRAVENOUS

## 2015-12-26 MED ORDER — MISOPROSTOL 200 MCG PO TABS: 200.0000 ug | ORAL_TABLET | Freq: Once | ORAL | Status: DC

## 2015-12-26 MED ORDER — LEVETIRACETAM 500 MG PO TABS
500.0000 mg | ORAL_TABLET | Freq: Two times a day (BID) | ORAL | Status: DC
  Filled 2015-12-26 (×3): qty 1

## 2015-12-26 MED ORDER — OXYTOCIN 40 UNITS IN LACTATED RINGERS INFUSION - SIMPLE MED
2.5000 [IU]/h | INTRAVENOUS | Status: DC
Start: 2015-12-26 — End: 2015-12-29
  Filled 2015-12-26: qty 1000

## 2015-12-26 MED ORDER — LACTATED RINGERS IV SOLN
500.0000 mL | Freq: Once | INTRAVENOUS | Status: AC
  Administered 2015-12-26: 500 mL via INTRAVENOUS

## 2015-12-26 MED ORDER — OXYTOCIN BOLUS FROM INFUSION: 500.0000 mL | INTRAVENOUS | Status: DC

## 2015-12-26 MED ORDER — SOD CITRATE-CITRIC ACID 500-334 MG/5ML PO SOLN: 30.0000 mL | ORAL | Status: DC | PRN

## 2015-12-26 MED ORDER — FENTANYL 2.5 MCG/ML BUPIVACAINE 1/10 % EPIDURAL INFUSION (WH - ANES)
14.0000 mL/h | INTRAMUSCULAR | Status: DC | PRN
  Administered 2015-12-26 – 2015-12-27 (×5): 14 mL/h via EPIDURAL
  Filled 2015-12-26 (×4): qty 125

## 2015-12-26 MED ORDER — LACTATED RINGERS IV SOLN
INTRAVENOUS | Status: DC
  Administered 2015-12-26 – 2015-12-27 (×5): via INTRAVENOUS

## 2015-12-26 MED ORDER — BUTORPHANOL TARTRATE 1 MG/ML IJ SOLN
1.0000 mg | INTRAMUSCULAR | Status: DC | PRN
  Administered 2015-12-26: 1 mg via INTRAVENOUS
  Filled 2015-12-26: qty 1

## 2015-12-26 MED ORDER — LIDOCAINE HCL (PF) 1 % IJ SOLN
INTRAMUSCULAR | Status: DC | PRN
  Administered 2015-12-26 (×2): 5 mL

## 2015-12-26 MED ORDER — ONDANSETRON HCL 4 MG/2ML IJ SOLN
4.0000 mg | Freq: Four times a day (QID) | INTRAMUSCULAR | Status: DC | PRN
  Administered 2015-12-26: 4 mg via INTRAVENOUS
  Filled 2015-12-26: qty 2

## 2015-12-26 MED ORDER — LIDOCAINE HCL (PF) 1 % IJ SOLN
30.0000 mL | INTRAMUSCULAR | Status: DC | PRN
Start: 2015-12-26 — End: 2015-12-29
  Filled 2015-12-26: qty 30

## 2015-12-26 MED ORDER — FLEET ENEMA 7-19 GM/118ML RE ENEM: 1.0000 | ENEMA | RECTAL | Status: DC | PRN

## 2015-12-26 MED ORDER — TERBUTALINE SULFATE 1 MG/ML IJ SOLN
0.2500 mg | Freq: Once | INTRAMUSCULAR | Status: DC | PRN
  Filled 2015-12-26: qty 1

## 2015-12-26 MED ORDER — OXYCODONE-ACETAMINOPHEN 5-325 MG PO TABS: 1.0000 | ORAL_TABLET | ORAL | Status: DC | PRN

## 2015-12-26 MED ORDER — DIPHENHYDRAMINE HCL 50 MG/ML IJ SOLN: 12.5000 mg | INTRAMUSCULAR | Status: DC | PRN

## 2015-12-26 NOTE — MAU Note (Signed)
Contractions since 2000. Denies LOF or bleeding. FT last sve

## 2015-12-26 NOTE — Progress Notes (Addendum)
Brittany Hendrix is a 25 y.o. G5P0040 at 4942w5d by LMP admitted for active labor, rupture of membranes  Subjective:   Objective: BP 111/67 mmHg  Pulse 75  Temp(Src) 97.5 F (36.4 C) (Oral)  Resp 20  Ht 5\' 4"  (1.626 m)  Wt 223 lb 9.6 oz (101.424 kg)  BMI 38.36 kg/m2  SpO2 100%  LMP 03/16/2015      FHT: Category 1.  Base line 115 -120 UC:   regular, every 2-3 minutes SVE:   Dilation: 3 Effacement (%): 70 Station: -2 Exam by:: sowder  Labs: Lab Results  Component Value Date   WBC 9.9 12/26/2015   HGB 11.3* 12/26/2015   HCT 33.0* 12/26/2015   MCV 89.7 12/26/2015   PLT 252 12/26/2015    Assessment / Plan: Latent labor cytotec placed Plan pitocin afterwards Anticipate SVD Will keep pt on Keppra.  She is doing well  Brittany Hendrix A 12/26/2015, 1:44 PM

## 2015-12-26 NOTE — Anesthesia Preprocedure Evaluation (Signed)
Anesthesia Evaluation  Patient identified by MRN, date of birth, ID band Patient awake    Reviewed: Allergy & Precautions, H&P , NPO status , Patient's Chart, lab work & pertinent test results  History of Anesthesia Complications Negative for: history of anesthetic complications  Airway Mallampati: II  TM Distance: >3 FB Neck ROM: full    Dental no notable dental hx. (+) Teeth Intact   Pulmonary neg pulmonary ROS, former smoker,    Pulmonary exam normal breath sounds clear to auscultation       Cardiovascular negative cardio ROS Normal cardiovascular exam Rhythm:regular Rate:Normal     Neuro/Psych Seizures -, Well Controlled,  negative psych ROS   GI/Hepatic negative GI ROS, Neg liver ROS,   Endo/Other  negative endocrine ROS  Renal/GU negative Renal ROS  negative genitourinary   Musculoskeletal   Abdominal   Peds  Hematology negative hematology ROS (+)   Anesthesia Other Findings   Reproductive/Obstetrics (+) Pregnancy                             Anesthesia Physical Anesthesia Plan  ASA: II  Anesthesia Plan: Epidural   Post-op Pain Management:    Induction:   Airway Management Planned:   Additional Equipment:   Intra-op Plan:   Post-operative Plan:   Informed Consent: I have reviewed the patients History and Physical, chart, labs and discussed the procedure including the risks, benefits and alternatives for the proposed anesthesia with the patient or authorized representative who has indicated his/her understanding and acceptance.     Plan Discussed with:   Anesthesia Plan Comments:         Anesthesia Quick Evaluation  

## 2015-12-26 NOTE — MAU Provider Note (Signed)
  History  Beckie SaltsLorena B Oblinger is a 25 yo G5P0040 @ 40.5 wks who presents unannounced to MAU w/ c/o ctxs since 8 pm. +FM. No VB or LOF.  Patient Active Problem List   Diagnosis Date Noted  . Obesity (BMI 35.0-39.9 without comorbidity) (HCC) 12/25/2015  . Anemia affecting pregnancy 12/25/2015  . Seizure disorder in pregnancy, antepartum (HCC) 05/28/2015  . Supervision of high-risk pregnancy 05/28/2015  . 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 Contractions   HPI  As above  OB History    Gravida Para Term Preterm AB TAB SAB Ectopic Multiple Living   5 0 0 0 4 1 3 0 0 0       Past Medical History  Diagnosis Date  . Abortion history   . Headache(784.0)   . Seizures [facility](HCC)     Last seizure March 2017    Past Surgical History  Procedure Laterality Date  . Dilation and curettage of uterus      abortion    Family History  Problem Relation Age of Onset  . Asthma Brother   . Diabetes Mother     Social History  Substance Use Topics  . Smoking status: Former Smoker -- 0.50 packs/day for 5 years    Types: Cigarettes    Quit date: 08/09/2015  . Smokeless tobacco: Never Used  . Alcohol Use: Yes     Comment: occasional, not while preg    Allergies: No Known Allergies  Prescriptions prior to admission  Medication Sig Dispense Refill Last Dose  . acetaminophen (TYLENOL) 500 MG tablet Take 1,000 mg by mouth every 6 (six) hours as needed for headache.   Past Month at Unknown time  . levETIRAcetam (KEPPRA) 1000 MG tablet Take 1 tablet (1,000 mg total) by mouth 2 (two) times daily. 60 tablet 11 12/25/2015 at 2200  . Prenatal Vit-Fe Fumarate-FA (PRENATAL MULTIVITAMIN) TABS tablet Take 1 tablet by mouth daily at 12 noon.   Past Week at Unknown time    ROS  As per HPI Physical Exam   Blood pressure 120/61, pulse 105, temperature 97.7 F (36.5 C), resp. rate 18, height 5\' 4"  (1.626 m), weight  101.424 kg (223 lb 9.6 oz), last menstrual period 03/16/2015.    Physical Exam  Gen: NAD. Laughing during ctxs. Pelvic: 1/60/-3 per RN. Ext: WNL. FHRT: BL 130 w/ moderate variability, +accels, earlys, no lates -- quite a bit of breaks in tracing Toco: Rare ctxs     ED Course  Assessment: Equivocal NST, but overall reasurring  Plan: BPP   Sherre ScarletWILLIAMS, Janequa Kipnis CNM, MS 12/26/2015 1:51 AM   ADDENDUM: BPP 8/8. Pt scheduled to return tomorrow night for IOL, sooner for worsening ctxs, decreased FM, LOF or VB like a period. Strict FKCs.   Sherre ScarletKimberly Waynette Towers, CNM 12/26/15, 2:23 AM

## 2015-12-26 NOTE — MAU Note (Signed)
Pt was here last night for labor check. Pt states contractions have continued all night and she hasn't been able to sleep. Pt states contractions are 10-20 minutes apart. Pt denies bleeding and leaking of fluid. Pt states baby is moving normally.

## 2015-12-26 NOTE — Progress Notes (Signed)
Assuming care of Brittany Hendrix, 25 yo G5P0040 @ 40.5 wks admitted in latent labor. Was scheduled for IOL for late term pregnancy on 12/28/15.   Subjective: Had several PCA doses before becoming comfortable. +FM. Denies VB or LOF. Family at bedside.   Objective: BP 101/51 mmHg  Pulse 130  Temp(Src) 101.8 F (38.8 C) (Axillary)  Resp 18  Ht 5\' 4"  (1.626 m)  Wt 101.424 kg (223 lb 9.6 oz)  BMI 38.36 kg/m2  SpO2 99%  LMP 03/16/2015   Total I/O In: -  Out: 400 [Urine:400] Today's Vitals   12/27/15 0000 12/27/15 0009 12/27/15 0031 12/27/15 0101  BP: 99/63  109/60 103/59  Pulse: 124  98 98  Temp:  98.7 F (37.1 C)  98.9 F (37.2 C)  TempSrc:  Axillary  Oral  Resp: 18  18 18   Height:      Weight:      SpO2:      PainSc:       FHT: BL 150-160 w/ elevation to 170s-180s w/ moderate variability, +accels, no decels UC: regular, every 2 minutes SVE:   Dilation: 4 Effacement (%): 90 Station: -1 Exam by:: KWilliams@ 2340 Pitocin at 14 mU/min AROM, clear fluid noted at 2340 IUPC placed w/o difficulty  Unasyn IV #1 given at 2144  Assessment:  Suspected chorio FWB: Tachycardia Maternal tachycardia GBS neg H/O Grand-Mal seizures (stable on Keppra)  Plan: Advised Dr. Normand Sloopillard of temp of 100.6 w/o ROM and the subsequent start of Unasyn. Will also order Tylenol to be given every 4 hours per Dr. Normand Sloopillard. Continue induction. Monitor maternal/fetal status closely. Expect progress.  Sherre ScarletWILLIAMS, Israella Hubert CNM 12/26/2015, 11:48 PM

## 2015-12-26 NOTE — Discharge Instructions (Signed)
Braxton Hicks Contractions °Contractions of the uterus can occur throughout pregnancy. Contractions are not always a sign that you are in labor.  °WHAT ARE BRAXTON HICKS CONTRACTIONS?  °Contractions that occur before labor are called Braxton Hicks contractions, or false labor. Toward the end of pregnancy (32-34 weeks), these contractions can develop more often and may become more forceful. This is not true labor because these contractions do not result in opening (dilatation) and thinning of the cervix. They are sometimes difficult to tell apart from true labor because these contractions can be forceful and people have different pain tolerances. You should not feel embarrassed if you go to the hospital with false labor. Sometimes, the only way to tell if you are in true labor is for your health care provider to look for changes in the cervix. °If there are no prenatal problems or other health problems associated with the pregnancy, it is completely safe to be sent home with false labor and await the onset of true labor. °HOW CAN YOU TELL THE DIFFERENCE BETWEEN TRUE AND FALSE LABOR? °False Labor °· The contractions of false labor are usually shorter and not as hard as those of true labor.   °· The contractions are usually irregular.   °· The contractions are often felt in the front of the lower abdomen and in the groin.   °· The contractions may go away when you walk around or change positions while lying down.   °· The contractions get weaker and are shorter lasting as time goes on.   °· The contractions do not usually become progressively stronger, regular, and closer together as with true labor.   °True Labor °· Contractions in true labor last 30-70 seconds, become very regular, usually become more intense, and increase in frequency.   °· The contractions do not go away with walking.   °· The discomfort is usually felt in the top of the uterus and spreads to the lower abdomen and low back.   °· True labor can be  determined by your health care provider with an exam. This will show that the cervix is dilating and getting thinner.   °WHAT TO REMEMBER °· Keep up with your usual exercises and follow other instructions given by your health care provider.   °· Take medicines as directed by your health care provider.   °· Keep your regular prenatal appointments.   °· Eat and drink lightly if you think you are going into labor.   °· If Braxton Hicks contractions are making you uncomfortable:   °¨ Change your position from lying down or resting to walking, or from walking to resting.   °¨ Sit and rest in a tub of warm water.   °¨ Drink 2-3 glasses of water. Dehydration may cause these contractions.   °¨ Do slow and deep breathing several times an hour.   °WHEN SHOULD I SEEK IMMEDIATE MEDICAL CARE? °Seek immediate medical care if: °· Your contractions become stronger, more regular, and closer together.   °· You have fluid leaking or gushing from your vagina.   °· You have a fever.   °· You pass blood-tinged mucus.   °· You have vaginal bleeding.   °· You have continuous abdominal pain.   °· You have low back pain that you never had before.   °· You feel your baby's head pushing down and causing pelvic pressure.   °· Your baby is not moving as much as it used to.   °  °This information is not intended to replace advice given to you by your health care provider. Make sure you discuss any questions you have with your health care   provider. °  °Document Released: 07/11/2005 Document Revised: 07/16/2013 Document Reviewed: 04/22/2013 °Elsevier Interactive Patient Education ©2016 [employer] Inc[employer] ° °

## 2015-12-26 NOTE — Anesthesia Procedure Notes (Signed)
Epidural Patient location during procedure: OB  Staffing Anesthesiologist: Randle Shatzer Performed by: anesthesiologist   Preanesthetic Checklist Completed: patient identified, site marked, surgical consent, pre-op evaluation, timeout performed, IV checked, risks and benefits discussed and monitors and equipment checked  Epidural Patient position: sitting Prep: DuraPrep Patient monitoring: heart rate, continuous pulse ox and blood pressure Approach: right paramedian Location: L3-L4 Injection technique: LOR saline  Needle:  Needle type: Tuohy  Needle gauge: 17 G Needle length: 9 cm and 9 Needle insertion depth: 6 cm Catheter type: closed end flexible Catheter size: 20 Guage Catheter at skin depth: 11 cm Test dose: negative  Assessment Events: blood not aspirated, injection not painful, no injection resistance, negative IV test and no paresthesia  Additional Notes Patient identified. Risks/Benefits/Options discussed with patient including but not limited to bleeding, infection, nerve damage, paralysis, failed block, incomplete pain control, headache, blood pressure changes, nausea, vomiting, reactions to medication both or allergic, itching and postpartum back pain. Confirmed with bedside nurse the patient's most recent platelet count. Confirmed with patient that they are not currently taking any anticoagulation, have any bleeding history or any family history of bleeding disorders. Patient expressed understanding and wished to proceed. All questions were answered. Sterile technique was used throughout the entire procedure. Please see nursing notes for vital signs. Test dose was given through epidural needle and negative prior to continuing to dose epidural or start infusion. Warning signs of high block given to the patient including shortness of breath, tingling/numbness in hands, complete motor block, or any concerning symptoms with instructions to call for help. Patient was given  instructions on fall risk and not to get out of bed. All questions and concerns addressed with instructions to call with any issues.   

## 2015-12-27 ENCOUNTER — Encounter (HOSPITAL_COMMUNITY): Payer: Self-pay | Admitting: *Deleted

## 2015-12-27 LAB — RPR: RPR Ser Ql: NONREACTIVE

## 2015-12-27 MED ORDER — ACETAMINOPHEN 325 MG PO TABS: 650.0000 mg | ORAL_TABLET | ORAL | Status: DC | PRN

## 2015-12-27 MED ORDER — SIMETHICONE 80 MG PO CHEW: 80.0000 mg | CHEWABLE_TABLET | ORAL | Status: DC | PRN

## 2015-12-27 MED ORDER — TETANUS-DIPHTH-ACELL PERTUSSIS 5-2.5-18.5 LF-MCG/0.5 IM SUSP
0.5000 mL | Freq: Once | INTRAMUSCULAR | Status: DC
Start: 2015-12-28 — End: 2015-12-29

## 2015-12-27 MED ORDER — BENZOCAINE-MENTHOL 20-0.5 % EX AERO: 1.0000 "application " | INHALATION_SPRAY | CUTANEOUS | Status: DC | PRN

## 2015-12-27 MED ORDER — DIBUCAINE 1 % RE OINT: 1.0000 "application " | TOPICAL_OINTMENT | RECTAL | Status: DC | PRN

## 2015-12-27 MED ORDER — ZOLPIDEM TARTRATE 5 MG PO TABS: 5.0000 mg | ORAL_TABLET | Freq: Every evening | ORAL | Status: DC | PRN

## 2015-12-27 MED ORDER — PRENATAL MULTIVITAMIN CH
1.0000 | ORAL_TABLET | Freq: Every day | ORAL | Status: DC
  Administered 2015-12-28 – 2015-12-29 (×2): 1 via ORAL
  Filled 2015-12-27 (×2): qty 1

## 2015-12-27 MED ORDER — ACETAMINOPHEN 325 MG PO TABS
650.0000 mg | ORAL_TABLET | ORAL | Status: DC
  Administered 2015-12-27 – 2015-12-29 (×9): 650 mg via ORAL
  Filled 2015-12-27 (×10): qty 2

## 2015-12-27 MED ORDER — DIPHENHYDRAMINE HCL 25 MG PO CAPS: 25.0000 mg | ORAL_CAPSULE | Freq: Four times a day (QID) | ORAL | Status: DC | PRN

## 2015-12-27 MED ORDER — ONDANSETRON HCL 4 MG PO TABS: 4.0000 mg | ORAL_TABLET | ORAL | Status: DC | PRN

## 2015-12-27 MED ORDER — IBUPROFEN 600 MG PO TABS
600.0000 mg | ORAL_TABLET | Freq: Four times a day (QID) | ORAL | Status: DC
  Administered 2015-12-27 – 2015-12-29 (×8): 600 mg via ORAL
  Filled 2015-12-27 (×8): qty 1

## 2015-12-27 MED ORDER — COCONUT OIL OIL
1.0000 "application " | TOPICAL_OIL | Status: DC | PRN
  Administered 2015-12-28: 1 via TOPICAL
  Filled 2015-12-27: qty 120

## 2015-12-27 MED ORDER — SENNOSIDES-DOCUSATE SODIUM 8.6-50 MG PO TABS
2.0000 | ORAL_TABLET | ORAL | Status: DC
  Administered 2015-12-28 (×2): 2 via ORAL
  Filled 2015-12-27 (×2): qty 2

## 2015-12-27 MED ORDER — ONDANSETRON HCL 4 MG/2ML IJ SOLN: 4.0000 mg | INTRAMUSCULAR | Status: DC | PRN

## 2015-12-27 MED ORDER — WITCH HAZEL-GLYCERIN EX PADS: 1.0000 "application " | MEDICATED_PAD | CUTANEOUS | Status: DC | PRN

## 2015-12-27 NOTE — Lactation Note (Signed)
This note was copied from a baby's chart. Lactation Consultation Note  Patient Name: Boy Farrel GobbleLorena Broughton ZOXWR'UToday's Date: 12/27/2015 Reason for consult: Initial assessment Baby at 5 hr of life. Mom wants to offer breast milk and formula because she has "small breast that probably want make that much". Encouraged her to start with breast and offer formula after bf is established, she was agreeable. She requested a pump. After discussing options of supplementing she wanted a curved tip syring and the spoon. Discussed baby behavior, feeding frequency, pumping, supplementing, artificial nipples, baby belly size, voids, wt loss, breast changes, and nipple care. Demonstrated manual expression, colostrum noted bilaterally. Given lactation handouts. Aware of OP services and support group.      Maternal Data Has patient been taught Hand Expression?: Yes Does the patient have breastfeeding experience prior to this delivery?: No  Feeding    LATCH Score/Interventions                      Lactation Tools Discussed/Used WIC Program: Yes Pump Review: Setup, frequency, and cleaning;Milk Storage Initiated by:: ES Date initiated:: 12/27/15   Consult Status Consult Status: Follow-up Date: 12/28/15 Follow-up type: In-patient    Rulon Eisenmengerlizabeth E Adellyn Capek 12/27/2015, 3:52 PM

## 2015-12-27 NOTE — Progress Notes (Signed)
  Subjective: Starting to feel ctxs again - has had several PCA doses.  Objective: BP 110/71 mmHg  Pulse 92  Temp(Src) 98.6 F (37 C) (Oral)  Resp 18  Ht 5\' 4"  (1.626 m)  Wt 101.424 kg (223 lb 9.6 oz)  BMI 38.36 kg/m2  SpO2 99%  LMP 03/16/2015 Today's Vitals   12/27/15 0501 12/27/15 0531 12/27/15 0601 12/27/15 0631  BP: 95/55 100/62 107/66 110/71  Pulse: 89 88 87 92  Temp:  98.6 F (37 C)    TempSrc:  Oral    Resp: 18 18 18    Height:      Weight:      SpO2:      PainSc:    7    Total I/O In: -  Out: 400 [Urine:400]  FHT: BL 150 w/ min variability, + accels, earlys UC:   irregular, every 2-3 minutes, MVUs 180 SVE:   Dilation: 9 Effacement (%): 100 Station: +1 Exam by:: LCarpenter,RN @ 0631 Pitocin at 22 mU/min  Assessment:  Active labor Overall reassuring FHRT Adequate MVUs   Plan: Expect SVD  Sherre ScarletWILLIAMS, Selim Durden CNM 12/27/2015, 6:51 AM

## 2015-12-27 NOTE — Progress Notes (Signed)
  Subjective: Tired. Has been able to get some sleep. Family at bedside.  Objective: BP 96/58 mmHg  Pulse 86  Temp(Src) 99.1 F (37.3 C) (Oral)  Resp 18  Ht 5\' 4"  (1.626 m)  Wt 101.424 kg (223 lb 9.6 oz)  BMI 38.36 kg/m2  SpO2 99%  LMP 03/16/2015 Today's Vitals   12/27/15 0241 12/27/15 0301 12/27/15 0331 12/27/15 0401  BP: 84/40 86/47 85/40  96/58  Pulse: 79 78 79 86  Temp:  99.1 F (37.3 C)    TempSrc:  Oral    Resp:  18  18  Height:      Weight:      SpO2:      PainSc:       Total I/O In: -  Out: 400 [Urine:400]  FHT: BL to 150-160 starting at 3 AM - min variability, no accels, no decels UC:   irregular, every 3-6 minutes, some coupling noted. MVUs 145 SVE:   Dilation: 7 Effacement (%): 100 Station: 0, -1 Exam by:: LCarpenter,RN @ 0345 Pitocin at 20 mU/min  BPs 70-90s/26-50s. Received IVF bolus @ 0235 and 80 mcg Phenylephrine x 2 @ 0235 and 0241 respectively  Received IV Unasyn at 2144 and 0358  Received Keppra 1000 mg at 2156  Has received 3 doses (650 mg) of Tylenol (1937, 0130 and 0530)  Assessment:  IUP at 40.6 wks Active labor Suspected chorio - responding well to Unasyn. Last temp 101.8 at 2345 on 12/26/15 Reasurring FHRT H/O seizure d/o; stable on Keppra -- last seizure Feb 2017 GBS neg Low BPs, esp when on side Inadequate MVUs, yet adequate cervical change  Plan: Continue induction. Continue antibiotics. Continue Tylenol q 4 hrs. Continue Keppra. Expect further progress.  Sherre ScarletWILLIAMS, Marguis Mathieson CNM 12/27/2015, 4:57 AM

## 2015-12-28 ENCOUNTER — Inpatient Hospital Stay (HOSPITAL_COMMUNITY)
Admission: RE | Admit: 2015-12-28 | Discharge: 2015-12-28 | Disposition: A | Discharge status: Home / Self Care | Payer: Medicaid Other | Source: Ambulatory Visit | Attending: Obstetrics and Gynecology | Admitting: Obstetrics and Gynecology

## 2015-12-28 DIAGNOSIS — O99019 Anemia complicating pregnancy, unspecified trimester: Secondary | ICD-10-CM

## 2015-12-28 DIAGNOSIS — E669 Obesity, unspecified: Secondary | ICD-10-CM

## 2015-12-28 LAB — CBC
HEMATOCRIT: 29.4 % — AB (ref 36.0–46.0)
HEMOGLOBIN: 9.9 g/dL — AB (ref 12.0–15.0)
MCH: 30.3 pg (ref 26.0–34.0)
MCHC: 33.7 g/dL (ref 30.0–36.0)
MCV: 89.9 fL (ref 78.0–100.0)
Platelets: 223 10*3/uL (ref 150–400)
RBC: 3.27 MIL/uL — AB (ref 3.87–5.11)
RDW: 13.5 % (ref 11.5–15.5)
WBC: 20.7 10*3/uL — AB (ref 4.0–10.5)

## 2015-12-28 MED ORDER — FERROUS SULFATE 325 (65 FE) MG PO TABS
325.0000 mg | ORAL_TABLET | Freq: Two times a day (BID) | ORAL | Status: DC
Start: 2015-12-28 — End: 2015-12-29
  Administered 2015-12-28 – 2015-12-29 (×3): 325 mg via ORAL
  Filled 2015-12-28 (×3): qty 1

## 2015-12-28 NOTE — Lactation Note (Signed)
This note was copied from a baby's chart. Lactation Consultation Note  Patient Name: Brittany Hendrix GEXBM'WToday's Date: 12/28/2015 Reason for consult: Follow-up assessment  Baby 28 hours old. Mom reports that she has not decided if she wants to keep nursing or not. Discussed supply and demand and enc putting baby to breast if she is thinking that she wants to continue to nurse. Offered to assist with latching baby, but mom declined stating that the baby just finished a bottle within the last hour. Enc mom to call her nurse for assistance with latching as needed.   Maternal Data    Feeding Feeding Type: Formula Nipple Type: Slow - flow  LATCH Score/Interventions                      Lactation Tools Discussed/Used     Consult Status Consult Status: Follow-up Date: 12/29/15 Follow-up type: In-patient    Geralynn OchsWILLIARD, Cervando Durnin 12/28/2015, 2:34 PM

## 2015-12-28 NOTE — Progress Notes (Signed)
Post Partum Day 1 Subjective:  Well. Lochia are normal. Voiding, ambulating, tolerating normal diet. nursing and bottlefeeding going well.  Objective: Blood pressure 104/62, pulse 63, temperature 97.5 F (36.4 C), temperature source Oral, resp. rate 18, height 5\' 4"  (1.626 m), weight 223 lb 9.6 oz (101.424 kg), last menstrual period 03/16/2015, SpO2 100 %, unknown if currently breastfeeding.  Physical Exam:  General: normal Lochia: appropriate Uterine Fundus: 0/1 firm non-tender  Extremities: No evidence of DVT seen on physical exam. Edema 1+     Recent Labs  12/26/15 1020 12/28/15 0506  HGB 11.3* 9.9*  HCT 33.0* 29.4*    Assessment/Plan: Normal Post-partum. Continue routine post-partum care. Chorioamnionitis, completed 24 hours Unasyn. Now afebrile. Recheck WBC tomorrow am Anticipate discharge tomorrow    LOS: 2 days   Ivyanna Sibert A MD 12/28/2015, 11:49 AM

## 2015-12-29 LAB — CBC
HEMATOCRIT: 25.8 % — AB (ref 36.0–46.0)
HEMOGLOBIN: 8.8 g/dL — AB (ref 12.0–15.0)
MCH: 31.1 pg (ref 26.0–34.0)
MCHC: 34.1 g/dL (ref 30.0–36.0)
MCV: 91.2 fL (ref 78.0–100.0)
Platelets: 227 10*3/uL (ref 150–400)
RBC: 2.83 MIL/uL — ABNORMAL LOW (ref 3.87–5.11)
RDW: 13.8 % (ref 11.5–15.5)
WBC: 11.9 10*3/uL — ABNORMAL HIGH (ref 4.0–10.5)

## 2015-12-29 MED ORDER — OXYCODONE-ACETAMINOPHEN 5-325 MG PO TABS
1.0000 | ORAL_TABLET | Freq: Four times a day (QID) | ORAL | Status: DC | PRN
Start: 2015-12-29 — End: 2017-04-17

## 2015-12-29 MED ORDER — IBUPROFEN 600 MG PO TABS: 600.0000 mg | ORAL_TABLET | Freq: Four times a day (QID) | ORAL | Status: DC | PRN

## 2015-12-29 NOTE — Anesthesia Postprocedure Evaluation (Signed)
Anesthesia Post Note  Patient: Brittany Hendrix  Procedure(s) Performed: * No procedures listed *  Patient location during evaluation: Mother Baby Anesthesia Type: Epidural Level of consciousness: awake and alert Pain management: pain level controlled Vital Signs Assessment: post-procedure vital signs reviewed and stable Respiratory status: spontaneous breathing, nonlabored ventilation and respiratory function stable Cardiovascular status: stable Postop Assessment: no headache, no backache and epidural receding Anesthetic complications: no     Last Vitals: There were no vitals filed for this visit.  Last Pain: There were no vitals filed for this visit. Pain Goal:                 Phillips Groutarignan, Renda Pohlman

## 2015-12-29 NOTE — Discharge Summary (Signed)
Obstetric Discharge Summary Reason for Admission: induction of labor Prenatal Procedures: ultrasound Intrapartum Procedures: spontaneous vaginal delivery Postpartum Procedures: none Complications-Operative and Postpartum: none HEMOGLOBIN  Date Value Ref Range Status  12/29/2015 8.8* 12.0 - 15.0 g/dL Final   HCT  Date Value Ref Range Status  12/29/2015 25.8* 36.0 - 46.0 % Final    Physical Exam:  General: alert and no distress Lochia: appropriate Uterine Fundus: firm Incision: n/a DVT Evaluation: No evidence of DVT seen on physical exam.  Discharge Diagnoses: Term Pregnancy-delivered  Discharge Information: Date: 12/29/2015 Activity: pelvic rest Diet: routine Medications: Ibuprofen and Percocet and cont keppra as per recommendations Condition: stable Instructions: refer to practice specific booklet Discharge to: home Follow-up Information    [facility] In 6 weeks.   Specialty:  Obstetrics and Gynecology   Why:  please call to schedule a post partum appt in 6wks   Contact information:   [address] [place] [id number] [phone]      Newborn Data: Live born female  Birth Weight: 8 lb 6 oz (3799 g) APGAR: 9, 9  Home with mother.  Pt plans abstinence.  She is bottle feeding.  Amanada Philbrick Y 12/29/2015, 9:45 AM

## 2015-12-29 NOTE — Lactation Note (Signed)
This note was copied from a baby's chart. Lactation Consultation Note  Mother states she does not like breastfeeding and has chosen to formula feed her baby only. Discussed cabbage leaf use for reducing her supply. Reviewed monitoring voids/stools. Mother states she has volume guidelines for formula.   Patient Name: Brittany Hendrix ZOXWR'UToday's Date: 12/29/2015     Maternal Data    Feeding Feeding Type: Bottle Fed - Formula Nipple Type: Slow - flow  LATCH Score/Interventions Latch:  (instructed mother to call for next latch)                    Lactation Tools Discussed/Used     Consult Status      Hardie PulleyBerkelhammer, Myrth Dahan Boschen 12/29/2015, 8:51 AM

## 2016-08-18 IMAGING — US US OB TRANSVAGINAL
1 series · 15 of 28 positions shown · non-contrast
Comparison: Most recent prior transvaginal ultrasound 05/15/2015

CLINICAL DATA: 23-year-old female G5P0 female currently 9 weeks
pregnant with bleeding.

EXAM:
OBSTETRIC <14 WK US AND TRANSVAGINAL OB US
TECHNIQUE: Both transabdominal and transvaginal ultrasound examinations were
performed for complete evaluation of the gestation as well as the
maternal uterus, adnexal regions, and pelvic cul-de-sac.
Transvaginal technique was performed to assess early pregnancy.

[Series 1: us ob transvaginal · 59 acquisitions, 15 frames shown]
[im 1/59]
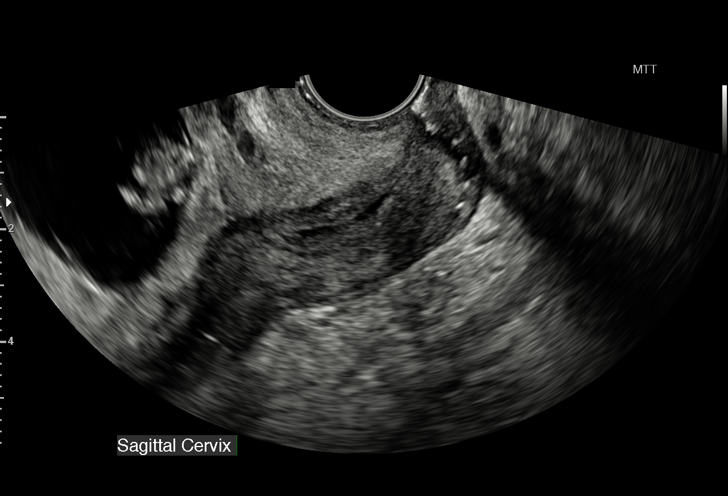
[im 5/59]
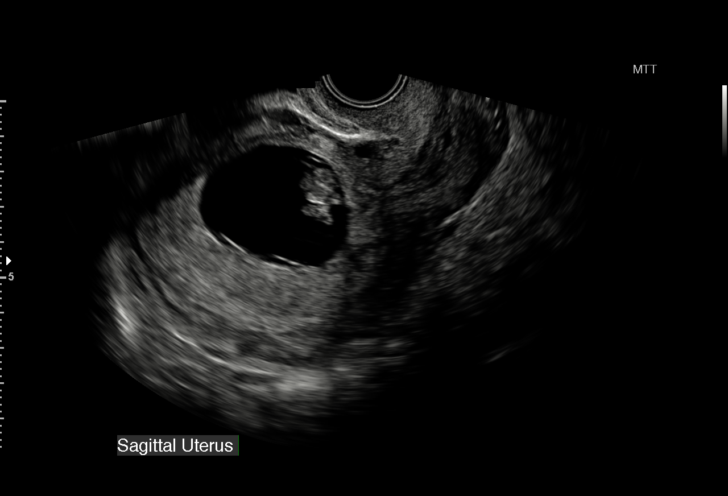
[im 9/59]
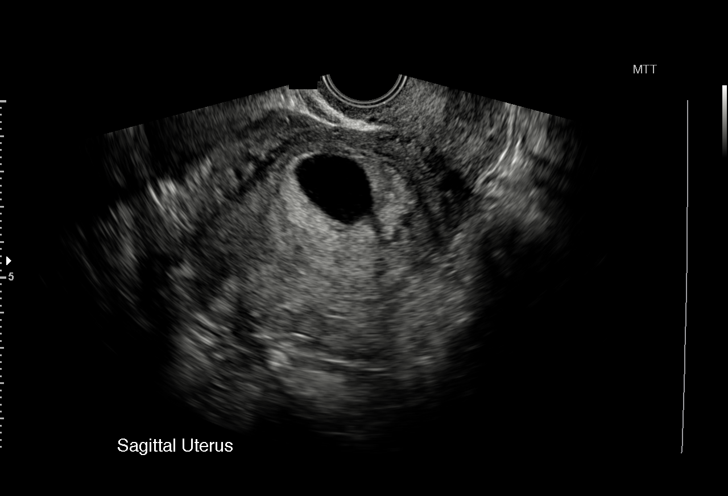
[im 13/59]
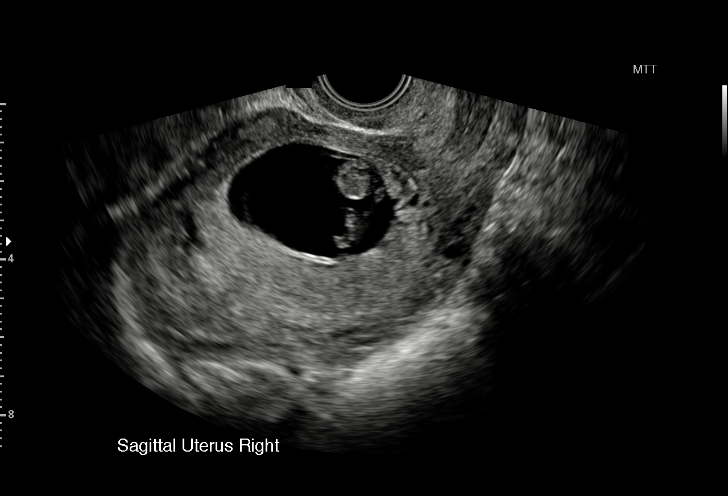
[im 18/59]
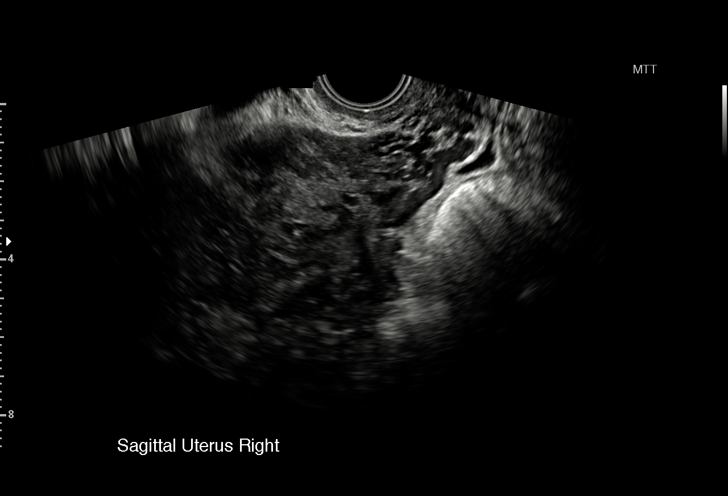
[im 22/59]
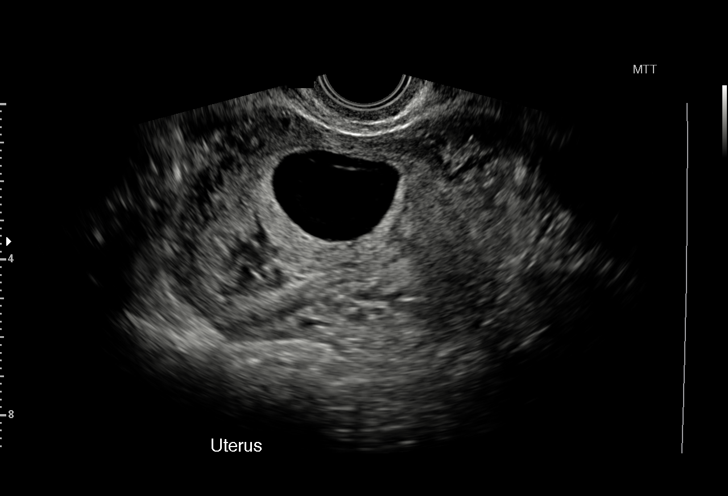
[im 26/59]
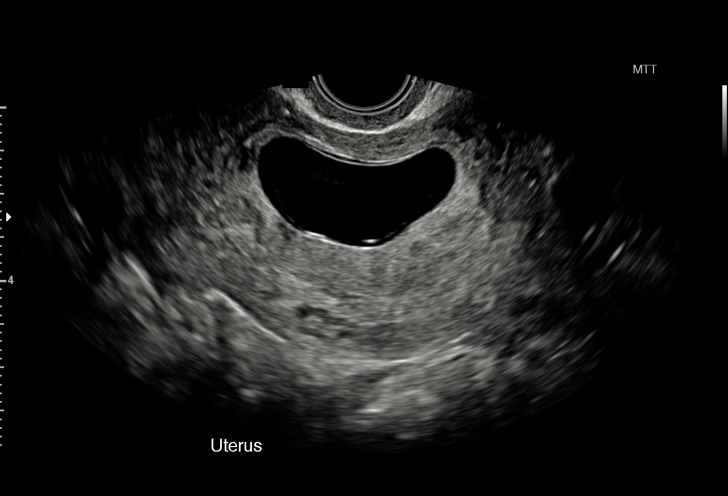
[im 31/59]
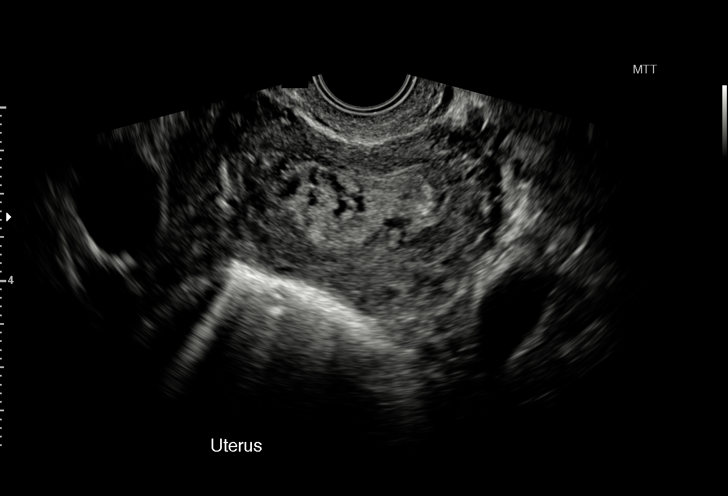
[im 33/59]
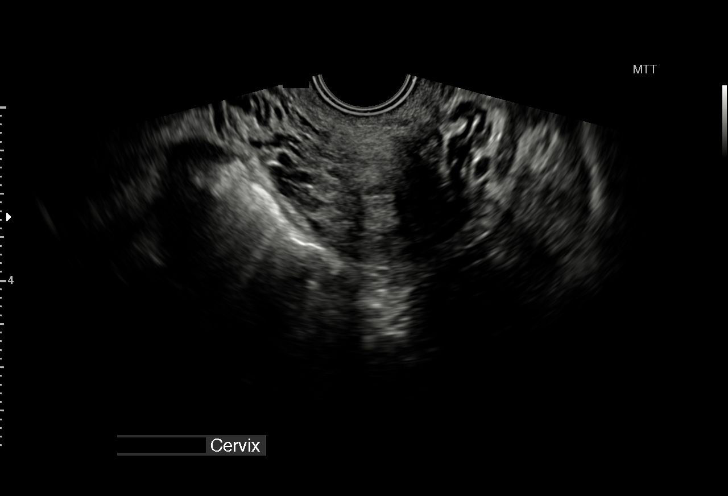
[im 37/59]
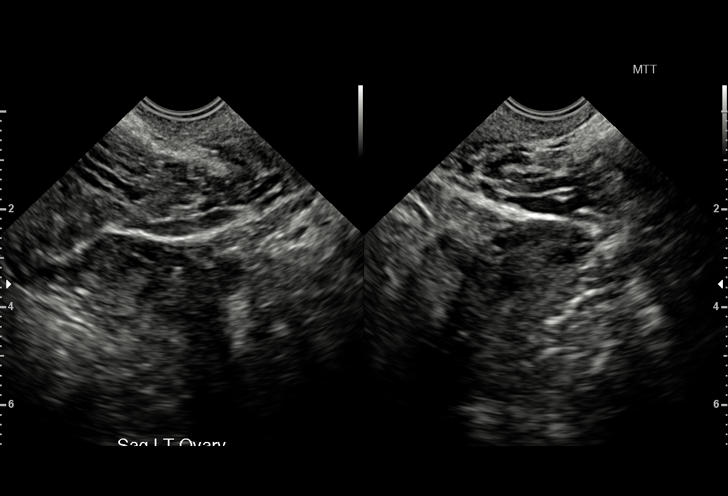
[im 41/59]
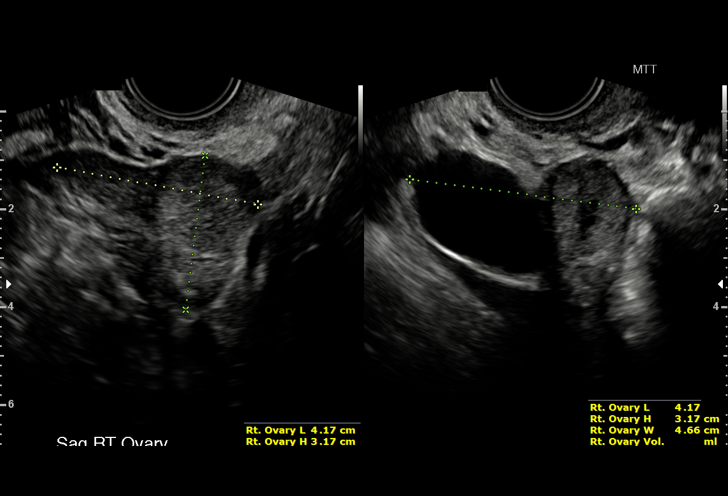
[im 46/59]
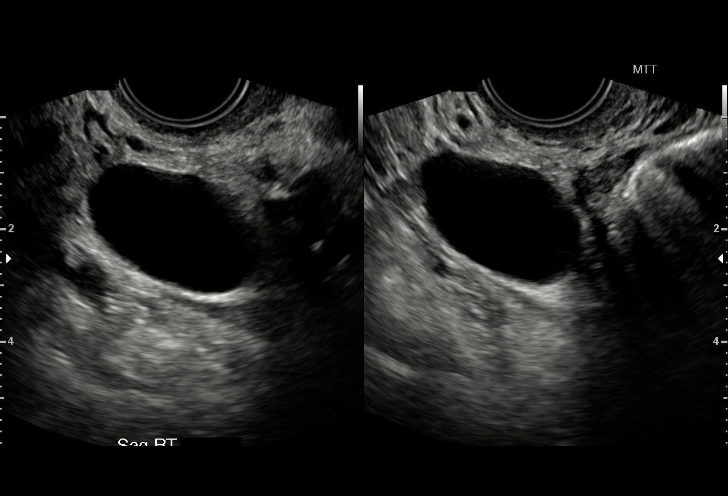
[im 50/59]
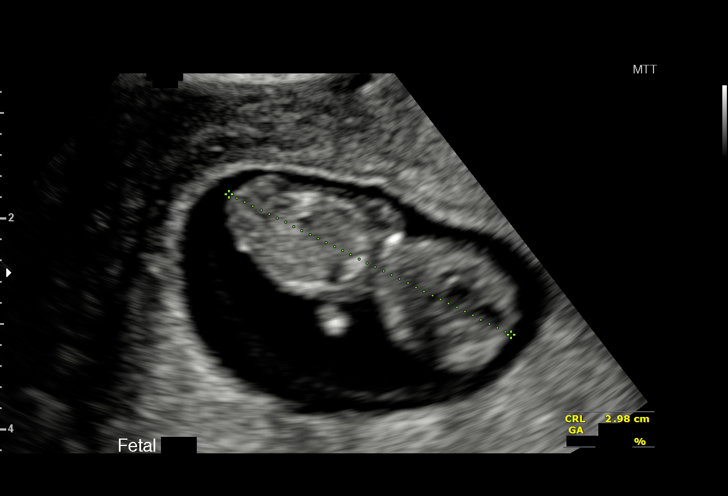
[im 54/59]
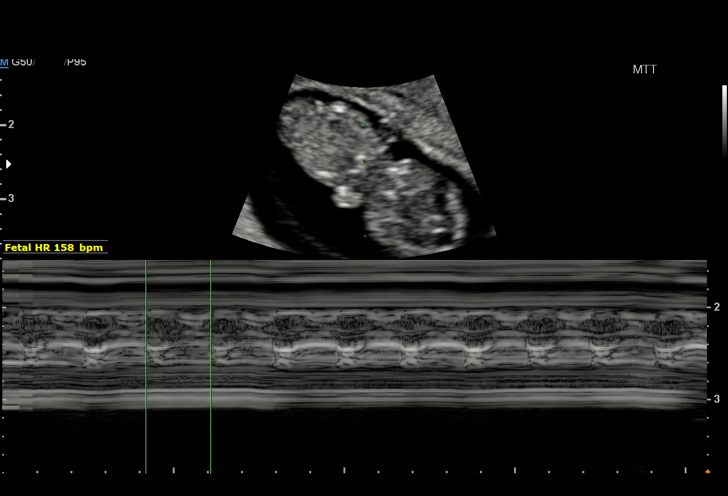
[im 59/59]
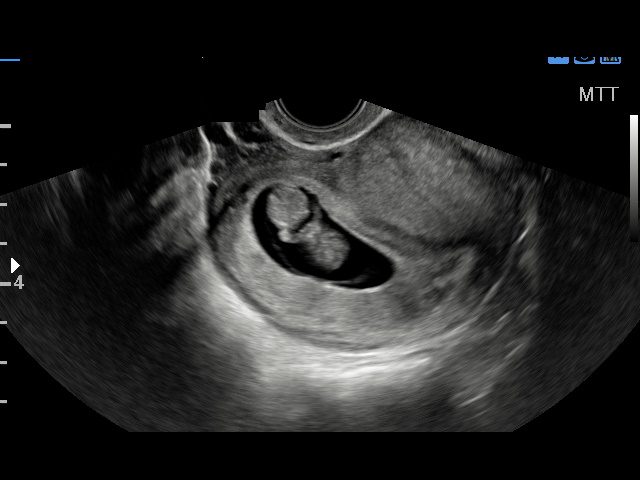

[15 of 28 positions shown; findings below may reference images not displayed]

FINDINGS: Intrauterine gestational sac: Single intrauterine gestational sac
again noted.

Yolk sac:  Present

Embryo:  Present

Cardiac Activity: Active

Heart Rate: 158  bpm

CRL:  30  mm   9 w   6 d                  US EDC: December 22, 2015

Maternal uterus/adnexae: Corpus luteal cyst noted within the right
ovary. 3.3 cm sonographically simple cyst adjacent to the right
ovary. This may represent a paraovarian cyst or exophytic follicular
cyst. Small subchorionic hemorrhage. Trace free-fluid.
IMPRESSION: Single living intrauterine gestation at 9 weeks 6 days gestational
age by crown-rump length.

The fetal heart rate is 158 beats per minute.

Small subchorionic hemorrhage.

## 2016-10-18 ENCOUNTER — Other Ambulatory Visit: Payer: Self-pay | Admitting: *Deleted

## 2016-10-18 ENCOUNTER — Telehealth: Payer: Self-pay | Admitting: Neurology

## 2016-10-18 MED ORDER — LEVETIRACETAM 1000 MG PO TABS: 1000.0000 mg | ORAL_TABLET | Freq: Two times a day (BID) | ORAL | 2 refills | Status: DC

## 2016-10-18 NOTE — Telephone Encounter (Signed)
Patient called office requesting refill for levETIRAcetam (KEPPRA) 1000 MG tablet.  [facility] [address]

## 2016-10-18 NOTE — Telephone Encounter (Signed)
Unable to reach patient by phone - both numbers in her chart are incorrect.  Provided her 90-days of medication.  A note was added to her prescription requesting a call back to schedule an appt in order to continue refills.

## 2016-12-22 IMAGING — US US OB TRANSVAGINAL
2 series · 14 of 28 positions shown · non-contrast
Comparison: Ultrasound April 20, 2015.

CLINICAL DATA: Assess viability ; first trimester of pregnancy.

EXAM:
TRANSVAGINAL OB ULTRASOUND
TECHNIQUE: Transvaginal ultrasound was performed for complete evaluation of the
gestation as well as the maternal uterus, adnexal regions, and
pelvic cul-de-sac.

[Series 1: us ob transvaginal · 0.11mm/px · 13 of 40 slices shown (1 of 2)]
[im 2/40]
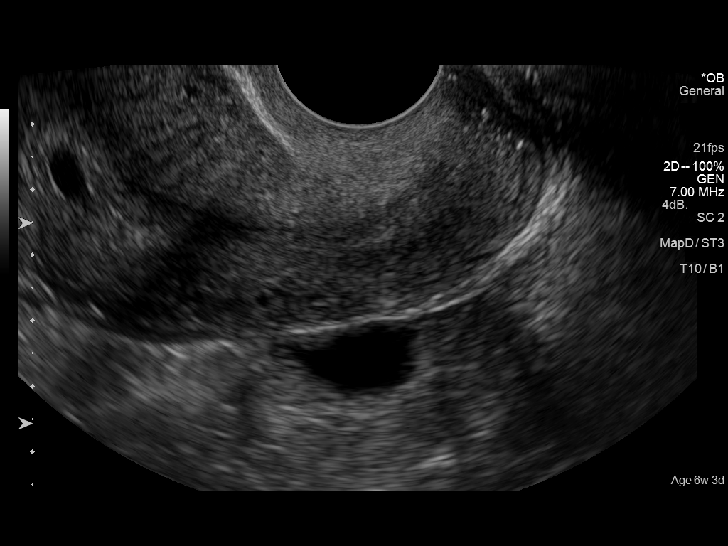
[im 5/40]
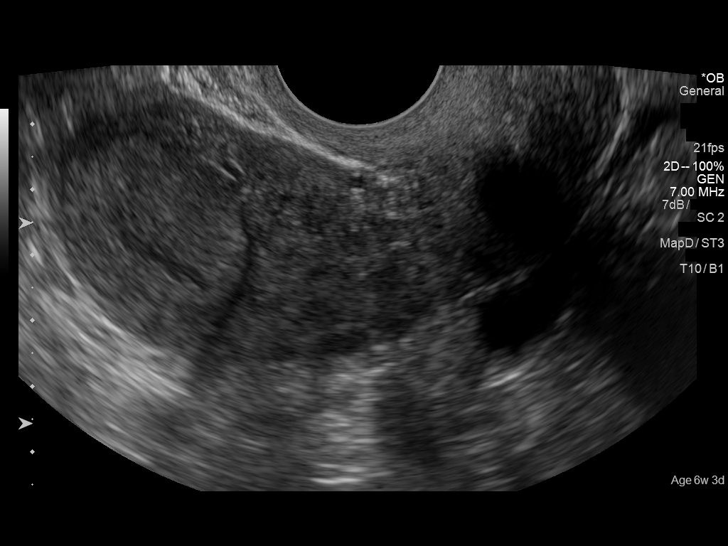
[im 8/40]
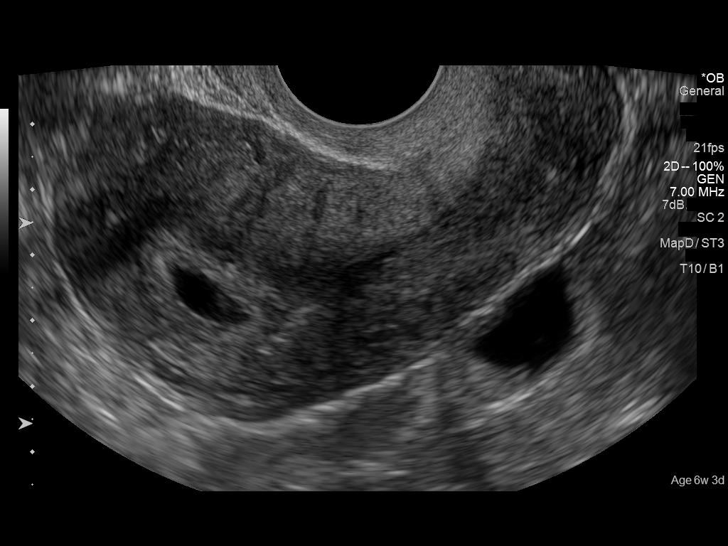
[im 11/40]
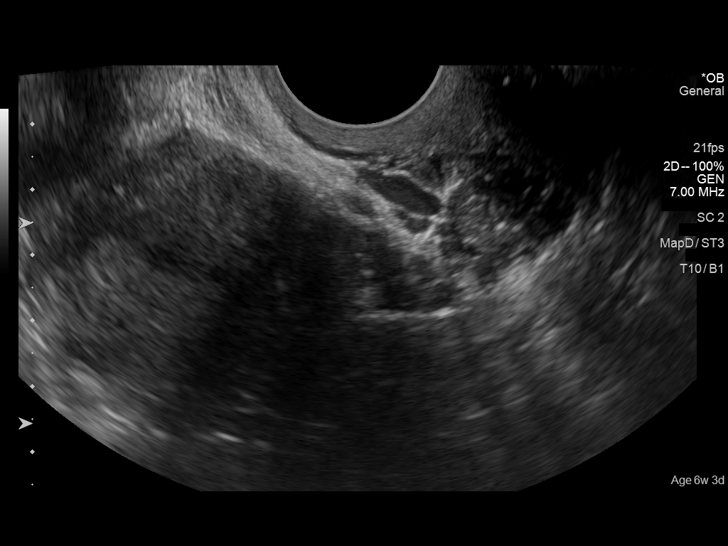
[im 14/40]
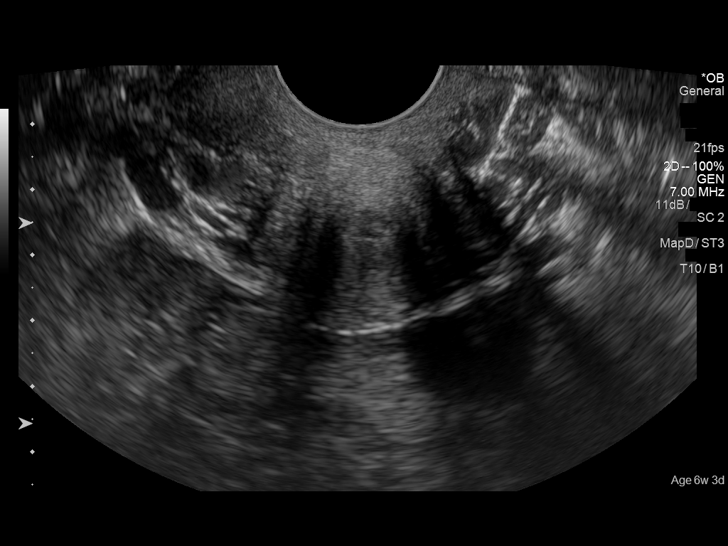
[im 17/40]
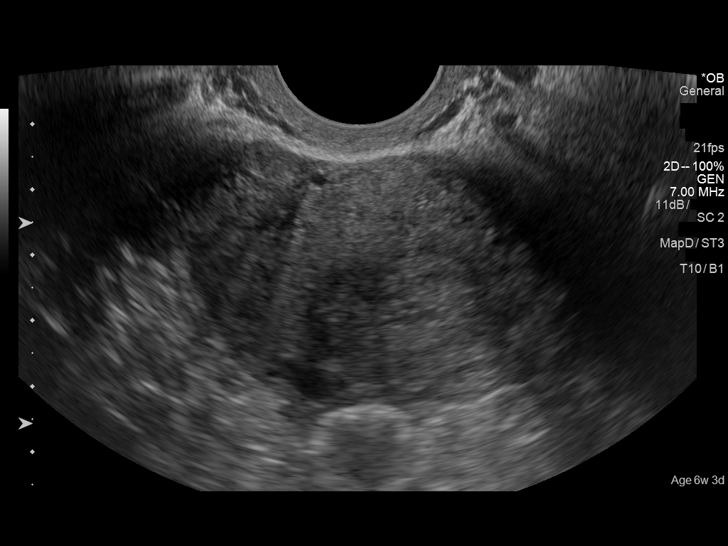
[im 20/40]
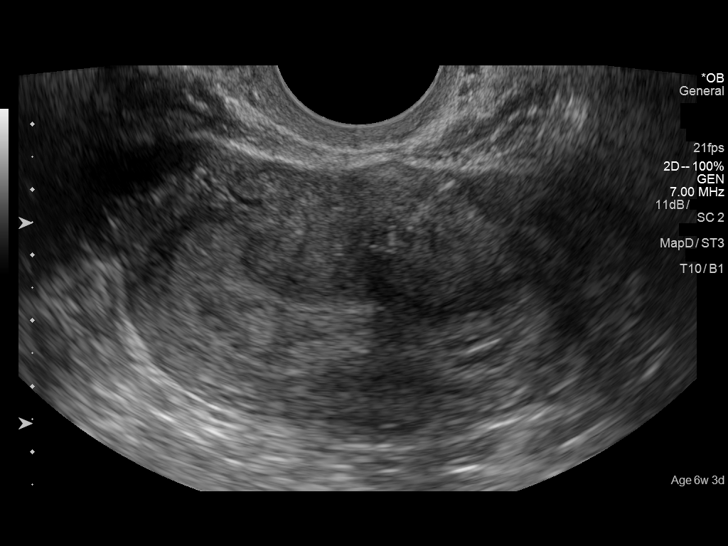
[im 23/40]
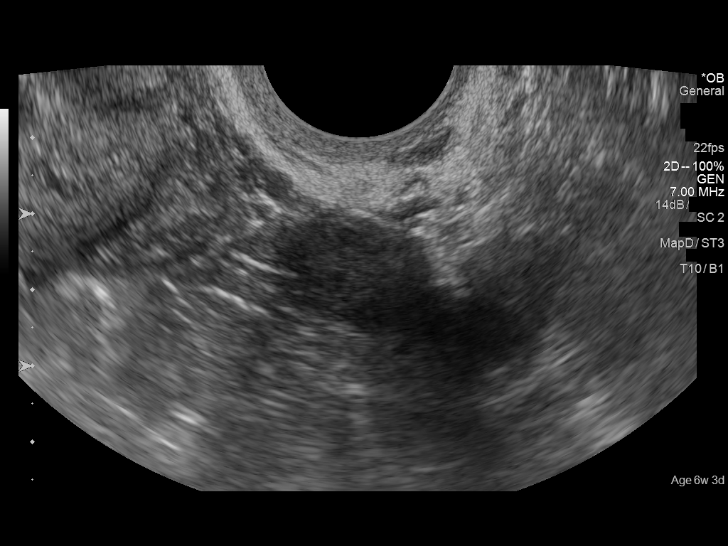
[im 26/40]
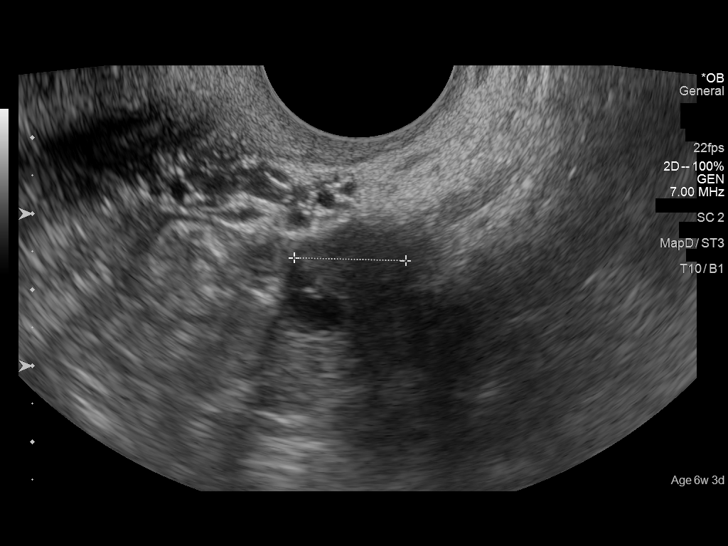
[im 29/40]
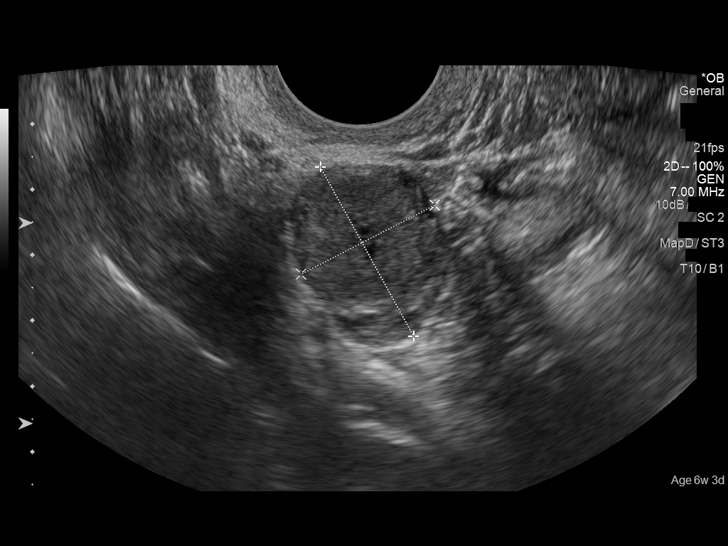
[im 32/40]
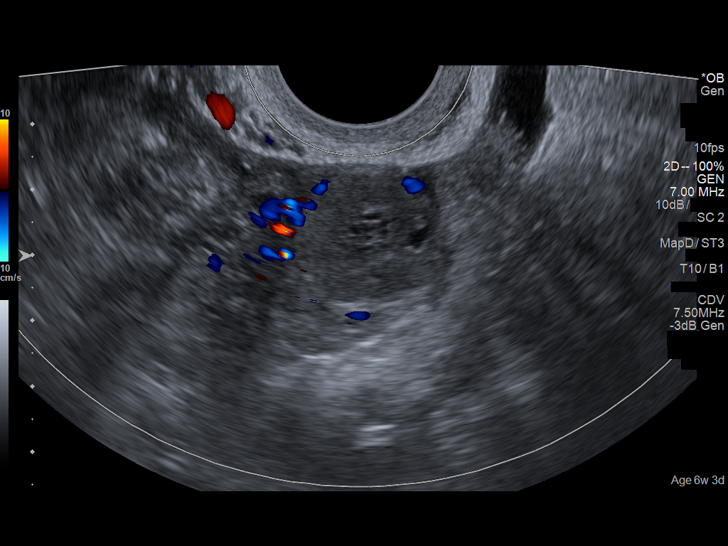
[im 35/40]
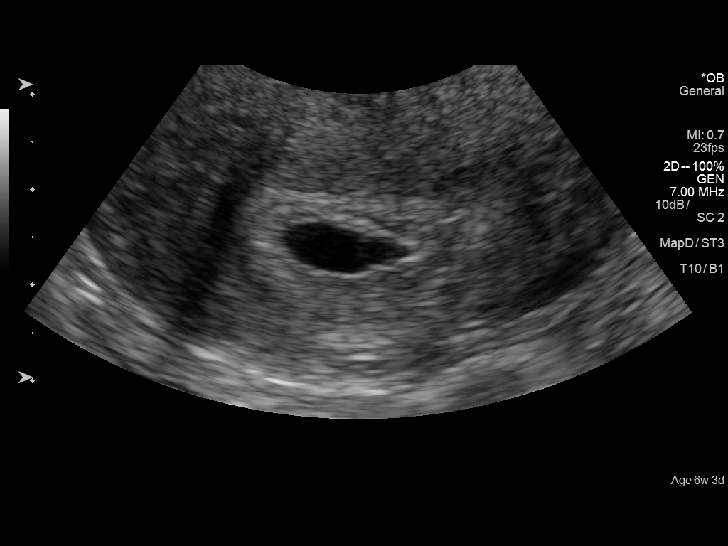
[im 38/40]
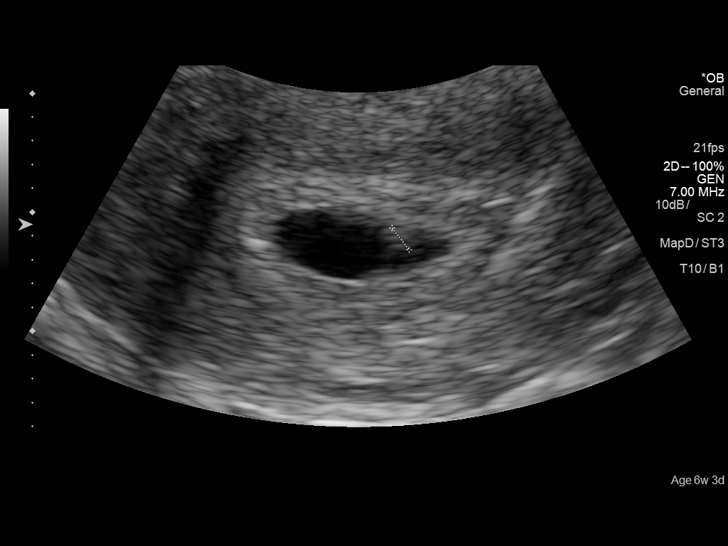

[Series 3: us ob transvaginal · 0.05mm/px · 1 of 1 slices shown (2 of 2)]
[im 1/1]
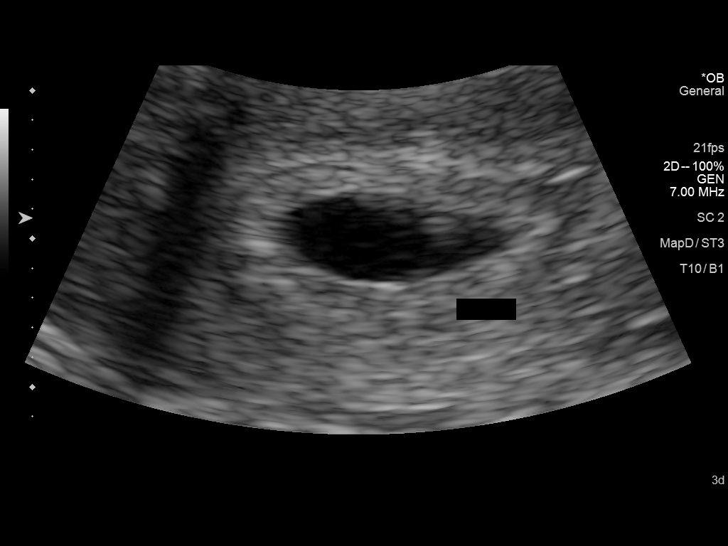

[14 of 28 positions shown; findings below may reference images not displayed]

FINDINGS: Intrauterine gestational sac: Visualized/normal in shape.

Yolk sac:  Visualized.

Embryo:  Visualized.

Cardiac Activity: Visualized.

Heart Rate: 106 bpm

CRL:   2.8  mm   5 w 6 d                  US EDC: December 24, 2015.

Maternal uterus/adnexae: Ovaries appear normal. Trace free fluid is
noted.
IMPRESSION: Single live intrauterine gestation of 5 weeks 6 days.

## 2017-01-09 ENCOUNTER — Ambulatory Visit: Payer: Medicaid Other | Admitting: Neurology

## 2017-01-09 ENCOUNTER — Telehealth: Payer: Self-pay | Admitting: *Deleted

## 2017-01-09 NOTE — Telephone Encounter (Signed)
No showed follow up appointment. 

## 2017-01-10 ENCOUNTER — Encounter: Payer: Self-pay | Admitting: Neurology

## 2017-01-16 ENCOUNTER — Other Ambulatory Visit: Payer: Self-pay | Admitting: Neurology

## 2017-01-17 ENCOUNTER — Other Ambulatory Visit: Payer: Self-pay | Admitting: *Deleted

## 2017-01-17 ENCOUNTER — Telehealth: Payer: Self-pay | Admitting: Neurology

## 2017-01-17 MED ORDER — LEVETIRACETAM 1000 MG PO TABS: ORAL_TABLET | ORAL | 3 refills | Status: DC

## 2017-01-17 NOTE — Telephone Encounter (Signed)
Patient called office requesting refill for levETIRAcetam (KEPPRA) 1000 MG tablet.  Patient has a scheduled fu visit with Dr. Terrace ArabiaYan 04/17/17.

## 2017-01-17 NOTE — Telephone Encounter (Signed)
Refills sent to last until her appt.

## 2017-03-24 ENCOUNTER — Encounter (HOSPITAL_COMMUNITY): Payer: Self-pay

## 2017-03-24 ENCOUNTER — Emergency Department (HOSPITAL_COMMUNITY)
Admission: EM | Admit: 2017-03-24 | Discharge: 2017-03-24 | Disposition: A | Discharge status: Home / Self Care | Payer: Medicaid Other | Attending: Emergency Medicine | Admitting: Emergency Medicine

## 2017-03-24 DIAGNOSIS — R569 Unspecified convulsions: Secondary | ICD-10-CM | POA: Insufficient documentation

## 2017-03-24 DIAGNOSIS — Z5321 Procedure and treatment not carried out due to patient leaving prior to being seen by health care provider: Secondary | ICD-10-CM | POA: Diagnosis not present

## 2017-03-24 LAB — CBG MONITORING, ED: Glucose-Capillary: 98 mg/dL (ref 65–99)

## 2017-03-24 MED ORDER — IBUPROFEN 400 MG PO TABS
400.0000 mg | ORAL_TABLET | Freq: Once | ORAL | Status: AC | PRN
  Administered 2017-03-24: 400 mg via ORAL

## 2017-03-24 MED ORDER — IBUPROFEN 400 MG PO TABS
ORAL_TABLET | ORAL | Status: AC
  Filled 2017-03-24: qty 1

## 2017-03-24 NOTE — ED Triage Notes (Signed)
Per Pt, Pt is coming from work with reports of having seizure. Pt was sitting down and did not fall or have injury. Complains of generalized pain and 8/10 headache. Hx of the same with last one 14 months ago. Pt alert and oriented x 4 upon arrival along with aware of seizure.

## 2017-03-24 NOTE — ED Notes (Signed)
Called pt for room. No answer.  

## 2017-03-24 NOTE — ED Notes (Signed)
Called pt for VS. No answer

## 2017-04-17 ENCOUNTER — Encounter: Payer: Self-pay | Admitting: Neurology

## 2017-04-17 ENCOUNTER — Ambulatory Visit (INDEPENDENT_AMBULATORY_CARE_PROVIDER_SITE_OTHER): Payer: Medicaid Other | Admitting: Neurology

## 2017-04-17 VITALS — BP 124/72 | HR 68 | Ht 64.0 in | Wt 224.5 lb

## 2017-04-17 DIAGNOSIS — G40909 Epilepsy, unspecified, not intractable, without status epilepticus: Secondary | ICD-10-CM | POA: Diagnosis not present

## 2017-04-17 MED ORDER — LEVETIRACETAM 1000 MG PO TABS: ORAL_TABLET | ORAL | 4 refills | Status: DC

## 2017-04-17 NOTE — Progress Notes (Signed)
Chief Complaint  Patient presents with  . Seizures    Reports having a seizure two weeks ago. States she had missed one dose of her Keppra the day prior to the event.        PATIENT: Brittany Hendrix DOB: Aug 06, 1990  Chief Complaint  Patient presents with  . Seizures    Reports having a seizure two weeks ago. States she had missed one dose of her Keppra the day prior to the event.      HISTORICAL  Brittany Hendrix is a 26 years old female, with epilepsy disorder, currently taking Keppra 500 in the morning, 1000 mg in the evening. Last seizure was in May 2014.   She had seizures since she was 75-51 years old.  All seizure has similar seminology. She has an aura before those seizures, she feels drowsy and sometimes nauseated, lasting few minutes, followed by loss of conciousness, whole body shaking, then post ictal confusion usually last 20-30 minutes..   She has injured herself in the face and on her leg  during seizures in the past. She was never on any anti-epilepsy medications, until after she had a recurrent seizure in May 2012, she was put on Levetiracetam 500 mg b.i.d.   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  her bloodwork tested positive for marijuana.   For a while, she was noncompliant with her medications, also concerned about the medication costs, she continue have recurrent seizures.   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  I reviewed laboratory, normal CMP, mild anemia 10.8,  UPDATE Sept 24 2018: Most recent seizure was on Sept 10 2018, she has aura of body jumpy, body nervous shaky, she gasp for air, LOC, she bite her lips followed by headache, body achy pain  REVIEW OF SYSTEMS: Full 14 system review of systems performed and notable only for seizure  ALLERGIES: No Known Allergies  HOME MEDICATIONS: Current Outpatient Prescriptions  Medication Sig Dispense Refill  . levETIRAcetam (KEPPRA) 1000 MG tablet Take one tablet twice daily. Please keep pending appt for further refills. 60 tablet 3  . Naproxen Sodium (ALEVE PO) Take by mouth as needed.     No current facility-administered medications for this visit.     PAST MEDICAL HISTORY: Past Medical History:  Diagnosis Date  . Abortion history   . Headache(784.0)   . Seizures [facility])    Last seizure March 2017  PAST SURGICAL HISTORY: Past Surgical History:  Procedure Laterality Date  . DILATION AND CURETTAGE OF UTERUS     abortion    FAMILY HISTORY: Family History  Problem Relation Age of Onset  . Diabetes Mother   . Asthma Brother     SOCIAL HISTORY:  Social History   Social History  . Marital status: Single    Spouse name: N/A  . Number of children: 0  . Years of education: 12   Occupational History  . [employer]   Social History Main Topics  . Smoking status: Former Smoker    Packs/day: 0.50    Years: 5.00    Types: Cigarettes    Quit  date: 08/09/2015  . Smokeless tobacco: Never Used  . Alcohol use Yes     Comment: occasional, not while preg  . Drug use: Yes    Types: Marijuana     Comment: last use Feb 2017  . Sexual activity: Yes    Birth control/ protection: None   Other Topics Concern  . Not on file   Social History Narrative   Patient is single and lives at home alone. Patient works at [employer]. Right handed. Caffeine three daily.     PHYSICAL EXAM   Vitals:   04/17/17 1155  BP: 124/72  Pulse: 68  Weight: 224 lb 8 oz (101.8 kg)  Height:  (1.626 m)    Not recorded      Body mass index is 38.54 kg/m.  PHYSICAL EXAMNIATION:  Gen: NAD, conversant, well nourised, obese, well groomed                     Cardiovascular: Regular rate rhythm, no peripheral edema, warm, nontender. Eyes: Conjunctivae clear without exudates or hemorrhage Neck: Supple, no carotid bruise. Pulmonary: Clear to auscultation bilaterally   NEUROLOGICAL EXAM:  MENTAL STATUS: Speech:    Speech is normal; fluent and spontaneous with normal comprehension.  Cognition:    The patient is oriented to person, place, and time;     recent and remote memory intact;     language fluent;     normal attention, concentration,     fund of knowledge.  CRANIAL NERVES: CN II: Visual fields are full to confrontation. Fundoscopic exam is normal with sharp discs and no vascular changes. Pupil equal round reactive to light CN III, IV, VI: extraocular movement are normal. No ptosis. CN V: Facial sensation is intact to pinprick in all 3 divisions bilaterally. Corneal responses are intact.  CN VII: Face is symmetric with normal eye closure and smile. CN VIII: Hearing is normal to rubbing fingers CN IX, X: Palate elevates symmetrically. Phonation is normal. CN XI: Head turning and shoulder shrug are intact CN XII: Tongue is midline with normal movements and no atrophy.  MOTOR: There is no pronator drift of out-stretched arms. Muscle  bulk and tone are normal. Muscle strength is normal.  REFLEXES: Reflexes are 2+ and symmetric at the biceps, triceps, knees, and ankles. Plantar responses are flexor.  SENSORY: Light touch, pinprick, position sense, and vibration sense are intact in fingers and toes.  COORDINATION: Rapid alternating movements and fine finger movements are intact. There is no dysmetria on finger-to-nose and heel-knee-shin.   GAIT/STANCE: Posture is normal. Gait is steady with normal steps, base, arm swing, and turning. Heel and toe walking are normal. Tandem gait is normal.  Romberg is absent.   DIAGNOSTIC DATA (LABS, IMAGING, TESTING) -  I reviewed patient records, labs, notes, testing and imaging myself where available.  Lab Results  Component Value Date   WBC 11.9 (H) 12/29/2015   HGB 8.8 (L) 12/29/2015   HCT 25.8 (L) 12/29/2015   MCV 91.2 12/29/2015   PLT 227 12/29/2015      Component Value Date/Time   NA 140 09/17/2015 2323   K 3.7 09/17/2015 2323   CL 110 09/17/2015 2323   CO2 21 (L) 09/17/2015 2323   GLUCOSE 89 09/17/2015 2323   BUN <5 (L) 09/17/2015 2323   CREATININE 0.47 09/17/2015 2323   CALCIUM 8.9 09/17/2015 2323   PROT 6.5 09/17/2015 2323   ALBUMIN 3.1 (L) 09/17/2015 2323   AST 16 09/17/2015 2323   ALT 17 09/17/2015 2323   ALKPHOS 87 09/17/2015 2323   BILITOT 0.3 09/17/2015 2323   GFRNONAA >60 09/17/2015 2323   GFRAA >60 09/17/2015 2323     ASSESSMENT AND PLAN  Evola Hollis Locascio is a 26 y.o. 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 Ph.D.  [facility] [address] [place], [place] [address] Ph: [phone] Fax:  [phone]

## 2017-05-11 ENCOUNTER — Telehealth: Payer: Self-pay | Admitting: Neurology

## 2017-05-11 NOTE — Telephone Encounter (Signed)
error 

## 2017-08-20 ENCOUNTER — Encounter (HOSPITAL_COMMUNITY): Payer: Self-pay

## 2017-08-20 ENCOUNTER — Inpatient Hospital Stay (HOSPITAL_COMMUNITY)
Admission: AD | Admit: 2017-08-20 | Discharge: 2017-08-20 | Disposition: A | Discharge status: Home / Self Care | Payer: Medicaid Other | Source: Ambulatory Visit | Attending: Obstetrics & Gynecology | Admitting: Obstetrics & Gynecology

## 2017-08-20 DIAGNOSIS — Z3A01 Less than 8 weeks gestation of pregnancy: Secondary | ICD-10-CM | POA: Insufficient documentation

## 2017-08-20 DIAGNOSIS — O26891 Other specified pregnancy related conditions, first trimester: Secondary | ICD-10-CM | POA: Diagnosis not present

## 2017-08-20 DIAGNOSIS — Z3201 Encounter for pregnancy test, result positive: Secondary | ICD-10-CM

## 2017-08-20 DIAGNOSIS — Z87891 Personal history of nicotine dependence: Secondary | ICD-10-CM | POA: Diagnosis not present

## 2017-08-20 DIAGNOSIS — N898 Other specified noninflammatory disorders of vagina: Secondary | ICD-10-CM | POA: Diagnosis present

## 2017-08-20 LAB — URINALYSIS, ROUTINE W REFLEX MICROSCOPIC
BILIRUBIN URINE: NEGATIVE
GLUCOSE, UA: NEGATIVE mg/dL
HGB URINE DIPSTICK: NEGATIVE
Ketones, ur: NEGATIVE mg/dL
Leukocytes, UA: NEGATIVE
Nitrite: NEGATIVE
Protein, ur: NEGATIVE mg/dL
Specific Gravity, Urine: 1.025 (ref 1.005–1.030)
pH: 6 (ref 5.0–8.0)

## 2017-08-20 LAB — POCT PREGNANCY, URINE: PREG TEST UR: POSITIVE — AB

## 2017-08-20 LAB — WET PREP, GENITAL
Clue Cells Wet Prep HPF POC: NONE SEEN
SPERM: NONE SEEN
Trich, Wet Prep: NONE SEEN
Yeast Wet Prep HPF POC: NONE SEEN

## 2017-08-20 NOTE — MAU Note (Signed)
Pt here with c/o vaginal discharge for the past 2 to 3 weeks and had positive pregnancy test at home. Denies any pain or bleeding.

## 2017-08-20 NOTE — MAU Provider Note (Signed)
Chief Complaint: Possible Pregnancy and Vaginal Discharge   None    SUBJECTIVE HPI: Brittany Hendrix is a 27 y.o. [phone] at Unknown who presents to Maternity Admissions reporting vaginal discharge and itching for past 2 to 3 weeks.  Treated with OTC monistat with no relief.  UPT positive at home.  Denies bleeding or abdominal pain. Pt has medical hx of seizures on Keppra. Location: vaginal discharge Quality: small amount white Duration: 2-3 weeks Modifying factors: OTC monistat   Past Medical History:  Diagnosis Date  . Abortion history   . Headache(784.0)   . Seizures (HCC)    Last seizure March 2017   OB History  Gravida Para Term Preterm AB Living  6 1 1  0 4 1  SAB TAB Ectopic Multiple Live Births  3 1 0 0 1    # Outcome Date GA Lbr Len/2nd Weight Sex Delivery Anes PTL Lv  6 Current           5 Term 12/27/15 [redacted]w[redacted]d 21:31 / 00:42 3.799 kg (8 lb 6 oz) M Vag-Spont EPI  LIV  4 SAB              Birth Comments: System Generated. Please review and update pregnancy details.  3 SAB           2 SAB           1 TAB              Past Surgical History:  Procedure Laterality Date  . DILATION AND CURETTAGE OF UTERUS     abortion   Social History   Socioeconomic History  . Marital status: Single    Spouse name: Not on file  . Number of children: 0  . Years of education: 74  . Highest education level: Not on file  Social Needs  . Financial resource strain: Not on file  . Food insecurity - worry: Not on file  . Food insecurity - inability: Not on file  . Transportation needs - medical: Not on file  . Transportation needs - non-medical: Not on file  Occupational History  . Occupation: Lobbyist: Production manager    Comment: [employer]  Tobacco Use  . Smoking status: Former Smoker    Packs/day: 0.50    Years: 5.00    Pack years: 2.50    Types: Cigarettes  . Smokeless tobacco: Never Used  Substance and Sexual Activity  . Alcohol use: Yes    Comment:  occasional, not while preg  . Drug use: Yes    Types: Marijuana    Comment: Jan 2019  . Sexual activity: Yes    Birth control/protection: None  Other Topics Concern  . Not on file  Social History Narrative   Patient is single and lives at home alone. Patient works at [employer]. Right handed. Caffeine three daily.   Family History  Problem Relation Age of Onset  . Diabetes Mother   . Asthma Brother    No current facility-administered medications on file prior to encounter.    Current Outpatient Medications on File Prior to Encounter  Medication Sig Dispense Refill  . levETIRAcetam (KEPPRA) 1000 MG tablet Take one tablet twice daily. Please keep pending appt for further refills. 180 tablet 4  . Naproxen Sodium (ALEVE PO) Take by mouth as needed.     No Known Allergies  I have reviewed patient's Past Medical Hx, Surgical Hx, Family Hx, Social Hx, medications and allergies.  Review of Systems  Constitutional: Negative.   HENT: Negative.   Eyes: Negative.   Respiratory: Negative.   Cardiovascular: Negative.   Gastrointestinal: Negative.   Endocrine: Negative.   Genitourinary: Positive for vaginal discharge.  Skin: Negative.   Allergic/Immunologic: Negative.   Hematological: Negative.   Psychiatric/Behavioral: Negative.     OBJECTIVE Patient Vitals for the past 24 hrs:  BP Temp Temp src Pulse Resp SpO2 Height Weight  08/20/17 2022 122/68 98.6 F (37 C) Oral 77 18 99 % 5\' 4"  (1.626 m) 103.9 kg (229 lb)   Constitutional: Well-developed, well-nourished female in no acute distress.  Cardiovascular: normal rate Respiratory: normal rate and effort.  GI: Abd soft, non-tender, gravid appropriate for gestational age. Pos BS x 4 MS: Extremities nontender, no edema, normal ROM Neurologic: Alert and oriented x 4.  GU: Neg CVAT.   LAB RESULTS Results for orders placed or performed during the hospital encounter of 08/20/17 (from the past 24 hour(s))  Urinalysis, Routine w  reflex microscopic     Status: Abnormal   Collection Time: 08/20/17  7:48 PM  Result Value Ref Range   Color, Urine YELLOW YELLOW   APPearance HAZY (A) CLEAR   Specific Gravity, Urine 1.025 1.005 - 1.030   pH 6.0 5.0 - 8.0   Glucose, UA NEGATIVE NEGATIVE mg/dL   Hgb urine dipstick NEGATIVE NEGATIVE   Bilirubin Urine NEGATIVE NEGATIVE   Ketones, ur NEGATIVE NEGATIVE mg/dL   Protein, ur NEGATIVE NEGATIVE mg/dL   Nitrite NEGATIVE NEGATIVE   Leukocytes, UA NEGATIVE NEGATIVE  Pregnancy, urine POC     Status: Abnormal   Collection Time: 08/20/17  8:43 PM  Result Value Ref Range   Preg Test, Ur POSITIVE (A) NEGATIVE  Wet prep, genital     Status: Abnormal   Collection Time: 08/20/17  9:00 PM  Result Value Ref Range   Yeast Wet Prep HPF POC NONE SEEN NONE SEEN   Trich, Wet Prep NONE SEEN NONE SEEN   Clue Cells Wet Prep HPF POC NONE SEEN NONE SEEN   WBC, Wet Prep HPF POC FEW (A) NONE SEEN   Sperm NONE SEEN     IMAGING No results found.  MAU COURSE Orders Placed This Encounter  Procedures  . Wet prep, genital  . Urinalysis, Routine w reflex microscopic  . Lab instructions  . Pregnancy, urine POC   No orders of the defined types were placed in this encounter.   MDM PE , ua and wet mount ASSESSMENT Vaginal discharge in early pregnancy.  PLAN Discussed normal discharge in prengnacy.  Danger signs and symptoms of miscarriage.  Encouraged to make an app. Discharge home in stable condition.    Kenney Housemanrothero, Nancy Jean, CNM 08/20/2017  9:39 PM

## 2017-08-21 LAB — GC/CHLAMYDIA PROBE AMP (~~LOC~~) NOT AT ARMC
CHLAMYDIA, DNA PROBE: NEGATIVE
NEISSERIA GONORRHEA: NEGATIVE

## 2017-10-04 ENCOUNTER — Other Ambulatory Visit: Payer: Self-pay

## 2017-10-04 ENCOUNTER — Encounter (HOSPITAL_COMMUNITY)
Admission: AD | Disposition: A | Discharge status: Home / Self Care | Payer: Self-pay | Source: Ambulatory Visit | Attending: Obstetrics and Gynecology

## 2017-10-04 ENCOUNTER — Encounter (HOSPITAL_COMMUNITY): Payer: Self-pay

## 2017-10-04 ENCOUNTER — Ambulatory Visit (HOSPITAL_COMMUNITY)
Admission: AD | Admit: 2017-10-04 | Discharge: 2017-10-04 | Disposition: A | Discharge status: Home / Self Care | Payer: Medicaid Other | Source: Ambulatory Visit | Attending: Obstetrics and Gynecology | Admitting: Obstetrics and Gynecology

## 2017-10-04 ENCOUNTER — Ambulatory Visit (HOSPITAL_COMMUNITY): Payer: Medicaid Other | Admitting: Anesthesiology

## 2017-10-04 DIAGNOSIS — R569 Unspecified convulsions: Secondary | ICD-10-CM | POA: Diagnosis not present

## 2017-10-04 DIAGNOSIS — Z6839 Body mass index (BMI) 39.0-39.9, adult: Secondary | ICD-10-CM | POA: Diagnosis not present

## 2017-10-04 DIAGNOSIS — O021 Missed abortion: Secondary | ICD-10-CM | POA: Insufficient documentation

## 2017-10-04 DIAGNOSIS — Z87891 Personal history of nicotine dependence: Secondary | ICD-10-CM | POA: Diagnosis not present

## 2017-10-04 HISTORY — PX: DILATION AND EVACUATION: SHX1459

## 2017-10-04 LAB — CBC
HEMATOCRIT: 38.4 % (ref 36.0–46.0)
Hemoglobin: 13.3 g/dL (ref 12.0–15.0)
MCH: 31.5 pg (ref 26.0–34.0)
MCHC: 34.6 g/dL (ref 30.0–36.0)
MCV: 91 fL (ref 78.0–100.0)
Platelets: 235 10*3/uL (ref 150–400)
RBC: 4.22 MIL/uL (ref 3.87–5.11)
RDW: 12.5 % (ref 11.5–15.5)
WBC: 7.6 10*3/uL (ref 4.0–10.5)

## 2017-10-04 SURGERY — DILATION AND EVACUATION, UTERUS
Anesthesia: Monitor Anesthesia Care | Site: Vagina

## 2017-10-04 MED ORDER — CHLOROPROCAINE HCL 1 % IJ SOLN
INTRAMUSCULAR | Status: AC
  Filled 2017-10-04: qty 30

## 2017-10-04 MED ORDER — ONDANSETRON HCL 4 MG/2ML IJ SOLN
INTRAMUSCULAR | Status: DC | PRN
  Administered 2017-10-04: 4 mg via INTRAVENOUS

## 2017-10-04 MED ORDER — MISOPROSTOL 200 MCG PO TABS
ORAL_TABLET | ORAL | Status: AC
  Filled 2017-10-04: qty 4

## 2017-10-04 MED ORDER — ONDANSETRON HCL 4 MG/2ML IJ SOLN
INTRAMUSCULAR | Status: AC
  Filled 2017-10-04: qty 2

## 2017-10-04 MED ORDER — KETOROLAC TROMETHAMINE 30 MG/ML IJ SOLN
INTRAMUSCULAR | Status: DC | PRN
  Administered 2017-10-04: 30 mg via INTRAVENOUS

## 2017-10-04 MED ORDER — LIDOCAINE HCL (CARDIAC) 20 MG/ML IV SOLN
INTRAVENOUS | Status: DC | PRN
  Administered 2017-10-04: 50 mg via INTRAVENOUS

## 2017-10-04 MED ORDER — IBUPROFEN 600 MG PO TABS: 600.0000 mg | ORAL_TABLET | Freq: Four times a day (QID) | ORAL | 0 refills | Status: DC | PRN

## 2017-10-04 MED ORDER — PROPOFOL 10 MG/ML IV BOLUS
INTRAVENOUS | Status: AC
  Filled 2017-10-04: qty 20

## 2017-10-04 MED ORDER — MIDAZOLAM HCL 2 MG/2ML IJ SOLN
INTRAMUSCULAR | Status: AC
  Filled 2017-10-04: qty 2

## 2017-10-04 MED ORDER — OXYCODONE HCL 5 MG/5ML PO SOLN: 5.0000 mg | Freq: Once | ORAL | Status: DC | PRN

## 2017-10-04 MED ORDER — LIDOCAINE HCL (CARDIAC) 20 MG/ML IV SOLN
INTRAVENOUS | Status: AC
  Filled 2017-10-04: qty 5

## 2017-10-04 MED ORDER — OXYCODONE HCL 5 MG PO TABS: 5.0000 mg | ORAL_TABLET | Freq: Once | ORAL | Status: DC | PRN

## 2017-10-04 MED ORDER — MIDAZOLAM HCL 2 MG/2ML IJ SOLN
INTRAMUSCULAR | Status: DC | PRN
  Administered 2017-10-04 (×2): 1 mg via INTRAVENOUS

## 2017-10-04 MED ORDER — HYDROMORPHONE HCL 1 MG/ML IJ SOLN
INTRAMUSCULAR | Status: AC
  Administered 2017-10-04: 0.5 mg via INTRAVENOUS
  Filled 2017-10-04: qty 1

## 2017-10-04 MED ORDER — CHLOROPROCAINE HCL 1 % IJ SOLN
INTRAMUSCULAR | Status: DC | PRN
  Administered 2017-10-04: 10 mL

## 2017-10-04 MED ORDER — FENTANYL CITRATE (PF) 100 MCG/2ML IJ SOLN
INTRAMUSCULAR | Status: DC | PRN
  Administered 2017-10-04 (×2): 25 ug via INTRAVENOUS
  Administered 2017-10-04: 100 ug via INTRAVENOUS
  Administered 2017-10-04: 25 ug via INTRAVENOUS

## 2017-10-04 MED ORDER — FENTANYL CITRATE (PF) 250 MCG/5ML IJ SOLN
INTRAMUSCULAR | Status: AC
Start: 2017-10-04 — End: ?
  Filled 2017-10-04: qty 5

## 2017-10-04 MED ORDER — MISOPROSTOL 200 MCG PO TABS
ORAL_TABLET | ORAL | Status: AC
  Filled 2017-10-04: qty 1

## 2017-10-04 MED ORDER — PROPOFOL 10 MG/ML IV BOLUS
INTRAVENOUS | Status: DC | PRN
  Administered 2017-10-04 (×8): 20 mg via INTRAVENOUS

## 2017-10-04 MED ORDER — SCOPOLAMINE 1 MG/3DAYS TD PT72
1.0000 | MEDICATED_PATCH | Freq: Once | TRANSDERMAL | Status: DC
  Administered 2017-10-04: 1.5 mg via TRANSDERMAL

## 2017-10-04 MED ORDER — DEXAMETHASONE SODIUM PHOSPHATE 10 MG/ML IJ SOLN
INTRAMUSCULAR | Status: DC | PRN
  Administered 2017-10-04: 10 mg via INTRAVENOUS

## 2017-10-04 MED ORDER — LACTATED RINGERS IV SOLN
INTRAVENOUS | Status: DC
  Administered 2017-10-04: 17:00:00 via INTRAVENOUS
  Administered 2017-10-04: 100 mL/h via INTRAVENOUS

## 2017-10-04 MED ORDER — HYDROMORPHONE HCL 1 MG/ML IJ SOLN
0.2500 mg | INTRAMUSCULAR | Status: DC | PRN
  Administered 2017-10-04 (×2): 0.5 mg via INTRAVENOUS

## 2017-10-04 MED ORDER — PROMETHAZINE HCL 25 MG/ML IJ SOLN: 6.2500 mg | INTRAMUSCULAR | Status: DC | PRN

## 2017-10-04 MED ORDER — SCOPOLAMINE 1 MG/3DAYS TD PT72
MEDICATED_PATCH | TRANSDERMAL | Status: AC
  Administered 2017-10-04: 1.5 mg via TRANSDERMAL
  Filled 2017-10-04: qty 1

## 2017-10-04 MED ORDER — ACETAMINOPHEN 10 MG/ML IV SOLN: 1000.0000 mg | Freq: Once | INTRAVENOUS | Status: DC | PRN

## 2017-10-04 SURGICAL SUPPLY — 17 items
CATH ROBINSON RED A/P 16FR (CATHETERS) ×3 IMPLANT
DECANTER SPIKE VIAL GLASS SM (MISCELLANEOUS) ×3 IMPLANT
GLOVE BIOGEL PI IND STRL 7.0 (GLOVE) ×2 IMPLANT
GLOVE BIOGEL PI INDICATOR 7.0 (GLOVE) ×4
GLOVE ECLIPSE 6.5 STRL STRAW (GLOVE) ×3 IMPLANT
GOWN STRL REUS W/TWL LRG LVL3 (GOWN DISPOSABLE) ×6 IMPLANT
KIT BERKELEY 1ST TRIMESTER 3/8 (MISCELLANEOUS) ×3 IMPLANT
NS IRRIG 1000ML POUR BTL (IV SOLUTION) ×3 IMPLANT
PACK VAGINAL MINOR WOMEN LF (CUSTOM PROCEDURE TRAY) ×3 IMPLANT
PAD OB MATERNITY 4.3X12.25 (PERSONAL CARE ITEMS) ×3 IMPLANT
PAD PREP 24X48 CUFFED NSTRL (MISCELLANEOUS) ×3 IMPLANT
SET BERKELEY SUCTION TUBING (SUCTIONS) ×3 IMPLANT
TOWEL OR 17X24 6PK STRL BLUE (TOWEL DISPOSABLE) ×3 IMPLANT
VACURETTE 10 RIGID CVD (CANNULA) ×3 IMPLANT
VACURETTE 7MM CVD STRL WRAP (CANNULA) IMPLANT
VACURETTE 8 RIGID CVD (CANNULA) IMPLANT
VACURETTE 9 RIGID CVD (CANNULA) IMPLANT

## 2017-10-04 NOTE — Transfer of Care (Signed)
Immediate Anesthesia Transfer of Care Note  Patient: Brittany Hendrix  Procedure(s) Performed: DILATATION AND EVACUATION (N/A Vagina )  Patient Location: PACU  Anesthesia Type:MAC  Level of Consciousness: sedated  Airway & Oxygen Therapy: Patient Spontanous Breathing and Patient connected to nasal cannula oxygen  Post-op Assessment: Report given to RN  Post vital signs: Reviewed and stable  Last Vitals:  Vitals:   10/04/17 1444  BP: 111/75  Pulse: 76  Resp: 16  Temp: 36.9 C  SpO2: 99%    Last Pain:  Vitals:   10/04/17 1444  TempSrc: Oral      Patients Stated Pain Goal: 5 (37/90/24 0973)  Complications: No apparent anesthesia complications

## 2017-10-04 NOTE — Discharge Instructions (Signed)
°Post Anesthesia Home Care Instructions ° °Activity: °Get plenty of rest for the remainder of the day. A responsible individual must stay with you for 24 hours following the procedure.  °For the next 24 hours, DO NOT: °-Drive a car °-Operate machinery °-Drink alcoholic beverages °-Take any medication unless instructed by your physician °-Make any legal decisions or sign important papers. ° °Meals: °Start with liquid foods such as gelatin or soup. Progress to regular foods as tolerated. Avoid greasy, spicy, heavy foods. If nausea and/or vomiting occur, drink only clear liquids until the nausea and/or vomiting subsides. Call your physician if vomiting continues. ° °Special Instructions/Symptoms: °Your throat may feel dry or sore from the anesthesia or the breathing tube placed in your throat during surgery. If this causes discomfort, gargle with warm salt water. The discomfort should disappear within 24 hours. ° °If you had a scopolamine patch placed behind your ear for the management of post- operative nausea and/or vomiting: ° °1. The medication in the patch is effective for 72 hours, after which it should be removed.  Wrap patch in a tissue and discard in the trash. Wash hands thoroughly with soap and water. °2. You may remove the patch earlier than 72 hours if you experience unpleasant side effects which may include dry mouth, dizziness or visual disturbances. °3. Avoid touching the patch. Wash your hands with soap and water after contact with the patch. °  ° °Dilation and Curettage or Vacuum Curettage, Care After °These instructions give you information about caring for yourself after your procedure. Your doctor may also give you more specific instructions. Call your doctor if you have any problems or questions after your procedure. °Follow these instructions at home: °Activity °· Do not drive or use heavy machinery while taking prescription pain medicine. °· For 24 hours after your procedure, avoid  driving. °· Take short walks often, followed by rest periods. Ask your doctor what activities are safe for you. After one or two days, you may be able to return to your normal activities. °· Do not lift anything that is heavier than 10 lb (4.5 kg) until your doctor approves. °· For at least 2 weeks, or as long as told by your doctor: °? Do not douche. °? Do not use tampons. °? Do not have sex. °General instructions °· Take over-the-counter and prescription medicines only as told by your doctor. This is very important if you take blood thinning medicine. °· Do not take baths, swim, or use a hot tub until your doctor approves. Take showers instead of baths. °· Wear compression stockings as told by your doctor. °· It is up to you to get the results of your procedure. Ask your doctor when your results will be ready. °· Keep all follow-up visits as told by your doctor. This is important. °Contact a doctor if: °· You have very bad cramps that get worse or do not get better with medicine. °· You have very bad pain in your belly (abdomen). °· You cannot drink fluids without throwing up (vomiting). °· You get pain in a different part of the area between your belly and thighs (pelvis). °· You have bad-smelling discharge from your vagina. °· You have a rash. °Get help right away if: °· You are bleeding a lot from your vagina. A lot of bleeding means soaking more than one sanitary pad in an hour, for 2 hours in a row. °· You have clumps of blood (blood clots) coming from your vagina. °· You have   a fever or chills. °· Your belly feels very tender or hard. °· You have chest pain. °· You have trouble breathing. °· You cough up blood. °· You feel dizzy. °· You feel light-headed. °· You pass out (faint). °· You have pain in your neck or shoulder area. °Summary °· Take short walks often, followed by rest periods. Ask your doctor what activities are safe for you. After one or two days, you may be able to return to your normal  activities. °· Do not lift anything that is heavier than 10 lb (4.5 kg) until your doctor approves. °· Do not take baths, swim, or use a hot tub until your doctor approves. Take showers instead of baths. °· Contact your doctor if you have any symptoms of infection, like bad-smelling discharge from your vagina. °This information is not intended to replace advice given to you by your health care provider. Make sure you discuss any questions you have with your health care provider. °Document Released: 04/19/2008 Document Revised: 03/28/2016 Document Reviewed: 03/28/2016 °Elsevier Interactive Patient Education © 2017 [employer] ° °

## 2017-10-04 NOTE — Op Note (Signed)
Preoperative diagnosis: Missed AB  Postoperative diagnosis: Same  Anesthesia: IV sedation and paracervical block  Anesthesiologist: Dr. Acey Lavarignan  Procedure: Dilatation and evacuation  Surgeon: Dr. Dois DavenportSandra Monay Houlton  Estimated blood loss: 100 cc  Procedure:  After being informed of the planned procedure with possible complications including bleeding, infection and injury to uterus, informed consent is obtained and patient is taken to or #7. She is placed in the lithotomy position and given IV sedation without complication. She is then prepped and draped in a sterile  fashion and her bladder is emptied with an in and out red rubber catheter.  Pelvic exam reveals a anteverted  uterus compatible with [redacted] weeks gestation. Both adnexa are felt and normal.  A weighted speculum is inserted in the vagina and the anterior lip of the cervix was grasped with a tenaculum forcep. We proceed with a paracervical block using 10 cc of Nesacaine 1%. The uterus was then sounded at 12 cm. The cervix was easily dilated using Hegar dilator until  #31. Using a #10 curved cannula, we proceed with evacuation of products of conception without difficulty. We then proceed with sharp curettage of the uterine cavity to confirm complete evacuation.  Instruments are then removed. Instrument and sponge count is complete x2.   Estimated blood loss is 100 cc. Patient is given 800 mcg of Cytotec per rectum and Toradol 30 mg IV before leaving the room.  The procedure is very well tolerated by the patient is taken to recovery room in a well and stable condition.  Specimen: Products of conception sent to pathology

## 2017-10-04 NOTE — H&P (Signed)
   Reason for admission:   Missed abortion  History:     Brittany Hendrix is a 27 y.o. female, [phone], presenting at 6 weeks of gestation to undergo D&E for missed AB. Patient initially diagnosed with failed pregnancy on 09/29/17. Was given Misoprostol protocol  Over the weekend with no bleeding.   Review of system:  Non-contributory   Past Medical History:   Past Medical History:  Diagnosis Date  . Abortion history   . Headache(784.0)   . Seizures [facility](HCC)    Last seizure March 2017    Allergies:  NKDA  Social History:   Single, former smoker, works [place] [employer] [place]  Family History:  Non-contributory  Physical exam:    General Appearance: Alert, appropriate appearance for age. No acute distress Neck / Thyroid: Supple, no masses, nodes or enlargement Lungs: clear to auscultation bilaterally Back: No CVA tenderness. Cardiovascular: Regular rate and rhythm. S1, S2, no murmur Gastrointestinal: Soft, non-tender, no masses or organomegaly Pelvic Exam: Vulva and vagina appear normal. Bimanual exam reveals normal uterus and adnexa. Closed cervix and no active bleeding per Dr Madalyn RobPinn's evaluation in the office.   Assessment:   Missed AB at 6 weeks   Hgb 12.5 on 09/05/17   Plan:    Proceed with D&E under IV sedation. Procedure, risks and benefits reviewed with patient including but not limited to bleeding, infection, injury to uterus and retained products of conception. Patient voices understanding and is agreeable to proceed.  Blood type A+  Brittany Hendrix A MD

## 2017-10-04 NOTE — Anesthesia Preprocedure Evaluation (Addendum)
Anesthesia Evaluation  Patient identified by MRN, date of birth, ID band Patient awake    Reviewed: Allergy & Precautions, NPO status , Patient's Chart, lab work & pertinent test results  Airway Mallampati: I  TM Distance: >3 FB Neck ROM: Full    Dental no notable dental hx.    Pulmonary Current Smoker, former smoker,    Pulmonary exam normal breath sounds clear to auscultation       Cardiovascular negative cardio ROS Normal cardiovascular exam Rhythm:Regular Rate:Normal     Neuro/Psych  Headaches, Seizures -,  negative psych ROS   GI/Hepatic negative GI ROS, Neg liver ROS,   Endo/Other  Morbid obesity  Renal/GU negative Renal ROS     Musculoskeletal negative musculoskeletal ROS (+)   Abdominal (+) + obese,   Peds  Hematology   Anesthesia Other Findings missed ab  Reproductive/Obstetrics                            Anesthesia Physical Anesthesia Plan  ASA: III  Anesthesia Plan: MAC   Post-op Pain Management:    Induction: Intravenous  PONV Risk Score and Plan: 2 and Ondansetron, Dexamethasone, Midazolam and Treatment may vary due to age or medical condition  Airway Management Planned: Natural Airway  Additional Equipment:   Intra-op Plan:   Post-operative Plan:   Informed Consent: I have reviewed the patients History and Physical, chart, labs and discussed the procedure including the risks, benefits and alternatives for the proposed anesthesia with the patient or authorized representative who has indicated his/her understanding and acceptance.   Dental advisory given  Plan Discussed with: CRNA  Anesthesia Plan Comments:         Anesthesia Quick Evaluation

## 2017-10-05 ENCOUNTER — Encounter (HOSPITAL_COMMUNITY): Payer: Self-pay | Admitting: Obstetrics and Gynecology

## 2017-10-05 NOTE — Anesthesia Postprocedure Evaluation (Signed)
Anesthesia Post Note  Patient: Brittany Hendrix  Procedure(s) Performed: DILATATION AND EVACUATION (N/A Vagina )     Patient location during evaluation: PACU Anesthesia Type: MAC Level of consciousness: awake and alert Pain management: pain level controlled Vital Signs Assessment: post-procedure vital signs reviewed and stable Respiratory status: spontaneous breathing, nonlabored ventilation, respiratory function stable and patient connected to nasal cannula oxygen Cardiovascular status: stable and blood pressure returned to baseline Postop Assessment: no apparent nausea or vomiting Anesthetic complications: no    Last Vitals:  Vitals:   10/04/17 1830 10/04/17 1900  BP: (!) 99/41 (!) 115/51  Pulse: (!) 55 (!) 55  Resp: 16 16  Temp: 36.9 C 36.4 C  SpO2: 99% 98%    Last Pain:  Vitals:   10/05/17 1020  TempSrc:   PainSc: 0-No pain                 Ryan P Ellender

## 2017-10-09 ENCOUNTER — Encounter: Payer: Self-pay | Admitting: General Practice

## 2017-10-09 ENCOUNTER — Encounter: Payer: Self-pay | Admitting: Obstetrics and Gynecology

## 2017-10-10 ENCOUNTER — Telehealth: Payer: Self-pay | Admitting: General Practice

## 2017-10-16 ENCOUNTER — Ambulatory Visit: Payer: Medicaid Other | Admitting: Nurse Practitioner

## 2017-11-08 NOTE — Progress Notes (Signed)
[facility]  PATIENT: Brittany Hendrix DOB: Nov 30, 1990   REASON FOR VISIT:follow up for seizure disorder HISTORY FROM: Patient    HISTORY OF PRESENT ILLNESS: Brittany Hendrix is a 27 years old female, with epilepsy disorder, currently taking Keppra 500 in the morning, 1000 mg in the evening. Last seizure was in May 2014.   She had seizures since she was 47-35 years old.  All seizure has similar seminology. She has an aura before those seizures, she feels drowsy and sometimes nauseated, lasting few minutes, followed by loss of conciousness, whole body shaking, then post ictal confusion usually last 20-30 minutes..   She has injured herself in the face and on her leg  during seizures in the past. She was never on any anti-epilepsy medications, until after she had a recurrent seizure in May 2012, she was put on Levetiracetam 500 mg b.i.d.   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  her bloodwork tested positive for marijuana.   For a while, she was noncompliant with her medications, also concerned about the medication costs, she continue have recurrent seizures.   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  I reviewed laboratory, normal CMP, mild anemia 10.8,  UPDATE Sept 24 2018:YY Most recent seizure was on Sept 10 2018, she has aura of body jumpy, body nervous shaky, she gasp for air, LOC, she bite her lips followed by headache, body achy pain  UPDATE 4/18/2019[name], 27 year old female returns for follow-up with history of seizure disorder.  Her last seizure was April 03, 2017.  She is currently on Keppra thousand milligrams twice daily.  She denies missing doses of her medication.  She denies any side effects.  She returns for reevaluation.  She is back to driving REVIEW OF SYSTEMS: Full 14 system review of systems performed and notable only for those listed, all others are neg:  Constitutional: neg  Cardiovascular: neg Ear/Nose/Throat: neg  Skin: neg Eyes: neg Respiratory: neg Gastroitestinal: neg  Hematology/Lymphatic: neg  Endocrine: neg Musculoskeletal:neg Allergy/Immunology: neg Neurological: History of seizure disorder Psychiatric: neg Sleep : neg   ALLERGIES: No Known Allergies  HOME MEDICATIONS: Outpatient Medications Prior to Visit  Medication Sig Dispense Refill  . ibuprofen (ADVIL,MOTRIN) 600 MG tablet Take 1 tablet (600 mg total) by mouth every 6 (six) hours as needed. 20 tablet 0  . levETIRAcetam (KEPPRA) 1000 MG tablet Take one tablet twice daily.  Please keep pending appt for further refills. 180 tablet 4   No facility-administered medications prior to visit.     PAST MEDICAL HISTORY: Past Medical History:  Diagnosis Date  . Abortion history   . Headache(784.0)   . Seizures (HCC)    Last seizure March 2017    PAST SURGICAL HISTORY: Past Surgical History:  Procedure Laterality Date  .  DILATION AND CURETTAGE OF UTERUS     abortion  . DILATION AND EVACUATION N/A 10/04/2017   Procedure: DILATATION AND EVACUATION;  Surgeon: Silverio Lay, MD;  Location: [facility];  Service: Gynecology;  Laterality: N/A;    FAMILY HISTORY: Family History  Problem Relation Age of Onset  . Diabetes Mother   . Asthma Brother     SOCIAL HISTORY: Social History   Socioeconomic History  . Marital status: Single    Spouse name: Not on file  . Number of children: 0  . Years of education: 62  . Highest education level: Not on file  Occupational History  . Occupation: Lobbyist: Production manager    Comment: [employer]  Social Needs  . Financial resource strain: Not on file  . Food insecurity:    Worry: Not on file    Inability: Not on file  . Transportation needs:    Medical: Not on file    Non-medical: Not on file  Tobacco Use  . Smoking status: Former Smoker    Packs/day: 0.50    Years: 5.00    Pack years: 2.50    Types: Cigarettes  . Smokeless tobacco: Never Used  Substance and Sexual Activity  . Alcohol use: Yes    Comment: occasional, not while preg  . Drug use: Yes    Types: Marijuana    Comment: Jan 2019  . Sexual activity: Yes    Birth control/protection: None  Lifestyle  . Physical activity:    Days per week: Not on file    Minutes per session: Not on file  . Stress: Not on file  Relationships  . Social connections:    Talks on phone: Not on file    Gets together: Not on file    Attends religious service: Not on file    Active member of club or organization: Not on file    Attends meetings of clubs or organizations: Not on file    Relationship status: Not on file  . Intimate partner violence:    Fear of current or ex partner: Not on file    Emotionally abused: Not on file    Physically abused: Not on file    Forced sexual activity: Not on file  Other Topics Concern  . Not on file  Social History Narrative   Patient is single and lives at home  alone. Patient works at [employer]. Right handed. Caffeine three daily.     PHYSICAL EXAM  Vitals:   11/09/17 0852  Weight: 228 lb 6.4 oz (103.6 kg)  Height: 5\' 4"  (1.626 m)   Body mass index is 39.2 kg/m.  Generalized: Well developed, obese female in no acute distress  Head: normocephalic and atraumatic,. Oropharynx benign  Neck: Supple,  Musculoskeletal: No deformity   Neurological examination   Mentation: Alert oriented to time, place, history taking. Attention span and concentration appropriate. Recent and remote memory intact.  Follows all commands speech and language fluent.   Cranial nerve II-XII: Pupils were equal round reactive to light extraocular movements were full, visual field were full on  confrontational test. Facial sensation and strength were normal. hearing was intact to finger rubbing bilaterally. Uvula tongue midline. head turning and shoulder shrug were normal and symmetric.Tongue protrusion into cheek strength was normal. Motor: normal bulk and tone, full strength in the BUE, BLE, Sensory: normal and symmetric to light touch,  Coordination: finger-nose-finger, heel-to-shin bilaterally, no dysmetria Reflexes: Brachioradialis 2/2, biceps 2/2, triceps 2/2, patellar 2/2, Achilles 2/2, plantar responses were flexor bilaterally. Gait and Station: Rising up from seated position without assistance, normal stance,  moderate stride, good arm swing, smooth turning, able to perform tiptoe, and heel walking without difficulty. Tandem gait is steady  DIAGNOSTIC DATA (LABS, IMAGING, TESTING) - I reviewed patient records, labs, notes, testing and imaging myself where available.  Lab Results  Component Value Date   WBC 7.6 10/04/2017   HGB 13.3 10/04/2017   HCT 38.4 10/04/2017   MCV 91.0 10/04/2017   PLT 235 10/04/2017      Component Value Date/Time   NA 140 09/17/2015 2323   K 3.7 09/17/2015 2323   CL 110 09/17/2015 2323   CO2 21 (L) 09/17/2015 2323   GLUCOSE 89  09/17/2015 2323   BUN <5 (L) 09/17/2015 2323   CREATININE 0.47 09/17/2015 2323   CALCIUM 8.9 09/17/2015 2323   PROT 6.5 09/17/2015 2323   ALBUMIN 3.1 (L) 09/17/2015 2323   AST 16 09/17/2015 2323   ALT 17 09/17/2015 2323   ALKPHOS 87 09/17/2015 2323   BILITOT 0.3 09/17/2015 2323   GFRNONAA >60 09/17/2015 2323   GFRAA >60 09/17/2015 2323       ASSESSMENT AND PLAN Brittany Hendrix, 27 year old returns for follow-up for 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  Normal EEG in the past.  Last seizure event April 11, 2017 and Keppra was increased at that time to 1000 mg twice daily.  No further seizure activity               PLAN: Continue Keppra thousand milligrams twice dailywill refill Follow-up yearly and as needed Cough or any seizure activity Discussed seizure triggers Please remember, common seizure triggers are: Sleep deprivation, dehydration, overheating, stress, hypoglycemia or skipping meals, certain medications or excessive alcohol use, especially stopping alcohol abruptly if you have had heavy alcohol use before (aka alcohol withdrawal seizure). If you have a prolonged seizure over 2-5 minutes or back to back seizures, call or have someone call 911 or take you to the nearest emergency room. You cannot drive a car or operate any other machinery or vehicle within 6 months of a seizure. Please do not swim alone or take a tub bath for safety. Do not cook with large quantities of boiling water or oil for safety. Take your medicine for seizure prevention regularly and do not skip doses or stop medication abruptly and tone are told to do so by your healthcare provider.[name] Avoid taking Wellbutrin, narcotic pain medications and tramadol, as they can lower seizure threshold.  I spent 20 in total face to face time with the patient more than 50% of which was spent counseling and coordination of care,  reviewing test results reviewing medications and discussing and reviewing the diagnosis of seizure disorder and common seizure triggers Nilda Riggs, [facility], [facility], APRN  [facility] [address], [address] [place], [place] [address] [phone]

## 2017-11-09 ENCOUNTER — Encounter: Payer: Self-pay | Admitting: Nurse Practitioner

## 2017-11-09 ENCOUNTER — Encounter (HOSPITAL_COMMUNITY): Payer: Self-pay | Admitting: Emergency Medicine

## 2017-11-09 ENCOUNTER — Ambulatory Visit: Payer: Medicaid Other | Admitting: Nurse Practitioner

## 2017-11-09 ENCOUNTER — Emergency Department (HOSPITAL_COMMUNITY)
Admission: EM | Admit: 2017-11-09 | Discharge: 2017-11-10 | Disposition: A | Discharge status: Home / Self Care | Payer: Medicaid Other | Attending: Emergency Medicine | Admitting: Emergency Medicine

## 2017-11-09 VITALS — Ht 64.0 in | Wt 228.4 lb

## 2017-11-09 DIAGNOSIS — R569 Unspecified convulsions: Secondary | ICD-10-CM | POA: Insufficient documentation

## 2017-11-09 DIAGNOSIS — S025XXA Fracture of tooth (traumatic), initial encounter for closed fracture: Secondary | ICD-10-CM | POA: Diagnosis not present

## 2017-11-09 DIAGNOSIS — G40909 Epilepsy, unspecified, not intractable, without status epilepticus: Secondary | ICD-10-CM

## 2017-11-09 DIAGNOSIS — W1830XA Fall on same level, unspecified, initial encounter: Secondary | ICD-10-CM | POA: Diagnosis not present

## 2017-11-09 DIAGNOSIS — Y939 Activity, unspecified: Secondary | ICD-10-CM | POA: Diagnosis not present

## 2017-11-09 DIAGNOSIS — Y929 Unspecified place or not applicable: Secondary | ICD-10-CM | POA: Insufficient documentation

## 2017-11-09 DIAGNOSIS — S00531A Contusion of lip, initial encounter: Secondary | ICD-10-CM | POA: Diagnosis not present

## 2017-11-09 DIAGNOSIS — Z87891 Personal history of nicotine dependence: Secondary | ICD-10-CM | POA: Insufficient documentation

## 2017-11-09 DIAGNOSIS — Z79899 Other long term (current) drug therapy: Secondary | ICD-10-CM | POA: Insufficient documentation

## 2017-11-09 DIAGNOSIS — Y999 Unspecified external cause status: Secondary | ICD-10-CM | POA: Insufficient documentation

## 2017-11-09 DIAGNOSIS — S025XXB Fracture of tooth (traumatic), initial encounter for open fracture: Secondary | ICD-10-CM

## 2017-11-09 LAB — BASIC METABOLIC PANEL
Anion gap: 5 (ref 5–15)
BUN: 7 mg/dL (ref 6–20)
CALCIUM: 7.9 mg/dL — AB (ref 8.9–10.3)
CO2: 21 mmol/L — ABNORMAL LOW (ref 22–32)
Chloride: 115 mmol/L — ABNORMAL HIGH (ref 101–111)
Creatinine, Ser: 0.61 mg/dL (ref 0.44–1.00)
GFR calc Af Amer: >60 mL/min (ref >60)
GFR calc non Af Amer: >60 mL/min (ref >60)
Glucose, Bld: 88 mg/dL (ref 65–99)
Potassium: 4.3 mmol/L (ref 3.5–5.1)
Sodium: 141 mmol/L (ref 135–145)

## 2017-11-09 LAB — CBG MONITORING, ED: Glucose-Capillary: 120 mg/dL — ABNORMAL HIGH (ref 65–99)

## 2017-11-09 MED ORDER — KETOROLAC TROMETHAMINE 30 MG/ML IJ SOLN
30.0000 mg | Freq: Once | INTRAMUSCULAR | Status: AC
  Administered 2017-11-09: 30 mg via INTRAVENOUS
  Filled 2017-11-09: qty 1

## 2017-11-09 MED ORDER — LEVETIRACETAM 1000 MG PO TABS: ORAL_TABLET | ORAL | 3 refills | Status: DC

## 2017-11-09 MED ORDER — LEVETIRACETAM IN NACL 1000 MG/100ML IV SOLN
1000.0000 mg | Freq: Once | INTRAVENOUS | Status: AC
  Administered 2017-11-09: 1000 mg via INTRAVENOUS
  Filled 2017-11-09: qty 100

## 2017-11-09 NOTE — ED Provider Notes (Signed)
[facility]-EMERGENCY DEPT Provider Note   CSN: [id number] Arrival date & time: 11/09/17  2033     History   Chief Complaint Chief Complaint  Patient presents with  . Seizures  . Facial Injury    cracked tooth and lip top and bottom superfical     HPI Brittany Hendrix is a 26 y.o. female.  She presents for evaluation of seizure.  She was at home tonight having just taken a nap, when she suddenly had a seizure.  This was witnessed by her boyfriend and her son.  She presents for evaluation by EMS.  Reportedly the seizure lasted about 45 seconds and she was postictal afterwards.  She injured her lips and her upper tooth, during the seizure.  Patient states she is taking her usual medications.  She denies recent illnesses including fever, cough, chest pain, change in bowel or urinary habits.  She saw her neurology provider today for general checkup and was to maintain her usual treatments of Keppra 1000 mg twice daily.  Patient states she had trouble sleeping last night because her son was awake for a while then she had an early appointment with the neurologist.  She works in a Advertising account planner.  There are no other known modifying factors.  HPI  Past Medical History:  Diagnosis Date  . Abortion history   . Headache(784.0)   . Seizures [facility])    Last seizure March 2017    Patient Active Problem List   Diagnosis Date Noted  . Nonintractable epilepsy without status epilepticus (HCC) 04/17/2017  . Vaginal delivery 12/27/2015  . Active labor at term 12/26/2015  . Obesity (BMI 35.0-39.9 without comorbidity) 12/25/2015  . Anemia affecting pregnancy 12/25/2015  . Seizure disorder in pregnancy, antepartum (HCC) 05/28/2015  . Supervision of high-risk pregnancy 05/28/2015  . 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 DILATION AND CURETTAGE OF UTERUS     abortion  . DILATION AND  EVACUATION N/A 10/04/2017   Procedure: DILATATION AND EVACUATION;  Surgeon: Silverio Lay, MD;  Location: [facility];  Service: Gynecology;  Laterality: N/A;     OB History    Gravida  6   Para  1   Term  1   Preterm  0   AB  4   Living  1     SAB  3   TAB  1   Ectopic  0   Multiple  0   Live Births  1            Home Medications    Prior to Admission medications   Medication Sig Start Date End Date Taking? Authorizing Provider  ibuprofen (ADVIL,MOTRIN) 600 MG tablet Take 1 tablet (600 mg total) by mouth every 6 (six) hours as needed. 10/04/17  Yes Rivard, Dois Davenport, MD  levETIRAcetam (KEPPRA) 1000 MG tablet Take one tablet twice daily. 11/09/17  Yes Nilda Riggs, NP  HYDROcodone-acetaminophen (NORCO) 5-325 MG tablet Take 1 tablet by mouth every 4 (four) hours as needed for moderate pain. 11/10/17   Mancel Bale, MD    Family History Family History  Problem Relation Age of Onset  . Diabetes Mother   . Asthma Brother     Social History Social History   Tobacco Use  . Smoking status: Former Smoker    Packs/day: 0.50    Years: 5.00    Pack years: 2.50    Types: Cigarettes  .  Smokeless tobacco: Never Used  Substance Use Topics  . Alcohol use: Yes    Comment: occasional, not while preg  . Drug use: Yes    Types: Marijuana    Comment: Jan 2019     Allergies   Patient has no known allergies.   Review of Systems Review of Systems  All other systems reviewed and are negative.    Physical Exam Updated Vital Signs BP (!) 104/45 Comment: informed the nurse of b/p  Pulse (!) 57   Temp 98.4 F (36.9 C) (Oral)   Resp (!) 24   LMP 07/15/2017 (Exact Date)   SpO2 99%   Physical Exam  Constitutional: She is oriented to person, place, and time. She appears well-developed and well-nourished. She appears distressed (She is uncomfortable).  HENT:  Head: Normocephalic and atraumatic.  Right Ear: External ear normal.  Left Ear: External ear  normal.  Transverse fracture left upper central incisor, exposing pulp but no bleeding.  No associated injuries in the oropharynx.  No trismus.  Eyes: Pupils are equal, round, and reactive to light. Conjunctivae and EOM are normal.  Neck: Normal range of motion and phonation normal. Neck supple.  Cardiovascular: Normal rate, regular rhythm and normal heart sounds.  Pulmonary/Chest: Effort normal and breath sounds normal. No respiratory distress. She exhibits no bony tenderness.  Abdominal: Soft. There is no tenderness.  Musculoskeletal: Normal range of motion.  Neurological: She is alert and oriented to person, place, and time. No cranial nerve deficit or sensory deficit. She exhibits normal muscle tone. Coordination normal.  No dysarthria, or aphasia.  Skin: Skin is warm, dry and intact.  Psychiatric: She has a normal mood and affect. Her behavior is normal. Judgment and thought content normal.  Nursing note and vitals reviewed.    ED Treatments / Results  Labs (all labs ordered are listed, but only abnormal results are displayed) Labs Reviewed  BASIC METABOLIC PANEL - Abnormal; Notable for the following components:      Result Value   Chloride 115 (*)    CO2 21 (*)    Calcium 7.9 (*)    All other components within normal limits  CBG MONITORING, ED - Abnormal; Notable for the following components:   Glucose-Capillary 120 (*)    All other components within normal limits  CBC WITH DIFFERENTIAL/PLATELET  CBC WITH DIFFERENTIAL/PLATELET  CBG MONITORING, ED  I-STAT BETA HCG BLOOD, ED ([facility], [facility], [facility] ONLY)  I-STAT BETA HCG BLOOD, ED ([facility], [facility], [facility] ONLY)    EKG None  Radiology No results found.  Procedures Procedures (including critical care time)  Medications Ordered in ED Medications  levETIRAcetam (KEPPRA) IVPB 1000 mg/100 mL premix (0 mg Intravenous Stopped 11/09/17 2306)  ketorolac (TORADOL) 30 MG/ML injection 30 mg (30 mg Intravenous Given 11/09/17 2122)     Initial  Impression / Assessment and Plan / ED Course  I have reviewed the triage vital signs and the nursing notes.  Pertinent labs & imaging results that were available during my care of the patient were reviewed by me and considered in my medical decision making (see chart for details).  Clinical Course as of Apr 19 0002  Thu Nov 09, 2017  2356 After multiple attempts for blood work, staff was only able to get a bmet done.  At this time the patient does not want to have additional attempts for blood draw.  I am in agreement with that and she should do okay without checking additional blood work.   [EW]  2358 Normal except chloride slightly elevated and CO2 slightly low.  Basic metabolic panel(!) [EW]    Clinical Course User Index [EW] Mancel BaleWentz, Eden Toohey, MD     Patient Vitals for the past 24 hrs:  BP Temp Temp src Pulse Resp SpO2  11/09/17 2317 (!) 104/45 - - (!) 57 (!) 24 99 %  11/09/17 2055 - - - - - 98 %  11/09/17 2047 - - - - - 98 %  11/09/17 2043 113/77 98.4 F (36.9 C) Oral 84 20 98 %    12:02 AM Reevaluation with update and discussion. After initial assessment and treatment, an updated evaluation reveals patient is comfortable here, no further complaints, no seizure activity.  Findings discussed and questions answered. Mancel BaleElliott Alaria Oconnor    Medical decision making-seizure likely related to lack of sleep.  Injury, contusion lip and dental fracture, without need for urgent intervention.  Doubt significant head injury, extremity injury, or significant metabolic instability.  Nursing Notes Reviewed/ Care Coordinated Applicable Imaging Reviewed Interpretation of Laboratory Data incorporated into ED treatment  The patient appears reasonably screened and/or stabilized for discharge and I doubt any other medical condition or other [facility] requiring further screening, evaluation, or treatment in the ED at this time prior to discharge.  Plan: Home Medications-continue usual medications; Home  Treatments-seizure precautions ,rest, cryotherapy; return here if the recommended treatment, does not improve the symptoms; Recommended follow up-neurology, dental and PCP follow-up 1-2 weeks.     Final Clinical Impressions(s) / ED Diagnoses   Final diagnoses:  Seizure [facility](HCC)  Open fracture of tooth, initial encounter    ED Discharge Orders        Ordered    HYDROcodone-acetaminophen (NORCO) 5-325 MG tablet  Every 4 hours PRN     11/10/17 0002       Mancel BaleWentz, Noemi Ishmael, MD 11/10/17 0004

## 2017-11-09 NOTE — Patient Instructions (Signed)
Continue Keppra thousand milligrams twice daily will refill Follow-up yearly and as needed

## 2017-11-09 NOTE — ED Notes (Signed)
Bed: [id number] Expected date:  Expected time:  Means of arrival:  Comments: EMS 27 yo female seizure for 25 seconds-swelling to lip-cracked front tooth

## 2017-11-09 NOTE — ED Triage Notes (Signed)
Patient arrives by EMS from home with complaints of seizure lasting 25-45 seconds-witnessed by boyfriend. Patient is on Keppra and compliant with medications-has not had a seizure in 2 years. Facial trauma-both lips swollen and left front tooth cracked. Patient is currently alert and oriented

## 2017-11-09 NOTE — Progress Notes (Signed)
I have reviewed and agreed above plan. 

## 2017-11-09 NOTE — Discharge Instructions (Addendum)
Call your neurologist to report the seizure today and make sure that they do not want to do additional testing, treatment or change your dosage of Keppra.  Continue to follow seizure precautions, do not drive for 6 months, avoid bathing alone or operating machinery.  Follow-up with a dentist as soon as possible for further treatment of the dental fracture.

## 2017-11-10 MED ORDER — HYDROCODONE-ACETAMINOPHEN 5-325 MG PO TABS: 1.0000 | ORAL_TABLET | ORAL | 0 refills | Status: DC | PRN

## 2017-11-10 NOTE — ED Notes (Signed)
Doctor dc blood orders

## 2018-03-04 ENCOUNTER — Encounter (HOSPITAL_COMMUNITY): Payer: Self-pay | Admitting: *Deleted

## 2018-03-04 ENCOUNTER — Other Ambulatory Visit: Payer: Self-pay

## 2018-03-04 ENCOUNTER — Inpatient Hospital Stay (HOSPITAL_COMMUNITY)
Admission: AD | Admit: 2018-03-04 | Discharge: 2018-03-04 | Disposition: A | Discharge status: Home / Self Care | Payer: Medicaid Other | Source: Ambulatory Visit | Attending: Obstetrics and Gynecology | Admitting: Obstetrics and Gynecology

## 2018-03-04 DIAGNOSIS — N911 Secondary amenorrhea: Secondary | ICD-10-CM

## 2018-03-04 DIAGNOSIS — N912 Amenorrhea, unspecified: Secondary | ICD-10-CM | POA: Insufficient documentation

## 2018-03-04 NOTE — MAU Provider Note (Signed)
[name] is a 27 y.o. [phone] who present to MAU today for pregnancy confirmation. She denies abdominal pain or vaginal bleeding.   BP 105/85 (BP Location: Right Arm)   Pulse 72   Temp 97.9 F (36.6 C)   Resp 18   LMP 07/15/2017 (Exact Date)   Breastfeeding? Unknown  CONSTITUTIONAL: Well-developed, well-nourished female in no acute distress.  CARDIOVASCULAR: Normal heart rate noted RESPIRATORY: Effort and breath sounds normal GASTROINTESTINAL:Soft, no distention noted.  No tenderness, rebound or guarding.  SKIN: Skin is warm and dry. No rash noted. Not diaphoretic. No erythema. No pallor. PSYCHIATRIC: Normal mood and affect. Normal behavior. Normal judgment and thought content.  MDM Medical screening exam complete Patient does not endorse any symptoms concerning for ectopic pregnancy or pregnancy related complication today.   A:  Amenorrhea  P: Discharge home Patient advised that she can present as a walk-in to [facility] for a pregnancy test M-Th between 8am-4pm or Friday between 8am -11am Reasons to return to MAU reviewed  Patient may return to MAU as needed or if her condition were to change or worsen  Katrinka BlazingSmith, [place], [place] 03/04/2018 9:31 PM

## 2018-03-04 NOTE — MAU Note (Signed)
MSE by MAU provider Ivonne AndrewV Smith CNM. Discharged by provider in triage.

## 2018-03-04 NOTE — Discharge Instructions (Signed)

## 2018-03-04 NOTE — MAU Note (Signed)
Pt reports faint + pregnancy test today. Denies pain and bleeding. History of miscarriage. Wanted to confim pregnancy.

## 2018-03-05 ENCOUNTER — Ambulatory Visit (INDEPENDENT_AMBULATORY_CARE_PROVIDER_SITE_OTHER): Payer: Medicaid Other | Admitting: *Deleted

## 2018-03-05 DIAGNOSIS — Z3202 Encounter for pregnancy test, result negative: Secondary | ICD-10-CM

## 2018-03-05 DIAGNOSIS — Z3200 Encounter for pregnancy test, result unknown: Secondary | ICD-10-CM

## 2018-03-05 LAB — POCT PREGNANCY, URINE: Preg Test, Ur: NEGATIVE

## 2018-03-05 NOTE — Progress Notes (Signed)
Here for pregnancy test. States had 2 tests at home and both had faint positive line. Informed her today our test was negative. LMP 02/12/18.  Would like blood test to confirm pregnancy . BHCG ordered.

## 2018-03-06 ENCOUNTER — Telehealth: Payer: Self-pay

## 2018-03-06 LAB — BETA HCG QUANT (REF LAB): hCG Quant: <1 m[IU]/mL

## 2018-03-06 NOTE — Telephone Encounter (Signed)
Pt called and stated that she was here yesterday and wanted to know her test results.  I called the pt and confirmed if she wanted to have her pregnancy hormone results.  Pt stated yes.  I informed the pt that her results show that she is not pregnant.  Pt stated understanding with no further questions.

## 2018-03-07 NOTE — Progress Notes (Signed)
Patient seen and assessed by nursing staff.  Agree with documentation and plan.  

## 2018-06-29 ENCOUNTER — Encounter (HOSPITAL_COMMUNITY): Payer: Self-pay | Admitting: Emergency Medicine

## 2018-06-29 ENCOUNTER — Emergency Department (HOSPITAL_COMMUNITY)
Admission: EM | Admit: 2018-06-29 | Discharge: 2018-06-29 | Disposition: A | Discharge status: Home / Self Care | Payer: Medicaid Other | Attending: Emergency Medicine | Admitting: Emergency Medicine

## 2018-06-29 DIAGNOSIS — W57XXXA Bitten or stung by nonvenomous insect and other nonvenomous arthropods, initial encounter: Secondary | ICD-10-CM | POA: Insufficient documentation

## 2018-06-29 DIAGNOSIS — S40869A Insect bite (nonvenomous) of unspecified upper arm, initial encounter: Secondary | ICD-10-CM | POA: Insufficient documentation

## 2018-06-29 DIAGNOSIS — Y939 Activity, unspecified: Secondary | ICD-10-CM | POA: Insufficient documentation

## 2018-06-29 DIAGNOSIS — Y998 Other external cause status: Secondary | ICD-10-CM | POA: Insufficient documentation

## 2018-06-29 DIAGNOSIS — S80869A Insect bite (nonvenomous), unspecified lower leg, initial encounter: Secondary | ICD-10-CM | POA: Insufficient documentation

## 2018-06-29 DIAGNOSIS — S20469A Insect bite (nonvenomous) of unspecified back wall of thorax, initial encounter: Secondary | ICD-10-CM | POA: Insufficient documentation

## 2018-06-29 DIAGNOSIS — Y929 Unspecified place or not applicable: Secondary | ICD-10-CM | POA: Insufficient documentation

## 2018-06-29 DIAGNOSIS — Z79899 Other long term (current) drug therapy: Secondary | ICD-10-CM | POA: Insufficient documentation

## 2018-06-29 DIAGNOSIS — F17210 Nicotine dependence, cigarettes, uncomplicated: Secondary | ICD-10-CM | POA: Insufficient documentation

## 2018-06-29 MED ORDER — HYDROCORTISONE 0.5 % EX CREA: 1.0000 "application " | TOPICAL_CREAM | Freq: Two times a day (BID) | CUTANEOUS | 0 refills | Status: DC

## 2018-06-29 NOTE — ED Provider Notes (Signed)
[facility] EMERGENCY DEPARTMENT Provider Note   CSN: [id number] Arrival date & time: 06/29/18  1023     History   Chief Complaint Chief Complaint  Patient presents with  . Insect Bite    HPI CAMEO Brittany Hendrix is a 27 y.o. female presenting today for concern of bug bites.  Patient states that she first noticed erythematous pruritic bumps on her arms and legs approximately 2 weeks ago.  She states that these resolved spontaneously however they reappeared 3 days ago.  Patient states that she has 2-3 bites per extremity and 1 to her back.  Patient denies facial involvement.  Patient denies associated pain however states that they are pruritic, moderate in intensity and relieved with scratching the areas.  Patient has not used any medication for her bumps.  Patient states that she has multiple spiders around her house however denies of seeing other insects, patient denies seeing any spiders touching her body.  Patient states that she is otherwise feeling well today, she states that she only presented to the ER because her coworkers requested that she be seen.  Patient denies cough, shortness of breath, facial swelling, feeling of throat closing, chest pain or any other concerns at this time.  She denies history of fever or drainage from the bumps.  HPI  Past Medical History:  Diagnosis Date  . Abortion history   . Headache(784.0)   . Seizures [facility])    Last seizure March 2017    Patient Active Problem List   Diagnosis Date Noted  . Nonintractable epilepsy without status epilepticus (HCC) 04/17/2017  . Vaginal delivery 12/27/2015  . Active labor at term 12/26/2015  . Obesity (BMI 35.0-39.9 without comorbidity) 12/25/2015  . Anemia affecting pregnancy 12/25/2015  . Seizure disorder in pregnancy, antepartum (HCC) 05/28/2015  . Supervision of high-risk pregnancy 05/28/2015  . 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 DILATION AND CURETTAGE OF UTERUS     abortion  . DILATION AND EVACUATION N/A 10/04/2017   Procedure: DILATATION AND EVACUATION;  Surgeon: Silverio Lay, MD;  Location: [facility];  Service: Gynecology;  Laterality: N/A;     OB History    Gravida  6   Para  1   Term  1   Preterm  0   AB  4   Living  1     SAB  3   TAB  1   Ectopic  0   Multiple  0   Live Births  1            Home Medications    Prior to Admission medications   Medication Sig Start Date End Date Taking? Authorizing Provider  hydrocortisone cream 0.5 % Apply 1 application topically 2 (two) times daily. Do NOT use on face/neck/body creases or genitalia. 06/29/18   Harlene Salts A, PA-C  levETIRAcetam (KEPPRA) 1000 MG tablet Take one tablet twice daily. 11/09/17   Nilda Riggs, NP    Family History Family History  Problem Relation Age of Onset  . Diabetes Mother   . Asthma Brother     Social History Social History   Tobacco Use  . Smoking status: Former Smoker    Packs/day: 0.50    Years: 5.00    Pack years: 2.50    Types: Cigarettes  . Smokeless tobacco: Never Used  Substance Use Topics  . Alcohol use: Yes    Comment: occasional,  not while preg  . Drug use: Yes    Types: Marijuana    Comment: Jan 2019     Allergies   Patient has no known allergies.   Review of Systems Review of Systems  Constitutional: Negative.  Negative for chills and fever.  HENT: Negative.  Negative for congestion, facial swelling, rhinorrhea, sore throat and voice change.   Respiratory: Negative.  Negative for cough, choking, chest tightness and shortness of breath.   Gastrointestinal: Negative.  Negative for abdominal pain, nausea and vomiting.  Musculoskeletal: Negative.  Negative for arthralgias and myalgias.  Skin: Positive for rash (Bug bites). Negative for wound.  Neurological: Negative.  Negative for dizziness, weakness, light-headedness,  numbness and headaches.   Physical Exam Updated Vital Signs BP (!) 113/56   Pulse 69   Temp 98.6 F (37 C)   Resp 18   LMP 06/24/2018   SpO2 100%   Physical Exam  Constitutional: She appears well-developed and well-nourished. No distress.  HENT:  Head: Normocephalic and atraumatic.  Right Ear: External ear normal.  Left Ear: External ear normal.  Nose: Nose normal.  Mouth/Throat: Uvula is midline, oropharynx is clear and moist and mucous membranes are normal.  The patient has normal phonation and is in control of secretions. No stridor.  Midline uvula without edema. Soft palate rises symmetrically. No tonsillar erythema, swelling or exudates. Tongue protrusion is normal, floor of mouth is soft. No trismus. No creptius on neck palpation. No gingival erythema or fluctuance noted. Mucus membranes moist.  Eyes: Pupils are equal, round, and reactive to light. Conjunctivae and EOM are normal.  Neck: Trachea normal, normal range of motion, full passive range of motion without pain and phonation normal. Neck supple. No spinous process tenderness and no muscular tenderness present. No tracheal deviation present.  Cardiovascular: Normal rate, regular rhythm and normal heart sounds.  Pulmonary/Chest: Effort normal and breath sounds normal. No respiratory distress.  Abdominal: Soft. There is no tenderness. There is no rigidity, no rebound and no guarding.  Musculoskeletal: Normal range of motion.  Patient moving all extremities spontaneously, no acute distress.  Neurological: She is alert. GCS eye subscore is 4. GCS verbal subscore is 5. GCS motor subscore is 6.  Speech is clear and goal oriented, follows commands Major Cranial nerves without deficit, no facial droop Sensation normal to light touch Moves extremities without ataxia, coordination intact Normal gait  Skin: Skin is warm, dry and intact. Capillary refill takes less than 2 seconds.  Patient with multiple singular erythematous  pruritic maculopapular bumps to arms, legs and back.  They are not linear, they are blanchable, no drainage, fluctuance, induration or increased warmth.  Appearance of mosquito bites.  Psychiatric: She has a normal mood and affect. Her behavior is normal.   ED Treatments / Results  Labs (all labs ordered are listed, but only abnormal results are displayed) Labs Reviewed - No data to display  EKG None  Radiology No results found.  Procedures Procedures (including critical care time)  Medications Ordered in ED Medications - No data to display  Initial Impression / Assessment and Plan / ED Course  I have reviewed the triage vital signs and the nursing notes.  Pertinent labs & imaging results that were available during my care of the patient were reviewed by me and considered in my medical decision making (see chart for details).    27 year old otherwise healthy female presenting today for pruritic bites.  Bites are sparse and random on the arms, legs and  back, nonlinear, non-burrowing, single erythematous pruritic bites. Patient denies any difficulty breathing or swallowing.  Pt has a patent airway without stridor and is handling secretions without difficulty; no angioedema. No blisters, no pustules, no warmth, no draining sinus tracts, no superficial abscesses, no bullous impetigo, no vesicles, no desquamation, no target lesions with dusky purpura or a central bulla. Not tender to touch. No concern for superimposed infection. No concern for SJS, TEN, TSS, tick borne illness, syphilis or other life-threatening condition.   Patient has been discharged with hydrocortisone cream, low potency.  No bites to face.  Patient informed not to use this on body creases, genitalia or face.  Patient informed to elevate to use for less than 1 week to avoid skin thinning and only for itching.  Patient also informed that she may use over-the-counter Benadryl as directed on the packaging to help with her  itching.  Informed to follow-up with PCP within 1 week.  I have asked patient to have exterminator evaluate her house for potential causes of bug bites.  At discharge patient is afebrile, not tachycardic, not hypotensive, well-appearing and in no acute distress.  At this time there does not appear to be any evidence of an acute emergency medical condition and the patient appears stable for discharge with appropriate outpatient follow up. Diagnosis was discussed with patient who verbalizes understanding of care plan and is agreeable to discharge. I have discussed return precautions with patient who verbalizes understanding of return precautions. Patient strongly encouraged to follow-up with their PCP within one week. All questions answered.  Note: Portions of this report may have been transcribed using voice recognition software. Every effort was made to ensure accuracy; however, inadvertent computerized transcription errors may still be present. Final Clinical Impressions(s) / ED Diagnoses   Final diagnoses:  Insect bite, unspecified site, initial encounter    ED Discharge Orders         Ordered    hydrocortisone cream 0.5 %  2 times daily     06/29/18 1145           Elizabeth Palau 06/29/18 1200    Doug Sou, MD 06/29/18 1650

## 2018-06-29 NOTE — ED Triage Notes (Signed)
Pt here for eval of bug bites, states her bosses wont let her go to work with them. She had them 2 weeks ago and they came back. States she sees spiders in her house but no bed bugs.

## 2018-06-29 NOTE — Discharge Instructions (Signed)
You have been diagnosed today with insect bites.  At this time there does not appear to be the presence of an emergent medical condition, however there is always the potential for conditions to change. Please read and follow the below instructions.  Please return to the Emergency Department immediately for any new or worsening symptoms. Please be sure to follow up with your Primary Care Provider next week regarding your visit today; please call their office to schedule an appointment even if you are feeling better for a follow-up visit. You may use the hydrocortisone cream as prescribed to help with itching.  Do not use this medication for longer than 1 week because it may thin your skin.  Do not use this medication on your face, genitalia or in your body creases such as your armpits or elbows.  You may also use over-the-counter antihistamine such as Benadryl as directed on the packaging to help with itching. Please have an exterminator evaluate your house for bugs that may be causing your bug bites.  Contact a health care provider if: You have more redness, swelling, or pain in the bite area. You have fluid, blood, or pus coming from the bite area. The bite area feels warm to the touch. You have a fever. Get help right away if: You have joint pain. You have a rash. You have shortness of breath. You feel unusually tired or sleepy. You have neck pain. You have a headache. You have unusual weakness. You have chest pain. You have nausea, vomiting, or pain in the abdomen.  Please read the additional information packets attached to your discharge summary.  Do not take your medicine if  develop an itchy rash, swelling in your mouth or lips, or difficulty breathing.

## 2018-08-13 ENCOUNTER — Telehealth: Payer: Self-pay | Admitting: Student

## 2018-08-13 ENCOUNTER — Encounter: Payer: Self-pay | Admitting: Student

## 2018-08-13 ENCOUNTER — Ambulatory Visit: Payer: Self-pay | Admitting: Student

## 2018-08-13 NOTE — Telephone Encounter (Signed)
Called the patient to rescheduled missed appointment. The patient was unavailable, left a message and sending a reminder letter.

## 2018-10-01 ENCOUNTER — Emergency Department (HOSPITAL_COMMUNITY)
Admission: EM | Admit: 2018-10-01 | Discharge: 2018-10-01 | Disposition: A | Discharge status: Home / Self Care | Payer: Medicaid Other | Attending: Emergency Medicine | Admitting: Emergency Medicine

## 2018-10-01 ENCOUNTER — Other Ambulatory Visit: Payer: Self-pay

## 2018-10-01 DIAGNOSIS — M79601 Pain in right arm: Secondary | ICD-10-CM | POA: Insufficient documentation

## 2018-10-01 DIAGNOSIS — Z5321 Procedure and treatment not carried out due to patient leaving prior to being seen by health care provider: Secondary | ICD-10-CM | POA: Insufficient documentation

## 2018-10-01 NOTE — ED Notes (Signed)
Pt came out of room and stated she wasn't able to wait any longer and she would just come back if her pain got worse. Pt was unable to wait for this RN to get PA.

## 2018-10-01 NOTE — ED Triage Notes (Signed)
Patient reports R wrist and forearm pain that goes to her elbow x 1 week - states she injured it at work and pain haws worsened since.

## 2018-10-11 ENCOUNTER — Other Ambulatory Visit: Payer: Self-pay | Admitting: Nurse Practitioner

## 2018-11-09 ENCOUNTER — Ambulatory Visit: Payer: Medicaid Other | Admitting: Nurse Practitioner

## 2018-11-12 ENCOUNTER — Ambulatory Visit (INDEPENDENT_AMBULATORY_CARE_PROVIDER_SITE_OTHER): Payer: Self-pay | Admitting: Neurology

## 2018-11-12 ENCOUNTER — Encounter: Payer: Self-pay | Admitting: Neurology

## 2018-11-12 ENCOUNTER — Other Ambulatory Visit: Payer: Self-pay

## 2018-11-12 DIAGNOSIS — G40909 Epilepsy, unspecified, not intractable, without status epilepticus: Secondary | ICD-10-CM

## 2018-11-12 MED ORDER — LEVETIRACETAM 1000 MG PO TABS: ORAL_TABLET | ORAL | 4 refills | Status: DC

## 2018-11-12 NOTE — Progress Notes (Signed)
[facility]  PATIENT: Brittany Hendrix DOB: 01/06/1991   REASON FOR VISIT:follow up for seizure disorder HISTORY FROM: Patient    HISTORY OF PRESENT ILLNESS: Brittany Hendrix is a [age] years old female, with epilepsy disorder, currently taking Keppra 500 in the morning, 1000 mg in the evening. Last seizure was in May 2014.   She had seizures since she was 7114-28 years old.  All seizure has similar seminology. She has an aura before those seizures, she feels drowsy and sometimes nauseated, lasting few minutes, followed by loss of conciousness, whole body shaking, then post ictal confusion usually last 20-30 minutes..   She has injured herself in the face and on her leg  during seizures in the past. She was never on any anti-epilepsy medications, until after she had a recurrent seizure in May 2012, she was put on Levetiracetam 500 mg b.i.d.   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  her bloodwork tested positive for marijuana.   For a while, she was noncompliant with her medications, also concerned about the medication costs, she continue have recurrent seizures.   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  I reviewed laboratory, normal CMP, mild anemia 10.8,  UPDATE Sept 24 2018:YY Most recent seizure was on Sept 10 2018, she has aura of body jumpy, body nervous shaky, she gasp for air, LOC, she bite her lips followed by headache, body achy pain  UPDATE 4/18/2019[name], 28 year old female returns for follow-up with history of seizure disorder.  Her last seizure was April 03, 2017.  She is currently on Keppra thousand milligrams twice daily.  She denies missing doses of her medication.  She denies any side effects.  She returns for reevaluation.  She is back to driving  Virtual Visit via Video  I connected with Brittany Hendrix on 11/12/18 at  by video and verified that I am speaking with the correct person using two identifiers.   I discussed the limitations, risks, security and privacy concerns of performing an evaluation and management service by video and the availability of in person appointments. I also discussed with the patient that there may be a patient responsible charge related to this service. The patient expressed understanding and agreed to proceed.   History of Present Illness:  She is currently taking Keppra 1000 mg twice a day tolerating the medication well, last seizure was on November 10, 2018, was treated at emergency room, she lost her left front teeth due to seizure  Observations/Objective: I have reviewed problem lists, medications, allergies.  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    Normal EEG in the past.    Last seizure event was in April 2020  Refilled her Keppra 1000 mg twice a day    Follow Up Instructions:  In 1 year with Brittany Hendrix    I discussed the assessment and treatment plan with the patient. The patient was provided an opportunity to ask questions and all were answered. The patient agreed with the plan and demonstrated an understanding of the instructions.   The patient was advised to call back or seek an in-person evaluation if the symptoms worsen or if the condition fails to improve as anticipated.  I provided 20 minutes of non-face-to-face time during this encounter.   Levert Feinstein, MD

## 2018-12-31 ENCOUNTER — Encounter (HOSPITAL_COMMUNITY): Payer: Self-pay | Admitting: Emergency Medicine

## 2018-12-31 ENCOUNTER — Other Ambulatory Visit: Payer: Self-pay

## 2018-12-31 ENCOUNTER — Emergency Department (HOSPITAL_COMMUNITY)
Admission: EM | Admit: 2018-12-31 | Discharge: 2018-12-31 | Disposition: A | Discharge status: Home / Self Care | Payer: Medicaid Other | Attending: Emergency Medicine | Admitting: Emergency Medicine

## 2018-12-31 DIAGNOSIS — Z79899 Other long term (current) drug therapy: Secondary | ICD-10-CM | POA: Insufficient documentation

## 2018-12-31 DIAGNOSIS — N898 Other specified noninflammatory disorders of vagina: Secondary | ICD-10-CM | POA: Insufficient documentation

## 2018-12-31 DIAGNOSIS — N939 Abnormal uterine and vaginal bleeding, unspecified: Secondary | ICD-10-CM | POA: Diagnosis not present

## 2018-12-31 DIAGNOSIS — Z87891 Personal history of nicotine dependence: Secondary | ICD-10-CM | POA: Insufficient documentation

## 2018-12-31 DIAGNOSIS — R1030 Lower abdominal pain, unspecified: Secondary | ICD-10-CM

## 2018-12-31 LAB — WET PREP, GENITAL
Clue Cells Wet Prep HPF POC: NONE SEEN
Sperm: NONE SEEN
Trich, Wet Prep: NONE SEEN
WBC, Wet Prep HPF POC: NONE SEEN
Yeast Wet Prep HPF POC: NONE SEEN

## 2018-12-31 LAB — CBC
HCT: 41.1 % (ref 36.0–46.0)
Hemoglobin: 13.2 g/dL (ref 12.0–15.0)
MCH: 31.1 pg (ref 26.0–34.0)
MCHC: 32.1 g/dL (ref 30.0–36.0)
MCV: 96.9 fL (ref 80.0–100.0)
Platelets: 248 10*3/uL (ref 150–400)
RBC: 4.24 MIL/uL (ref 3.87–5.11)
RDW: 12.3 % (ref 11.5–15.5)
WBC: 8.6 10*3/uL (ref 4.0–10.5)
nRBC: 0 % (ref 0.0–0.2)

## 2018-12-31 LAB — URINALYSIS, ROUTINE W REFLEX MICROSCOPIC
Bilirubin Urine: NEGATIVE
Glucose, UA: NEGATIVE mg/dL
Hgb urine dipstick: NEGATIVE
Ketones, ur: NEGATIVE mg/dL
Leukocytes,Ua: NEGATIVE
Nitrite: NEGATIVE
Protein, ur: NEGATIVE mg/dL
Specific Gravity, Urine: 1.024 (ref 1.005–1.030)
pH: 6 (ref 5.0–8.0)

## 2018-12-31 LAB — COMPREHENSIVE METABOLIC PANEL
ALT: 16 U/L (ref 0–44)
AST: 16 U/L (ref 15–41)
Albumin: 4.4 g/dL (ref 3.5–5.0)
Alkaline Phosphatase: 70 U/L (ref 38–126)
Anion gap: 8 (ref 5–15)
BUN: 8 mg/dL (ref 6–20)
CO2: 26 mmol/L (ref 22–32)
Calcium: 8.9 mg/dL (ref 8.9–10.3)
Chloride: 106 mmol/L (ref 98–111)
Creatinine, Ser: 0.76 mg/dL (ref 0.44–1.00)
GFR calc Af Amer: >60 mL/min (ref >60)
GFR calc non Af Amer: >60 mL/min (ref >60)
Glucose, Bld: 104 mg/dL — ABNORMAL HIGH (ref 70–99)
Potassium: 3.5 mmol/L (ref 3.5–5.1)
Sodium: 140 mmol/L (ref 135–145)
Total Bilirubin: <0.1 mg/dL — ABNORMAL LOW (ref 0.3–1.2)
Total Protein: 7.8 g/dL (ref 6.5–8.1)

## 2018-12-31 LAB — I-STAT BETA HCG BLOOD, ED (MC, WL, AP ONLY): I-stat hCG, quantitative: <5 m[IU]/mL (ref <5)

## 2018-12-31 LAB — LIPASE, BLOOD: Lipase: 27 U/L (ref 11–51)

## 2018-12-31 MED ORDER — ACETAMINOPHEN 325 MG PO TABS
650.0000 mg | ORAL_TABLET | Freq: Once | ORAL | Status: AC
  Administered 2018-12-31: 23:00:00 650 mg via ORAL
  Filled 2018-12-31: qty 2

## 2018-12-31 NOTE — Discharge Instructions (Addendum)
You have been seen today for abdominal pain and a light menstrual period. Please read and follow all provided instructions.   1. Medications: tylenol for cramps, usual home medications 2. Treatment: rest, drink plenty of fluids 3. Follow Up: Please follow up with your primary doctor and OBGYN in 2 days for discussion of your diagnoses and further evaluation after today's visit; if you do not have a primary care doctor use the resource guide provided to find one; Please return to the ER for any new or worsening symptoms. Please obtain all of your results from medical records or have your doctors office obtain the results - share them with your doctor - you should be seen at your doctors office. Call today to arrange your follow up.   Take medications as prescribed. Please review all of the medicines and only take them if you do not have an allergy to them. Return to the emergency room for worsening condition or new concerning symptoms. Follow up with your regular doctor. If you don't have a regular doctor use one of the numbers below to establish a primary care doctor.  Please be aware that if you are taking birth control pills, taking other prescriptions, ESPECIALLY ANTIBIOTICS may make the birth control ineffective - if this is the case, either do not engage in sexual activity or use alternative methods of birth control such as condoms until you have finished the medicine and your family doctor says it is OK to restart them. If you are on a blood thinner such as COUMADIN, be aware that any other medicine that you take may cause the coumadin to either work too much, or not enough - you should have your coumadin level rechecked in next 7 days if this is the case.  ?  It is also a possibility that you have an allergic reaction to any of the medicines that you have been prescribed - Everybody reacts differently to medications and while MOST people have no trouble with most medicines, you may have a reaction  such as nausea, vomiting, rash, swelling, shortness of breath. If this is the case, please stop taking the medicine immediately and contact your physician.  ?  You should return to the ER if you develop severe or worsening symptoms.   Emergency Department Resource Guide 1) Find a Doctor and Pay Out of Pocket Although you won't have to find out who is covered by your insurance plan, it is a good idea to ask around and get recommendations. You will then need to call the office and see if the doctor you have chosen will accept you as a new patient and what types of options they offer for patients who are self-pay. Some doctors offer discounts or will set up payment plans for their patients who do not have insurance, but you will need to ask so you aren't surprised when you get to your appointment.  2) Contact Your Local Health Department Not all health departments have doctors that can see patients for sick visits, but many do, so it is worth a call to see if yours does. If you don't know where your local health department is, you can check in your phone book. The CDC also has a tool to help you locate your state's health department, and many state websites also have listings of all of their local health departments.  3) Find a [facility] If your illness is not likely to be very severe or complicated, you may want to try  a walk in clinic. These are popping up all over the country in pharmacies, drugstores, and shopping centers. They're usually staffed by nurse practitioners or physician assistants that have been trained to treat common illnesses and complaints. They're usually fairly quick and inexpensive. However, if you have serious medical issues or chronic medical problems, these are probably not your best option.  No Primary Care Doctor: Call Health Connect at  [phone] - they can help you locate a primary care doctor that  accepts your insurance, provides certain services, etc. Physician  Referral Service- [phone]  Chronic Pain Problems: Organization         Address  Phone   Notes  [facility]  [phone] Patients need to be referred by their primary care doctor.   Medication Assistance: Organization         Address  Phone   Notes  [facility] Medication [facility] [address] [place], [place] [address] [phone] --Must be a resident of [facility] -- Must have NO insurance coverage whatsoever (no Medicaid/ Medicare, etc.) -- The pt. MUST have a primary care doctor that directs their care regularly and follows them in the community   [facility]  [phone]   [employer]  [phone]    Agencies that provide inexpensive medical care: Organization         Address  Phone   Notes  [place]  [phone]   [facility]    [phone]   [facility] [place], [place] [address] [phone]   [place] [address], [place] [phone]   [facility]    [phone]   [facility]    [phone]   [place] and [place] [address], [place] Phone:  [phone], Fax:  [phone] Hours of Operation:  9 am - 6 pm, M-F.  Also accepts Medicaid/Medicare and self-pay.  [facility] for [address], [address], [place] Phone: [phone], Fax: [phone]. Hours of Operation:  8:30 am - 5:30 pm, M-F.  Also accepts Medicaid and self-pay.  [facility] [place] [address] [place] Phone: [phone]   [place], [place], [place] [phone], Ext. [phone] Mondays & Thursdays: 7-9 AM.  First 15 patients are seen on a first come, first serve basis.    [place] Providers:  Organization         Address  Phone   Notes  [facility] [address], [place] [phone] Also accepts self-pay patients.  [facility] [address], [place]  [phone]   [place], [address], [place] [phone]   [facility] [address] [place] [phone]   Lucianne Lei [address], [place]   [phone] Only accepts [place] [facility] [place] patients after they have their name applied to their card.   Self-Pay (no insurance) in [facility]:  Organization         Address  Phone   Notes  Sickle Cell Patients, [facility] [place] [phone]   [facility] [place] [phone]   [facility] [place]  [place], [address], [place] [phone]   [facility]/Dr. Osei-Bonsu  [address] [place] or [address], [place] [phone] Phone number for both [place] and [place] locations is the same.  Urgent Medical and [facility] [address], [place] [phone]   [facility] [address] [place] or [address] [phone]   [facility] [address] [place] [phone], phone; [phone], fax Sees patients 1st and 3rd Saturday of every month.  Must not qualify for public or private insurance (i.e. Medicaid, Medicare, [place] Health Choice, Veterans' Benefits)  Household income should be no more than 200% of the poverty level The clinic cannot treat you if you are pregnant or think you are pregnant  Sexually transmitted diseases are not treated at the clinic.

## 2018-12-31 NOTE — ED Provider Notes (Signed)
[facility]-EMERGENCY DEPT Provider Note   CSN: [id number] Arrival date & time: 12/31/18  1556    History   Chief Complaint No chief complaint on file. Abdominal pain  HPI Brittany Hendrix is a 28 y.o. female with a PMH of abortion history, headaches, and seizures presenting with intermittent mild lower abdominal pain onset today. Patient describes pain as a mild cramp. Patient states she typically has cramps with her period, but this abdominal pain is more mild than her typical cramps. Patient states nothing makes symptoms better or worse. Patient states she has not taken any medications. Patient states she is concerned that she may be pregnant since she skipped a period last month. Patient states she finished her menstrual period today with very light vaginal bleeding. LMP 12/26/2018. Patient states she has taken 6 pregnancy tests and they were all negative. Patient reports intermittent nausea, but denies vomiting. Patient reports intermittent brown diarrhea over the last few days. Patient reports only 1 episode of diarrhea over the last 24 hours and states diarrhea does not happen daily. Patient reports vaginal discharge. Patient denies dysuria, frequency, or hematuria. Patient states she is only sexually active with her husband, but does not use protection. Patient denies fever, chills, cough, congestion, sore throat, sick contacts, or recent travel.      HPI  Past Medical History:  Diagnosis Date  . Abortion history   . Headache(784.0)   . Seizures [facility](HCC)    Last seizure March 2017    Patient Active Problem List   Diagnosis Date Noted  . Nonintractable epilepsy without status epilepticus (HCC) 04/17/2017  . Vaginal delivery 12/27/2015  . Active labor at term 12/26/2015  . Obesity (BMI 35.0-39.9 without comorbidity) 12/25/2015  . Anemia affecting pregnancy 12/25/2015  . Seizure disorder in pregnancy, antepartum (HCC) 05/28/2015  . Supervision of high-risk  pregnancy 05/28/2015  . 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 DILATION AND CURETTAGE OF UTERUS     abortion  . DILATION AND EVACUATION N/A 10/04/2017   Procedure: DILATATION AND EVACUATION;  Surgeon: Silverio Layivard, Sandra, MD;  Location: [facility];  Service: Gynecology;  Laterality: N/A;     OB History    Gravida  6   Para  1   Term  1   Preterm  0   AB  4   Living  1     SAB  3   TAB  1   Ectopic  0   Multiple  0   Live Births  1            Home Medications    Prior to Admission medications   Medication Sig Start Date End Date Taking? Authorizing Provider  hydrocortisone cream 0.5 % Apply 1 application topically 2 (two) times daily. Do NOT use on face/neck/body creases or genitalia. 06/29/18   Harlene SaltsMorelli, Brandon A, PA-C  levETIRAcetam (KEPPRA) 1000 MG tablet TAKE ONE TABLET BY MOUTH TWICE DAILY 11/12/18   Levert FeinsteinYan, Yijun, MD    Family History Family History  Problem Relation Age of Onset  . Diabetes Mother   . Asthma Brother     Social History Social History   Tobacco Use  . Smoking status: Former Smoker    Packs/day: 0.50    Years: 5.00    Pack years: 2.50    Types: Cigarettes  . Smokeless tobacco: Never Used  Substance Use Topics  . Alcohol use: Yes  Comment: occasional, not while preg  . Drug use: Yes    Types: Marijuana    Comment: Jan 2019     Allergies   Patient has no known allergies.   Review of Systems Review of Systems  Constitutional: Negative for activity change, appetite change, chills, fever and unexpected weight change.  HENT: Negative for congestion, rhinorrhea and sore throat.   Eyes: Negative for visual disturbance.  Respiratory: Negative for cough and shortness of breath.   Cardiovascular: Negative for chest pain.  Gastrointestinal: Positive for abdominal pain, diarrhea and nausea. Negative for constipation and vomiting.  Endocrine:  Negative for polydipsia, polyphagia and polyuria.  Genitourinary: Positive for menstrual problem, vaginal bleeding and vaginal discharge. Negative for dysuria, flank pain, frequency and vaginal pain.  Musculoskeletal: Negative for back pain.  Skin: Negative for rash.  Allergic/Immunologic: Negative for immunocompromised state.  Psychiatric/Behavioral: The patient is not nervous/anxious.     Physical Exam Updated Vital Signs BP 120/70 (BP Location: Right Arm)   Pulse 61   Temp 98.5 F (36.9 C) (Oral)   Resp 18   LMP 12/26/2018   SpO2 100%   Physical Exam Vitals signs and nursing note reviewed. Exam conducted with a chaperone present.  Constitutional:      General: She is not in acute distress.    Appearance: She is well-developed. She is not diaphoretic.  HENT:     Head: Normocephalic and atraumatic.     Nose: Nose normal. No rhinorrhea.     Mouth/Throat:     Mouth: Mucous membranes are moist.     Pharynx: No posterior oropharyngeal erythema.  Eyes:     Extraocular Movements: Extraocular movements intact.     Conjunctiva/sclera: Conjunctivae normal.     Pupils: Pupils are equal, round, and reactive to light.  Neck:     Musculoskeletal: Normal range of motion and neck supple.  Cardiovascular:     Rate and Rhythm: Normal rate and regular rhythm.     Heart sounds: Normal heart sounds. No murmur. No friction rub. No gallop.   Pulmonary:     Effort: Pulmonary effort is normal. No respiratory distress.     Breath sounds: Normal breath sounds. No wheezing or rales.  Abdominal:     General: Bowel sounds are normal. There is no distension.     Palpations: Abdomen is soft. Abdomen is not rigid. There is no mass.     Tenderness: There is no abdominal tenderness. There is no right CVA tenderness, left CVA tenderness, guarding or rebound.     Hernia: No hernia is present.     Comments: No pain upon palpation of abdomen.   Genitourinary:    Vagina: No signs of injury and foreign  body. Vaginal discharge present. No erythema or bleeding.     Cervix: Dilated. Discharge present. No cervical motion tenderness, friability, lesion, erythema or cervical bleeding.     Uterus: Normal.      Adnexa: Right adnexa normal and left adnexa normal.       Right: No mass or tenderness.         Left: No mass or tenderness.       Comments: Thin clear discharge noted on exam. Os minimally dilated on exam. No bleeding on exam. No CMT. Musculoskeletal: Normal range of motion.  Skin:    General: Skin is warm.     Findings: No rash.  Neurological:     Mental Status: She is alert and oriented to person, place, and time.  ED Treatments / Results  Labs (all labs ordered are listed, but only abnormal results are displayed) Labs Reviewed  COMPREHENSIVE METABOLIC PANEL - Abnormal; Notable for the following components:      Result Value   Glucose, Bld 104 (*)    Total Bilirubin <0.1 (*)    All other components within normal limits  URINALYSIS, ROUTINE W REFLEX MICROSCOPIC - Abnormal; Notable for the following components:   APPearance HAZY (*)    All other components within normal limits  WET PREP, GENITAL  LIPASE, BLOOD  CBC  I-STAT BETA HCG BLOOD, ED ([facility], [facility], [facility] ONLY)  GC/CHLAMYDIA PROBE AMP ([place] NOT [facility]    EKG None  Radiology No results found.  Procedures Procedures (including critical care time)  Medications Ordered in ED Medications  acetaminophen (TYLENOL) tablet 650 mg (650 mg Oral Given 12/31/18 2300)     Initial Impression / Assessment and Plan / ED Course  I have reviewed the triage vital signs and the nursing notes.  Pertinent labs & imaging results that were available during my care of the patient were reviewed by me and considered in my medical decision making (see chart for details).  Clinical Course as of Dec 30 2329  Mon Dec 31, 2018  2314 Wet prep is negative.   Wet prep, genital [AH]  2315 CBC is unremarkable.  CBC [AH]  2315 UA is  negative.  Urinalysis, Routine w reflex microscopic(!) [AH]  2315 CMP reveals mild hyperglycemia at 104 and low total bilirubin, otherwise unremarkable.  Comprehensive metabolic panel(!) [AH]    Clinical Course User Index [AH] Leretha DykesHernandez, Jacalynn Buzzell P, PA-C      Patient presents with abdominal pain. Patient is nontoxic, nonseptic appearing, in no apparent distress.  Patient's pain and other symptoms adequately managed in emergency department.  Oral fluids encouraged.  Labs, imaging and vitals reviewed.  Patient does not meet the SIRS or Sepsis criteria.  On repeat exam patient does not have a surgical abdomin and there are no peritoneal signs.  Patient does not have any pain upon palpation of abdomen. No indication of appendicitis, bowel obstruction, bowel perforation, cholecystitis, diverticulitis, PID or ectopic pregnancy. Exam appears to reveal os minimally dilated, but no vaginal bleeding. Minimal vaginal discharge noted. No bleeding noted. Beta hCG levels are negative. Vaginal bleeding resolved today prior to arrival. Discussed findings with Dr. Donnald GarrePfeiffer. Will advise patient follow up with OBGYN. Wet prep is negative. Gonorrhea/chlamydia results are pending and patient understands she will receive a call if results are positive. Patient understands she will have to notify partners if results are positive. Patient did not wish to be tested for syphilis and HIV at this time. Do not suspect ovarian torsion at this time due to history and physical exam. Patient is stable in no acute distress. Patient discharged home with symptomatic treatment and given strict instructions for follow-up with their primary care physician and OBGYN.  I have also discussed reasons to return immediately to the ER.  Patient expresses understanding and agrees with plan.  Findings and plan of care discussed with supervising physician Dr. Donnald GarrePfeiffer.  Final Clinical Impressions(s) / ED Diagnoses   Final diagnoses:  Lower abdominal  pain  Vaginal bleeding, abnormal    ED Discharge Orders    None       Leretha DykesHernandez, Austan Nicholl P, [place] JerseyPA-C 12/31/18 2332    Arby BarrettePfeiffer, Marcy, MD 01/01/19 1440

## 2018-12-31 NOTE — ED Triage Notes (Signed)
Pt reports that she came on her period 9 days late and took home pregnancy test was negative but having lots of pain and very light bleeding. Reports didn't have a cycle for May. Hx miscarriages.

## 2018-12-31 NOTE — ED Notes (Signed)
Bed: WTR7 Expected date:  Expected time:  Means of arrival:  Comments: 

## 2019-01-01 ENCOUNTER — Telehealth (INDEPENDENT_AMBULATORY_CARE_PROVIDER_SITE_OTHER): Payer: Self-pay

## 2019-01-01 DIAGNOSIS — N939 Abnormal uterine and vaginal bleeding, unspecified: Secondary | ICD-10-CM

## 2019-01-01 LAB — GC/CHLAMYDIA PROBE AMP (~~LOC~~) NOT AT ARMC
Chlamydia: NEGATIVE
Neisseria Gonorrhea: NEGATIVE

## 2019-01-01 NOTE — Telephone Encounter (Signed)
The patient called in with questions about the visit at the ER. She wants to be sure she's not pregnant or experiencing a miscarriage. Didn't feel confident with the information provided at ER. She also stated she is experiencing abdominal pain. She has a scheduled appointment but would like a prescription for pain until she can complete the visit.

## 2019-01-01 NOTE — Telephone Encounter (Signed)
Returned pt's call regarding her concern that she is pregnant or having a miscarriage. Pt stated her period was 9 days late and was light and when she went to the ED she was told that her cervix was slightly open without any signs of tissue or a miscarriage.  Advised pt that her hormone levels in her blood were negative and that's the most accurate way to tell but she states that the hormone levels are always low in her pregnancies.  Advised pt that if she was concerned that she was pregnant then she could wait 1-2 weeks and test again and her hormone levels, if she is pregnant, should be high enough to register on a home pregnancy test.  Pt states the doctor at the ED told her to follow up with her OB/GYN and she thought it was because she could have been pregnant and that's why her cervix was dilated.  Advised pt that as she was bleeding from her period, her cervix would be slightly dilated and that, per the MD note, the pt was having a "menstrual problem" and that would be something that she would follow up here for. Pt verbalized understanding.

## 2019-01-02 ENCOUNTER — Encounter: Payer: Self-pay | Admitting: *Deleted

## 2019-01-14 ENCOUNTER — Ambulatory Visit: Payer: Medicaid Other | Admitting: Obstetrics and Gynecology

## 2019-01-14 ENCOUNTER — Encounter: Payer: Self-pay | Admitting: Obstetrics and Gynecology

## 2019-01-15 NOTE — Progress Notes (Signed)
Patient did not keep her GYN appointment for 01/14/2019.  Durene Romans MD Attending Center for [employer])

## 2019-01-29 ENCOUNTER — Encounter: Payer: Self-pay | Admitting: *Deleted

## 2019-02-27 ENCOUNTER — Emergency Department (HOSPITAL_COMMUNITY)
Admission: EM | Admit: 2019-02-27 | Discharge: 2019-02-27 | Disposition: A | Discharge status: Home / Self Care | Payer: Medicaid Other | Attending: Emergency Medicine | Admitting: Emergency Medicine

## 2019-02-27 ENCOUNTER — Other Ambulatory Visit: Payer: Self-pay

## 2019-02-27 ENCOUNTER — Encounter (HOSPITAL_COMMUNITY): Payer: Self-pay | Admitting: Obstetrics and Gynecology

## 2019-02-27 DIAGNOSIS — Z20828 Contact with and (suspected) exposure to other viral communicable diseases: Secondary | ICD-10-CM | POA: Insufficient documentation

## 2019-02-27 DIAGNOSIS — Z79899 Other long term (current) drug therapy: Secondary | ICD-10-CM | POA: Diagnosis not present

## 2019-02-27 DIAGNOSIS — F17210 Nicotine dependence, cigarettes, uncomplicated: Secondary | ICD-10-CM | POA: Diagnosis not present

## 2019-02-27 DIAGNOSIS — R05 Cough: Secondary | ICD-10-CM | POA: Diagnosis present

## 2019-02-27 DIAGNOSIS — J069 Acute upper respiratory infection, unspecified: Secondary | ICD-10-CM | POA: Diagnosis not present

## 2019-02-27 LAB — SARS CORONAVIRUS 2 (TAT 6-24 HRS): SARS Coronavirus 2: NEGATIVE

## 2019-02-27 NOTE — ED Provider Notes (Signed)
[facility] DEPT Provider Note   CSN: [id number] Arrival date & time: 02/27/19  9833    History   Chief Complaint Chief Complaint  Patient presents with  . Nausea  . Emesis  . Dysmenorrhea  . Nasal Congestion    HPI Brittany Hendrix is a 28 y.o. female.     28 year old female presents with complaint of nonproductive cough, nasal congestion, nausea and vomiting.  Patient states that her symptoms started yesterday after exposure to her nephew who has had a cold recently.  Patient reports nausea and vomiting after seeing dog poop on her floor today, denies abdominal pain.  Menstrual cycle started 1 week ago, reports she is still passing clots, denies feeling weak or dizzy.  Patient would like to be checked for STDs as part of routine screening, no infection concerns today.  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 No other complaints or concerns.      Past Medical History:  Diagnosis Date  . Abortion history   . Headache(784.0)   . Seizures [facility])    Last seizure March 2017    Patient Active Problem List   Diagnosis Date Noted  . Nonintractable epilepsy without status epilepticus ([place]) 04/17/2017  . Vaginal delivery 12/27/2015  . Active labor at term 12/26/2015  . Obesity (BMI 35.0-39.9 without comorbidity) 12/25/2015  . Anemia affecting pregnancy 12/25/2015  . Seizure disorder in pregnancy, antepartum ([place]) 05/28/2015  . Supervision of high-risk pregnancy 05/28/2015  . 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 DILATION AND CURETTAGE OF UTERUS     abortion  . DILATION AND EVACUATION N/A 10/04/2017   Procedure: DILATATION AND EVACUATION;  Surgeon: Delsa Bern, MD;  Location: [facility];  Service: Gynecology;  Laterality: N/A;     OB History    Gravida  6   Para  1   Term  1   Preterm  0   AB  4   Living  1     SAB  3   TAB  1   Ectopic  0   Multiple  0   Live Births  1            Home Medications    Prior to Admission medications   Medication Sig Start Date End Date Taking? Authorizing Provider  hydrocortisone cream 0.5 % Apply 1 application topically 2 (two) times daily. Do NOT use on face/neck/body creases or genitalia. 06/29/18   Nuala Alpha A, PA-C  levETIRAcetam (KEPPRA) 1000 MG tablet TAKE ONE TABLET BY MOUTH TWICE DAILY 11/12/18   Marcial Pacas, MD    Family History Family History  Problem Relation Age of Onset  . Diabetes Mother   . Asthma Brother     Social History Social History   Tobacco Use  . Smoking status: Current Every Day Smoker    Packs/day: 0.50    Years: 5.00    Pack years: 2.50    Types: Cigarettes  . Smokeless tobacco: Never Used  Substance Use Topics  . Alcohol use: Yes    Comment: occasional, not while preg  . Drug use: Yes    Types: Marijuana     Allergies   Patient has no known allergies.   Review of Systems Review of Systems  Constitutional: Negative for fever.  HENT: Positive for congestion and rhinorrhea. Negative for sneezing.   Respiratory: Positive  for cough.   Gastrointestinal: Positive for nausea and vomiting. Negative for abdominal pain.  Genitourinary: Negative for dysuria, frequency, pelvic pain, urgency and vaginal discharge.  Musculoskeletal: Negative for arthralgias and myalgias.  Skin: Negative for rash and wound.  Allergic/Immunologic: Negative for immunocompromised state.  Neurological: Negative for dizziness and weakness.  Hematological: Negative for adenopathy.  Psychiatric/Behavioral: Negative for confusion.  All other systems reviewed and are negative.    Physical Exam Updated Vital Signs BP 121/78 (BP Location: Left Arm)   Pulse 81   Temp 98.3 F (36.8 C) (Oral)   Resp 15   Ht 5\' 4"  (1.626 m)   Wt 92.1 kg   LMP 02/22/2019 (Approximate)   SpO2 96%   BMI 34.84 kg/m   Physical  Exam Vitals signs and nursing note reviewed.  Constitutional:      General: She is not in acute distress.    Appearance: She is well-developed. She is not diaphoretic.  HENT:     Head: Normocephalic and atraumatic.     Right Ear: Tympanic membrane and ear canal normal.     Left Ear: Tympanic membrane and ear canal normal.  Neck:     Musculoskeletal: Neck supple. No muscular tenderness.  Cardiovascular:     Rate and Rhythm: Normal rate and regular rhythm.     Heart sounds: Normal heart sounds.  Pulmonary:     Effort: Pulmonary effort is normal.     Breath sounds: Normal breath sounds.  Lymphadenopathy:     Cervical: No cervical adenopathy.  Skin:    General: Skin is warm and dry.     Findings: No erythema or rash.  Neurological:     Mental Status: She is alert and oriented to person, place, and time.  Psychiatric:        Behavior: Behavior normal.      ED Treatments / Results  Labs (all labs ordered are listed, but only abnormal results are displayed) Labs Reviewed  NOVEL CORONAVIRUS, NAA (HOSPITAL ORDER, SEND-OUT TO REF LAB)    EKG None  Radiology No results found.  Procedures Procedures (including critical care time)  Medications Ordered in ED Medications - No data to display   Initial Impression / Assessment and Plan / ED Course  I have reviewed the triage vital signs and the nursing notes.  Pertinent labs & imaging results that were available during my care of the patient were reviewed by me and considered in my medical decision making (see chart for details).  Clinical Course as of Feb 26 1142  Wed Feb 27, 2019  [id number] 28 year old female presents with complaint of nonproductive cough, congestion, runny nose for the past few days.  Also reports nausea and vomiting today after finding dog poop on the floor.  Patient works in [employer], requires a note to build to return to work today, request testing for [employer].  Outpatient test will be sent,  advised to quarantine until she knows results.  Consider COVID versus viral URI.  Patient also requests testing for STDs, no symptoms of PID, vitals stable.  Patient referred to [facility] for follow-up, did have negative gonorrhea chlamydia test 2 months ago.   [LM]  1143 Beckie SaltsLorena B Goldie was evaluated in Emergency Department on 02/27/2019 for the symptoms described in the history of present illness. She was evaluated in the context of the global COVID-19 pandemic, which necessitated consideration that the patient might be at risk for infection with the SARS-CoV-2 virus that  causes COVID-19. Institutional protocols and algorithms that pertain to the evaluation of patients at risk for COVID-19 are in a state of rapid change based on information released by regulatory bodies including the CDC and federal and state organizations. These policies and algorithms were followed during the patient's care in the ED.     [LM]    Clinical Course User Index [LM] Jeannie FendMurphy, Benno Brensinger A, PA-C      Final Clinical Impressions(s) / ED Diagnoses   Final diagnoses:  Viral upper respiratory tract infection    ED Discharge Orders    None       Jeannie FendMurphy, Prudie Guthridge A, PA-C 02/27/19 1143    Vanetta MuldersZackowski, Scott, MD 03/01/19 [phone]

## 2019-02-27 NOTE — ED Triage Notes (Signed)
Pt reports she has been having N/V and nasal congestion the past few days.  Pt reports she was around her nephew who she thinks "got her sick" Pt reports she has also been having difficult periods with heavy clotting.  Pt reports she wants to be checked for STDs and COVID.

## 2019-02-27 NOTE — Discharge Instructions (Signed)
Quarantine at home until you know your test results.  If your test is negative, you may return to work.  Follow up with the Center for [facility], address and phone number listed above. They may have walk-in hours this afternoon, call to confirm. You may also follow up with the health department walk in clinic for STD testing.

## 2019-03-31 ENCOUNTER — Encounter: Payer: Self-pay | Admitting: Physician Assistant

## 2019-03-31 ENCOUNTER — Other Ambulatory Visit: Payer: Self-pay

## 2019-03-31 ENCOUNTER — Ambulatory Visit
Admission: EM | Admit: 2019-03-31 | Discharge: 2019-03-31 | Disposition: A | Discharge status: Home / Self Care | Payer: Medicaid Other | Attending: Physician Assistant | Admitting: Physician Assistant

## 2019-03-31 DIAGNOSIS — Z3202 Encounter for pregnancy test, result negative: Secondary | ICD-10-CM

## 2019-03-31 DIAGNOSIS — N898 Other specified noninflammatory disorders of vagina: Secondary | ICD-10-CM | POA: Diagnosis not present

## 2019-03-31 LAB — POCT URINE PREGNANCY: Preg Test, Ur: NEGATIVE

## 2019-03-31 MED ORDER — FLUCONAZOLE 150 MG PO TABS: 150.0000 mg | ORAL_TABLET | Freq: Every day | ORAL | 0 refills | Status: DC

## 2019-03-31 MED ORDER — METRONIDAZOLE 500 MG PO TABS: 500.0000 mg | ORAL_TABLET | Freq: Two times a day (BID) | ORAL | 0 refills | Status: DC

## 2019-03-31 NOTE — ED Provider Notes (Signed)
[facility]    CSN: [id number] Arrival date & time: 03/31/19  1155      History   Chief Complaint Chief Complaint  Patient presents with  . Vaginal Discharge    HPI Brittany Hendrix is a 28 y.o. female.   28 year old female comes in for 2-week history of vaginal discharge, vaginal itching/irritation, vaginal odor.  She tried Monistat with some relief of discharge and odor.  However, she started her cycle, and has had worsening of symptoms.  She denies abdominal pain, nausea, vomiting.  Denies urinary symptoms such as frequency, dysuria, hematuria.  She is sexually active, recently with 2 partners, no consistent condom use.  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  LMP 03/29/2019.     Past Medical History:  Diagnosis Date  . Abortion history   . Headache(784.0)   . Seizures [facility])    Last seizure March 2017    Patient Active Problem List   Diagnosis Date Noted  . Nonintractable epilepsy without status epilepticus ([place]) 04/17/2017  . Vaginal delivery 12/27/2015  . Active labor at term 12/26/2015  . Obesity (BMI 35.0-39.9 without comorbidity) 12/25/2015  . Anemia affecting pregnancy 12/25/2015  . Seizure disorder in pregnancy, antepartum ([place]) 05/28/2015  . Supervision of high-risk pregnancy 05/28/2015  . 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 DILATION AND CURETTAGE OF UTERUS     abortion  . DILATION AND EVACUATION N/A 10/04/2017   Procedure: DILATATION AND EVACUATION;  Surgeon: Delsa Bern, MD;  Location: [facility];  Service: Gynecology;  Laterality: N/A;    OB History    Gravida  6   Para  1   Term  1   Preterm  0   AB  4   Living  1     SAB  3   TAB  1   Ectopic  0   Multiple  0   Live Births  1            Home Medications    Prior to Admission  medications   Medication Sig Start Date End Date Taking? Authorizing Provider  fluconazole (DIFLUCAN) 150 MG tablet Take 1 tablet (150 mg total) by mouth daily. Take second dose 72 hours later if symptoms still persists. 03/31/19   Tasia Catchings, Djuan Talton V, PA-C  hydrocortisone cream 0.5 % Apply 1 application topically 2 (two) times daily. Do NOT use on face/neck/body creases or genitalia. 06/29/18   Nuala Alpha A, PA-C  levETIRAcetam (KEPPRA) 1000 MG tablet TAKE ONE TABLET BY MOUTH TWICE DAILY Patient taking differently: Take 1,000 mg by mouth 2 (two) times daily.  11/12/18   Marcial Pacas, MD  metroNIDAZOLE (FLAGYL) 500 MG tablet Take 1 tablet (500 mg total) by mouth 2 (two) times daily. 03/31/19   Ok Edwards, PA-C    Family History Family History  Problem Relation Age of Onset  . Diabetes Mother   . Asthma Brother     Social History Social History   Tobacco Use  . Smoking status: Current Every Day Smoker    Packs/day: 0.50    Years: 5.00    Pack years: 2.50    Types: Cigarettes  . Smokeless tobacco: Never Used  Substance Use Topics  . Alcohol use: Yes    Comment: occasional, not while preg  . Drug use: Yes  Types: Marijuana     Allergies   Patient has no known allergies.   Review of Systems Review of Systems  Reason unable to perform ROS: See HPI as above.     Physical Exam Triage Vital Signs ED Triage Vitals  Enc Vitals Group     BP 03/31/19 1211 121/76     Pulse Rate 03/31/19 1211 79     Resp 03/31/19 1211 16     Temp 03/31/19 1211 98.1 F (36.7 C)     Temp Source 03/31/19 1211 Oral     SpO2 03/31/19 1211 98 %     Weight --      Height --      Head Circumference --      Peak Flow --      Pain Score 03/31/19 1212 1     Pain Loc --      Pain Edu? --      Excl. in [place]? --    No data found.  Updated Vital Signs BP 121/76 (BP Location: Right Arm)   Pulse 79   Temp 98.1 F (36.7 C) (Oral)   Resp 16   LMP 03/29/2019   SpO2 98%   Physical Exam Constitutional:       General: She is not in acute distress.    Appearance: She is well-developed. She is not ill-appearing, toxic-appearing or diaphoretic.  HENT:     Head: Normocephalic and atraumatic.  Eyes:     Conjunctiva/sclera: Conjunctivae normal.     Pupils: Pupils are equal, round, and reactive to light.  Cardiovascular:     Rate and Rhythm: Normal rate and regular rhythm.     Heart sounds: Normal heart sounds. No murmur. No friction rub. No gallop.   Pulmonary:     Effort: Pulmonary effort is normal.     Breath sounds: Normal breath sounds. No wheezing or rales.  Abdominal:     General: Bowel sounds are normal.     Palpations: Abdomen is soft.     Tenderness: There is no abdominal tenderness. There is no right CVA tenderness, left CVA tenderness, guarding or rebound.  Skin:    General: Skin is warm and dry.  Neurological:     Mental Status: She is alert and oriented to person, place, and time.  Psychiatric:        Behavior: Behavior normal.        Judgment: Judgment normal.      UC Treatments / Results  Labs (all labs ordered are listed, but only abnormal results are displayed) Labs Reviewed  POCT URINE PREGNANCY  CERVICOVAGINAL ANCILLARY ONLY    EKG   Radiology No results found.  Procedures Procedures (including critical care time)  Medications Ordered in UC Medications - No data to display  Initial Impression / Assessment and Plan / UC Course  I have reviewed the triage vital signs and the nursing notes.  Pertinent labs & imaging results that were available during my care of the patient were reviewed by me and considered in my medical decision making (see chart for details).    Patient was treated empirically for BV and yeast. Diflucan and flagyl as directed. Cytology sent, patient will be contacted with any positive results that require additional treatment. Patient to refrain from sexual activity for the next 7 days. Return precautions given.   Final Clinical  Impressions(s) / UC Diagnoses   Final diagnoses:  Vaginal discharge   ED Prescriptions    Medication Sig Dispense Auth. Provider  metroNIDAZOLE (FLAGYL) 500 MG tablet Take 1 tablet (500 mg total) by mouth 2 (two) times daily. 14 tablet Tishawn Friedhoff V, PA-C   fluconazole (DIFLUCAN) 150 MG tablet Take 1 tablet (150 mg total) by mouth daily. Take second dose 72 hours later if symptoms still persists. 2 tablet Threasa Alpha, PA-C 03/31/19 1236

## 2019-03-31 NOTE — Discharge Instructions (Signed)
You were treated empirically for BV and yeast. Start flagyl and diflucan as directed. Cytology sent, you will be contacted with any positive results that requires further treatment. Refrain from sexual activity and alcohol use for the next 7 days. Monitor for any worsening of symptoms, fever, abdominal pain, nausea, vomiting, to follow up for reevaluation.

## 2019-03-31 NOTE — ED Triage Notes (Signed)
Per pt she was having discharge with irritation and slight odor  so she purchased OTC medication Monistat, symptoms subsided some odor completely gone. Pt started her period with clots. Pt said hx of miscarriages. Pt says possible STD or BV.

## 2019-04-04 LAB — CERVICOVAGINAL ANCILLARY ONLY
Chlamydia: NEGATIVE
Neisseria Gonorrhea: NEGATIVE
Trichomonas: NEGATIVE

## 2019-05-25 ENCOUNTER — Emergency Department (HOSPITAL_COMMUNITY)
Admission: EM | Admit: 2019-05-25 | Discharge: 2019-05-25 | Disposition: A | Discharge status: Home / Self Care | Payer: Medicaid Other | Attending: Emergency Medicine | Admitting: Emergency Medicine

## 2019-05-25 ENCOUNTER — Other Ambulatory Visit: Payer: Self-pay

## 2019-05-25 DIAGNOSIS — F1210 Cannabis abuse, uncomplicated: Secondary | ICD-10-CM | POA: Insufficient documentation

## 2019-05-25 DIAGNOSIS — F17210 Nicotine dependence, cigarettes, uncomplicated: Secondary | ICD-10-CM | POA: Diagnosis not present

## 2019-05-25 DIAGNOSIS — R569 Unspecified convulsions: Secondary | ICD-10-CM | POA: Diagnosis not present

## 2019-05-25 LAB — URINALYSIS, ROUTINE W REFLEX MICROSCOPIC
Bilirubin Urine: NEGATIVE
Glucose, UA: NEGATIVE mg/dL
Ketones, ur: 20 mg/dL — AB
Leukocytes,Ua: NEGATIVE
Nitrite: NEGATIVE
Protein, ur: 30 mg/dL — AB
Specific Gravity, Urine: 1.016 (ref 1.005–1.030)
pH: 6 (ref 5.0–8.0)

## 2019-05-25 LAB — WET PREP, GENITAL
Sperm: NONE SEEN
Trich, Wet Prep: NONE SEEN
WBC, Wet Prep HPF POC: NONE SEEN
Yeast Wet Prep HPF POC: NONE SEEN

## 2019-05-25 LAB — PREGNANCY, URINE: Preg Test, Ur: NEGATIVE

## 2019-05-25 NOTE — ED Triage Notes (Signed)
Patient reports seizure at work. Patient reports it was 1 minute full body. Patient is alert and oriented and not post-ictal at this time.  Patient denies hitting her head or falling.  Patient has a hx of seizures reported.  Patient states she takes Keppra.

## 2019-05-25 NOTE — ED Provider Notes (Signed)
[facility]-EMERGENCY DEPT Provider Note   CSN: [id number] Arrival date & time: 05/25/19  1152     History   Chief Complaint Chief Complaint  Patient presents with  . Seizures    HPI Brittany Hendrix is a 28 y.o. female.  She has a history of seizure disorder.  She said she had a seizure while at work that was reportedly about 1 minute tonic-clonic.  She says she feels a little sore now but otherwise does not think she got injured.  She said she feels back to baseline and would like to be discharged soon.  She says she usually gets 1 or 2 seizures a year and has not missed any medications.  She said she been under a lot of stress and did not sleep well last night.     The history is provided by the patient.  Seizures Seizure activity on arrival: no   Seizure type:  Grand mal Initial focality:  Unable to specify Episode characteristics: generalized shaking   Return to baseline: yes   Severity:  Unable to specify Duration:  1 minute Timing:  Once Progression:  Resolved Context: decreased sleep, emotional upset and stress   Recent head injury:  No recent head injuries PTA treatment:  None History of seizures: yes     Past Medical History:  Diagnosis Date  . Abortion history   . Headache(784.0)   . Seizures [facility])    Last seizure March 2017    Patient Active Problem List   Diagnosis Date Noted  . Nonintractable epilepsy without status epilepticus (HCC) 04/17/2017  . Vaginal delivery 12/27/2015  . Active labor at term 12/26/2015  . Obesity (BMI 35.0-39.9 without comorbidity) 12/25/2015  . Anemia affecting pregnancy 12/25/2015  . Seizure disorder in pregnancy, antepartum (HCC) 05/28/2015  . Supervision of high-risk pregnancy 05/28/2015  . 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 DILATION AND CURETTAGE OF UTERUS     abortion  . DILATION AND EVACUATION  N/A 10/04/2017   Procedure: DILATATION AND EVACUATION;  Surgeon: Silverio Lay, MD;  Location: [facility];  Service: Gynecology;  Laterality: N/A;     OB History    Gravida  6   Para  1   Term  1   Preterm  0   AB  4   Living  1     SAB  3   TAB  1   Ectopic  0   Multiple  0   Live Births  1            Home Medications    Prior to Admission medications   Medication Sig Start Date End Date Taking? Authorizing Provider  fluconazole (DIFLUCAN) 150 MG tablet Take 1 tablet (150 mg total) by mouth daily. Take second dose 72 hours later if symptoms still persists. 03/31/19   Cathie Hoops, Amy V, PA-C  hydrocortisone cream 0.5 % Apply 1 application topically 2 (two) times daily. Do NOT use on face/neck/body creases or genitalia. 06/29/18   Harlene Salts A, PA-C  levETIRAcetam (KEPPRA) 1000 MG tablet TAKE ONE TABLET BY MOUTH TWICE DAILY Patient taking differently: Take 1,000 mg by mouth 2 (two) times daily.  11/12/18   Levert Feinstein, MD  metroNIDAZOLE (FLAGYL) 500 MG tablet Take 1 tablet (500 mg total) by mouth 2 (two) times daily. 03/31/19   Belinda Fisher, PA-C    Family History Family History  Problem Relation  Age of Onset  . Diabetes Mother   . Asthma Brother     Social History Social History   Tobacco Use  . Smoking status: Current Every Day Smoker    Packs/day: 0.50    Years: 5.00    Pack years: 2.50    Types: Cigarettes  . Smokeless tobacco: Never Used  Substance Use Topics  . Alcohol use: Yes    Comment: occasional, not while preg  . Drug use: Yes    Types: Marijuana     Allergies   Patient has no known allergies.   Review of Systems Review of Systems  Constitutional: Negative for fever.  HENT: Negative for sore throat.   Eyes: Negative for visual disturbance.  Respiratory: Negative for shortness of breath.   Cardiovascular: Negative for chest pain.  Gastrointestinal: Negative for abdominal pain.  Genitourinary: Negative for dysuria.  Musculoskeletal: Negative  for neck pain.  Skin: Negative for rash.  Neurological: Positive for seizures.     Physical Exam Updated Vital Signs BP 113/69 (BP Location: Left Arm)   Pulse 68   Temp 98.8 F (37.1 C) (Oral)   Resp 19   LMP 05/21/2019   SpO2 98%   Physical Exam Vitals signs and nursing note reviewed.  Constitutional:      General: She is not in acute distress.    Appearance: She is well-developed.  HENT:     Head: Normocephalic and atraumatic.  Eyes:     Conjunctiva/sclera: Conjunctivae normal.  Neck:     Musculoskeletal: Neck supple.  Cardiovascular:     Rate and Rhythm: Normal rate and regular rhythm.     Heart sounds: No murmur.  Pulmonary:     Effort: Pulmonary effort is normal. No respiratory distress.     Breath sounds: Normal breath sounds.  Abdominal:     Palpations: Abdomen is soft.     Tenderness: There is no abdominal tenderness.  Musculoskeletal: Normal range of motion.        General: No deformity or signs of injury.  Skin:    General: Skin is warm and dry.     Capillary Refill: Capillary refill takes less than 2 seconds.  Neurological:     General: No focal deficit present.     Mental Status: She is alert and oriented to person, place, and time.     Sensory: No sensory deficit.     Motor: No weakness.      ED Treatments / Results  Labs (all labs ordered are listed, but only abnormal results are displayed) Labs Reviewed  WET PREP, GENITAL - Abnormal; Notable for the following components:      Result Value   Clue Cells Wet Prep HPF POC PRESENT (*)    All other components within normal limits  URINALYSIS, ROUTINE W REFLEX MICROSCOPIC - Abnormal; Notable for the following components:   Hgb urine dipstick LARGE (*)    Ketones, ur 20 (*)    Protein, ur 30 (*)    Bacteria, UA RARE (*)    All other components within normal limits  PREGNANCY, URINE  GC/CHLAMYDIA PROBE AMP ([place]) NOT AT [facility]    EKG EKG Interpretation  Date/Time:  Saturday May 25 2019 12:25:00 EDT Ventricular Rate:  69 PR Interval:    QRS Duration: 88 QT Interval:  396 QTC Calculation: 425 R Axis:   48 Text Interpretation: Sinus rhythm similar to prior 5/15 Confirmed by Aletta Edouard [phone]) on 05/25/2019 12:43:35 PM   Radiology No results  found.  Procedures Procedures (including critical care time)  Medications Ordered in ED Medications - No data to display   Initial Impression / Assessment and Plan / ED Course  I have reviewed the triage vital signs and the nursing notes.  Pertinent labs & imaging results that were available during my care of the patient were reviewed by me and considered in my medical decision making (see chart for details).  Clinical Course as of May 25 937  Sat May 25, 2019  [id number] 28 year old female with history of seizures had a seizure while at work.  Differential includes breakthrough seizure, subtherapeutic medication, infection.  Her fingerstick at the scene was 139.  She denies any cough diarrhea urinary symptoms.  Taking Keppra.  Normal neuro now do not think she needs any advanced imaging or involved testing.  We will check a urine and UCG.   [MB]    Clinical Course User Index [MB] Terrilee FilesButler, Michael C, MD   Before discharge patient asked the nurse if she could self-test for gc/chalm. Ordered test and patient self aquired the sample.      Final Clinical Impressions(s) / ED Diagnoses   Final diagnoses:  Seizure [facility](HCC)    ED Discharge Orders    None       Terrilee FilesButler, Michael C, MD 05/26/19 [phone]

## 2019-05-25 NOTE — Discharge Instructions (Signed)
You were seen in the emergency department after a seizure.  You had returned to your normal level of alertness here and are being discharged to follow-up with your doctor.  It will be important for you to continue your regular seizure medications.  Return to the emergency department if any concerns.

## 2019-05-28 LAB — GC/CHLAMYDIA PROBE AMP (~~LOC~~) NOT AT ARMC
Chlamydia: NEGATIVE
Neisseria Gonorrhea: NEGATIVE

## 2019-07-20 ENCOUNTER — Encounter: Payer: Self-pay | Admitting: Emergency Medicine

## 2019-07-20 ENCOUNTER — Other Ambulatory Visit: Payer: Self-pay

## 2019-07-20 ENCOUNTER — Ambulatory Visit
Admission: EM | Admit: 2019-07-20 | Discharge: 2019-07-20 | Disposition: A | Discharge status: Home / Self Care | Payer: Medicaid Other | Attending: Emergency Medicine | Admitting: Emergency Medicine

## 2019-07-20 DIAGNOSIS — R5381 Other malaise: Secondary | ICD-10-CM | POA: Diagnosis not present

## 2019-07-20 DIAGNOSIS — Z20828 Contact with and (suspected) exposure to other viral communicable diseases: Secondary | ICD-10-CM

## 2019-07-20 DIAGNOSIS — R1111 Vomiting without nausea: Secondary | ICD-10-CM | POA: Diagnosis not present

## 2019-07-20 NOTE — Discharge Instructions (Signed)
Your COVID test is pending - it is important to quarantine / isolate at home until your results are back. °If you test positive and would like further evaluation for persistent or worsening symptoms, you may schedule an E-visit or virtual (video) visit throughout the  MyChart app or website. ° °PLEASE NOTE: If you develop severe chest pain or shortness of breath please go to the ER or call 9-1-1 for further evaluation --> DO NOT schedule electronic or virtual visits for this. °Please call our office for further guidance / recommendations as needed. °

## 2019-07-20 NOTE — ED Provider Notes (Signed)
[facility]    CSN: [id number] Arrival date & time: 07/20/19  1206      History   Chief Complaint Chief Complaint  Patient presents with  . Emesis    HPI Brittany Hendrix is a 28 y.o. female with history of seizures presenting for Covid testing.  States that she had single episode of emesis this morning without blood, though "looked yellow ".  States this is happened now 3 times over the last month without associated nausea, precipitating event.  Patient currently sexually active, though states her partner has "had somewhat on the side "who tested positive for Covid.  Unsure of date of Covid testing.  Patient's boyfriend and patient have largely been asymptomatic, though patient has been experiencing malaise, subjective fever and chills for the last 2 days.  Patient states she took a pregnancy test at that time: Negative.  Patient declining pregnancy STD test today.  Past Medical History:  Diagnosis Date  . Abortion history   . Headache(784.0)   . Seizures [facility](HCC)    Last seizure March 2017    Patient Active Problem List   Diagnosis Date Noted  . Nonintractable epilepsy without status epilepticus (HCC) 04/17/2017  . Vaginal delivery 12/27/2015  . Active labor at term 12/26/2015  . Obesity (BMI 35.0-39.9 without comorbidity) 12/25/2015  . Anemia affecting pregnancy 12/25/2015  . Seizure disorder in pregnancy, antepartum (HCC) 05/28/2015  . Supervision of high-risk pregnancy 05/28/2015  . 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 DILATION AND CURETTAGE OF UTERUS     abortion  . DILATION AND EVACUATION N/A 10/04/2017   Procedure: DILATATION AND EVACUATION;  Surgeon: Silverio Layivard, Sandra, MD;  Location: [facility];  Service: Gynecology;  Laterality: N/A;    OB History    Gravida  6   Para  1   Term  1   Preterm  0   AB  4   Living  1     SAB  3   TAB  1   Ectopic    0   Multiple  0   Live Births  1            Home Medications    Prior to Admission medications   Medication Sig Start Date End Date Taking? Authorizing Provider  levETIRAcetam (KEPPRA) 1000 MG tablet TAKE ONE TABLET BY MOUTH TWICE DAILY Patient taking differently: Take 1,000 mg by mouth 2 (two) times daily.  11/12/18   Levert FeinsteinYan, Yijun, MD    Family History Family History  Problem Relation Age of Onset  . Diabetes Mother   . Asthma Brother     Social History Social History   Tobacco Use  . Smoking status: Current Every Day Smoker    Packs/day: 0.50    Years: 5.00    Pack years: 2.50    Types: Cigarettes  . Smokeless tobacco: Never Used  Substance Use Topics  . Alcohol use: Yes    Comment: occasional, not while preg  . Drug use: Yes    Types: Marijuana     Allergies   Patient has no known allergies.   Review of Systems Review of Systems  Constitutional: Positive for chills. Negative for activity change, appetite change, fatigue and fever.  HENT: Negative for ear pain, sinus pain, sore throat and voice change.   Eyes: Negative for pain, redness and visual disturbance.  Respiratory: Negative for cough and shortness of breath.  Cardiovascular: Negative for chest pain and palpitations.  Gastrointestinal: Positive for vomiting. Negative for abdominal pain and diarrhea.  Musculoskeletal: Negative for arthralgias and myalgias.  Skin: Negative for rash and wound.  Neurological: Negative for syncope and headaches.     Physical Exam Triage Vital Signs ED Triage Vitals  Enc Vitals Group     BP 07/20/19 1239 105/67     Pulse Rate 07/20/19 1239 65     Resp 07/20/19 1239 18     Temp 07/20/19 1239 97.8 F (36.6 C)     Temp Source 07/20/19 1239 Temporal     SpO2 07/20/19 1239 98 %     Weight --      Height --      Head Circumference --      Peak Flow --      Pain Score 07/20/19 1241 0     Pain Loc --      Pain Edu? --      Excl. in [place]? --    No data  found.  Updated Vital Signs BP 105/67 (BP Location: Left Arm)   Pulse 65   Temp 97.8 F (36.6 C) (Temporal)   Resp 18   LMP 07/20/2019   SpO2 98%   Visual Acuity Right Eye Distance:   Left Eye Distance:   Bilateral Distance:    Right Eye Near:   Left Eye Near:    Bilateral Near:     Physical Exam Constitutional:      General: She is not in acute distress.    Appearance: She is obese. She is not toxic-appearing.  HENT:     Head: Normocephalic and atraumatic.     Mouth/Throat:     Mouth: Mucous membranes are moist.     Pharynx: Oropharynx is clear.  Eyes:     General: No scleral icterus.    Conjunctiva/sclera: Conjunctivae normal.     Pupils: Pupils are equal, round, and reactive to light.  Cardiovascular:     Rate and Rhythm: Normal rate.  Pulmonary:     Effort: Pulmonary effort is normal.  Abdominal:     General: Abdomen is flat. Bowel sounds are normal.     Tenderness: There is no abdominal tenderness.  Musculoskeletal:        General: Normal range of motion.     Right lower leg: No edema.     Left lower leg: No edema.  Skin:    Capillary Refill: Capillary refill takes less than 2 seconds.     Coloration: Skin is not jaundiced or pale.     Findings: No rash.  Neurological:     General: No focal deficit present.     Mental Status: She is alert and oriented to person, place, and time.      UC Treatments / Results  Labs (all labs ordered are listed, but only abnormal results are displayed) Labs Reviewed  NOVEL CORONAVIRUS, NAA    EKG   Radiology No results found.  Procedures Procedures (including critical care time)  Medications Ordered in UC Medications - No data to display  Initial Impression / Assessment and Plan / UC Course  I have reviewed the triage vital signs and the nursing notes.  Pertinent labs & imaging results that were available during my care of the patient were reviewed by me and considered in my medical decision making (see  chart for details).     Patient afebrile, nontoxic, with SpO2 98%.  Covid PCR pending.  Patient to quarantine  until results are back.  We will continue supportive management, commence bland diet and monitor hydration status given single episode of emesis with good p.o. intake since.  Return precautions discussed, patient verbalized understanding and is agreeable to plan. Final Clinical Impressions(s) / UC Diagnoses   Final diagnoses:  Malaise  Vomiting without nausea, intractability of vomiting not specified, unspecified vomiting type     Discharge Instructions     Your COVID test is pending - it is important to quarantine / isolate at home until your results are back. If you test positive and would like further evaluation for persistent or worsening symptoms, you may schedule an E-visit or virtual (video) visit throughout the [facility] app or website.  PLEASE NOTE: If you develop severe chest pain or shortness of breath please go to the ER or call 9-1-1 for further evaluation --> DO NOT schedule electronic or virtual visits for this. Please call our office for further guidance / recommendations as needed.    ED Prescriptions    None     PDMP not reviewed this encounter.   Hall[place], [place], [place] 07/20/19 1328

## 2019-07-20 NOTE — ED Notes (Signed)
Patient able to ambulate independently  

## 2019-07-20 NOTE — ED Triage Notes (Signed)
Pt presents to [facility] for assessment of 3 episodes of bile colored emesis x 1 month.  Pt states she took two home pregnancy tests at home over the past week with uncertain results.  Pt states she started her cycle today.  Pt had 1 episode of emesis this morning upon waking up.  States last episode was 2 weeks ago.

## 2019-07-21 LAB — NOVEL CORONAVIRUS, NAA: SARS-CoV-2, NAA: NOT DETECTED

## 2019-09-18 ENCOUNTER — Encounter: Payer: Self-pay | Admitting: Emergency Medicine

## 2019-09-18 ENCOUNTER — Other Ambulatory Visit: Payer: Self-pay

## 2019-09-18 ENCOUNTER — Ambulatory Visit
Admission: EM | Admit: 2019-09-18 | Discharge: 2019-09-18 | Disposition: A | Discharge status: Home / Self Care | Payer: Medicaid Other | Attending: Physician Assistant | Admitting: Physician Assistant

## 2019-09-18 DIAGNOSIS — R102 Pelvic and perineal pain: Secondary | ICD-10-CM

## 2019-09-18 DIAGNOSIS — Z3201 Encounter for pregnancy test, result positive: Secondary | ICD-10-CM

## 2019-09-18 LAB — POCT URINE PREGNANCY: Preg Test, Ur: POSITIVE — AB

## 2019-09-18 NOTE — ED Provider Notes (Signed)
[facility]    CSN: [id number] Arrival date & time: 09/18/19  1833      History   Chief Complaint Chief Complaint  Patient presents with  . Possible Pregnancy    HPI Brittany Hendrix is a 29 y.o. female.   29 year old female comes in for 1 week history of suprapubic abdominal pain and positive home pregnancy test.  Abdominal pain is intermittent, cramping in sensation.  Has nausea without vomiting.  He urinary frequency without dysuria or hematuria.  Denies vaginal discharge, spotting/bleeding, itching.  Denies fever, chills, back pain. LMP 08/16/2019     Past Medical History:  Diagnosis Date  . Abortion history   . Headache(784.0)   . Seizures [facility])    Last seizure March 2017    Patient Active Problem List   Diagnosis Date Noted  . 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 Vaginal delivery 12/27/2015  . Active labor at term 12/26/2015  . Obesity (BMI 35.0-39.9 without comorbidity) 12/25/2015  . Anemia affecting pregnancy 12/25/2015  . Seizure disorder in pregnancy, antepartum ([place]) 05/28/2015  . Supervision of high-risk pregnancy 05/28/2015  . 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 DILATION AND CURETTAGE OF UTERUS     abortion  . DILATION AND EVACUATION N/A 10/04/2017   Procedure: DILATATION AND EVACUATION;  Surgeon: Delsa Bern, MD;  Location: [place] [facility];  Service: Gynecology;  Laterality: N/A;    OB History    Gravida  6   Para  1   Term  1   Preterm  0   AB  4   Living  1     SAB  3   TAB  1   Ectopic  0   Multiple  0   Live Births  1            Home Medications    Prior to Admission medications   Medication Sig Start Date End Date Taking? Authorizing Provider  levETIRAcetam (KEPPRA) 1000 MG tablet TAKE ONE TABLET BY MOUTH TWICE DAILY Patient taking differently: Take 1,000 mg by mouth 2 (two)  times daily.  11/12/18   Marcial Pacas, MD    Family History Family History  Problem Relation Age of Onset  . Diabetes Mother   . Asthma Brother     Social History Social History   Tobacco Use  . Smoking status: Current Every Day Smoker    Packs/day: 0.50    Years: 5.00    Pack years: 2.50    Types: Cigarettes  . Smokeless tobacco: Never Used  Substance Use Topics  . Alcohol use: Yes    Comment: occasional, not while preg  . Drug use: Yes    Types: Marijuana     Allergies   Patient has no known allergies.   Review of Systems Review of Systems  Reason unable to perform ROS: See HPI as above.     Physical Exam Triage Vital Signs ED Triage Vitals [09/18/19 1845]  Enc Vitals Group     BP 124/74     Pulse Rate 72     Resp 18     Temp 98.3 F (36.8 C)     Temp Source Oral     SpO2 99 %     Weight      Height      Head Circumference      Peak Flow      Pain Score  3     Pain Loc      Pain Edu?      Excl. in GC?    No data found.  Updated Vital Signs BP 124/74 (BP Location: Right Arm)   Pulse 72   Temp 98.3 F (36.8 C) (Oral)   Resp 18   LMP 08/16/2019   SpO2 99%   Physical Exam Constitutional:      General: She is not in acute distress.    Appearance: She is well-developed. She is not ill-appearing, toxic-appearing or diaphoretic.  HENT:     Head: Normocephalic and atraumatic.  Eyes:     Conjunctiva/sclera: Conjunctivae normal.     Pupils: Pupils are equal, round, and reactive to light.  Cardiovascular:     Rate and Rhythm: Normal rate and regular rhythm.  Pulmonary:     Effort: Pulmonary effort is normal. No respiratory distress.     Comments: LCTAB Abdominal:     General: Bowel sounds are normal.     Palpations: Abdomen is soft.     Tenderness: There is abdominal tenderness in the suprapubic area. There is no right CVA tenderness, left CVA tenderness, guarding or rebound.  Musculoskeletal:     Cervical back: Normal range of motion and neck  supple.  Skin:    General: Skin is warm and dry.  Neurological:     Mental Status: She is alert and oriented to person, place, and time.  Psychiatric:        Behavior: Behavior normal.        Judgment: Judgment normal.     UC Treatments / Results  Labs (all labs ordered are listed, but only abnormal results are displayed) Labs Reviewed  POCT URINE PREGNANCY - Abnormal; Notable for the following components:      Result Value   Preg Test, Ur Positive (*)    All other components within normal limits    EKG   Radiology No results found.  Procedures Procedures (including critical care time)  Medications Ordered in UC Medications - No data to display  Initial Impression / Assessment and Plan / UC Course  I have reviewed the triage vital signs and the nursing notes.  Pertinent labs & imaging results that were available during my care of the patient were reviewed by me and considered in my medical decision making (see chart for details).    Suprapubic tenderness to palpation without guarding or rebound.  However, urine pregnancy test positive.  Given positive pregnancy test with abdominal pain, patient discharged in stable condition to women's ED for further evaluation and management needed.  Final Clinical Impressions(s) / UC Diagnoses   Final diagnoses:  Positive urine pregnancy test  Suprapubic abdominal pain   ED Prescriptions    None     PDMP not reviewed this encounter.   Belinda Fisher, PA-C 09/18/19 1901

## 2019-09-18 NOTE — ED Triage Notes (Signed)
Pt presents to [facility] for assessment after having two positive pregnancy tests at home.  C/o 3/10 lower abdominal cramping.

## 2019-09-18 NOTE — Discharge Instructions (Signed)
Urine pregnancy test positive. Due to abdominal pain, please go to the women's ED for further evaluation and management needed.

## 2019-09-19 ENCOUNTER — Inpatient Hospital Stay (HOSPITAL_COMMUNITY)
Admission: AD | Admit: 2019-09-19 | Discharge: 2019-09-19 | Disposition: A | Discharge status: Home / Self Care | Payer: Medicaid Other | Attending: Obstetrics and Gynecology | Admitting: Obstetrics and Gynecology

## 2019-09-19 ENCOUNTER — Other Ambulatory Visit: Payer: Self-pay

## 2019-09-19 ENCOUNTER — Inpatient Hospital Stay (HOSPITAL_COMMUNITY): Payer: Medicaid Other

## 2019-09-19 ENCOUNTER — Encounter (HOSPITAL_COMMUNITY): Payer: Self-pay | Admitting: Obstetrics and Gynecology

## 2019-09-19 DIAGNOSIS — F17210 Nicotine dependence, cigarettes, uncomplicated: Secondary | ICD-10-CM | POA: Insufficient documentation

## 2019-09-19 DIAGNOSIS — Z3A01 Less than 8 weeks gestation of pregnancy: Secondary | ICD-10-CM | POA: Diagnosis not present

## 2019-09-19 DIAGNOSIS — O26899 Other specified pregnancy related conditions, unspecified trimester: Secondary | ICD-10-CM

## 2019-09-19 DIAGNOSIS — O3680X0 Pregnancy with inconclusive fetal viability, not applicable or unspecified: Secondary | ICD-10-CM | POA: Diagnosis not present

## 2019-09-19 DIAGNOSIS — O26891 Other specified pregnancy related conditions, first trimester: Secondary | ICD-10-CM | POA: Diagnosis present

## 2019-09-19 DIAGNOSIS — Z79899 Other long term (current) drug therapy: Secondary | ICD-10-CM | POA: Diagnosis not present

## 2019-09-19 DIAGNOSIS — R1030 Lower abdominal pain, unspecified: Secondary | ICD-10-CM | POA: Insufficient documentation

## 2019-09-19 DIAGNOSIS — R110 Nausea: Secondary | ICD-10-CM | POA: Diagnosis not present

## 2019-09-19 DIAGNOSIS — O99331 Smoking (tobacco) complicating pregnancy, first trimester: Secondary | ICD-10-CM | POA: Diagnosis not present

## 2019-09-19 LAB — URINALYSIS, ROUTINE W REFLEX MICROSCOPIC
Bilirubin Urine: NEGATIVE
Glucose, UA: NEGATIVE mg/dL
Hgb urine dipstick: NEGATIVE
Ketones, ur: NEGATIVE mg/dL
Leukocytes,Ua: NEGATIVE
Nitrite: NEGATIVE
Protein, ur: NEGATIVE mg/dL
Specific Gravity, Urine: 1.019 (ref 1.005–1.030)
pH: 8 (ref 5.0–8.0)

## 2019-09-19 LAB — CBC
HCT: 37.6 % (ref 36.0–46.0)
Hemoglobin: 12.6 g/dL (ref 12.0–15.0)
MCH: 33.1 pg (ref 26.0–34.0)
MCHC: 33.5 g/dL (ref 30.0–36.0)
MCV: 98.7 fL (ref 80.0–100.0)
Platelets: 255 10*3/uL (ref 150–400)
RBC: 3.81 MIL/uL — ABNORMAL LOW (ref 3.87–5.11)
RDW: 12 % (ref 11.5–15.5)
WBC: 6.2 10*3/uL (ref 4.0–10.5)
nRBC: 0 % (ref 0.0–0.2)

## 2019-09-19 LAB — WET PREP, GENITAL
Sperm: NONE SEEN
Trich, Wet Prep: NONE SEEN
Yeast Wet Prep HPF POC: NONE SEEN

## 2019-09-19 LAB — HCG, QUANTITATIVE, PREGNANCY: hCG, Beta Chain, Quant, S: 111 m[IU]/mL — ABNORMAL HIGH (ref <5)

## 2019-09-19 NOTE — Discharge Instructions (Signed)
Abdominal Pain During Pregnancy  Belly (abdominal) pain is common during pregnancy. There are many possible causes. Most of the time, it is not a serious problem. Other times, it can be a sign that something is wrong with the pregnancy. Always tell your doctor if you have belly pain. Follow these instructions at home:  Do not have sex or put anything in your vagina until your pain goes away completely.  Get plenty of rest until your pain gets better.  Drink enough fluid to keep your pee (urine) pale yellow.  Take over-the-counter and prescription medicines only as told by your doctor.  Keep all follow-up visits as told by your doctor. This is important. Contact a doctor if:  Your pain continues or gets worse after resting.  You have lower belly pain that: ? Comes and goes at regular times. ? Spreads to your back. ? Feels like menstrual cramps.  You have pain or burning when you pee (urinate). Get help right away if:  You have a fever or chills.  You have vaginal bleeding.  You are leaking fluid from your vagina.  You are passing tissue from your vagina.  You throw up (vomit) for more than 24 hours.  You have watery poop (diarrhea) for more than 24 hours.  Your baby is moving less than usual.  You feel very weak or faint.  You have shortness of breath.  You have very bad pain in your upper belly. Summary  Belly (abdominal) pain is common during pregnancy. There are many possible causes.  If you have belly pain during pregnancy, tell your doctor right away.  Keep all follow-up visits as told by your doctor. This is important. This information is not intended to replace advice given to you by your health care provider. Make sure you discuss any questions you have with your health care provider. Document Revised: 10/29/2018 Document Reviewed: 10/13/2016 Elsevier Patient Education  2020 Elsevier Inc.  

## 2019-09-19 NOTE — MAU Note (Signed)
.   Brittany Hendrix is a 29 y.o. at [redacted]w[redacted]d here in MAU reporting: that she has had lower abdominal cramping on and off for a week. States she went to urgent care yesterday and was told to come here for U/S  LMP: 08/16/19 Onset of complaint:  Pain score: 4 Vitals:   09/19/19 0957  BP: 113/64  Pulse: 86  Resp: 16  Temp: 98.3 F (36.8 C)  SpO2: 100%     FHT: Lab orders placed from triage: UA

## 2019-09-19 NOTE — MAU Provider Note (Signed)
History     CSN: [id number]  Arrival date and time: 09/19/19 4098   First Provider Initiated Contact with Patient 09/19/19 1024      Chief Complaint  Patient presents with  . Abdominal Pain   29 y.o. [id number] @[redacted]w[redacted]d  by sure LMP presenting with abdominal cramping. Reports pain started 1 week ago. Describes as intermittent low abdominal cramping w/o laterality. Rates 4/10. Denies urinary sx except frequency. No VB, discharge, itching, or malodor. Having nausea, no other GI sx.    OB History    Gravida  7   Para  1   Term  1   Preterm  0   AB  5   Living  1     SAB  4   TAB  1   Ectopic  0   Multiple  0   Live Births  1           Past Medical History:  Diagnosis Date  . Abortion history   . Headache(784.0)   . Seizures [facility])    Last seizure March 2017    Past Surgical History:  Procedure Laterality Date  . DILATION AND CURETTAGE OF UTERUS     abortion  . DILATION AND EVACUATION N/A 10/04/2017   Procedure: DILATATION AND EVACUATION;  Surgeon: 10/06/2017, MD;  Location: [facility];  Service: Gynecology;  Laterality: N/A;    Family History  Problem Relation Age of Onset  . Diabetes Mother   . Asthma Brother     Social History   Tobacco Use  . Smoking status: Current Every Day Smoker    Packs/day: 0.50    Years: 5.00    Pack years: 2.50    Types: Cigarettes  . Smokeless tobacco: Never Used  Substance Use Topics  . Alcohol use: Yes    Comment: occasional, not while preg  . Drug use: Yes    Types: Marijuana    Allergies: No Known Allergies  Medications Prior to Admission  Medication Sig Dispense Refill Last Dose  . levETIRAcetam (KEPPRA) 1000 MG tablet TAKE ONE TABLET BY MOUTH TWICE DAILY (Patient taking differently: Take 1,000 mg by mouth 2 (two) times daily. ) 180 tablet 4 09/18/2019 at Unknown time    Review of Systems  Constitutional: Negative for chills and fever.  Gastrointestinal: Positive for abdominal pain and nausea. Negative  for vomiting.  Genitourinary: Positive for frequency. Negative for dysuria, hematuria, urgency, vaginal bleeding and vaginal discharge.   Physical Exam   Blood pressure (!) 108/58, pulse 86, temperature 98.3 F (36.8 C), resp. rate 16, height 5\' 4"  (1.626 m), weight 89.8 kg, last menstrual period 08/16/2019, SpO2 100 %, unknown if currently breastfeeding.  Physical Exam  Nursing note and vitals reviewed. Constitutional: She is oriented to person, place, and time. She appears well-developed and well-nourished. No distress.  HENT:  Head: Normocephalic and atraumatic.  Cardiovascular: Normal rate.  Respiratory: Effort normal. No respiratory distress.  GI: Soft. She exhibits no distension and no mass. There is no abdominal tenderness. There is no rebound and no guarding.  Genitourinary:    Genitourinary Comments: External: no lesions or erythema Uterus: non enlarged, anteverted, non tender, no CMT Adnexae: no masses, no tenderness left, no tenderness right Cervix closed/long    Musculoskeletal:        General: Normal range of motion.     Cervical back: Normal range of motion.  Neurological: She is alert and oriented to person, place, and time.  Skin: Skin is warm and dry.  Psychiatric: She has a normal mood and affect.   Results for orders placed or performed during the hospital encounter of 09/19/19 (from the past 24 hour(s))  Urinalysis, Routine w reflex microscopic     Status: None   Collection Time: 09/19/19 10:23 AM  Result Value Ref Range   Color, Urine YELLOW YELLOW   APPearance CLEAR CLEAR   Specific Gravity, Urine 1.019 1.005 - 1.030   pH 8.0 5.0 - 8.0   Glucose, UA NEGATIVE NEGATIVE mg/dL   Hgb urine dipstick NEGATIVE NEGATIVE   Bilirubin Urine NEGATIVE NEGATIVE   Ketones, ur NEGATIVE NEGATIVE mg/dL   Protein, ur NEGATIVE NEGATIVE mg/dL   Nitrite NEGATIVE NEGATIVE   Leukocytes,Ua NEGATIVE NEGATIVE  Wet prep, genital     Status: Abnormal   Collection Time: 09/19/19  10:30 AM  Result Value Ref Range   Yeast Wet Prep HPF POC NONE SEEN NONE SEEN   Trich, Wet Prep NONE SEEN NONE SEEN   Clue Cells Wet Prep HPF POC MODERATE (A) NONE SEEN   WBC, Wet Prep HPF POC FEW (A) NONE SEEN   Sperm NONE SEEN   CBC     Status: Abnormal   Collection Time: 09/19/19 10:47 AM  Result Value Ref Range   WBC 6.2 4.0 - 10.5 K/uL   RBC 3.81 (L) 3.87 - 5.11 MIL/uL   Hemoglobin 12.6 12.0 - 15.0 g/dL   HCT 37.6 36.0 - 46.0 %   MCV 98.7 80.0 - 100.0 fL   MCH 33.1 26.0 - 34.0 pg   MCHC 33.5 30.0 - 36.0 g/dL   RDW 12.0 11.5 - 15.5 %   Platelets 255 150 - 400 K/uL   nRBC 0.0 0.0 - 0.2 %  hCG, quantitative, pregnancy     Status: Abnormal   Collection Time: 09/19/19 10:47 AM  Result Value Ref Range   hCG, Beta Chain, Quant, S 111 (H) <5 mIU/mL   US OB LESS THAN 14 WEEKS WITH OB TRANSVAGINAL  Result Date: 09/19/2019 CLINICAL DATA:  Abdominal pain in first trimester of pregnancy; LMP 08/16/2019 EXAM: OBSTETRIC <14 WK [place] AND TRANSVAGINAL OB US TECHNIQUE: Both transabdominal and transvaginal ultrasound examinations were performed for complete evaluation of the gestation as well as the maternal uterus, adnexal regions, and pelvic cul-de-sac. Transvaginal technique was performed to assess early pregnancy. COMPARISON:  None for this gestation FINDINGS: Intrauterine gestational sac: Absent Yolk sac:  N/A Embryo:  N/A Cardiac Activity: N/A Heart Rate: N/A  bpm MSD:   mm    w     d CRL:    mm    w    d                  [place] EDC: Subchorionic hemorrhage:  N/A Maternal uterus/adnexae: Uterus anteverted with without mass. Endometrial complex 19 mm thick. No gestational sac or endometrial fluid. LEFT ovary normal size and morphology 2.2 x 2.8 x 2.0 cm. RIGHT ovary measures 4.1 x 2.6 x 2.3 cm and contains a small corpus luteum. No free pelvic fluid or adnexal masses. IMPRESSION: No intrauterine gestation identified. Findings are consistent with pregnancy of unknown location. Differential diagnosis  includes early ectopic pregnancy too early to visualize, spontaneous abortion, and ectopic pregnancy. Serial quantitative beta HCG and or follow-up ultrasound recommended to definitively exclude ectopic pregnancy. Electronically Signed   By: Lavonia Dana M.D.   On: 09/19/2019 11:18   MAU Course  Procedures  MDM Labs and [place] ordered and reviewed. No IUP or adnexal  mass seen on [place], findings could indicate early pregnancy, ectopic pregnancy, or failed pregnancy-discussed with pt. Will follow quant in 48 hrs. Stable for discharge home.   Assessment and Plan   1. Pregnancy of unknown anatomic location   2. Abdominal pain in pregnancy    Discharge home Follow up at MAU in 2 days for stat hcg Ectopic/SAB precautions  Allergies as of 09/19/2019   No Known Allergies     Medication List    TAKE these medications   levETIRAcetam 1000 MG tablet Commonly known as: KEPPRA TAKE ONE TABLET BY MOUTH TWICE DAILY What changed:   how much to take  how to take this  when to take this  additional instructions      Donette Larry, CNM 09/19/2019, 12:13 PM

## 2019-09-20 LAB — GC/CHLAMYDIA PROBE AMP (~~LOC~~) NOT AT ARMC
Chlamydia: NEGATIVE
Comment: NEGATIVE
Comment: NORMAL
Neisseria Gonorrhea: NEGATIVE

## 2019-09-21 ENCOUNTER — Inpatient Hospital Stay (HOSPITAL_COMMUNITY)
Admission: AD | Admit: 2019-09-21 | Discharge: 2019-09-21 | Disposition: A | Discharge status: Home / Self Care | Payer: Medicaid Other | Attending: Obstetrics & Gynecology | Admitting: Obstetrics & Gynecology

## 2019-09-21 ENCOUNTER — Other Ambulatory Visit: Payer: Self-pay

## 2019-09-21 DIAGNOSIS — O26891 Other specified pregnancy related conditions, first trimester: Secondary | ICD-10-CM | POA: Diagnosis present

## 2019-09-21 DIAGNOSIS — Z3490 Encounter for supervision of normal pregnancy, unspecified, unspecified trimester: Secondary | ICD-10-CM

## 2019-09-21 DIAGNOSIS — Z3A01 Less than 8 weeks gestation of pregnancy: Secondary | ICD-10-CM | POA: Insufficient documentation

## 2019-09-21 DIAGNOSIS — R109 Unspecified abdominal pain: Secondary | ICD-10-CM | POA: Diagnosis not present

## 2019-09-21 LAB — HCG, QUANTITATIVE, PREGNANCY: hCG, Beta Chain, Quant, S: 325 m[IU]/mL — ABNORMAL HIGH (ref <5)

## 2019-09-21 NOTE — MAU Provider Note (Signed)
Patient Brittany Hendrix is 29 y.o. [phone]  at [redacted]w[redacted]d here for follow up bHCG. Her beta on 2/25 was 111. Today beta is 325. Patient reports still some cramping, but no bleeding. Some constipation, but feels well otherwise. Occasional nausea.    Appropriate rise in quant, although cautioned patient that she was still early in her pregnancy.   She will call [facility] to make appt in 6 weeks.   Return to MAU if any bleeding, increase in cramping or other changes.     Charlesetta Garibaldi Whittley Carandang 09/21/2019, 10:24 AM

## 2019-09-21 NOTE — MAU Note (Signed)
Brittany Hendrix is a 29 y.o. at [redacted]w[redacted]d here in MAU reporting: here for follow up hcg. Have some mild cramping but no bleeding.  Pain score: 0/10  Vitals:   09/21/19 0759  BP: (!) 114/55  Pulse: 87  Resp: 17  Temp: 98.8 F (37.1 C)  SpO2: 100%     Lab orders placed from triage: hcg

## 2019-09-21 NOTE — Discharge Instructions (Signed)
[facility] Providers    [facility]       Phone: [phone]  [facility]   Phone: [phone]  [facility]  Phone: [phone]  [facility] [place]  Phone: [phone]  [facility] [place]  Phone: [phone]  [facility]   Phone: [phone]  [facility]       Phone: [phone]  [facility]    Phone: [phone]   [facility]    Phone: [phone]  [facility]    Phone: [phone]  [facility]    Phone: [phone]  [facility]       Phone: [phone]   [facility]  Phone: [phone]  [facility]    Phone: [phone]  [facility]   Phone: [phone]  [facility]      Phone: [phone]  [facility]    Phone: [phone]   

## 2019-10-02 ENCOUNTER — Telehealth: Payer: Self-pay

## 2019-10-02 NOTE — Telephone Encounter (Signed)
Returned call, and pt is requesting dental letter, provided per office manager approval.

## 2019-10-07 ENCOUNTER — Other Ambulatory Visit: Payer: Self-pay

## 2019-10-07 ENCOUNTER — Encounter (HOSPITAL_COMMUNITY): Payer: Self-pay | Admitting: Obstetrics & Gynecology

## 2019-10-07 ENCOUNTER — Inpatient Hospital Stay (HOSPITAL_COMMUNITY): Payer: Medicaid Other

## 2019-10-07 ENCOUNTER — Inpatient Hospital Stay (HOSPITAL_COMMUNITY)
Admission: AD | Admit: 2019-10-07 | Discharge: 2019-10-08 | Disposition: A | Discharge status: Home / Self Care | Payer: Medicaid Other | Attending: Obstetrics & Gynecology | Admitting: Obstetrics & Gynecology

## 2019-10-07 DIAGNOSIS — O99331 Smoking (tobacco) complicating pregnancy, first trimester: Secondary | ICD-10-CM | POA: Insufficient documentation

## 2019-10-07 DIAGNOSIS — F17210 Nicotine dependence, cigarettes, uncomplicated: Secondary | ICD-10-CM | POA: Insufficient documentation

## 2019-10-07 DIAGNOSIS — Z3A01 Less than 8 weeks gestation of pregnancy: Secondary | ICD-10-CM | POA: Insufficient documentation

## 2019-10-07 DIAGNOSIS — O209 Hemorrhage in early pregnancy, unspecified: Secondary | ICD-10-CM | POA: Diagnosis present

## 2019-10-07 LAB — URINALYSIS, ROUTINE W REFLEX MICROSCOPIC
Bilirubin Urine: NEGATIVE
Glucose, UA: NEGATIVE mg/dL
Hgb urine dipstick: NEGATIVE
Ketones, ur: NEGATIVE mg/dL
Leukocytes,Ua: NEGATIVE
Nitrite: NEGATIVE
Protein, ur: NEGATIVE mg/dL
Specific Gravity, Urine: 1.021 (ref 1.005–1.030)
pH: 6 (ref 5.0–8.0)

## 2019-10-07 NOTE — MAU Provider Note (Signed)
Chief Complaint: Vaginal Bleeding   First Provider Initiated Contact with Patient 10/07/19 2238      SUBJECTIVE HPI: Brittany Hendrix is a 29 y.o. [id number] at [redacted]w[redacted]d by LMP who presents to maternity admissions reporting vaginal bleeding that started about 30 minutes prior to arrival. Found out she was pregnant on 2/24 and presented to MAU on 2/25 with cramping and quant 111 at that time and [place] without IUP. Returned on 2/27 for f/u quant which was 325. She had been feeling okay until today. Reports mild cramping rating about 1/10, feels like "muscles tightening." Reports noting light bleeding with wiping. Not enough to soak a pad and denies clots. Has not taken anything for cramping as pain has not been severe enough. 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 Nothing tends to make cramping better or worse. Reports this is a desired pregnancy. She did have intercourse this morning.  She denies vaginal itching/burning, urinary symptoms, h/a, dizziness, n/v, or fever/chills.    Past Medical History:  Diagnosis Date  . Abortion history   . Headache(784.0)   . Seizures [facility])    Last seizure March 2020   Past Surgical History:  Procedure Laterality Date  . DILATION AND CURETTAGE OF UTERUS     abortion  . DILATION AND EVACUATION N/A 10/04/2017   Procedure: DILATATION AND EVACUATION;  Surgeon: Silverio Lay, MD;  Location: [facility];  Service: Gynecology;  Laterality: N/A;   Social History   Socioeconomic History  . Marital status: Single    Spouse name: Not on file  . Number of children: 0  . Years of education: 63  . Highest education level: Not on file  Occupational History  . Occupation: Lobbyist: Production manager    Comment: [employer]  Tobacco Use  . Smoking status: Current Every Day Smoker    Packs/day: 0.50    Years: 5.00    Pack years: 2.50    Types: Cigarettes  . Smokeless tobacco: Never Used  Substance and Sexual Activity  . Alcohol use: Not Currently   Comment: occasional, not while preg  . Drug use: Not Currently    Types: Marijuana  . Sexual activity: Yes    Birth control/protection: None  Other Topics Concern  . Not on file  Social History Narrative   Patient is single and lives at home alone. Patient works at [employer]. Right handed. Caffeine three daily.   Social Determinants of Health   Financial Resource Strain:   . Difficulty of Paying Living Expenses:   Food Insecurity:   . Worried About Programme researcher, broadcasting/film/video in the Last Year:   . Barista in the Last Year:   Transportation Needs:   . Freight forwarder (Medical):   [name] Lack of Transportation (Non-Medical):   Physical Activity:   . Days of Exercise per Week:   . Minutes of Exercise per Session:   Stress:   . Feeling of Stress :   Social Connections:   . Frequency of Communication with Friends and Family:   . Frequency of Social Gatherings with Friends and Family:   . Attends Religious Services:   . Active Member of Clubs or Organizations:   . Attends Banker Meetings:   [name] Marital Status:   Intimate Partner Violence:   . Fear of Current or Ex-Partner:   . Emotionally Abused:   [name] Physically Abused:   . Sexually Abused:    No current facility-administered  medications on file prior to encounter.   Current Outpatient Medications on File Prior to Encounter  Medication Sig Dispense Refill  . Prenatal Vit-Fe Fumarate-FA (PRENATAL MULTIVITAMIN) TABS tablet Take 1 tablet by mouth daily at 12 noon.    . levETIRAcetam (KEPPRA) 1000 MG tablet TAKE ONE TABLET BY MOUTH TWICE DAILY (Patient taking differently: Take 1,000 mg by mouth 2 (two) times daily. ) 180 tablet 4   No Known Allergies  ROS:  Review of Systems All other systems negative unless noted above in HPI.   I have reviewed patient's Past Medical Hx, Surgical Hx, Family Hx, Social Hx, medications and allergies.   Physical Exam   Patient Vitals for the past 24 hrs:  BP Temp Pulse Resp  Weight  10/08/19 0050 - - - 18 -  10/07/19 2212 115/60 98.4 F (36.9 C) 69 19 89.6 kg   Constitutional: Well-developed, well-nourished female in no acute distress.  Cardiovascular: normal rate Respiratory: normal effort GI: Abd soft, non-tender.  MS: Extremities nontender, no edema, normal ROM Neurologic: Alert and oriented x 4.  GU: Neg CVAT. PELVIC EXAM: Cervix pink, without lesion, scant blood noted at a visually closed cervical os, vaginal walls and external genitalia normal Bimanual exam: Cervix 0/long/high, firm, neg CMT, uterus nontender, nonenlarged, adnexa without tenderness, enlargement, or mass  LAB RESULTS Results for orders placed or performed during the hospital encounter of 10/07/19 (from the past 24 hour(s))  Urinalysis, Routine w reflex microscopic     Status: None   Collection Time: 10/07/19 10:13 PM  Result Value Ref Range   Color, Urine YELLOW YELLOW   APPearance CLEAR CLEAR   Specific Gravity, Urine 1.021 1.005 - 1.030   pH 6.0 5.0 - 8.0   Glucose, UA NEGATIVE NEGATIVE mg/dL   Hgb urine dipstick NEGATIVE NEGATIVE   Bilirubin Urine NEGATIVE NEGATIVE   Ketones, ur NEGATIVE NEGATIVE mg/dL   Protein, ur NEGATIVE NEGATIVE mg/dL   Nitrite NEGATIVE NEGATIVE   Leukocytes,Ua NEGATIVE NEGATIVE  hCG, quantitative, pregnancy     Status: Abnormal   Collection Time: 10/07/19 11:17 PM  Result Value Ref Range   hCG, Beta Chain, Quant, S 28,220 (H) <5 mIU/mL  Comprehensive metabolic panel     Status: None   Collection Time: 10/07/19 11:17 PM  Result Value Ref Range   Sodium 137 135 - 145 mmol/L   Potassium 3.6 3.5 - 5.1 mmol/L   Chloride 105 98 - 111 mmol/L   CO2 23 22 - 32 mmol/L   Glucose, Bld 94 70 - 99 mg/dL   BUN 10 6 - 20 mg/dL   Creatinine, Ser 5.70 0.44 - 1.00 mg/dL   Calcium 9.0 8.9 - 17.7 mg/dL   Total Protein 6.5 6.5 - 8.1 g/dL   Albumin 3.7 3.5 - 5.0 g/dL   AST 16 15 - 41 U/L   ALT 19 0 - 44 U/L   Alkaline Phosphatase 53 38 - 126 U/L   Total  Bilirubin 0.5 0.3 - 1.2 mg/dL   GFR calc non Af Amer >60 >60 mL/min   GFR calc Af Amer >60 >60 mL/min   Anion gap 9 5 - 15       IMAGING US OB LESS THAN 14 WEEKS WITH OB TRANSVAGINAL  Result Date: 10/08/2019 CLINICAL DATA:  Vaginal bleeding, no clots, cramping EXAM: OBSTETRIC <14 WK [place] AND TRANSVAGINAL OB US TECHNIQUE: Both transabdominal and transvaginal ultrasound examinations were performed for complete evaluation of the gestation as well as the maternal uterus, adnexal regions,  and pelvic cul-de-sac. Transvaginal technique was performed to assess early pregnancy. COMPARISON:  Ultrasound 09/19/2019 FINDINGS: Intrauterine gestational sac: Single Yolk sac:  Visualized. Embryo:  Visualized. Cardiac Activity: Visualized. Heart Rate: 130 bpm. CRL:  9.25 mm   6 w   6 d                  [place] EDC: 05/26/2020 Subchorionic hemorrhage:  None visualized. Maternal uterus/adnexae: Three-dimensional sonographic reconstruction demonstrates an arcuate configuration of the uterine fundus with the gestational sac position in the right fundal moiety. Corpus luteum cyst noted in the right ovary. Left ovary is unremarkable. No free fluid. IMPRESSION: Single viable intrauterine gestation with estimated gestational age of [redacted] weeks, 6 days by crown-rump length measurement. 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 Electronically Signed   By: Kreg Shropshire M.D.   On: 10/08/2019 00:14   US OB LESS THAN 14 WEEKS WITH OB TRANSVAGINAL  Result Date: 09/19/2019 CLINICAL DATA:  Abdominal pain in first trimester of pregnancy; LMP 08/16/2019 EXAM: OBSTETRIC <14 WK [place] AND TRANSVAGINAL OB US TECHNIQUE: Both transabdominal and transvaginal ultrasound examinations were performed for complete evaluation of the gestation as well as the maternal uterus, adnexal regions, and pelvic cul-de-sac. Transvaginal technique was performed to assess early pregnancy. COMPARISON:  None for  this gestation FINDINGS: Intrauterine gestational sac: Absent Yolk sac:  N/A Embryo:  N/A Cardiac Activity: N/A Heart Rate: N/A  bpm MSD:   mm    w     d CRL:    mm    w    d                  [place] EDC: Subchorionic hemorrhage:  N/A Maternal uterus/adnexae: Uterus anteverted with without mass. Endometrial complex 19 mm thick. No gestational sac or endometrial fluid. LEFT ovary normal size and morphology 2.2 x 2.8 x 2.0 cm. RIGHT ovary measures 4.1 x 2.6 x 2.3 cm and contains a small corpus luteum. No free pelvic fluid or adnexal masses. IMPRESSION: No intrauterine gestation identified. Findings are consistent with pregnancy of unknown location. Differential diagnosis includes early ectopic pregnancy too early to visualize, spontaneous abortion, and ectopic pregnancy. Serial quantitative beta HCG and or follow-up ultrasound recommended to definitively exclude ectopic pregnancy. Electronically Signed   By: Ulyses Southward M.D.   On: 09/19/2019 11:18    MAU Management/MDM: Orders Placed This Encounter  Procedures  . US OB LESS THAN 14 WEEKS WITH OB TRANSVAGINAL  . Urinalysis, Routine w reflex microscopic  . hCG, quantitative, pregnancy  . Comprehensive metabolic panel  . Discharge patient    No orders of the defined types were placed in this encounter.   Patient presented with bleeding in early pregnancy of unknown location. Known blood type A Positive. Cervix closed on exam with scant blood at os and no other abnormalities. UA without evidence of infection. Repeat hCG 28k. [place] with viable SIUP c/w LMP. CMP WNL. CBC on 2/25 WNL and minimal bleeding therefore did not repeat. Patient given strict return precautions. She is to f/u with [facility] for prenatal care. Patient verbalized understanding of discharge and follow-up instructions and was ambulating without assistance upon discharge.  Future Appointments  Date Time Provider Department Center  10/31/2019  1:10 PM [facility] NURSE [facility] None  11/07/2019  1:00 PM  Sharyon Cable, CNM [facility] None    ASSESSMENT 1. Vaginal bleeding in pregnancy, first trimester  PLAN Discharge home Allergies as of 10/08/2019   No Known Allergies     Medication List    TAKE these medications   levETIRAcetam 1000 MG tablet Commonly known as: KEPPRA TAKE ONE TABLET BY MOUTH TWICE DAILY What changed:   how Hendrix to take  how to take this  when to take this  additional instructions   prenatal multivitamin Tabs tablet Take 1 tablet by mouth daily at 12 noon.      Follow-up Information    [facility]. Schedule an appointment as soon as possible for a visit in 6 week(s).   Specialty: Obstetrics and Gynecology Contact information: [address], [place], [place], [facility] for [facility] [place] [facility] 10/08/2019  12:54 AM

## 2019-10-07 NOTE — MAU Note (Signed)
Started having light, bright red vaginal bleeding tonight.  No clots.  Denies any other bleeding in the pregnancy.  Some very mild abdominal pain (1/10).  States she had intercourse this morning.

## 2019-10-08 LAB — COMPREHENSIVE METABOLIC PANEL
ALT: 19 U/L (ref 0–44)
AST: 16 U/L (ref 15–41)
Albumin: 3.7 g/dL (ref 3.5–5.0)
Alkaline Phosphatase: 53 U/L (ref 38–126)
Anion gap: 9 (ref 5–15)
BUN: 10 mg/dL (ref 6–20)
CO2: 23 mmol/L (ref 22–32)
Calcium: 9 mg/dL (ref 8.9–10.3)
Chloride: 105 mmol/L (ref 98–111)
Creatinine, Ser: 0.67 mg/dL (ref 0.44–1.00)
GFR calc Af Amer: >60 mL/min (ref >60)
GFR calc non Af Amer: >60 mL/min (ref >60)
Glucose, Bld: 94 mg/dL (ref 70–99)
Potassium: 3.6 mmol/L (ref 3.5–5.1)
Sodium: 137 mmol/L (ref 135–145)
Total Bilirubin: 0.5 mg/dL (ref 0.3–1.2)
Total Protein: 6.5 g/dL (ref 6.5–8.1)

## 2019-10-08 LAB — HCG, QUANTITATIVE, PREGNANCY: hCG, Beta Chain, Quant, S: 28220 m[IU]/mL — ABNORMAL HIGH (ref <5)

## 2019-10-08 NOTE — Discharge Instructions (Signed)

## 2019-10-27 ENCOUNTER — Other Ambulatory Visit: Payer: Self-pay | Admitting: Advanced Practice Midwife

## 2019-10-27 ENCOUNTER — Other Ambulatory Visit: Payer: Self-pay

## 2019-10-27 ENCOUNTER — Inpatient Hospital Stay (HOSPITAL_COMMUNITY)
Admission: AD | Admit: 2019-10-27 | Discharge: 2019-10-27 | Disposition: A | Discharge status: Home / Self Care | Payer: Medicaid Other | Attending: Family Medicine | Admitting: Family Medicine

## 2019-10-27 ENCOUNTER — Encounter (HOSPITAL_COMMUNITY): Payer: Self-pay | Admitting: Family Medicine

## 2019-10-27 DIAGNOSIS — O99331 Smoking (tobacco) complicating pregnancy, first trimester: Secondary | ICD-10-CM | POA: Insufficient documentation

## 2019-10-27 DIAGNOSIS — Z3A10 10 weeks gestation of pregnancy: Secondary | ICD-10-CM | POA: Diagnosis not present

## 2019-10-27 DIAGNOSIS — N760 Acute vaginitis: Secondary | ICD-10-CM

## 2019-10-27 DIAGNOSIS — N939 Abnormal uterine and vaginal bleeding, unspecified: Secondary | ICD-10-CM

## 2019-10-27 DIAGNOSIS — O209 Hemorrhage in early pregnancy, unspecified: Secondary | ICD-10-CM | POA: Insufficient documentation

## 2019-10-27 DIAGNOSIS — O4691 Antepartum hemorrhage, unspecified, first trimester: Secondary | ICD-10-CM | POA: Diagnosis not present

## 2019-10-27 DIAGNOSIS — O26891 Other specified pregnancy related conditions, first trimester: Secondary | ICD-10-CM | POA: Insufficient documentation

## 2019-10-27 DIAGNOSIS — B9689 Other specified bacterial agents as the cause of diseases classified elsewhere: Secondary | ICD-10-CM

## 2019-10-27 DIAGNOSIS — B373 Candidiasis of vulva and vagina: Secondary | ICD-10-CM

## 2019-10-27 DIAGNOSIS — F17210 Nicotine dependence, cigarettes, uncomplicated: Secondary | ICD-10-CM | POA: Insufficient documentation

## 2019-10-27 DIAGNOSIS — Z79899 Other long term (current) drug therapy: Secondary | ICD-10-CM | POA: Insufficient documentation

## 2019-10-27 DIAGNOSIS — R1032 Left lower quadrant pain: Secondary | ICD-10-CM | POA: Diagnosis not present

## 2019-10-27 DIAGNOSIS — Q51810 Arcuate uterus: Secondary | ICD-10-CM

## 2019-10-27 DIAGNOSIS — O26899 Other specified pregnancy related conditions, unspecified trimester: Secondary | ICD-10-CM

## 2019-10-27 DIAGNOSIS — B3731 Acute candidiasis of vulva and vagina: Secondary | ICD-10-CM

## 2019-10-27 HISTORY — DX: Arcuate uterus: Q51.810

## 2019-10-27 LAB — URINALYSIS, ROUTINE W REFLEX MICROSCOPIC
Bilirubin Urine: NEGATIVE
Glucose, UA: NEGATIVE mg/dL
Hgb urine dipstick: NEGATIVE
Ketones, ur: NEGATIVE mg/dL
Leukocytes,Ua: NEGATIVE
Nitrite: NEGATIVE
Protein, ur: NEGATIVE mg/dL
Specific Gravity, Urine: 1.015 (ref 1.005–1.030)
pH: 9 — ABNORMAL HIGH (ref 5.0–8.0)

## 2019-10-27 LAB — WET PREP, GENITAL
Sperm: NONE SEEN
Trich, Wet Prep: NONE SEEN

## 2019-10-27 MED ORDER — METRONIDAZOLE 500 MG PO TABS: 500.0000 mg | ORAL_TABLET | Freq: Two times a day (BID) | ORAL | 0 refills | Status: DC

## 2019-10-27 MED ORDER — TERCONAZOLE 0.4 % VA CREA: 1.0000 | TOPICAL_CREAM | Freq: Every day | VAGINAL | 1 refills | Status: DC

## 2019-10-27 NOTE — MAU Provider Note (Signed)
Chief Complaint: Abdominal Pain and Vaginal Bleeding   First Provider Initiated Contact with Patient 10/27/19 1133     SUBJECTIVE HPI: Brittany Hendrix is a 29 y.o. [id number] at [redacted]w[redacted]d who presents to Maternity Admissions reporting  ongoing spotting and new pain that started yesterday. No recent IC.  Location: Left lower quadrant Quality: Cramping Severity: 5/10 on pain scale Duration: Less than 24 hours Context: [redacted] weeks gestation.  IUP previously verified by formal ultrasound. Timing: Intermittent Modifying factors: Nothing.  Has not tried anything for the pain. Associated signs and symptoms: Positive for spotting and vaginal discharge.  Negative for urinary complaints, GI complaints.  Past Medical History:  Diagnosis Date  . Abortion history   . Headache(784.0)   . Seizures (HCC)    Last seizure March 2020   OB History  Gravida Para Term Preterm AB Living  7 1 1  0 5 1  SAB TAB Ectopic Multiple Live Births  4 1 0 0 1    # Outcome Date GA Lbr Len/2nd Weight Sex Delivery Anes PTL Lv  7 Current           6 SAB 2019     SAB     5 Term 12/27/15 [redacted]w[redacted]d 21:31 / 00:42 3799 g M Vag-Spont EPI  LIV  4 SAB              Birth Comments: System Generated. Please review and update pregnancy details.  3 SAB           2 SAB           1 TAB            Past Surgical History:  Procedure Laterality Date  . DILATION AND CURETTAGE OF UTERUS     abortion  . DILATION AND EVACUATION N/A 10/04/2017   Procedure: DILATATION AND EVACUATION;  Surgeon: 10/06/2017, MD;  Location: [facility];  Service: Gynecology;  Laterality: N/A;   Social History   Socioeconomic History  . Marital status: Single    Spouse name: Not on file  . Number of children: 0  . Years of education: 38  . Highest education level: Not on file  Occupational History  . 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 Smoking status: Current Every Day Smoker    Packs/day: 0.50    Years:  5.00    Pack years: 2.50    Types: Cigarettes  . Smokeless tobacco: Never Used  Substance and Sexual Activity  . Alcohol use: Not Currently    Comment: occasional, not while preg  . Drug use: Not Currently    Types: Marijuana  . Sexual activity: Yes    Birth control/protection: None  Other Topics Concern  . Not on file  Social History Narrative   Patient is single and lives at home alone. Patient works at [employer]. Right handed. Caffeine three daily.   Social Determinants of Health   Financial Resource Strain:   . Difficulty of Paying Living Expenses:   Food Insecurity:   . Worried About [employer] in the Last Year:   . Programme researcher, broadcasting/film/video in the Last Year:   Transportation Needs:   . Barista (Medical):   Freight forwarder Lack of Transportation (Non-Medical):   Physical Activity:   . Days of Exercise per Week:   . Minutes of Exercise per Session:   Stress:   . Feeling of Stress :  Social Connections:   . Frequency of Communication with Friends and Family:   . Frequency of Social Gatherings with Friends and Family:   . Attends Religious Services:   . Active Member of Clubs or Organizations:   . Attends Archivist Meetings:   [name] Marital Status:   Intimate Partner Violence:   . Fear of Current or Ex-Partner:   . Emotionally Abused:   [name] Physically Abused:   . Sexually Abused:    Family History  Problem Relation Age of Onset  . Diabetes Mother   . Asthma Brother    No current facility-administered medications on file prior to encounter.   Current Outpatient Medications on File Prior to Encounter  Medication Sig Dispense Refill  . levETIRAcetam (KEPPRA) 1000 MG tablet TAKE ONE TABLET BY MOUTH TWICE DAILY (Patient taking differently: Take 1,000 mg by mouth 2 (two) times daily. ) 180 tablet 4  . Prenatal Vit-Fe Fumarate-FA (PRENATAL MULTIVITAMIN) TABS tablet Take 1 tablet by mouth daily at 12 noon.     No Known Allergies  I have reviewed patient's  Past Medical Hx, Surgical Hx, Family Hx, Social Hx, medications and allergies.   Review of Systems  Constitutional: Negative for chills and fever.  Gastrointestinal: Positive for abdominal pain. Negative for constipation, diarrhea, nausea and vomiting.  Genitourinary: Positive for vaginal bleeding and vaginal discharge. Negative for dysuria, frequency, hematuria, pelvic pain and urgency.    OBJECTIVE Patient Vitals for the past 24 hrs:  BP Temp Temp src Pulse Resp SpO2 Height Weight  10/27/19 1023 118/70 98.5 F (36.9 C) Oral 76 16 100 % -- --  10/27/19 1011 -- -- -- -- -- -- 5\' 4"  (1.626 m) 91 kg   Constitutional: Well-developed, well-nourished female in no acute distress.  Cardiovascular: normal rate Respiratory: normal rate and effort.  GI: Abd soft, non-tender, gravid appropriate for gestational age.  MS: Extremities nontender, no edema, normal ROM Neurologic: Alert and oriented x 4.  GU: Neg CVAT.  PELVIC EXAM: NEFG, physiologic discharge, scant blood noted, cervix closed; uterus normal size, no adnexal tenderness or masses. No CMT.  Fetal heart rate 165  LAB RESULTS Results for orders placed or performed during the hospital encounter of 10/27/19 (from the past 24 hour(s))  Urinalysis, Routine w reflex microscopic     Status: Abnormal   Collection Time: 10/27/19 10:13 AM  Result Value Ref Range   Color, Urine YELLOW YELLOW   APPearance HAZY (A) CLEAR   Specific Gravity, Urine 1.015 1.005 - 1.030   pH 9.0 (H) 5.0 - 8.0   Glucose, UA NEGATIVE NEGATIVE mg/dL   Hgb urine dipstick NEGATIVE NEGATIVE   Bilirubin Urine NEGATIVE NEGATIVE   Ketones, ur NEGATIVE NEGATIVE mg/dL   Protein, ur NEGATIVE NEGATIVE mg/dL   Nitrite NEGATIVE NEGATIVE   Leukocytes,Ua NEGATIVE NEGATIVE    IMAGING NA  MAU COURSE Orders Placed This Encounter  Procedures  . Wet prep, genital  . Urinalysis, Routine w reflex microscopic  . Lab instructions  . Discharge patient   No orders of the  defined types were placed in this encounter.   MDM - Ongoing spotting new onset cramping possibly due to yeast and BV.  No evidence of miscarriage in process or cervical dilation.  Rx Terazol and Flagyl.  ASSESSMENT 1. LLQ pain   2. Abdominal pain affecting pregnancy   3. Vagina bleeding     PLAN Discharge home in stable condition. SAB precautions Pelvic rest x1 week. Follow-up Information    [facility] Follow up.   Why: As scheduled or sooner as needed if symptoms worsen Contact information: [address] [place] [address] [phone]       [facility] Follow up.   Specialty: Obstetrics and Gynecology Why: As needed in pregnancy emergencies Contact information: [address] [id number] mc [address] [place] [address] [phone]         Allergies as of 10/27/2019   No Known Allergies     Medication List    TAKE these medications   levETIRAcetam 1000 MG tablet Commonly known as: KEPPRA TAKE ONE TABLET BY MOUTH TWICE DAILY What changed:   how much to take  how to take this  when to take this  additional instructions   prenatal multivitamin Tabs tablet Take 1 tablet by mouth daily at 12 noon.        Katrinka Blazing, [place], CNM 10/27/2019  11:45 AM

## 2019-10-27 NOTE — Progress Notes (Signed)
Wet prep was positive for bacterial vaginosis and yeast infection.  Rx Flagyl and Terazol.

## 2019-10-27 NOTE — MAU Note (Signed)
Brittany Hendrix is a 29 y.o. at [redacted]w[redacted]d here in MAU reporting: ongoing spotting and pain that started yesterday. No recent IC.  Onset of complaint: bleeding is ongoing, pain started yesterday  Pain score: 5/10  Vitals:   10/27/19 1023  BP: 118/70  Pulse: 76  Resp: 16  Temp: 98.5 F (36.9 C)  SpO2: 100%     Lab orders placed from triage: UA

## 2019-10-27 NOTE — Discharge Instructions (Signed)
Abdominal Pain During Pregnancy  Abdominal pain is common during pregnancy, and has many possible causes. Some causes are more serious than others, and sometimes the cause is not known. Abdominal pain can be a sign that labor is starting. It can also be caused by normal growth and stretching of muscles and ligaments during pregnancy. Always tell your health care provider if you have any abdominal pain. Follow these instructions at home:  Do not have sex or put anything in your vagina until your pain goes away completely.  Get plenty of rest until your pain improves.  Drink enough fluid to keep your urine pale yellow.  Take over-the-counter and prescription medicines only as told by your health care provider.  Keep all follow-up visits as told by your health care provider. This is important. Contact a health care provider if:  Your pain continues or gets worse after resting.  You have lower abdominal pain that: ? Comes and goes at regular intervals. ? Spreads to your back. ? Is similar to menstrual cramps.  You have pain or burning when you urinate. Get help right away if:  You have a fever or chills.  You have vaginal bleeding.  You are leaking fluid from your vagina.  You are passing tissue from your vagina.  You have vomiting or diarrhea that lasts for more than 24 hours.  Your baby is moving less than usual.  You feel very weak or faint.  You have shortness of breath.  You develop severe pain in your upper abdomen. Summary  Abdominal pain is common during pregnancy, and has many possible causes.  If you experience abdominal pain during pregnancy, tell your health care provider right away.  Follow your health care provider's home care instructions and keep all follow-up visits as directed. This information is not intended to replace advice given to you by your health care provider. Make sure you discuss any questions you have with your health care  provider. Document Revised: 10/29/2018 Document Reviewed: 10/13/2016 Elsevier Patient Education  2020 [employer]  

## 2019-10-28 LAB — GC/CHLAMYDIA PROBE AMP (~~LOC~~) NOT AT ARMC
Chlamydia: NEGATIVE
Comment: NEGATIVE
Comment: NORMAL
Neisseria Gonorrhea: NEGATIVE

## 2019-11-07 ENCOUNTER — Other Ambulatory Visit (HOSPITAL_COMMUNITY)
Admission: RE | Admit: 2019-11-07 | Discharge: 2019-11-07 | Disposition: A | Discharge status: Home / Self Care | Payer: Medicaid Other | Source: Ambulatory Visit | Attending: Certified Nurse Midwife | Admitting: Certified Nurse Midwife

## 2019-11-07 ENCOUNTER — Ambulatory Visit (INDEPENDENT_AMBULATORY_CARE_PROVIDER_SITE_OTHER): Payer: Medicaid Other | Admitting: Certified Nurse Midwife

## 2019-11-07 ENCOUNTER — Other Ambulatory Visit: Payer: Self-pay

## 2019-11-07 ENCOUNTER — Encounter: Payer: Self-pay | Admitting: Certified Nurse Midwife

## 2019-11-07 VITALS — BP 110/71 | HR 69 | Wt 195.2 lb

## 2019-11-07 DIAGNOSIS — Z3481 Encounter for supervision of other normal pregnancy, first trimester: Secondary | ICD-10-CM | POA: Diagnosis not present

## 2019-11-07 DIAGNOSIS — O99211 Obesity complicating pregnancy, first trimester: Secondary | ICD-10-CM | POA: Diagnosis not present

## 2019-11-07 DIAGNOSIS — O0991 Supervision of high risk pregnancy, unspecified, first trimester: Secondary | ICD-10-CM | POA: Diagnosis not present

## 2019-11-07 DIAGNOSIS — E669 Obesity, unspecified: Secondary | ICD-10-CM

## 2019-11-07 DIAGNOSIS — Z3A11 11 weeks gestation of pregnancy: Secondary | ICD-10-CM

## 2019-11-07 DIAGNOSIS — Z3490 Encounter for supervision of normal pregnancy, unspecified, unspecified trimester: Secondary | ICD-10-CM | POA: Diagnosis not present

## 2019-11-07 DIAGNOSIS — O0990 Supervision of high risk pregnancy, unspecified, unspecified trimester: Secondary | ICD-10-CM | POA: Insufficient documentation

## 2019-11-07 DIAGNOSIS — O99351 Diseases of the nervous system complicating pregnancy, first trimester: Secondary | ICD-10-CM | POA: Diagnosis not present

## 2019-11-07 DIAGNOSIS — G40909 Epilepsy, unspecified, not intractable, without status epilepticus: Secondary | ICD-10-CM

## 2019-11-07 DIAGNOSIS — O99210 Obesity complicating pregnancy, unspecified trimester: Secondary | ICD-10-CM

## 2019-11-07 MED ORDER — BLOOD PRESSURE KIT DEVI: 1.0000 | 0 refills | Status: DC

## 2019-11-07 MED ORDER — ASPIRIN EC 81 MG PO TBEC: 81.0000 mg | DELAYED_RELEASE_TABLET | Freq: Every day | ORAL | 0 refills | Status: DC

## 2019-11-07 NOTE — Progress Notes (Signed)
History:   ED Brittany Hendrix is a 29 y.o. [phone] at 58w6dby LMP being seen today for her first obstetrical visit.  Her obstetrical history is significant for seizure disorder. Patient does intend to breast feed. Pregnancy history fully reviewed.  Patient reports nausea. Patient reports occasional nausea but denies need for medication at this time.       HISTORY: OB History  Gravida Para Term Preterm AB Living  '7 1 1 ' 0 5 1  SAB TAB Ectopic Multiple Live Births  4 1 0 0 1    # Outcome Date GA Lbr Len/2nd Weight Sex Delivery Anes PTL Lv  7 Current           6 SAB 2019     SAB     5 Term 12/27/15 450w6d1:31 / 00:42 8 lb 6 oz (3.799 kg) M Vag-Spont EPI  LIV     Name: Larkey,BOY Nur     Apgar1: 9  Apgar5: 9  4 SAB              Birth Comments: System Generated. Please review and update pregnancy details.  3 SAB           2 SAB           1 TAB             Last pap smear was done 05/2015 and was normal  Past Medical History:  Diagnosis Date  . Abortion history   . Headache(784.0)   [facility]   Last seizure March 2020   Past Surgical History:  Procedure Laterality Date  . DILATION AND CURETTAGE OF UTERUS     abortion  . DILATION AND EVACUATION N/A 10/04/2017   Procedure: DILATATION AND EVACUATION;  Surgeon: RiDelsa BernMD;  [facility];  Service: Gynecology;  Laterality: N/A;   Family History  Problem Relation Age of Onset  . Diabetes Mother   . Asthma Brother    Social History   Tobacco Use  . Smoking status: Current Every Day Smoker    Packs/day: 0.50    Years: 5.00    Pack years: 2.50    Types: Cigarettes  . Smokeless tobacco: Never Used  Substance Use Topics  . Alcohol use: Not Currently    Comment: occasional, not while preg  . Drug use: Yes    Types: Marijuana    Comment: last used last week   No Known Allergies Current Outpatient Medications on File Prior to Visit  Medication Sig Dispense Refill  . levETIRAcetam (KEPPRA) 1000  MG tablet TAKE ONE TABLET BY MOUTH TWICE DAILY (Patient taking differently: Take 1,000 mg by mouth 2 (two) times daily. ) 180 tablet 4  . metroNIDAZOLE (FLAGYL) 500 MG tablet Take 1 tablet (500 mg total) by mouth 2 (two) times daily. 14 tablet 0  . Prenatal Vit-Fe Fumarate-FA (PRENATAL MULTIVITAMIN) TABS tablet Take 1 tablet by mouth daily at 12 noon.     No current facility-administered medications on file prior to visit.    Review of Systems Pertinent items noted in HPI and remainder of comprehensive ROS otherwise negative. Physical Exam:   Vitals:   11/07/19 1305  BP: 110/71  Pulse: 69  Weight: 195 lb 3.2 oz (88.5 kg)   Fetal Heart Rate (bpm): 157-US Pelvic Exam: Perineum: no hemorrhoids, normal perineum   Vulva: normal external genitalia, no lesions   Vagina:  normal mucosa, normal discharge   Cervix: no lesions and normal, pap smear  done.    Adnexa: normal adnexa and no mass, fullness, tenderness   Bony Pelvis: average  System: General: well-developed, well-nourished female in no acute distress   Breasts:  normal appearance, no masses or tenderness bilaterally   Skin: normal coloration and turgor, no rashes   Neurologic: oriented, normal, negative, normal mood   Extremities: normal strength, tone, and muscle mass, ROM of all joints is normal   HEENT PERRLA, extraocular movement intact and sclera clear   Mouth/Teeth mucous membranes moist, pharynx normal without lesions and dental hygiene good   Neck supple and no masses   Cardiovascular: regular rate and rhythm   Respiratory:  no respiratory distress, normal breath sounds   Abdomen: soft, non-tender; bowel sounds normal; no masses,  no organomegaly  Bedside Ultrasound for FHR check: Patient informed that the ultrasound is considered a limited obstetric ultrasound and is not intended to be a complete ultrasound exam.  Patient also informed that the ultrasound is not being completed with the intent of assessing for fetal or  placental anomalies or any pelvic abnormalities.  Explained that the purpose of today's ultrasound is to assess for fetal heart rate.  Patient acknowledges the purpose of the exam and the limitations of the study.      FHR 157 by bedside US   Assessment:    Pregnancy: [id number] Patient Active Problem List   Diagnosis Date Noted  . Supervision of high risk pregnancy, antepartum 11/07/2019  . 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 Vaginal delivery 12/27/2015  . Active labor at term 12/26/2015  . Obesity (BMI 35.0-39.9 without comorbidity) 12/25/2015  . Anemia affecting pregnancy 12/25/2015  . Seizure disorder in pregnancy, antepartum ([place]) 05/28/2015  . Supervision of high-risk pregnancy 05/28/2015  . 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 Supervision of high risk pregnancy, antepartum - Welcomed to practice and introduced self to patient  - Reviewed safety, visitor policy, reassurance about COVID-19 for pregnancy at this time. Discussed possible changes to visits, including televisits, that may occur due to COVID-19.  The office remains open if pt needs to be seen and [facility] is open 24 hours/day for OB emergencies. - Anticipatory guidance on upcoming appointments including mychart visits  - Educated and discussed babyscripts utilization  - Enroll Patient in Babyscripts - Babyscripts Schedule Optimization - Cytology - PAP( [place]) - Hepatitis C Antibody - Culture, OB Urine - Genetic Screening - Obstetric Panel, Including HIV - [place] MFM OB COMP + 14 WK; Future - Blood Pressure Monitoring (BLOOD PRESSURE KIT) DEVI; 1 kit by Does not apply route once a week. Check Blood Pressure regularly and record readings into the Babyscripts App.  Large Cuff.  DX O90.0  Dispense: 1 each; Refill: 0  2. Seizure disorder in pregnancy, antepartum ([place]) - On keppra, followed by [facility]  Neurology  - Patient reports last seizure being 05/25/2019  3. Obesity during pregnancy, antepartum - BMI 33.51 - Educated and discussed recommended weight gain during pregnancy  - bASA to be initiated and started tomorrow  - HgB A1c - aspirin EC 81 MG tablet; Take 1 tablet (81 mg total) by mouth daily. Take after 12 weeks for prevention of preeclampsia later in pregnancy  Dispense: 300 tablet; Refill: 0   Initial labs drawn. Continue prenatal vitamins. Genetic Screening discussed, NIPS: ordered. Ultrasound discussed; fetal anatomic survey: ordered. Problem list reviewed and updated. The nature of [place] [facility]  [place] with multiple MDs and other Advanced Practice Providers was explained to patient; also emphasized that residents, students are part of our team. Routine obstetric precautions reviewed. Return in about 4 weeks (around 12/05/2019) for ROB-mychart.     Lajean Manes, [place] for [employer], [place]

## 2019-11-07 NOTE — Patient Instructions (Signed)
Safe Medications in Pregnancy   Acne: Benzoyl Peroxide Salicylic Acid  Backache/Headache: Tylenol: 2 regular strength every 4 hours OR              2 Extra strength every 6 hours  Colds/Coughs/Allergies: Benadryl (alcohol free) 25 mg every 6 hours as needed Breath right strips Claritin Cepacol throat lozenges Chloraseptic throat spray Cold-Eeze- up to three times per day Cough drops, alcohol free Flonase (by prescription only) Guaifenesin Mucinex Robitussin DM (plain only, alcohol free) Saline nasal spray/drops Sudafed (pseudoephedrine) & Actifed ** use only after [redacted] weeks gestation and if you do not have high blood pressure Tylenol Vicks Vaporub Zinc lozenges Zyrtec   Constipation: Colace Ducolax suppositories Fleet enema Glycerin suppositories Metamucil Milk of magnesia Miralax Senokot Smooth move tea  Diarrhea: Kaopectate Imodium A-D  *NO pepto Bismol  Hemorrhoids: Anusol Anusol HC Preparation H Tucks  Indigestion: Tums Maalox Mylanta Zantac  Pepcid  Insomnia: Benadryl (alcohol free) 25mg every 6 hours as needed Tylenol PM Unisom, no Gelcaps  Leg Cramps: Tums MagGel  Nausea/Vomiting:  Bonine Dramamine Emetrol Ginger extract Sea bands Meclizine  Nausea medication to take during pregnancy:  Unisom (doxylamine succinate 25 mg tablets) Take one tablet daily at bedtime. If symptoms are not adequately controlled, the dose can be increased to a maximum recommended dose of two tablets daily (1/2 tablet in the morning, 1/2 tablet mid-afternoon and one at bedtime). Vitamin B6 100mg tablets. Take one tablet twice a day (up to 200 mg per day).  Skin Rashes: Aveeno products Benadryl cream or 25mg every 6 hours as needed Calamine Lotion 1% cortisone cream  Yeast infection: Gyne-lotrimin 7 Monistat 7   **If taking multiple medications, please check labels to avoid duplicating the same active ingredients **take medication as directed on  the label ** Do not exceed 4000 mg of tylenol in 24 hours **Do not take medications that contain aspirin or ibuprofen     First Trimester of Pregnancy  The first trimester of pregnancy is from week 1 until the end of week 13 (months 1 through 3). During this time, your baby will begin to develop inside you. At 6-8 weeks, the eyes and face are formed, and the heartbeat can be seen on ultrasound. At the end of 12 weeks, all the baby's organs are formed. Prenatal care is all the medical care you receive before the birth of your baby. Make sure you get good prenatal care and follow all of your doctor's instructions. Follow these instructions at home: Medicines  Take over-the-counter and prescription medicines only as told by your doctor. Some medicines are safe and some medicines are not safe during pregnancy.  Take a prenatal vitamin that contains at least 600 micrograms (mcg) of folic acid.  If you have trouble pooping (constipation), take medicine that will make your stool soft (stool softener) if your doctor approves. Eating and drinking   Eat regular, healthy meals.  Your doctor will tell you the amount of weight gain that is right for you.  Avoid raw meat and uncooked cheese.  If you feel sick to your stomach (nauseous) or throw up (vomit): ? Eat 4 or 5 small meals a day instead of 3 large meals. ? Try eating a few soda crackers. ? Drink liquids between meals instead of during meals.  To prevent constipation: ? Eat foods that are high in fiber, like fresh fruits and vegetables, whole grains, and beans. ? Drink enough fluids to keep your pee (urine) clear or pale yellow.   Activity  Exercise only as told by your doctor. Stop exercising if you have cramps or pain in your lower belly (abdomen) or low back.  Do not exercise if it is too hot, too humid, or if you are in a place of great height (high altitude).  Try to avoid standing for long periods of time. Move your legs often  if you must stand in one place for a long time.  Avoid heavy lifting.  Wear low-heeled shoes. Sit and stand up straight.  You can have sex unless your doctor tells you not to. Relieving pain and discomfort  Wear a good support bra if your breasts are sore.  Take warm water baths (sitz baths) to soothe pain or discomfort caused by hemorrhoids. Use hemorrhoid cream if your doctor says it is okay.  Rest with your legs raised if you have leg cramps or low back pain.  If you have puffy, bulging veins (varicose veins) in your legs: ? Wear support hose or compression stockings as told by your doctor. ? Raise (elevate) your feet for 15 minutes, 3-4 times a day. ? Limit salt in your food. Prenatal care  Schedule your prenatal visits by the twelfth week of pregnancy.  Write down your questions. Take them to your prenatal visits.  Keep all your prenatal visits as told by your doctor. This is important. Safety  Wear your seat belt at all times when driving.  Make a list of emergency phone numbers. The list should include numbers for family, friends, the hospital, and police and fire departments. General instructions  Ask your doctor for a referral to a local prenatal class. Begin classes no later than at the start of month 6 of your pregnancy.  Ask for help if you need counseling or if you need help with nutrition. Your doctor can give you advice or tell you where to go for help.  Do not use hot tubs, steam rooms, or saunas.  Do not douche or use tampons or scented sanitary pads.  Do not cross your legs for long periods of time.  Avoid all herbs and alcohol. Avoid drugs that are not approved by your doctor.  Do not use any tobacco products, including cigarettes, chewing tobacco, and electronic cigarettes. If you need help quitting, ask your doctor. You may get counseling or other support to help you quit.  Avoid cat litter boxes and soil used by cats. These carry germs that can  cause birth defects in the baby and can cause a loss of your baby (miscarriage) or stillbirth.  Visit your dentist. At home, brush your teeth with a soft toothbrush. Be gentle when you floss. Contact a doctor if:  You are dizzy.  You have mild cramps or pressure in your lower belly.  You have a nagging pain in your belly area.  You continue to feel sick to your stomach, you throw up, or you have watery poop (diarrhea).  You have a bad smelling fluid coming from your vagina.  You have pain when you pee (urinate).  You have increased puffiness (swelling) in your face, hands, legs, or ankles. Get help right away if:  You have a fever.  You are leaking fluid from your vagina.  You have spotting or bleeding from your vagina.  You have very bad belly cramping or pain.  You gain or lose weight rapidly.  You throw up blood. It may look like coffee grounds.  You are around people who have German measles, fifth disease,   or chickenpox.  You have a very bad headache.  You have shortness of breath.  You have any kind of trauma, such as from a fall or a car accident. Summary  The first trimester of pregnancy is from week 1 until the end of week 13 (months 1 through 3).  To take care of yourself and your unborn baby, you will need to eat healthy meals, take medicines only if your doctor tells you to do so, and do activities that are safe for you and your baby.  Keep all follow-up visits as told by your doctor. This is important as your doctor will have to ensure that your baby is healthy and growing well. This information is not intended to replace advice given to you by your health care provider. Make sure you discuss any questions you have with your health care provider. Document Revised: 11/01/2018 Document Reviewed: 07/19/2016 Elsevier Patient Education  2020 Elsevier Inc.  

## 2019-11-07 NOTE — Progress Notes (Signed)
Patient presents for NOB. Patient has no concerns.

## 2019-11-08 LAB — OBSTETRIC PANEL, INCLUDING HIV
Antibody Screen: NEGATIVE
Basophils Absolute: 0 10*3/uL (ref 0.0–0.2)
Basos: 0 %
EOS (ABSOLUTE): 0.1 10*3/uL (ref 0.0–0.4)
Eos: 1 %
HIV Screen 4th Generation wRfx: NONREACTIVE
Hematocrit: 35.1 % (ref 34.0–46.6)
Hemoglobin: 12 g/dL (ref 11.1–15.9)
Hepatitis B Surface Ag: NEGATIVE
Immature Grans (Abs): 0 10*3/uL (ref 0.0–0.1)
Immature Granulocytes: 0 %
Lymphocytes Absolute: 2 10*3/uL (ref 0.7–3.1)
Lymphs: 26 %
MCH: 32.3 pg (ref 26.6–33.0)
MCHC: 34.2 g/dL (ref 31.5–35.7)
MCV: 95 fL (ref 79–97)
Monocytes Absolute: 0.4 10*3/uL (ref 0.1–0.9)
Monocytes: 6 %
Neutrophils Absolute: 5.3 10*3/uL (ref 1.4–7.0)
Neutrophils: 67 %
Platelets: 244 10*3/uL (ref 150–450)
RBC: 3.71 x10E6/uL — ABNORMAL LOW (ref 3.77–5.28)
RDW: 11.4 % — ABNORMAL LOW (ref 11.7–15.4)
RPR Ser Ql: NONREACTIVE
Rh Factor: POSITIVE
Rubella Antibodies, IGG: 5.46 index (ref >0.99)
WBC: 7.9 10*3/uL (ref 3.4–10.8)

## 2019-11-08 LAB — HEMOGLOBIN A1C
Est. average glucose Bld gHb Est-mCnc: 97 mg/dL
Hgb A1c MFr Bld: 5 % (ref 4.8–5.6)

## 2019-11-08 LAB — HEPATITIS C ANTIBODY: Hep C Virus Ab: <0.1 s/co ratio (ref 0.0–0.9)

## 2019-11-10 LAB — CULTURE, OB URINE

## 2019-11-10 LAB — URINE CULTURE, OB REFLEX

## 2019-11-11 LAB — CYTOLOGY - PAP
Chlamydia: NEGATIVE
Comment: NEGATIVE
Comment: NEGATIVE
Comment: NORMAL
Diagnosis: NEGATIVE
High risk HPV: NEGATIVE
Neisseria Gonorrhea: NEGATIVE

## 2019-11-16 ENCOUNTER — Other Ambulatory Visit: Payer: Self-pay

## 2019-11-16 ENCOUNTER — Encounter: Payer: Self-pay | Admitting: Emergency Medicine

## 2019-11-16 ENCOUNTER — Ambulatory Visit
Admission: EM | Admit: 2019-11-16 | Discharge: 2019-11-16 | Disposition: A | Discharge status: Home / Self Care | Payer: Medicaid Other | Attending: Emergency Medicine | Admitting: Emergency Medicine

## 2019-11-16 DIAGNOSIS — Z20822 Contact with and (suspected) exposure to covid-19: Secondary | ICD-10-CM

## 2019-11-16 HISTORY — DX: Encounter for supervision of normal pregnancy, unspecified, unspecified trimester: Z34.90

## 2019-11-16 NOTE — Discharge Instructions (Signed)
Your COVID test is pending - it is important to quarantine / isolate at home until your results are back. °If you test positive and would like further evaluation for persistent or worsening symptoms, you may schedule an E-visit or virtual (video) visit throughout the [facility] MyChart app or website. ° °PLEASE NOTE: If you develop severe chest pain or shortness of breath please go to the ER or call 9-1-1 for further evaluation --> DO NOT schedule electronic or virtual visits for this. °Please call our office for further guidance / recommendations as needed. ° °For information about the Covid vaccine, please visit [email or web address] °

## 2019-11-16 NOTE — ED Provider Notes (Signed)
[facility]    CSN: [id number] Arrival date & time: 11/16/19  1453      History   Chief Complaint Chief Complaint  Patient presents with  . Covid Exposure    HPI Brittany Hendrix is a 29 y.o. female currently [redacted] weeks gestation  Presenting for Covid testing: Exposure: Mother and brother whom she visits frequently Date of exposure: last exposure was yesterday, though multiple visits in the last 2 weeks. Any fever, symptoms since exposure: no    Past Medical History:  Diagnosis Date  . Abortion history   . Headache(784.0)   . Pregnant   . Seizures [facility])    Last seizure March 2020    Patient Active Problem List   Diagnosis Date Noted  . Supervision of high risk pregnancy, antepartum 11/07/2019  . 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 Vaginal delivery 12/27/2015  . Active labor at term 12/26/2015  . Obesity (BMI 35.0-39.9 without comorbidity) 12/25/2015  . Anemia affecting pregnancy 12/25/2015  . Seizure disorder in pregnancy, antepartum ([place]) 05/28/2015  . Supervision of high-risk pregnancy 05/28/2015  . 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 DILATION AND CURETTAGE OF UTERUS     abortion  . DILATION AND EVACUATION N/A 10/04/2017   Procedure: DILATATION AND EVACUATION;  Surgeon: Delsa Bern, MD;  Location: [facility];  Service: Gynecology;  Laterality: N/A;    OB History    Gravida  7   Para  1   Term  1   Preterm  0   AB  5   Living  1     SAB  4   TAB  1   Ectopic  0   Multiple  0   Live Births  1            Home Medications    Prior to Admission medications   Medication Sig Start Date End Date Taking? Authorizing Provider  levETIRAcetam (KEPPRA) 1000 MG tablet TAKE ONE TABLET BY MOUTH TWICE DAILY Patient taking differently: Take 1,000 mg by mouth 2 (two) times  daily.  11/12/18  Yes Marcial Pacas, MD  Prenatal Vit-Fe Fumarate-FA (PRENATAL MULTIVITAMIN) TABS tablet Take 1 tablet by mouth daily at 12 noon.   Yes [provider]    Family History Family History  Problem Relation Age of Onset  . Diabetes Mother   . Asthma Brother     Social History Social History   Tobacco Use  . Smoking status: Current Every Day Smoker    Packs/day: 0.50    Years: 5.00    Pack years: 2.50    Types: Cigarettes  . Smokeless tobacco: Never Used  Substance Use Topics  . Alcohol use: Not Currently    Comment: occasional, not while preg  . Drug use: Yes    Types: Marijuana    Comment: last used last week     Allergies   Patient has no known allergies.   Review of Systems As per HPI   Physical Exam Triage Vital Signs ED Triage Vitals  Enc Vitals Group     BP      Pulse      Resp      Temp      Temp src      SpO2      Weight      Height      Head Circumference  Peak Flow      Pain Score      Pain Loc      Pain Edu?      Excl. in GC?    No data found.  Updated Vital Signs BP (!) 110/47 (BP Location: Left Arm)   Pulse 79   Temp 98.3 F (36.8 C) (Oral)   Resp 16   LMP 08/16/2019   SpO2 97%   Visual Acuity Right Eye Distance:   Left Eye Distance:   Bilateral Distance:    Right Eye Near:   Left Eye Near:    Bilateral Near:     Physical Exam Constitutional:      General: She is not in acute distress. HENT:     Head: Normocephalic and atraumatic.  Eyes:     General: No scleral icterus.    Pupils: Pupils are equal, round, and reactive to light.  Cardiovascular:     Rate and Rhythm: Normal rate.  Pulmonary:     Effort: Pulmonary effort is normal.  Skin:    Coloration: Skin is not jaundiced or pale.  Neurological:     Mental Status: She is alert and oriented to person, place, and time.      UC Treatments / Results  Labs (all labs ordered are listed, but only abnormal results are displayed) Labs Reviewed   NOVEL CORONAVIRUS, NAA    EKG   Radiology No results found.  Procedures Procedures (including critical care time)  Medications Ordered in UC Medications - No data to display  Initial Impression / Assessment and Plan / UC Course  I have reviewed the triage vital signs and the nursing notes.  Pertinent labs & imaging results that were available during my care of the patient were reviewed by me and considered in my medical decision making (see chart for details).     Patient afebrile, nontoxic, with SpO2 97%.  Covid PCR pending.  Patient to quarantine until results are back.  We will treat supportively as outlined below.  Return precautions discussed, patient verbalized understanding and is agreeable to plan. Final Clinical Impressions(s) / UC Diagnoses   Final diagnoses:  Exposure to COVID-19 virus     Discharge Instructions     Your COVID test is pending - it is important to quarantine / isolate at home until your results are back. If you test positive and would like further evaluation for persistent or worsening symptoms, you may schedule an E-visit or virtual (video) visit throughout the [facility] app or website.  PLEASE NOTE: If you develop severe chest pain or shortness of breath please go to the ER or call 9-1-1 for further evaluation --> DO NOT schedule electronic or virtual visits for this. Please call our office for further guidance / recommendations as needed.  For information about the Covid vaccine, please visit [email or web address]    ED Prescriptions    None     PDMP not reviewed this encounter.   [place], [place], [place] 11/16/19 1544

## 2019-11-16 NOTE — ED Triage Notes (Signed)
Patient is pregnant . Patient states she doesn't feel sick, but has been around mother and brother, mother has tested positive.

## 2019-11-17 LAB — NOVEL CORONAVIRUS, NAA: SARS-CoV-2, NAA: NOT DETECTED

## 2019-11-17 LAB — SARS-COV-2, NAA 2 DAY TAT

## 2019-11-18 ENCOUNTER — Encounter: Payer: Self-pay | Admitting: Certified Nurse Midwife

## 2019-11-25 ENCOUNTER — Ambulatory Visit (INDEPENDENT_AMBULATORY_CARE_PROVIDER_SITE_OTHER): Payer: Medicaid Other | Admitting: Neurology

## 2019-11-25 ENCOUNTER — Ambulatory Visit: Payer: Self-pay | Admitting: Neurology

## 2019-11-25 ENCOUNTER — Encounter: Payer: Self-pay | Admitting: Neurology

## 2019-11-25 ENCOUNTER — Other Ambulatory Visit: Payer: Self-pay

## 2019-11-25 VITALS — BP 114/61 | HR 77 | Temp 97.4°F | Ht 64.0 in | Wt 199.5 lb

## 2019-11-25 DIAGNOSIS — G40909 Epilepsy, unspecified, not intractable, without status epilepticus: Secondary | ICD-10-CM

## 2019-11-25 MED ORDER — LEVETIRACETAM 1000 MG PO TABS: 1000.0000 mg | ORAL_TABLET | Freq: Two times a day (BID) | ORAL | 4 refills | Status: DC

## 2019-11-25 NOTE — Progress Notes (Signed)
PATIENT: Brittany Hendrix DOB: 12-Aug-1990  Chief Complaint  Patient presents with  . Seizures    She is pregnant now with an EDD of 05/25/20. She would like to discuss her seizure medications.      HISTORICAL  Brittany Hendrix is a 29 years old female, with epilepsy disorder, currently taking Keppra 500 in the morning, 1000 mg in the evening. Last seizure was in May 2014.   She had seizures since she was 60-40 years old.  All seizure has similar seminology. She has an aura before those seizures, she feels drowsy and sometimes nauseated, lasting few minutes, followed by loss of conciousness, whole body shaking, then post ictal confusion usually last 20-30 minutes..   She has injured herself in the face and on her leg  during seizures in the past. She was not treated with anti-epilepsicmedications, until after she had a recurrent seizure in May 2012, she was put on Levetiracetam 500 mg b.i.d.   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  her bloodwork tested positive for marijuana.   For a while, she was noncompliant with her medications, also concerned about the medication costs, she continue have recurrent seizures.   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 EEG was normal  UPDATE June 5th 2015: Last visit was in May 2014, she has two recurrent seizure 2 weeks ago, precede by dizziness, fainting sensation, then body jerking movement with LOC, she has two seizures in one day, while taking keppra 500mg  bid, compliance with her medication  UPDATE July 28th 2016: She is now taking Keppra 500 mg in the morning, 1000 mg at evening, no recurrent seizures, she work as a Training and development officer, pay medicine out of her pocket, often times, it is a struggle  UPDATE April 27th 2017: She had one  recurrent seizure at Feb 23rd  2017 when she was [redacted] week pregnant, precede by intermittent body jumping, she has been compliant with her Keppra 500 mg in the morning, 1000 mg at night, she is pregnant with her first son, expected due date is June first 2017, getting keppra filled at [facility].  I reviewed laboratory, normal CMP, mild anemia 10.8,  UPDATE Sept 24 2018: Most recent seizure was on Sept 10 2018, she has aura of body jumpy, body nervous shaky, she gasp for air, LOC, she bite her lips followed by headache, body achy pain  UPDATE Nov 25 2019: She still works as a Training and development officer at [employer], currently pregnant with a boy, she had a healthy delivery in 2017 while taking Keppra 1500 mg daily, reported last seizure was May 24, 2020 following her hollowing party, she was sleep deprived, also had excessive alcohol intake, she has been compliant with her medications  Laboratory evaluation in 2021,  A1c was 5.0, negative hepatitis C, HIV, normal CBC, rheumatoid factor, REVIEW OF SYSTEMS: Full 14 system review of systems performed and notable only for as above All other review of systems were negative.  ALLERGIES: No Known Allergies  HOME MEDICATIONS: Current Outpatient Medications  Medication Sig Dispense Refill  . levETIRAcetam (KEPPRA) 1000 MG tablet Take 1 tablet (1,000 mg total) by mouth 2 (two) times daily. 180 tablet 4  . Prenatal Vit-Fe Fumarate-FA (PRENATAL MULTIVITAMIN) TABS tablet Take 1 tablet by mouth daily at 12 noon.  No current facility-administered medications for this visit.    PAST MEDICAL HISTORY: Past Medical History:  Diagnosis Date  . Abortion history   . Headache(784.0)   . Pregnant   . Seizures [facility])    Last seizure March 2020  . Supervision of high-risk pregnancy 05/28/2015    Clinic HRC Prenatal Labs  Dating 5 week scan Blood type:   A pos  Genetic Screen Normal NT/NB 1 Screen:        AFP:     Antibody: neg  Anatomic [place]  Rubella:  immune  GTT  Early: 124          Third trimester:  RPR:   NR  Flu vaccine Declined HBsAg:   neg  TDaP vaccine                                           HIV: Non Reactive (09/26 0020)   Baby Food Breast                                            PAST SURGICAL HISTORY: Past Surgical History:  Procedure Laterality Date  . DILATION AND CURETTAGE OF UTERUS     abortion  . DILATION AND EVACUATION N/A 10/04/2017   Procedure: DILATATION AND EVACUATION;  Surgeon: Silverio Lay, MD;  Location: [facility];  Service: Gynecology;  Laterality: N/A;    FAMILY HISTORY: Family History  Problem Relation Age of Onset  . Diabetes Mother   . Asthma Brother     SOCIAL HISTORY: Social History   Socioeconomic History  . Marital status: Single    Spouse name: Not on file  . Number of children: 0  . Years of education: 19  . Highest education level: Not on file  Occupational History  . Occupation: Lobbyist: Production manager    Comment: [employer]  Tobacco Use  . Smoking status: Current Every Day Smoker    Packs/day: 0.50    Years: 5.00    Pack years: 2.50    Types: Cigarettes  . Smokeless tobacco: Never Used  Substance and Sexual Activity  . Alcohol use: Not Currently    Comment: occasional, not while preg  . Drug use: Yes    Types: Marijuana    Comment: last used last week  . Sexual activity: Yes    Partners: Male    Birth control/protection: None  Other Topics Concern  . Not on file  Social History Narrative   Patient is single and lives at home alone. Patient works at [employer]. Right handed. Caffeine three daily.   Social Determinants of Health   Financial Resource Strain:   . Difficulty of Paying Living Expenses:   Food Insecurity:   . Worried About Programme researcher, broadcasting/film/video in the Last Year:   . Barista in the Last Year:   Transportation Needs:   . Freight forwarder (Medical):   [name] Lack of Transportation (Non-Medical):   Physical Activity:   . Days of Exercise per Week:   .  Minutes of Exercise per Session:   Stress:   . Feeling of Stress :   Social Connections:   . Frequency of Communication with Friends and Family:   . Frequency of Social Gatherings  with Friends and Family:   . Attends Religious Services:   . Active Member of Clubs or Organizations:   . Attends Banker Meetings:   [name] Marital Status:   Intimate Partner Violence:   . Fear of Current or Ex-Partner:   . Emotionally Abused:   [name] Physically Abused:   . Sexually Abused:      PHYSICAL EXAM   Vitals:   11/25/19 1134  BP: 114/61  Pulse: 77  Temp: (!) 97.4 F (36.3 C)  Weight: 199 lb 8 oz (90.5 kg)  Height: 5\' 4"  (1.626 m)    Not recorded      Body mass index is 34.24 kg/m.  PHYSICAL EXAMNIATION:  Gen: NAD, conversant, well nourised, well groomed                       NEUROLOGICAL EXAM:  MENTAL STATUS: Speech/cognition:  Awake, alert, oriented to history taking and casual conversation  CRANIAL NERVES: CN II: Visual fields are full to confrontation. Pupils are round equal and briskly reactive to light. CN III, IV, VI: extraocular movement are normal. No ptosis. CN V: Facial sensation is intact to light touch CN VII: Face is symmetric with normal eye closure  CN VIII: Hearing is normal to causal conversation. CN IX, X: Phonation is normal. CN XI: Head turning and shoulder shrug are intact  MOTOR: There is no pronator drift of out-stretched arms. Muscle bulk and tone are normal. Muscle strength is normal.  REFLEXES: Reflexes are 2+ and symmetric at the biceps, triceps, knees, and ankles. Plantar responses are flexor.  SENSORY: Intact to light touch, pinprick and vibratory sensation are intact in fingers and toes.  COORDINATION: There is no trunk or limb dysmetria noted.  GAIT/STANCE: Posture is normal. Gait is steady with normal steps, base, arm swing, and turning. Heel and toe walking are normal. Tandem gait is normal.  Romberg is  absent.   DIAGNOSTIC DATA (LABS, IMAGING, TESTING) - I reviewed patient records, labs, notes, testing and imaging myself where available.   ASSESSMENT AND PLAN  Brittany Hendrix is a 29 y.o. female   Epilepsy  Taking Keppra 1000 mg twice daily  Last reported seizure was on May 24, 2020, following her hollowing party, sleep deprived only slept for 2 hours with excessive alcohol intake to the point of drunk,  Has been compliant with medications  She is currently pregnant previously had a healthy delivery of a boy while taking Keppra 1500 mg daily  Keep current dose of Keppra, check level,  EEG  Call clinic for recurrent seizure, will see patient before expected delivery date May 25, 2020   May 27, 2020, M.D. Ph.D.  [facility] [address] [place], [place] Ph: [phone] Fax: [phone]  CC: Referring Provider

## 2019-11-27 LAB — LEVETIRACETAM LEVEL: Levetiracetam Lvl: 26.9 ug/mL (ref 10.0–40.0)

## 2019-11-27 LAB — TSH: TSH: 0.541 u[IU]/mL (ref 0.450–4.500)

## 2019-12-05 ENCOUNTER — Telehealth (INDEPENDENT_AMBULATORY_CARE_PROVIDER_SITE_OTHER): Payer: Medicaid Other | Admitting: Advanced Practice Midwife

## 2019-12-05 ENCOUNTER — Encounter (INDEPENDENT_AMBULATORY_CARE_PROVIDER_SITE_OTHER): Payer: Self-pay

## 2019-12-05 VITALS — BP 111/57 | HR 73

## 2019-12-05 DIAGNOSIS — O99350 Diseases of the nervous system complicating pregnancy, unspecified trimester: Secondary | ICD-10-CM

## 2019-12-05 DIAGNOSIS — O0990 Supervision of high risk pregnancy, unspecified, unspecified trimester: Secondary | ICD-10-CM

## 2019-12-05 DIAGNOSIS — O99352 Diseases of the nervous system complicating pregnancy, second trimester: Secondary | ICD-10-CM

## 2019-12-05 DIAGNOSIS — O2622 Pregnancy care for patient with recurrent pregnancy loss, second trimester: Secondary | ICD-10-CM

## 2019-12-05 DIAGNOSIS — Z3A15 15 weeks gestation of pregnancy: Secondary | ICD-10-CM

## 2019-12-05 DIAGNOSIS — O99212 Obesity complicating pregnancy, second trimester: Secondary | ICD-10-CM

## 2019-12-05 NOTE — Progress Notes (Signed)
Patient reports that she started feeling a little bit of fetal movement, denies pain.

## 2019-12-05 NOTE — Progress Notes (Signed)
   OBSTETRICS PRENATAL VIRTUAL VISIT ENCOUNTER NOTE  Provider location: [facility] [place]   I connected with Brittany Hendrix on 12/05/19 at  9:55 AM EDT by MyChart Video Encounter at home and verified that I am speaking with the correct person using two identifiers.   I discussed the limitations, risks, security and privacy concerns of performing an evaluation and management service virtually and the availability of in person appointments. I also discussed with the patient that there may be a patient responsible charge related to this service. The patient expressed understanding and agreed to proceed. Subjective:  Brittany Hendrix is a 29 y.o. [phone] at [redacted]w[redacted]d being seen today for ongoing prenatal care.  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  Patient reports no complaints.  Contractions: Not present. Vag. Bleeding: None.  Movement: Present. Denies any leaking of fluid.   The following portions of the patient's history were reviewed and updated as appropriate: allergies, current medications, past family history, past medical history, past social history, past surgical history and problem list.   Objective:   Vitals:   12/05/19 0918  BP: (!) 111/57  Pulse: 73    Fetal Status:     Movement: Present     General:  Alert, oriented and cooperative. Patient is in no acute distress.  Respiratory: Normal respiratory effort, no problems with respiration noted  Mental Status: Normal mood and affect. Normal behavior. Normal judgment and thought content.  Rest of physical exam deferred due to type of  encounter  Imaging: No results found.  Assessment and Plan:  Pregnancy: L2G4010 at 106w6d 1. Supervision of high risk pregnancy, antepartum --Pt reports fetal movement, denies cramping, LOF, or vaginal bleeding --Anticipatory guidance about next visits/weeks of pregnancy given.  --Next visit in 4 weeks, virtual  2. Seizure disorder in pregnancy, antepartum (HCC) --Stable on Keppra, sees [facility] --Last seizure October 2020  Preterm labor symptoms and general obstetric precautions including but not limited to vaginal bleeding, contractions, leaking of fluid and fetal movement were reviewed in detail with the patient. I discussed the assessment and treatment plan with the patient. The patient was provided an opportunity to ask questions and all were answered. The patient agreed with the plan and demonstrated an understanding of the instructions. The patient was advised to call back or seek an in-person office evaluation/go to MAU at [facility] for any urgent or concerning symptoms. Please refer to After Visit Summary for other counseling recommendations.   I provided 10 minutes of face-to-face time during this encounter.  No follow-ups on file.  Future Appointments  Date Time Provider Department Center  12/27/2019 10:45 AM [facility] US5 [facility]  01/13/2020 12:45 PM Jeanine Luz, RPSGT [facility] None  04/29/2020  9:45 AM Glean Salvo, NP [facility] None    Sharen Counter, CNM Center for [employer], [facility]

## 2019-12-27 ENCOUNTER — Ambulatory Visit: Payer: Medicaid Other | Attending: Certified Nurse Midwife

## 2019-12-27 ENCOUNTER — Other Ambulatory Visit: Payer: Self-pay | Admitting: *Deleted

## 2019-12-27 ENCOUNTER — Other Ambulatory Visit: Payer: Self-pay

## 2019-12-27 ENCOUNTER — Other Ambulatory Visit: Payer: Self-pay | Admitting: Certified Nurse Midwife

## 2019-12-27 DIAGNOSIS — E669 Obesity, unspecified: Secondary | ICD-10-CM

## 2019-12-27 DIAGNOSIS — G40909 Epilepsy, unspecified, not intractable, without status epilepticus: Secondary | ICD-10-CM

## 2019-12-27 DIAGNOSIS — O0990 Supervision of high risk pregnancy, unspecified, unspecified trimester: Secondary | ICD-10-CM

## 2019-12-27 DIAGNOSIS — Z363 Encounter for antenatal screening for malformations: Secondary | ICD-10-CM

## 2019-12-27 DIAGNOSIS — O99212 Obesity complicating pregnancy, second trimester: Secondary | ICD-10-CM | POA: Diagnosis not present

## 2019-12-27 DIAGNOSIS — Z3A19 19 weeks gestation of pregnancy: Secondary | ICD-10-CM

## 2019-12-27 DIAGNOSIS — Z362 Encounter for other antenatal screening follow-up: Secondary | ICD-10-CM

## 2019-12-27 DIAGNOSIS — O99352 Diseases of the nervous system complicating pregnancy, second trimester: Secondary | ICD-10-CM

## 2020-01-02 ENCOUNTER — Telehealth (INDEPENDENT_AMBULATORY_CARE_PROVIDER_SITE_OTHER): Payer: Medicaid Other

## 2020-01-02 VITALS — BP 104/62 | HR 93

## 2020-01-02 DIAGNOSIS — E669 Obesity, unspecified: Secondary | ICD-10-CM

## 2020-01-02 DIAGNOSIS — R0981 Nasal congestion: Secondary | ICD-10-CM

## 2020-01-02 DIAGNOSIS — O0992 Supervision of high risk pregnancy, unspecified, second trimester: Secondary | ICD-10-CM

## 2020-01-02 DIAGNOSIS — O99350 Diseases of the nervous system complicating pregnancy, unspecified trimester: Secondary | ICD-10-CM

## 2020-01-02 DIAGNOSIS — Z3A19 19 weeks gestation of pregnancy: Secondary | ICD-10-CM

## 2020-01-02 DIAGNOSIS — O99210 Obesity complicating pregnancy, unspecified trimester: Secondary | ICD-10-CM

## 2020-01-02 DIAGNOSIS — O0990 Supervision of high risk pregnancy, unspecified, unspecified trimester: Secondary | ICD-10-CM

## 2020-01-02 DIAGNOSIS — O99352 Diseases of the nervous system complicating pregnancy, second trimester: Secondary | ICD-10-CM

## 2020-01-02 DIAGNOSIS — O99212 Obesity complicating pregnancy, second trimester: Secondary | ICD-10-CM

## 2020-01-02 DIAGNOSIS — G40909 Epilepsy, unspecified, not intractable, without status epilepticus: Secondary | ICD-10-CM

## 2020-01-02 MED ORDER — PSEUDOEPHEDRINE HCL 30 MG PO TABS: 30.0000 mg | ORAL_TABLET | ORAL | 1 refills | Status: DC | PRN

## 2020-01-02 MED ORDER — ASPIRIN EC 81 MG PO TBEC
81.0000 mg | DELAYED_RELEASE_TABLET | Freq: Every day | ORAL | 2 refills | Status: DC
Start: 2020-01-02 — End: 2020-05-23

## 2020-01-02 MED ORDER — CETIRIZINE HCL 10 MG PO TABS: 10.0000 mg | ORAL_TABLET | Freq: Every day | ORAL | 1 refills | Status: DC

## 2020-01-02 MED ORDER — ACETAMINOPHEN 325 MG PO TABS: 650.0000 mg | ORAL_TABLET | Freq: Four times a day (QID) | ORAL | 1 refills | Status: DC | PRN

## 2020-01-02 NOTE — Progress Notes (Signed)
OBSTETRICS PRENATAL VIRTUAL VISIT ENCOUNTER NOTE  Provider location: [facility] at [place]   I connected with Brittany Hendrix on 01/02/20 at 10:55 AM EDT by MyChart Video Encounter at home and verified that I am speaking with the correct person using two identifiers.   I discussed the limitations, risks, security and privacy concerns of performing an evaluation and management service virtually and the availability of in person appointments. I also discussed with the patient that there may be a patient responsible charge related to this service. The patient expressed understanding and agreed to proceed. Subjective:  Brittany Hendrix is a 29 y.o. [phone] at [redacted]w[redacted]d being seen today for ongoing prenatal care.  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  Patient reports runny nose, itchy eyes, sneezing, and cough for the past 3 days.  She states she has not taken any medications for her symptoms. She denies a history of allergies and reports a current headache.   Contractions: Not present. Vag. Bleeding: None.  Movement: Present. Denies any leaking of fluid.   The following portions of the patient's history were reviewed and updated as appropriate: allergies, current medications, past family history, past medical history, past social history, past surgical history and problem list.   Objective:   Vitals:   01/02/20 1004  BP: 104/62  Pulse: 93    Fetal Status:     Movement: Present     General:  Alert, oriented and cooperative. Patient is in no acute distress.  Respiratory: Normal  respiratory effort, no problems with respiration noted  Mental Status: Normal mood and affect. Normal behavior. Normal judgment and thought content.  Rest of physical exam deferred due to type of encounter  Imaging: [place] MFM OB DETAIL +14 WK  Result Date: 12/27/2019 ----------------------------------------------------------------------  OBSTETRICS REPORT                       (Signed Final 12/27/2019 12:51 pm) ---------------------------------------------------------------------- Patient Info  ID #:       [id number]                          D.O.B.:  May 20, 1991 (28 yrs)  Name:       Brittany Hendrix               Visit Date: 12/27/2019 11:34 am ---------------------------------------------------------------------- Performed By  Attending:        Noralee Space MD        Secondary Phy.:   [facility]  Performed By:     Marcellina Millin          Address:          [address]  [place]  Referred By:      Katheran James             [facility]         [facility]                               Fetal Care  Ref. Address:     Faculty ---------------------------------------------------------------------- Orders  #  Description                           Code        Ordered By  1  [place] MFM OB DETAIL +14 [place]               [id number]    Darrol Poke ----------------------------------------------------------------------  #  Order #                     Accession #                Episode #  1  [id number]                   [id number]                 [id number] ---------------------------------------------------------------------- Indications  Seizure disorder (Keppra)                      O99.350 G40.909  [redacted] weeks gestation  of pregnancy                Z3A.19  Encounter for antenatal screening for          Z36.3  malformations (low risk NIPS)  Obesity complicating pregnancy, second         O99.212  trimester ---------------------------------------------------------------------- Fetal Evaluation  Num Of Fetuses:         1  Fetal Heart Rate(bpm):  144  Cardiac Activity:       Observed  Presentation:           Cephalic  Placenta:               Posterior  P. Cord Insertion:      Visualized  Amniotic Fluid  AFI FV:      Within normal limits ---------------------------------------------------------------------- Biometry  BPD:      42.7  mm     G. Age:  18w 6d         47  %    CI:        74.35   %  70 - 86                                                          FL/HC:      17.9   %    16.1 - 18.3  HC:      157.2  mm     G. Age:  18w 4d         24  %    HC/AC:      1.17        1.09 - 1.39  AC:      134.2  mm     G. Age:  18w 6d         42  %    FL/BPD:     66.0   %  FL:       28.2  mm     G. Age:  18w 4d         30  %    FL/AC:      21.0   %    20 - 24  HUM:      26.6  mm     G. Age:  18w 3d         34  %  CER:      22.4  mm     G. Age:  21w 1d       > 95  %  NFT:       4.7  mm  CM:          5  mm  Est. FW:     256  gm      0 lb 9 oz     32  % ---------------------------------------------------------------------- OB History  Gravidity:    7         Term:   1        Prem:   0        SAB:   4  TOP:          1       Ectopic:  0        Living: 1 ---------------------------------------------------------------------- Gestational Age  LMP:           19w 0d        Date:  08/16/19                 EDD:   05/22/20  U/S Today:     18w 5d                                        EDD:   05/24/20  Best:          19w 0d     Det. By:  LMP  (08/16/19)          EDD:   05/22/20 ---------------------------------------------------------------------- Anatomy  Cranium:               Appears normal         Aortic Arch:            Appears normal  Cavum:                  Appears normal  Ductal Arch:            Appears normal  Ventricles:            Appears normal         Diaphragm:              Appears normal  Choroid Plexus:        Appears normal         Stomach:                Appears normal, left                                                                        sided  Cerebellum:            Appears normal         Abdomen:                Appears normal  Posterior Fossa:       Appears normal         Abdominal Wall:         Appears nml (cord                                                                        insert, abd wall)  Nuchal Fold:           Appears normal         Cord Vessels:           Appears normal (3                                                                        vessel cord)  Face:                  Appears normal         Kidneys:                Appear normal                         (orbits and profile)  Lips:                  Appears normal         Bladder:                Appears normal  Thoracic:              Appears normal         Spine:                  Appears normal  Heart:  Appears normal         Upper Extremities:      Appears normal                         (4CH, axis, and                         situs)  RVOT:                  Appears normal         Lower Extremities:      Appears normal  LVOT:                  Appears normal  Other:  Heels and left 5th digit visualized. 3VV and 3VTV visualized. ---------------------------------------------------------------------- Cervix Uterus Adnexa  Cervix  Length:            4.2  cm.  Normal appearance by transabdominal scan.  Uterus  No abnormality visualized.  Right Ovary  Within normal limits.  Left Ovary  Within normal limits.  Cul De Sac  No free fluid seen.  Adnexa  No abnormality visualized. ---------------------------------------------------------------------- Impression  We performed fetal anatomy scan. No makers of  aneuploidies or fetal structural defects are seen. Fetal   biometry is consistent with her previously-established dates.  Amniotic fluid is normal and good fetal activity is seen.  Patient understands the limitations of ultrasound in detecting  fetal anomalies.  On cell-free fetal DNA screening, the risks of fetal  aneuploidies are not increased .  Patient has epilepsy and takes keppra 1,000 mg twice daily.  Her last seizure was in October 2020. ---------------------------------------------------------------------- Recommendations  -An appointment was made for her to return in 6 weeks for  fetal growth assessment. ----------------------------------------------------------------------                  Noralee Spaceavi Shankar, MD Electronically Signed Final Report   12/27/2019 12:51 pm ----------------------------------------------------------------------   Assessment and Plan:  Pregnancy: [id number] at 2263w6d 1. Supervision of high risk pregnancy, antepartum -Reviewed ultrasound findings and need for growth US in 6 weeks. -Anticipatory guidance for upcoming appts.   2. Seizure disorder in pregnancy, antepartum (HCC) -Keppra daily -F/B [facility] -Patient reports last appt was one month ago and everything went well.   3. Obesity during pregnancy, antepartum -Not taking baby aspirin -Encouraged to take baby aspirin and discussed recommendation.  -Patient verbalizes understanding and requests meds to pharmacy.   4. Sinus congestion vs Allergies -Reviewed treatment of symptoms for sinus/allergy symptoms. *Encouraged hydration and rest. *Will give OOW excuse for today and tomorrow. *Rx for tylenol, sudafed, and zyrtec. *Reviewed medication regime.    Preterm labor symptoms and general obstetric precautions including but not limited to vaginal bleeding, contractions, leaking of fluid and fetal movement were reviewed in detail with the patient. I discussed the assessment and treatment plan with the patient. The patient was provided an opportunity to ask  questions and all were answered. The patient agreed with the plan and demonstrated an understanding of the instructions. The patient was advised to call back or seek an in-person office evaluation/[facility] for any urgent or concerning symptoms. Please refer to After Visit Summary for other counseling recommendations.   I provided 11 minutes of face-to-face time during this encounter.  No follow-ups on file.  Future Appointments  Date Time Provider Department Center  01/02/2020 10:55 AM Shelly CossEmly,  Shanda Bumps, CNM [facility] None  01/13/2020 12:45 PM Jeanine Luz, RPSGT [facility] None  02/07/2020  9:45 AM [facility] NURSE [facility] [place]  02/07/2020  9:45 AM [facility] US5 [facility]  04/29/2020  9:45 AM Christia Reading, Lindalou Hose, NP [facility] None    Cherre Robins, CNM [facility] for [employer], [facility]

## 2020-01-02 NOTE — Progress Notes (Signed)
Virtual ROB   CC: Sinus infection, stuffy nose, sneezing, coughing,running leaky eyes denies fever, sore throat sx's x 3 days now.     Pt able to check B/P while on phone.

## 2020-01-02 NOTE — Patient Instructions (Addendum)
Allergic Rhinitis, Adult Allergic rhinitis is an allergic reaction that affects the mucous membrane inside the nose. It causes sneezing, a runny or stuffy nose, and the feeling of mucus going down the back of the throat (postnasal drip). Allergic rhinitis can be mild to severe. There are two types of allergic rhinitis:  Seasonal. This type is also called hay fever. It happens only during certain seasons.  Perennial. This type can happen at any time of the year. What are the causes? This condition happens when the body's defense system (immune system) responds to certain harmless substances called allergens as though they were germs.  Seasonal allergic rhinitis is triggered by pollen, which can come from grasses, trees, and weeds. Perennial allergic rhinitis may be caused by:  House dust mites.  Pet dander.  Mold spores. What are the signs or symptoms? Symptoms of this condition include:  Sneezing.  Runny or stuffy nose (nasal congestion).  Postnasal drip.  Itchy nose.  Tearing of the eyes.  Trouble sleeping.  Daytime sleepiness. How is this diagnosed? This condition may be diagnosed based on:  Your medical history.  A physical exam.  Tests to check for related conditions, such as: ? Asthma. ? Pink eye. ? Ear infection. ? Upper respiratory infection.  Tests to find out which allergens trigger your symptoms. These may include skin or blood tests. How is this treated? There is no cure for this condition, but treatment can help control symptoms. Treatment may include:  Taking medicines that block allergy symptoms, such as antihistamines. Medicine may be given as a shot, nasal spray, or pill.  Avoiding the allergen.  Desensitization. This treatment involves getting ongoing shots until your body becomes less sensitive to the allergen. This treatment may be done if other treatments do not help.  If taking medicine and avoiding the allergen does not work, new, stronger  medicines may be prescribed. Follow these instructions at home:  Find out what you are allergic to. Common allergens include smoke, dust, and pollen.  Avoid the things you are allergic to. These are some things you can do to help avoid allergens: ? Replace carpet with wood, tile, or vinyl flooring. Carpet can trap dander and dust. ? Do not smoke. Do not allow smoking in your home. ? Change your heating and air conditioning filter at least once a month. ? During allergy season:  Keep windows closed as much as possible.  Plan outdoor activities when pollen counts are lowest. This is usually during the evening hours.  When coming indoors, change clothing and shower before sitting on furniture or bedding.  Take over-the-counter and prescription medicines only as told by your health care provider.  Keep all follow-up visits as told by your health care provider. This is important. Contact a health care provider if:  You have a fever.  You develop a persistent cough.  You make whistling sounds when you breathe (you wheeze).  Your symptoms interfere with your normal daily activities. Get help right away if:  You have shortness of breath. Summary  This condition can be managed by taking medicines as directed and avoiding allergens.  Contact your health care provider if you develop a persistent cough or fever.  During allergy season, keep windows closed as much as possible. This information is not intended to replace advice given to you by your health care provider. Make sure you discuss any questions you have with your health care provider. Document Revised: 06/23/2017 Document Reviewed: 08/18/2016 Elsevier Patient Education  2020   Elsevier Inc[employer]  Safe Medications in Pregnancy   Acne: Benzoyl Peroxide Salicylic Acid  Backache/Headache: Tylenol: 2 regular strength every 4 hours OR              2 Extra strength every 6 hours  Colds/Coughs/Allergies: Benadryl (alcohol free)  25 mg every 6 hours as needed Breath right strips Claritin Cepacol throat lozenges Chloraseptic throat spray Cold-Eeze- up to three times per day Cough drops, alcohol free Flonase (by prescription only) Guaifenesin Mucinex Robitussin DM (plain only, alcohol free) Saline nasal spray/drops Sudafed (pseudoephedrine) & Actifed ** use only after [redacted] weeks gestation and if you do not have high blood pressure Tylenol Vicks Vaporub Zinc lozenges Zyrtec   Constipation: Colace Ducolax suppositories Fleet enema Glycerin suppositories Metamucil Milk of magnesia Miralax Senokot Smooth move tea  Diarrhea: Kaopectate Imodium A-D  *NO pepto Bismol  Hemorrhoids: Anusol Anusol HC Preparation H Tucks  Indigestion: Tums Maalox Mylanta Zantac  Pepcid  Insomnia: Benadryl (alcohol free) 25mg  every 6 hours as needed Tylenol PM Unisom, no Gelcaps  Leg Cramps: Tums MagGel  Nausea/Vomiting:  Bonine Dramamine Emetrol Ginger extract Sea bands Meclizine  Nausea medication to take during pregnancy:  Unisom (doxylamine succinate 25 mg tablets) Take one tablet daily at bedtime. If symptoms are not adequately controlled, the dose can be increased to a maximum recommended dose of two tablets daily (1/2 tablet in the morning, 1/2 tablet mid-afternoon and one at bedtime). Vitamin B6 100mg  tablets. Take one tablet twice a day (up to 200 mg per day).  Skin Rashes: Aveeno products Benadryl cream or 25mg  every 6 hours as needed Calamine Lotion 1% cortisone cream  Yeast infection: Gyne-lotrimin 7 Monistat 7  Gum/tooth pain: Anbesol  **If taking multiple medications, please check labels to avoid duplicating the same active ingredients **take medication as directed on the label ** Do not exceed 4000 mg of tylenol in 24 hours **Do not take medications that contain aspirin or ibuprofen

## 2020-01-13 ENCOUNTER — Encounter: Payer: Self-pay | Admitting: Neurology

## 2020-01-13 ENCOUNTER — Other Ambulatory Visit: Payer: Medicaid Other

## 2020-01-17 ENCOUNTER — Other Ambulatory Visit: Payer: Self-pay

## 2020-01-17 ENCOUNTER — Encounter: Payer: Self-pay | Admitting: Obstetrics

## 2020-01-17 MED ORDER — DOXYLAMINE-PYRIDOXINE 10-10 MG PO TBEC: 1.0000 | DELAYED_RELEASE_TABLET | Freq: Once | ORAL | 1 refills | Status: AC

## 2020-01-30 ENCOUNTER — Other Ambulatory Visit: Payer: Self-pay

## 2020-01-30 ENCOUNTER — Encounter: Payer: Self-pay | Admitting: Obstetrics & Gynecology

## 2020-01-30 ENCOUNTER — Telehealth (INDEPENDENT_AMBULATORY_CARE_PROVIDER_SITE_OTHER): Payer: Medicaid Other | Admitting: Obstetrics & Gynecology

## 2020-01-30 DIAGNOSIS — G40909 Epilepsy, unspecified, not intractable, without status epilepticus: Secondary | ICD-10-CM

## 2020-01-30 DIAGNOSIS — O99212 Obesity complicating pregnancy, second trimester: Secondary | ICD-10-CM

## 2020-01-30 DIAGNOSIS — O0992 Supervision of high risk pregnancy, unspecified, second trimester: Secondary | ICD-10-CM | POA: Diagnosis not present

## 2020-01-30 DIAGNOSIS — Z8759 Personal history of other complications of pregnancy, childbirth and the puerperium: Secondary | ICD-10-CM

## 2020-01-30 DIAGNOSIS — Z3A23 23 weeks gestation of pregnancy: Secondary | ICD-10-CM

## 2020-01-30 DIAGNOSIS — O99352 Diseases of the nervous system complicating pregnancy, second trimester: Secondary | ICD-10-CM

## 2020-01-30 DIAGNOSIS — O0990 Supervision of high risk pregnancy, unspecified, unspecified trimester: Secondary | ICD-10-CM

## 2020-01-30 DIAGNOSIS — O99350 Diseases of the nervous system complicating pregnancy, unspecified trimester: Secondary | ICD-10-CM

## 2020-01-30 DIAGNOSIS — E669 Obesity, unspecified: Secondary | ICD-10-CM

## 2020-01-30 MED ORDER — METOCLOPRAMIDE HCL 5 MG PO TABS: 5.0000 mg | ORAL_TABLET | Freq: Three times a day (TID) | ORAL | 1 refills | Status: DC

## 2020-01-30 MED ORDER — PANTOPRAZOLE SODIUM 40 MG PO TBEC: 40.0000 mg | DELAYED_RELEASE_TABLET | Freq: Every day | ORAL | 1 refills | Status: DC

## 2020-01-30 NOTE — Progress Notes (Signed)
   TELEHEALTH OBSTETRICS VISIT ENCOUNTER NOTE  I connected with [name] on 01/30/20 at 11:00 AM EDT by telephone at home and verified that I am speaking with the correct person using two identifiers.   I discussed the limitations, risks, security and privacy concerns of performing an evaluation and management service by telephone and the availability of in person appointments. I also discussed with the patient that there may be a patient responsible charge related to this service. The patient expressed understanding and agreed to proceed.  Subjective:  Brittany Hendrix is a 29 y.o. [phone] at [redacted]w[redacted]d being followed for ongoing prenatal care.  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  Patient reports heartburn, nausea and vomiting. Reports fetal movement. Denies any contractions, bleeding or leaking of fluid.   The following portions of the patient's history were reviewed and updated as appropriate: allergies, current medications, past family history, past medical history, past social history, past surgical history and problem list.   Objective:   General:  Alert, oriented and cooperative.   Mental Status: Normal mood and affect perceived. Normal judgment and thought content.  Rest of physical exam deferred due to type of encounter  Assessment and Plan:  Pregnancy: G7P1051 at [redacted]w[redacted]d 1. Supervision of high risk pregnancy, antepartum Blood tinged emesis, reflux - pantoprazole (PROTONIX) 40 MG tablet; Take 1 tablet (40 mg total) by mouth daily.  Dispense: 30 tablet; Refill: 1 - metoCLOPramide  (REGLAN) 5 MG tablet; Take 1 tablet (5 mg total) by mouth 4 (four) times daily -  before meals and at bedtime.  Dispense: 30 tablet; Refill: 1  2. Seizure disorder in pregnancy, antepartum (HCC) Keppra  3. Obesity (BMI 35.0-39.9 without comorbidity)   Preterm labor symptoms and general obstetric precautions including but not limited to vaginal bleeding, contractions, leaking of fluid and fetal movement were reviewed in detail with the patient.  I discussed the assessment and treatment plan with the patient. The patient was provided an opportunity to ask questions and all were answered. The patient agreed with the plan and demonstrated an understanding of the instructions. The patient was advised to call back or seek an in-person office evaluation/go to [facility] at [facility] for any urgent or concerning symptoms. Please refer to After Visit Summary for other counseling recommendations.   I provided 15 minutes of non-face-to-face time during this encounter.  Return in about 4 weeks (around 02/27/2020) for 2 hr GTT.  Future Appointments  Date Time Provider Department Center  02/07/2020  9:45 AM [facility] NURSE [facility]  02/07/2020  9:45 AM [facility] US5 [facility]  04/29/2020  9:45 AM Christia Reading Lindalou Hose, NP [facility] None    Scheryl Darter, MD Center [facility], [facility]

## 2020-01-30 NOTE — Patient Instructions (Signed)

## 2020-01-30 NOTE — Progress Notes (Signed)
Virtual Visit via Telephone Note  I connected with Brittany Hendrix on 01/30/20 at 11:00 AM EDT by telephone and verified that I am speaking with the correct person using two identifiers.  Brittany Hendrix states no complaints today Pt unable to check BP today

## 2020-02-07 ENCOUNTER — Ambulatory Visit: Payer: Medicaid Other | Admitting: *Deleted

## 2020-02-07 ENCOUNTER — Other Ambulatory Visit: Payer: Self-pay | Admitting: *Deleted

## 2020-02-07 ENCOUNTER — Ambulatory Visit: Payer: Medicaid Other | Attending: Obstetrics and Gynecology

## 2020-02-07 ENCOUNTER — Other Ambulatory Visit: Payer: Self-pay

## 2020-02-07 DIAGNOSIS — O99212 Obesity complicating pregnancy, second trimester: Secondary | ICD-10-CM

## 2020-02-07 DIAGNOSIS — O99353 Diseases of the nervous system complicating pregnancy, third trimester: Secondary | ICD-10-CM

## 2020-02-07 DIAGNOSIS — O0990 Supervision of high risk pregnancy, unspecified, unspecified trimester: Secondary | ICD-10-CM | POA: Diagnosis present

## 2020-02-07 DIAGNOSIS — G40909 Epilepsy, unspecified, not intractable, without status epilepticus: Secondary | ICD-10-CM

## 2020-02-07 DIAGNOSIS — Z362 Encounter for other antenatal screening follow-up: Secondary | ICD-10-CM | POA: Insufficient documentation

## 2020-02-07 DIAGNOSIS — O99352 Diseases of the nervous system complicating pregnancy, second trimester: Secondary | ICD-10-CM

## 2020-02-07 DIAGNOSIS — Z3A25 25 weeks gestation of pregnancy: Secondary | ICD-10-CM

## 2020-02-07 DIAGNOSIS — E669 Obesity, unspecified: Secondary | ICD-10-CM

## 2020-03-04 ENCOUNTER — Other Ambulatory Visit: Payer: Medicaid Other

## 2020-03-04 ENCOUNTER — Encounter: Payer: Medicaid Other | Admitting: Obstetrics

## 2020-03-06 ENCOUNTER — Telehealth: Payer: Self-pay | Admitting: Specialist

## 2020-03-06 NOTE — Telephone Encounter (Signed)
   THU BAGGETT DOB: 12/03/1990 MRN: [id number]   RIDER WAIVER AND RELEASE OF LIABILITY  For purposes of improving physical access to our facilities, [facility] is pleased to partner with third parties to provide [facility] patients or other authorized individuals the option of convenient, on-demand ground transportation services (the [employer]") through use of the technology service that enables users to request on-demand ground transportation from independent third-party providers.  By opting to use and accept these [employer], I, the undersigned, hereby agree on behalf of myself, and on behalf of any minor child using the [employer] for whom I am the parent or legal guardian, as follows:  1. Science writer provided to me are provided by independent third-party transportation providers who are not [employer] or employees and who are unaffiliated with [employer]. 2. [facility] is neither a transportation carrier nor a common or public carrier. 3. [facility] has no control over the quality or safety of the transportation that occurs as a result of the [employer]. 4. [facility] cannot guarantee that any third-party transportation provider will complete any arranged transportation service. 5. [facility] makes no representation, warranty, or guarantee regarding the reliability, timeliness, quality, safety, suitability, or availability of any of the Transport Services or that they will be error free. 6. I fully understand that traveling by vehicle involves risks and dangers of serious bodily injury, including permanent disability, paralysis, and death. I agree, on behalf of myself and on behalf of any minor child using the Transport Services for whom I am the parent or legal guardian, that the entire risk arising out of my use of the [employer] remains solely with me, to the maximum extent permitted under applicable law. 7. The [employer] are provided "as is" and "as available." [facility] disclaims all representations and warranties, express, implied or statutory, not expressly set out in these terms, including the implied warranties of merchantability and fitness for a particular purpose. 8. I hereby waive and release [facility], its agents, employees, officers, directors, representatives, insurers, attorneys, assigns, successors, subsidiaries, and affiliates from any and all past, present, or future claims, demands, liabilities, actions, causes of action, or suits of any kind directly or indirectly arising from acceptance and use of the [employer]. 9. I further waive and release [facility] and its affiliates from all present and future liability and responsibility for any injury or death to persons or damages to property caused by or related to the use of the [employer]. 10. I have read this Waiver and Release of Liability, and I understand the terms used in it and their legal significance. This Waiver is freely and voluntarily given with the understanding that my right (as well as the right of any minor child for whom I am the parent or legal guardian using the [employer]) to legal recourse against [facility] in connection with the [employer] is knowingly surrendered in return for use of these services.   I attest that I read the consent document to [name], gave Ms. Yerby the opportunity to ask questions and answered the questions asked (if any). I affirm that [name] then provided consent for she's participation in this program.     Launa Grill

## 2020-03-13 ENCOUNTER — Ambulatory Visit (INDEPENDENT_AMBULATORY_CARE_PROVIDER_SITE_OTHER): Payer: Medicaid Other | Admitting: Obstetrics

## 2020-03-13 ENCOUNTER — Other Ambulatory Visit: Payer: Medicaid Other

## 2020-03-13 ENCOUNTER — Other Ambulatory Visit: Payer: Self-pay

## 2020-03-13 ENCOUNTER — Encounter: Payer: Self-pay | Admitting: Obstetrics

## 2020-03-13 VITALS — BP 104/61 | HR 67 | Wt 206.0 lb

## 2020-03-13 DIAGNOSIS — E669 Obesity, unspecified: Secondary | ICD-10-CM

## 2020-03-13 DIAGNOSIS — O99213 Obesity complicating pregnancy, third trimester: Secondary | ICD-10-CM

## 2020-03-13 DIAGNOSIS — O99353 Diseases of the nervous system complicating pregnancy, third trimester: Secondary | ICD-10-CM

## 2020-03-13 DIAGNOSIS — O3403 Maternal care for unspecified congenital malformation of uterus, third trimester: Secondary | ICD-10-CM

## 2020-03-13 DIAGNOSIS — O99210 Obesity complicating pregnancy, unspecified trimester: Secondary | ICD-10-CM

## 2020-03-13 DIAGNOSIS — Z3A30 30 weeks gestation of pregnancy: Secondary | ICD-10-CM

## 2020-03-13 DIAGNOSIS — O99333 Smoking (tobacco) complicating pregnancy, third trimester: Secondary | ICD-10-CM

## 2020-03-13 DIAGNOSIS — M549 Dorsalgia, unspecified: Secondary | ICD-10-CM

## 2020-03-13 DIAGNOSIS — Q51810 Arcuate uterus: Secondary | ICD-10-CM

## 2020-03-13 DIAGNOSIS — O99350 Diseases of the nervous system complicating pregnancy, unspecified trimester: Secondary | ICD-10-CM

## 2020-03-13 DIAGNOSIS — F17200 Nicotine dependence, unspecified, uncomplicated: Secondary | ICD-10-CM

## 2020-03-13 DIAGNOSIS — O0990 Supervision of high risk pregnancy, unspecified, unspecified trimester: Secondary | ICD-10-CM

## 2020-03-13 DIAGNOSIS — G40909 Epilepsy, unspecified, not intractable, without status epilepticus: Secondary | ICD-10-CM

## 2020-03-13 MED ORDER — COMFORT FIT MATERNITY SUPP SM MISC: 0 refills | Status: DC

## 2020-03-13 NOTE — Progress Notes (Signed)
Subjective:  Brittany Hendrix is a 29 y.o. [phone] at [redacted]w[redacted]d being seen today for ongoing prenatal care.  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  Patient reports heartburn.  Contractions: Irritability. Vag. Bleeding: None.  Movement: Present. Denies leaking of fluid.   The following portions of the patient's history were reviewed and updated as appropriate: allergies, current medications, past family history, past medical history, past social history, past surgical history and problem list. Problem list updated.  Objective:   Vitals:   03/13/20 0959  BP: 104/61  Pulse: 67  Weight: 206 lb (93.4 kg)    Fetal Status: Fetal Heart Rate (bpm): 145   Movement: Present     General:  Alert, oriented and cooperative. Patient is in no acute distress.  Skin: Skin is warm and dry. No rash noted.   Cardiovascular: Normal heart rate noted  Respiratory: Normal respiratory effort, no problems with respiration noted  Abdomen: Soft, gravid, appropriate for gestational age. Pain/Pressure: Absent     Pelvic:  Cervical exam deferred        Extremities: Normal range of motion.  Edema: None  Mental Status: Normal mood and affect. Normal behavior. Normal judgment and thought content.   Urinalysis:      Assessment and Plan:  Pregnancy: G7P1051 at [redacted]w[redacted]d  1. Supervision of high risk pregnancy, antepartum Rx: - Glucose Tolerance, 2 Hours w/1 Hour - CBC - HIV antibody (with reflex) - RPR  2. Arcuate uterus  3. Seizure disorder in pregnancy, antepartum (HCC) - clinically stable, on KEPPRA  4. Obesity during  pregnancy, antepartum  5. Tobacco dependence - cessation recommended   Preterm labor symptoms and general obstetric precautions including but not limited to vaginal bleeding, contractions, leaking of fluid and fetal movement were reviewed in detail with the patient. Please refer to After Visit Summary for other counseling recommendations.   Return in about 2 weeks (around 03/27/2020) for MyChart [facility]-Faculty Only.   Brock Bad, MD  03/13/20

## 2020-03-13 NOTE — Progress Notes (Signed)
ROB/GTT. TDAP declined. 

## 2020-03-14 LAB — CBC
Hematocrit: 31.3 % — ABNORMAL LOW (ref 34.0–46.6)
Hemoglobin: 10.7 g/dL — ABNORMAL LOW (ref 11.1–15.9)
MCH: 33 pg (ref 26.6–33.0)
MCHC: 34.2 g/dL (ref 31.5–35.7)
MCV: 97 fL (ref 79–97)
Platelets: 224 10*3/uL (ref 150–450)
RBC: 3.24 x10E6/uL — ABNORMAL LOW (ref 3.77–5.28)
RDW: 12.2 % (ref 11.7–15.4)
WBC: 7.8 10*3/uL (ref 3.4–10.8)

## 2020-03-14 LAB — HIV ANTIBODY (ROUTINE TESTING W REFLEX): HIV Screen 4th Generation wRfx: NONREACTIVE

## 2020-03-14 LAB — GLUCOSE TOLERANCE, 2 HOURS W/ 1HR
Glucose, 1 hour: 113 mg/dL (ref 65–179)
Glucose, 2 hour: 92 mg/dL (ref 65–152)
Glucose, Fasting: 75 mg/dL (ref 65–91)

## 2020-03-14 LAB — RPR: RPR Ser Ql: NONREACTIVE

## 2020-03-20 ENCOUNTER — Other Ambulatory Visit: Payer: Self-pay

## 2020-03-20 ENCOUNTER — Ambulatory Visit: Payer: Medicaid Other | Attending: Obstetrics

## 2020-03-20 ENCOUNTER — Ambulatory Visit: Payer: Medicaid Other | Admitting: *Deleted

## 2020-03-20 ENCOUNTER — Other Ambulatory Visit: Payer: Self-pay | Admitting: *Deleted

## 2020-03-20 DIAGNOSIS — O99353 Diseases of the nervous system complicating pregnancy, third trimester: Secondary | ICD-10-CM | POA: Diagnosis not present

## 2020-03-20 DIAGNOSIS — O403XX0 Polyhydramnios, third trimester, not applicable or unspecified: Secondary | ICD-10-CM

## 2020-03-20 DIAGNOSIS — Z362 Encounter for other antenatal screening follow-up: Secondary | ICD-10-CM

## 2020-03-20 DIAGNOSIS — G40909 Epilepsy, unspecified, not intractable, without status epilepticus: Secondary | ICD-10-CM | POA: Diagnosis present

## 2020-03-20 DIAGNOSIS — O34593 Maternal care for other abnormalities of gravid uterus, third trimester: Secondary | ICD-10-CM | POA: Diagnosis not present

## 2020-03-20 DIAGNOSIS — O99213 Obesity complicating pregnancy, third trimester: Secondary | ICD-10-CM

## 2020-03-20 DIAGNOSIS — Z6833 Body mass index (BMI) 33.0-33.9, adult: Secondary | ICD-10-CM

## 2020-03-20 DIAGNOSIS — O0990 Supervision of high risk pregnancy, unspecified, unspecified trimester: Secondary | ICD-10-CM | POA: Diagnosis present

## 2020-03-20 DIAGNOSIS — E669 Obesity, unspecified: Secondary | ICD-10-CM

## 2020-03-20 DIAGNOSIS — Z3A31 31 weeks gestation of pregnancy: Secondary | ICD-10-CM

## 2020-03-27 ENCOUNTER — Telehealth (INDEPENDENT_AMBULATORY_CARE_PROVIDER_SITE_OTHER): Payer: Medicaid Other | Admitting: Obstetrics and Gynecology

## 2020-03-27 ENCOUNTER — Encounter: Payer: Self-pay | Admitting: Obstetrics and Gynecology

## 2020-03-27 DIAGNOSIS — O409XX0 Polyhydramnios, unspecified trimester, not applicable or unspecified: Secondary | ICD-10-CM

## 2020-03-27 DIAGNOSIS — G40909 Epilepsy, unspecified, not intractable, without status epilepticus: Secondary | ICD-10-CM

## 2020-03-27 DIAGNOSIS — O0990 Supervision of high risk pregnancy, unspecified, unspecified trimester: Secondary | ICD-10-CM

## 2020-03-27 DIAGNOSIS — O99353 Diseases of the nervous system complicating pregnancy, third trimester: Secondary | ICD-10-CM

## 2020-03-27 DIAGNOSIS — O403XX0 Polyhydramnios, third trimester, not applicable or unspecified: Secondary | ICD-10-CM

## 2020-03-27 DIAGNOSIS — Z3A32 32 weeks gestation of pregnancy: Secondary | ICD-10-CM

## 2020-03-27 DIAGNOSIS — O99350 Diseases of the nervous system complicating pregnancy, unspecified trimester: Secondary | ICD-10-CM

## 2020-03-27 NOTE — Patient Instructions (Signed)
Third Trimester of Pregnancy The third trimester is from week 28 through week 40 (months 7 through 9). The third trimester is a time when the unborn baby (fetus) is growing rapidly. At the end of the ninth month, the fetus is about 20 inches in length and weighs 6-10 pounds. Body changes during your third trimester Your body will continue to go through many changes during pregnancy. The changes vary from woman to woman. During the third trimester:  Your weight will continue to increase. You can expect to gain 25-35 pounds (11-16 kg) by the end of the pregnancy.  You may begin to get stretch marks on your hips, abdomen, and breasts.  You may urinate more often because the fetus is moving lower into your pelvis and pressing on your bladder.  You may develop or continue to have heartburn. This is caused by increased hormones that slow down muscles in the digestive tract.  You may develop or continue to have constipation because increased hormones slow digestion and cause the muscles that push waste through your intestines to relax.  You may develop hemorrhoids. These are swollen veins (varicose veins) in the rectum that can itch or be painful.  You may develop swollen, bulging veins (varicose veins) in your legs.  You may have increased body aches in the pelvis, back, or thighs. This is due to weight gain and increased hormones that are relaxing your joints.  You may have changes in your hair. These can include thickening of your hair, rapid growth, and changes in texture. Some women also have hair loss during or after pregnancy, or hair that feels dry or thin. Your hair will most likely return to normal after your baby is born.  Your breasts will continue to grow and they will continue to become tender. A yellow fluid (colostrum) may leak from your breasts. This is the first milk you are producing for your baby.  Your belly button may stick out.  You may notice more swelling in your hands,  face, or ankles.  You may have increased tingling or numbness in your hands, arms, and legs. The skin on your belly may also feel numb.  You may feel short of breath because of your expanding uterus.  You may have more problems sleeping. This can be caused by the size of your belly, increased need to urinate, and an increase in your body's metabolism.  You may notice the fetus "dropping," or moving lower in your abdomen (lightening).  You may have increased vaginal discharge.  You may notice your joints feel loose and you may have pain around your pelvic bone. What to expect at prenatal visits You will have prenatal exams every 2 weeks until week 36. Then you will have weekly prenatal exams. During a routine prenatal visit:  You will be weighed to make sure you and the baby are growing normally.  Your blood pressure will be taken.  Your abdomen will be measured to track your baby's growth.  The fetal heartbeat will be listened to.  Any test results from the previous visit will be discussed.  You may have a cervical check near your due date to see if your cervix has softened or thinned (effaced).  You will be tested for Group B streptococcus. This happens between 35 and 37 weeks. Your health care provider may ask you:  What your birth plan is.  How you are feeling.  If you are feeling the baby move.  If you have had any abnormal   symptoms, such as leaking fluid, bleeding, severe headaches, or abdominal cramping.  If you are using any tobacco products, including cigarettes, chewing tobacco, and electronic cigarettes.  If you have any questions. Other tests or screenings that may be performed during your third trimester include:  Blood tests that check for low iron levels (anemia).  Fetal testing to check the health, activity level, and growth of the fetus. Testing is done if you have certain medical conditions or if there are problems during the pregnancy.  Nonstress test  (NST). This test checks the health of your baby to make sure there are no signs of problems, such as the baby not getting enough oxygen. During this test, a belt is placed around your belly. The baby is made to move, and its heart rate is monitored during movement. What is false labor? False labor is a condition in which you feel small, irregular tightenings of the muscles in the womb (contractions) that usually go away with rest, changing position, or drinking water. These are called Braxton Hicks contractions. Contractions may last for hours, days, or even weeks before true labor sets in. If contractions come at regular intervals, become more frequent, increase in intensity, or become painful, you should see your health care provider. What are the signs of labor?  Abdominal cramps.  Regular contractions that start at 10 minutes apart and become stronger and more frequent with time.  Contractions that start on the top of the uterus and spread down to the lower abdomen and back.  Increased pelvic pressure and dull back pain.  A watery or bloody mucus discharge that comes from the vagina.  Leaking of amniotic fluid. This is also known as your "water breaking." It could be a slow trickle or a gush. Let your health care provider know if it has a color or strange odor. If you have any of these signs, call your health care provider right away, even if it is before your due date. Follow these instructions at home: Medicines  Follow your health care provider's instructions regarding medicine use. Specific medicines may be either safe or unsafe to take during pregnancy.  Take a prenatal vitamin that contains at least 600 micrograms (mcg) of folic acid.  If you develop constipation, try taking a stool softener if your health care provider approves. Eating and drinking   Eat a balanced diet that includes fresh fruits and vegetables, whole grains, good sources of protein such as meat, eggs, or tofu,  and low-fat dairy. Your health care provider will help you determine the amount of weight gain that is right for you.  Avoid raw meat and uncooked cheese. These carry germs that can cause birth defects in the baby.  If you have low calcium intake from food, talk to your health care provider about whether you should take a daily calcium supplement.  Eat four or five small meals rather than three large meals a day.  Limit foods that are high in fat and processed sugars, such as fried and sweet foods.  To prevent constipation: ? Drink enough fluid to keep your urine clear or pale yellow. ? Eat foods that are high in fiber, such as fresh fruits and vegetables, whole grains, and beans. Activity  Exercise only as directed by your health care provider. Most women can continue their usual exercise routine during pregnancy. Try to exercise for 30 minutes at least 5 days a week. Stop exercising if you experience uterine contractions.  Avoid heavy lifting.  Do   not exercise in extreme heat or humidity, or at high altitudes.  Wear low-heel, comfortable shoes.  Practice good posture.  You may continue to have sex unless your health care provider tells you otherwise. Relieving pain and discomfort  Take frequent breaks and rest with your legs elevated if you have leg cramps or low back pain.  Take warm sitz baths to soothe any pain or discomfort caused by hemorrhoids. Use hemorrhoid cream if your health care provider approves.  Wear a good support bra to prevent discomfort from breast tenderness.  If you develop varicose veins: ? Wear support pantyhose or compression stockings as told by your healthcare provider. ? Elevate your feet for 15 minutes, 3-4 times a day. Prenatal care  Write down your questions. Take them to your prenatal visits.  Keep all your prenatal visits as told by your health care provider. This is important. Safety  Wear your seat belt at all times when driving.  Make  a list of emergency phone numbers, including numbers for family, friends, the hospital, and police and fire departments. General instructions  Avoid cat litter boxes and soil used by cats. These carry germs that can cause birth defects in the baby. If you have a cat, ask someone to clean the litter box for you.  Do not travel far distances unless it is absolutely necessary and only with the approval of your health care provider.  Do not use hot tubs, steam rooms, or saunas.  Do not drink alcohol.  Do not use any products that contain nicotine or tobacco, such as cigarettes and e-cigarettes. If you need help quitting, ask your health care provider.  Do not use any medicinal herbs or unprescribed drugs. These chemicals affect the formation and growth of the baby.  Do not douche or use tampons or scented sanitary pads.  Do not cross your legs for long periods of time.  To prepare for the arrival of your baby: ? Take prenatal classes to understand, practice, and ask questions about labor and delivery. ? Make a trial run to the hospital. ? Visit the hospital and tour the maternity area. ? Arrange for maternity or paternity leave through employers. ? Arrange for family and friends to take care of pets while you are in the hospital. ? Purchase a rear-facing car seat and make sure you know how to install it in your car. ? Pack your hospital bag. ? Prepare the baby's nursery. Make sure to remove all pillows and stuffed animals from the baby's crib to prevent suffocation.  Visit your dentist if you have not gone during your pregnancy. Use a soft toothbrush to brush your teeth and be gentle when you floss. Contact a health care provider if:  You are unsure if you are in labor or if your water has broken.  You become dizzy.  You have mild pelvic cramps, pelvic pressure, or nagging pain in your abdominal area.  You have lower back pain.  You have persistent nausea, vomiting, or  diarrhea.  You have an unusual or bad smelling vaginal discharge.  You have pain when you urinate. Get help right away if:  Your water breaks before 37 weeks.  You have regular contractions less than 5 minutes apart before 37 weeks.  You have a fever.  You are leaking fluid from your vagina.  You have spotting or bleeding from your vagina.  You have severe abdominal pain or cramping.  You have rapid weight loss or weight gain.  You have   shortness of breath with chest pain.  You notice sudden or extreme swelling of your face, hands, ankles, feet, or legs.  Your baby makes fewer than 10 movements in 2 hours.  You have severe headaches that do not go away when you take medicine.  You have vision changes. Summary  The third trimester is from week 28 through week 40, months 7 through 9. The third trimester is a time when the unborn baby (fetus) is growing rapidly.  During the third trimester, your discomfort may increase as you and your baby continue to gain weight. You may have abdominal, leg, and back pain, sleeping problems, and an increased need to urinate.  During the third trimester your breasts will keep growing and they will continue to become tender. A yellow fluid (colostrum) may leak from your breasts. This is the first milk you are producing for your baby.  False labor is a condition in which you feel small, irregular tightenings of the muscles in the womb (contractions) that eventually go away. These are called Braxton Hicks contractions. Contractions may last for hours, days, or even weeks before true labor sets in.  Signs of labor can include: abdominal cramps; regular contractions that start at 10 minutes apart and become stronger and more frequent with time; watery or bloody mucus discharge that comes from the vagina; increased pelvic pressure and dull back pain; and leaking of amniotic fluid. This information is not intended to replace advice given to you by your  health care provider. Make sure you discuss any questions you have with your health care provider. Document Revised: 11/01/2018 Document Reviewed: 08/16/2016 Elsevier Patient Education  2020 [employer] Inc[employer]  

## 2020-03-27 NOTE — Progress Notes (Signed)
OBSTETRICS PRENATAL VIRTUAL VISIT ENCOUNTER NOTE  Provider location: Center for [facility] [place]   I connected with [name] on 03/27/20 at 10:45 AM EDT by MyChart Video Encounter at home and verified that I am speaking with the correct person using two identifiers.   I discussed the limitations, risks, security and privacy concerns of performing an evaluation and management service virtually and the availability of in person appointments. I also discussed with the patient that there may be a patient responsible charge related to this service. The patient expressed understanding and agreed to proceed. Subjective:  Brittany Hendrix is a 29 y.o. [phone] at [redacted]w[redacted]d being seen today for ongoing prenatal care.  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  Patient reports no complaints.  Contractions: Irritability. Vag. Bleeding: None.  Movement: Present. Denies any leaking of fluid.   The following portions of the patient's history were reviewed and updated as appropriate: allergies, current medications, past family history, past medical history, past social history, past surgical history and problem list.   Objective:  There were no vitals filed for this visit.  Fetal Status:     Movement: Present     General:  Alert, oriented and cooperative. Patient is in no acute distress.  Respiratory: Normal respiratory effort, no problems with respiration noted  Mental Status: Normal mood and affect. Normal behavior. Normal judgment and thought content.  Rest of  physical exam deferred due to type of encounter  Imaging: [place] MFM OB FOLLOW UP  Result Date: 03/20/2020 ----------------------------------------------------------------------  OBSTETRICS REPORT                       (Signed Final 03/20/2020 12:15 pm) ---------------------------------------------------------------------- Patient Info  ID #:       [id number]                          D.O.B.:  1991/07/21 (28 yrs)  Name:       Brittany Hendrix               Visit Date: 03/20/2020 11:54 am ---------------------------------------------------------------------- Performed By  Attending:        Lin Landsman      Secondary Phy.:   [facility]                    MD  Performed By:     Eden Lathe BS      Address:          [address]                    [address]                                                             [address]  [address]                                                             [place]                                                             [address]  Referred By:      Rudean Curt             [facility]  Ref. Address:     Faculty ---------------------------------------------------------------------- Orders  #  Description                           Code        Ordered By  1  [place] MFM OB FOLLOW UP                   91478.29    Lin Landsman ----------------------------------------------------------------------  #  Order #                     Accession #                Episode #  1  [id number]                   [id number]                 [id number] ---------------------------------------------------------------------- Indications  Encounter for other  antenatal screening        Z36.2  follow-up  (low risk NIPS)  Polyhydramnios, third trimester, antepartum    O40.3XX0  condition or complication, unspecified fetus  Seizure disorder (Keppra)  O99.350 G40.909  Obesity complicating pregnancy, third          O99.213  trimester (pregravid BMI 33)  Uterine abnormality during pregnancy           O34.599  (arcuate)  [redacted] weeks gestation of pregnancy                Z3A.31 ---------------------------------------------------------------------- Fetal Evaluation  Num Of Fetuses:         1  Cardiac Activity:       Observed  Presentation:           Variable  Placenta:               Posterior  P. Cord Insertion:      Previously Visualized  Amniotic Fluid  AFI FV:      Polyhydramnios  AFI Sum(cm)     %Tile       Largest Pocket(cm)  29.58           > 97        8.67  RUQ(cm)       RLQ(cm)       LUQ(cm)        LLQ(cm)  8.67          5.19          7.53           8.19 ---------------------------------------------------------------------- Biometry  BPD:      78.1  mm     G. Age:  31w 2d         50  %    CI:        74.23   %    70 - 86                                                          FL/HC:      19.6   %    19.3 - 21.3  HC:      287.8  mm     G. Age:  31w 5d         31  %    HC/AC:      1.09        0.96 - 1.17  AC:      263.3  mm     G. Age:  30w 3d         30  %    FL/BPD:     72.3   %    71 - 87  FL:       56.5  mm     G. Age:  29w 5d          9  %    FL/AC:      21.5   %    20 - 24  HUM:      51.7  mm     G. Age:  30w 1d         35  %  Est. FW:    1565  gm      3 lb 7 oz     20  % ---------------------------------------------------------------------- OB History  Gravidity:    7         Term:   1        Prem:   0  SAB:   4  TOP:          1       Ectopic:  0        Living: 1 ---------------------------------------------------------------------- Gestational Age  LMP:           31w 0d        Date:  08/16/19                 EDD:   05/22/20  U/S Today:     30w  6d                                        EDD:   05/23/20  Best:          31w 0d     Det. By:  LMP  (08/16/19)          EDD:   05/22/20 ---------------------------------------------------------------------- Anatomy  Cranium:               Appears normal         Aortic Arch:            Appears normal  Cavum:                 Appears normal         Ductal Arch:            Previously seen  Ventricles:            Appears normal         Diaphragm:              Appears normal  Choroid Plexus:        Appears normal         Stomach:                Appears normal, left                                                                        sided  Cerebellum:            Appears normal         Abdomen:                Appears normal  Posterior Fossa:       Previously seen        Abdominal Wall:         Previously seen  Nuchal Fold:           Previously seen        Cord Vessels:           Appears normal (3                                                                        vessel cord)  Face:  Appears normal         Kidneys:                Appear normal                         (orbits and profile)  Lips:                  Appears normal         Bladder:                Appears normal  Thoracic:              Appears normal         Spine:                  Previously seen  Heart:                 Previously seen        Upper Extremities:      Previously seen  RVOT:                  Previously seen        Lower Extremities:      Previously seen  LVOT:                  Previously seen  Other:  Heels and left 5th digit previously visualized. 3VV and 3VTV          previously visualized. ---------------------------------------------------------------------- Cervix Uterus Adnexa  Cervix  Not visualized (advanced GA >24wks)  Uterus  No abnormality visualized.  Right Ovary  Not visualized.  Left Ovary  Not visualized.  Cul De Sac  No free fluid seen.  Adnexa  No abnormality visualized.  ---------------------------------------------------------------------- Impression  Follow up growth for seizure disorder with polyhydramnios  Normal interval growth with meaurements with consistent with  dates  There is good fetal movement and mild polyhydramnios today.  Counseling was not performed because Ms. Brinton left  prior to meeting with the provider. ---------------------------------------------------------------------- Recommendations  Follow up growth scheduled in 4 weeks. ----------------------------------------------------------------------               Lin Landsman, MD Electronically Signed Final Report   03/20/2020 12:15 pm ----------------------------------------------------------------------   Assessment and Plan:  Pregnancy: G1W2993 at [redacted]w[redacted]d 1. Supervision of high risk pregnancy, antepartum Stable  2. Seizure disorder in pregnancy, antepartum (HCC) Stable on Kappra Normal growth F/U growth 04/20/20  4. Polyhydramnios affecting pregnancy Mild F/U U/S as noted above  Preterm labor symptoms and general obstetric precautions including but not limited to vaginal bleeding, contractions, leaking of fluid and fetal movement were reviewed in detail with the patient. I discussed the assessment and treatment plan with the patient. The patient was provided an opportunity to ask questions and all were answered. The patient agreed with the plan and demonstrated an understanding of the instructions. The patient was advised to call back or seek an in-person office evaluation/go to [facility] at [facility] for any urgent or concerning symptoms. Please refer to After Visit Summary for other counseling recommendations.   I provided 8 minutes of face-to-face time during this encounter.  Return in about 2 weeks (around 04/10/2020) for face to face, MD only, OB visit.  Future Appointments  Date Time Provider Department Center  03/27/2020 10:45 AM Hermina Staggers, MD [facility] None   04/17/2020  2:30 PM [facility] NURSE [facility]  04/17/2020  2:30 PM [facility] US3 [facility]  04/29/2020  9:45 AM Christia Reading Lindalou Hose, NP [facility] None    Hermina Staggers, MD Center for [facility], [facility]

## 2020-03-27 NOTE — Progress Notes (Signed)
I connected with Brittany Hendrix on 03/27/20 at 10:45 AM EDT by telephone and verified that I am speaking with the correct person using two identifiers.  Pt unable to check BP she does not know how to use it. Pt will bring machine to next visit.  No concerns per pt.

## 2020-04-17 ENCOUNTER — Ambulatory Visit: Payer: Medicaid Other | Admitting: *Deleted

## 2020-04-17 ENCOUNTER — Ambulatory Visit: Payer: Medicaid Other | Attending: Obstetrics and Gynecology

## 2020-04-17 ENCOUNTER — Other Ambulatory Visit: Payer: Self-pay

## 2020-04-17 DIAGNOSIS — G40909 Epilepsy, unspecified, not intractable, without status epilepticus: Secondary | ICD-10-CM

## 2020-04-17 DIAGNOSIS — O0990 Supervision of high risk pregnancy, unspecified, unspecified trimester: Secondary | ICD-10-CM | POA: Insufficient documentation

## 2020-04-17 DIAGNOSIS — O99213 Obesity complicating pregnancy, third trimester: Secondary | ICD-10-CM

## 2020-04-17 DIAGNOSIS — O321XX0 Maternal care for breech presentation, not applicable or unspecified: Secondary | ICD-10-CM

## 2020-04-17 DIAGNOSIS — O99350 Diseases of the nervous system complicating pregnancy, unspecified trimester: Secondary | ICD-10-CM | POA: Diagnosis not present

## 2020-04-17 DIAGNOSIS — O403XX0 Polyhydramnios, third trimester, not applicable or unspecified: Secondary | ICD-10-CM | POA: Diagnosis not present

## 2020-04-17 DIAGNOSIS — O34593 Maternal care for other abnormalities of gravid uterus, third trimester: Secondary | ICD-10-CM | POA: Diagnosis not present

## 2020-04-17 DIAGNOSIS — Z3A35 35 weeks gestation of pregnancy: Secondary | ICD-10-CM

## 2020-04-17 DIAGNOSIS — Z362 Encounter for other antenatal screening follow-up: Secondary | ICD-10-CM

## 2020-04-20 ENCOUNTER — Telehealth: Payer: Self-pay | Admitting: Neurology

## 2020-04-20 NOTE — Telephone Encounter (Signed)
..   Pt understands that although there may be some limitations with this type of visit, we will take all precautions to reduce any security or privacy concerns.  Pt understands that this will be treated like an in office visit and we will file with pt's insurance, and there may be a patient responsible charge related to this service. ? ?

## 2020-04-21 ENCOUNTER — Encounter: Payer: Self-pay | Admitting: Obstetrics

## 2020-04-21 ENCOUNTER — Telehealth (INDEPENDENT_AMBULATORY_CARE_PROVIDER_SITE_OTHER): Payer: Medicaid Other | Admitting: Obstetrics

## 2020-04-21 DIAGNOSIS — Z3A35 35 weeks gestation of pregnancy: Secondary | ICD-10-CM

## 2020-04-21 DIAGNOSIS — O0990 Supervision of high risk pregnancy, unspecified, unspecified trimester: Secondary | ICD-10-CM

## 2020-04-21 DIAGNOSIS — O0993 Supervision of high risk pregnancy, unspecified, third trimester: Secondary | ICD-10-CM

## 2020-04-21 DIAGNOSIS — U071 COVID-19: Secondary | ICD-10-CM

## 2020-04-21 DIAGNOSIS — O99210 Obesity complicating pregnancy, unspecified trimester: Secondary | ICD-10-CM

## 2020-04-21 DIAGNOSIS — O99350 Diseases of the nervous system complicating pregnancy, unspecified trimester: Secondary | ICD-10-CM

## 2020-04-21 DIAGNOSIS — G40909 Epilepsy, unspecified, not intractable, without status epilepticus: Secondary | ICD-10-CM

## 2020-04-21 DIAGNOSIS — E669 Obesity, unspecified: Secondary | ICD-10-CM

## 2020-04-21 DIAGNOSIS — O99213 Obesity complicating pregnancy, third trimester: Secondary | ICD-10-CM

## 2020-04-21 NOTE — Progress Notes (Signed)
OBSTETRICS PRENATAL VIRTUAL VISIT ENCOUNTER NOTE  Provider location: Center for [facility]   I connected with Brittany Hendrix on 04/21/20 at  3:45 PM EDT by MyChart Video Encounter at home and verified that I am speaking with the correct person using two identifiers.   I discussed the limitations, risks, security and privacy concerns of performing an evaluation and management service virtually and the availability of in person appointments. I also discussed with the patient that there may be a patient responsible charge related to this service. The patient expressed understanding and agreed to proceed. Subjective:  Brittany Hendrix is a 29 y.o. [phone] at [redacted]w[redacted]d being seen today for ongoing prenatal care.  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  Patient reports no complaints.  Contractions: Not present. Vag. Bleeding: None.  Movement: Present. Denies any leaking of fluid.   The following portions of the patient's history were reviewed and updated as appropriate: allergies, current medications, past family history, past medical history, past social history, past surgical history and problem list.   Objective:  There were no vitals filed for this visit.  Fetal Status:     Movement: Present     General:  Alert, oriented and cooperative. Patient is in no acute distress.  Respiratory: Normal respiratory effort, no problems with respiration noted  Mental Status: Normal mood and affect. Normal behavior. Normal judgment and thought content.  Rest of  physical exam deferred due to type of encounter  Imaging: [place] MFM OB FOLLOW UP  Result Date: 04/17/2020 ----------------------------------------------------------------------  OBSTETRICS REPORT                       (Signed Final 04/17/2020 03:54 pm) ---------------------------------------------------------------------- Patient Info  ID #:       [id number]                          D.O.B.:  03/10/1991 (28 yrs)  Name:       Brittany Hendrix               Visit Date: 04/17/2020 02:46 pm ---------------------------------------------------------------------- Performed By  Attending:        Noralee Space MD        Secondary Phy.:   [facility]  Performed By:     Sandi Mealy        Address:          [address]  [address]                                                             [place]                                                             [address]  Referred By:      Rudean Curt             [facility]         [facility]                               [facility]  Ref. Address:     Faculty ---------------------------------------------------------------------- Orders  #  Description                           Code        Ordered By  1  [place] MFM OB FOLLOW UP                   60454.09    Lin Landsman ----------------------------------------------------------------------  #  Order #                     Accession #                Episode #  1  [id number]                   [id number]                 [id number] ---------------------------------------------------------------------- Indications  Polyhydramnios, third trimester, antepartum     O40.3XX0  condition or complication, unspecified fetus  Seizure disorder (Keppra)  O99.350 G40.909  Obesity complicating pregnancy, third          O99.213  trimester (pregravid BMI 33)  Uterine abnormality during pregnancy           O34.599  (arcuate)  Encounter for other antenatal screening        Z36.2  follow-up  (low risk NIPS)  [redacted] weeks gestation of pregnancy                Z3A.35 ---------------------------------------------------------------------- Fetal Evaluation  Num Of Fetuses:         1  Fetal Heart Rate(bpm):  136  Cardiac Activity:       Observed  Presentation:           Breech  Placenta:               Posterior  P. Cord Insertion:      Visualized  Amniotic Fluid  AFI FV:      Within normal limits  AFI Sum(cm)     %Tile       Largest Pocket(cm)  18.11           67          7.12  RUQ(cm)       RLQ(cm)       LUQ(cm)        LLQ(cm)  7.12          2.68          2.92           5.39 ---------------------------------------------------------------------- Biometry  BPD:      84.2  mm     G. Age:  33w 6d         22  %    CI:        74.85   %    70 - 86                                                          FL/HC:      20.4   %    20.1 - 22.3  HC:      308.8  mm     G. Age:  34w 3d          9  %    HC/AC:      0.98        0.93 - 1.11  AC:      314.6  mm     G. Age:  35w 3d         68  %    FL/BPD:     74.9   %    71 - 87  FL:       63.1  mm     G. Age:  32w 4d        3.1  %    FL/AC:      20.1   %    20 - 24  HUM:      55.3  mm     G. Age:  32w 1d          8  %  LV:        3.2  mm  Est. FW:    2423  gm      5 lb 5 oz  30  % ---------------------------------------------------------------------- OB History  Gravidity:    7         Term:   1        Prem:   0        SAB:   4  TOP:          1       Ectopic:  0        Living: 1 ---------------------------------------------------------------------- Gestational Age  LMP:           35w 0d        Date:  08/16/19                 EDD:   05/22/20  U/S  Today:     34w 1d                                        EDD:   05/28/20  Best:          35w 0d     Det. By:  LMP  (08/16/19)          EDD:   05/22/20 ---------------------------------------------------------------------- Anatomy  Cranium:               Appears normal         Aortic Arch:            Previously seen  Cavum:                 Appears normal         Ductal Arch:            Previously seen  Ventricles:            Appears normal         Diaphragm:              Appears normal  Choroid Plexus:        Previously seen        Stomach:                Appears normal, left                                                                        sided  Cerebellum:            Previously seen        Abdomen:                Appears normal  Posterior Fossa:       Previously seen        Abdominal Wall:         Previously seen  Nuchal Fold:           Not applicable (>20    Cord Vessels:           Previously seen                         wks GA)  Face:                  Orbits and profile  Kidneys:                Appear normal                         previously seen  Lips:                  Previously seen        Bladder:                Appears normal  Thoracic:              Appears normal         Spine:                  Previously seen  Heart:                 Previously seen        Upper Extremities:      Previously seen  RVOT:                  Previously seen        Lower Extremities:      Previously seen  LVOT:                  Previously seen  Other:  Heels and left 5th digit previously visualized. 3VV and 3VTV          previously visualized. ---------------------------------------------------------------------- Cervix Uterus Adnexa  Cervix  Length:           3.22  cm.  Normal appearance by transabdominal scan. ---------------------------------------------------------------------- Impression  Patient returned for fetal growth assessment.  She has  epilepsy and takes Keppra.  She reports her last episode was  more than a  year ago.  She does not have gestational  diabetes.  Polyhydramnios was seen on previous ultrasound.  Amniotic fluid is normal and good fetal activity is seen .Fetal  growth is appropriate for gestational age .  We reassured the patient of the findings. ---------------------------------------------------------------------- Recommendations  Follow-up scans as clinically indicated. ----------------------------------------------------------------------                  Noralee Space, MD Electronically Signed Final Report   04/17/2020 03:54 pm ----------------------------------------------------------------------   Assessment and Plan:  Pregnancy: [id number] at [redacted]w[redacted]d 1. Supervision of high risk pregnancy, antepartum  2. Seizure disorder in pregnancy, antepartum (HCC) - clinically stable  3. Obesity during pregnancy, antepartum   Preterm labor symptoms and general obstetric precautions including but not limited to vaginal bleeding, contractions, leaking of fluid and fetal movement were reviewed in detail with the patient. I discussed the assessment and treatment plan with the patient. The patient was provided an opportunity to ask questions and all were answered. The patient agreed with the plan and demonstrated an understanding of the instructions. The patient was advised to call back or seek an in-person office evaluation/go to [facility]U at [facility] for any urgent or concerning symptoms. Please refer to After Visit Summary for other counseling recommendations.   I provided 10 minutes of face-to-face time during this encounter.  Return in about 2 weeks (around 05/05/2020) for ROB.  Future Appointments  Date Time Provider Department Center  04/21/2020  3:45 PM Brock Bad, MD [facility] None  04/29/2020  9:45 AM Glean Salvo, NP [facility] None    Coral Ceo, MD Center for [facility], [facility] 04/21/20

## 2020-04-21 NOTE — Progress Notes (Signed)
Pt is unable to take BP for today's visit.

## 2020-04-29 ENCOUNTER — Encounter: Payer: Self-pay | Admitting: Neurology

## 2020-04-29 ENCOUNTER — Telehealth (INDEPENDENT_AMBULATORY_CARE_PROVIDER_SITE_OTHER): Payer: Medicaid Other | Admitting: Neurology

## 2020-04-29 DIAGNOSIS — G40909 Epilepsy, unspecified, not intractable, without status epilepticus: Secondary | ICD-10-CM | POA: Diagnosis not present

## 2020-04-29 NOTE — Progress Notes (Signed)
Virtual Visit via Video Note  I connected with Brittany Hendrix on 04/29/20 at  9:45 AM EDT by a video enabled telemedicine application and verified that I am speaking with the correct person using two identifiers.  Location: Patient: in her home Provider: in the office    I discussed the limitations of evaluation and management by telemedicine and the availability of in person appointments. The patient expressed understanding and agreed to proceed.  History of Present Illness: Brittany B Martinezis a 29 years old female, with epilepsy disorder, currently taking Keppra 500 in the morning, 1000 mg in the evening. Last seizure was in May 2014.   She had seizures since she was 59-58 years old. All seizure has similar seminology. She has an aura before those seizures, she feels drowsy and sometimes nauseated, lasting few minutes, followed by loss of conciousness, whole body shaking, then post ictal confusion usually last 20-30 minutes..   She has injured herself in the face and on her leg during seizures in the past. She was not treated with anti-epilepsicmedications, until after she had a recurrent seizure in May 2012, she was put on Levetiracetam 500 mg b.i.d.   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 her bloodwork tested positive for marijuana.   For a while, she was noncompliant with her medications, also concerned about the medication costs, she continue have recurrent seizures.   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 EEG was normal  UPDATE June 5th 2015: Last visit was in May 2014, she has two recurrent seizure 2 weeks ago, precede by dizziness, fainting sensation, then body jerking movement with LOC, she has two seizures in one day, while taking keppra 500mg  bid,  compliance with her medication  UPDATE July 28th 2016: She is now taking Keppra 500 mg in the morning, 1000 mg at evening, no recurrent seizures, she work as a 09-14-2004, pay medicine out of her pocket, often times, it is a struggle  UPDATE April 27th 2017: She had one recurrent seizure at Feb 23rd 2017 when she was [redacted] week pregnant, precede by intermittent body jumping, she has been compliant with her Keppra 500 mg in the morning, 1000 mg at night, she is pregnant with her first son, expected due date is June first 2017, getting keppra filled at 02-11-1983.  I reviewed laboratory, normal CMP, mild anemia 10.8,  UPDATE Sept 24 2018: Most recent seizure was on Sept 10 2018, she has aura of body jumpy, body nervous shaky, she gasp for air, LOC, she bite her lips followed by headache, body achy pain  UPDATE Nov 25 2019: She still works as a [birth year]1971 at [employer], currently pregnant with a boy, she had a healthy delivery in 2017 while taking Keppra 1500 mg daily, reported last seizure was May 24, 2020 following her hollowing party, she was sleep deprived, also had excessive alcohol intake, she has been compliant with her medications  Laboratory evaluation in 2021,  A1c was 5.0, negative hepatitis C, HIV, normal CBC, rheumatoid factor,  Update April 29, 2020 SS: In May 2021, Keppra level was normal 26.9, TSH low end normal.  During virtual visit today, tested positive for Covid 9/25, is [redacted] weeks pregnant, Covid symptoms are mild, so far pregnancy going well.  No recurrent seizure.  Remains  on Keppra 1000 mg twice a day.  EEG ordered at last visit, missed appointment.   Observations/Objective: Via virtual visit, is alert and oriented, speech is clear and concise, facial symmetry noted, follows commands  Assessment and Plan: 1.  Epilepsy -Is [redacted] weeks pregnant -In May, Keppra level was 26.9, will repeat level (Covid +, come next week when out of quarantine) -Continue Keppra 1000 mg  twice a day, continue prenatal vitamin  -Make sure you are staying well-hydrated, compliant with medicine -Call for recurrent seizure, follow-up 3 months from now will be postpartum.  Follow Up Instructions: 3 months 08/11/2020 2:15   I discussed the assessment and treatment plan with the patient. The patient was provided an opportunity to ask questions and all were answered. The patient agreed with the plan and demonstrated an understanding of the instructions.   The patient was advised to call back or seek an in-person evaluation if the symptoms worsen or if the condition fails to improve as anticipated.  I spent 20 minutes of face-to-face and non-face-to-face time with patient.  This included previsit chart review, lab review, study review, order entry, electronic health record documentation, patient education.   Otila Kluver, DNP  [facility] [address], [address] [place], [place] [address] [phone]

## 2020-05-05 ENCOUNTER — Ambulatory Visit (INDEPENDENT_AMBULATORY_CARE_PROVIDER_SITE_OTHER): Payer: Medicaid Other | Admitting: Women's Health

## 2020-05-05 ENCOUNTER — Other Ambulatory Visit: Payer: Self-pay

## 2020-05-05 ENCOUNTER — Other Ambulatory Visit (HOSPITAL_COMMUNITY)
Admission: RE | Admit: 2020-05-05 | Discharge: 2020-05-05 | Disposition: A | Discharge status: Home / Self Care | Payer: Medicaid Other | Source: Ambulatory Visit | Attending: Women's Health | Admitting: Women's Health

## 2020-05-05 ENCOUNTER — Encounter: Payer: Self-pay | Admitting: Women's Health

## 2020-05-05 VITALS — BP 102/59 | HR 74 | Wt 204.0 lb

## 2020-05-05 DIAGNOSIS — O409XX0 Polyhydramnios, unspecified trimester, not applicable or unspecified: Secondary | ICD-10-CM

## 2020-05-05 DIAGNOSIS — G40909 Epilepsy, unspecified, not intractable, without status epilepticus: Secondary | ICD-10-CM

## 2020-05-05 DIAGNOSIS — O0990 Supervision of high risk pregnancy, unspecified, unspecified trimester: Secondary | ICD-10-CM

## 2020-05-05 DIAGNOSIS — Q51810 Arcuate uterus: Secondary | ICD-10-CM

## 2020-05-05 DIAGNOSIS — O99350 Diseases of the nervous system complicating pregnancy, unspecified trimester: Secondary | ICD-10-CM

## 2020-05-05 DIAGNOSIS — O99210 Obesity complicating pregnancy, unspecified trimester: Secondary | ICD-10-CM

## 2020-05-05 NOTE — Progress Notes (Signed)
Patient presents for ROB. Patient would like cervix checked. She has no other concerns. Patient states that she tested positive for COVID on 04/18/20.

## 2020-05-05 NOTE — Patient Instructions (Signed)
Maternity Assessment Unit (MAU)  The Maternity Assessment Unit (MAU) is located at the [facility]. The address is: [address] [place], [place], [place] [address]. Please see map below for additional directions.    The Maternity Assessment Unit is designed to help you during your pregnancy, and for up to 6 weeks after delivery, with any pregnancy- or postpartum-related emergencies, if you think you are in labor, or if your water has broken. For example, if you experience nausea and vomiting, vaginal bleeding, severe abdominal or pelvic pain, elevated blood pressure or other problems related to your pregnancy or postpartum time, please come to the Maternity Assessment Unit for assistance.       AREA PEDIATRIC/FAMILY PRACTICE PHYSICIANS  [facility] [address] [place], [place] [address] Phone - [phone]   Fax - [place] [address] [place], [place]  [address] Phone - [phone]   Fax - [phone]  [place] [address] [place] [address] [place], [place]  [address] Phone - [phone]   Fax - [phone]  [facility] [address] [place], [place]  [address] Phone - [phone]   Fax - [phone]  [place] [address], [place], [place]  [address] Phone - [phone]   Fax - [place] [address] [place], [place]  [address] Phone - [phone]   Fax - [place] [facility] [place] [address], [place], [place]  [address] Phone - [phone]   Fax - [phone]  [place] [address] [place], [place], [place]  [address] Phone - [phone]   Fax - [place] [address] [place], [place]  [address] Phone - [phone]   Fax -  [phone] [facility] [place]. [address] [place], [place]  [address] Phone - [phone]   Fax - [phone]  [place] [address] [place], [place]  [address] Phone - [phone]   Fax - [phone]  [facility] [place], [place], [place]  [address] Phone - [phone]   Fax - [place] [address], [place], [place]  [address] Phone - [phone]   Fax - [phone]  [facility] [address], [place], [place]  [address] Phone - [place], [place]  [address] Phone - [phone]   Fax - [place] [address], [place], [place]  [address] Phone - [phone]   Fax - [phone]  [place] [address] [place], [place]  [address] Phone - [phone]   Fax - [place]. [place], [place]  [address] Phone - [phone]   Fax - [place], [place], [place]  [address] Phone - [phone]   Fax - [place] [address], [address], [place]  [address] Phone - [phone]   Fax - [phone]  [place] [address] [place], [place]  [address] Phone - [phone]   Fax - [place] [address] [place],   [address] Phone - [phone]  Fax - [phone]  [facility] [address] [place], [place]  [address] Phone - [phone]   Fax - [phone] [place] [phone] [place], [place]  [address] Phone - [phone]   Fax - [phone]  [place] [address] [place], [place]  [address] Phone - [phone]   Fax - [phone]  [facility] - [place] [address] [place], [place]  [address] Phone - [phone]   Fax - [phone]          Signs and Symptoms of Labor Labor is your body's natural process of moving your baby, placenta, and umbilical cord out of your uterus. The process of labor usually starts when your baby is full-term, between 70 and 40 weeks of pregnancy. How will I know when I am close to going into labor? As your body prepares for labor and the birth of your baby, you may notice the following symptoms in the weeks and days before true labor starts:  Having a strong desire to get your home ready to receive your new baby. This is called nesting. Nesting may be a sign that labor is approaching, and it may occur several weeks before birth. Nesting may involve cleaning and organizing your home.  Passing a small amount of thick, bloody mucus out of your vagina (normal bloody show or losing your mucus plug). This may happen more than a week before labor begins, or it might occur right before labor begins as the opening of the cervix starts to widen (dilate). For some women, the entire mucus plug passes at once. For others, smaller portions of the mucus plug may gradually pass over several days.  Your baby moving (dropping) lower in your pelvis to get into position for birth (lightening). When this happens, you may feel more pressure on your bladder and pelvic bone and less pressure on your ribs. This may make it easier to breathe. It may also cause you to need to urinate more often and have problems with bowel movements.  Having "practice contractions" (Braxton Hicks contractions) that occur at irregular (unevenly spaced) intervals that are more than 10 minutes apart. This is also called false labor. False labor contractions are common after exercise or sexual activity, and they will stop if you change position, rest, or drink fluids. These contractions are usually mild and  do not get stronger over time. They may feel like: ? A backache or back pain. ? Mild cramps, similar to menstrual cramps. ? Tightening or pressure in your abdomen. Other early symptoms that labor may be starting soon include:  Nausea or loss of appetite.  Diarrhea.  Having a sudden burst of energy, or feeling very tired.  Mood changes.  Having trouble sleeping. How will I know when labor has begun? Signs that true labor has begun may include:  Having contractions that come at regular (evenly spaced) intervals and increase in intensity. This may feel like more intense tightening or pressure in your abdomen that moves to your back. ? Contractions may also feel like rhythmic pain in your upper thighs or back that comes and goes at regular intervals. ? For first-time mothers, this change in intensity of contractions often occurs at a more gradual pace. ? Women who have given birth before may notice a more rapid progression of contraction changes.  Having a feeling of pressure in the vaginal area.  Your water breaking (rupture of membranes). This is when the  sac of fluid that surrounds your baby breaks. When this happens, you will notice fluid leaking from your vagina. This may be clear or blood-tinged. Labor usually starts within 24 hours of your water breaking, but it may take longer to begin. ? Some women notice this as a gush of fluid. ? Others notice that their underwear repeatedly becomes damp. Follow these instructions at home:   When labor starts, or if your water breaks, call your health care provider or nurse care line. Based on your situation, they will determine when you should go in for an exam.  When you are in early labor, you may be able to rest and manage symptoms at home. Some strategies to try at home include: ? Breathing and relaxation techniques. ? Taking a warm bath or shower. ? Listening to music. ? Using a heating pad on the lower back for pain. If you are  directed to use heat:  Place a towel between your skin and the heat source.  Leave the heat on for 20-30 minutes.  Remove the heat if your skin turns bright red. This is especially important if you are unable to feel pain, heat, or cold. You may have a greater risk of getting burned. Get help right away if:  You have painful, regular contractions that are 5 minutes apart or less.  Labor starts before you are [redacted] weeks along in your pregnancy.  You have a fever.  You have a headache that does not go away.  You have bright red blood coming from your vagina.  You do not feel your baby moving.  You have a sudden onset of: ? Severe headache with vision problems. ? Nausea, vomiting, or diarrhea. ? Chest pain or shortness of breath. These symptoms may be an emergency. If your health care provider recommends that you go to the hospital or birth center where you plan to deliver, do not drive yourself. Have someone else drive you, or call emergency services (911 in the [place] Summary  Labor is your body's natural process of moving your baby, placenta, and umbilical cord out of your uterus.  The process of labor usually starts when your baby is full-term, between 78 and 40 weeks of pregnancy.  When labor starts, or if your water breaks, call your health care provider or nurse care line. Based on your situation, they will determine when you should go in for an exam. This information is not intended to replace advice given to you by your health care provider. Make sure you discuss any questions you have with your health care provider. Document Revised: 04/10/2017 Document Reviewed: 12/16/2016 Elsevier Patient Education  2020 [employer].

## 2020-05-05 NOTE — Progress Notes (Signed)
Subjective:  Brittany Hendrix is a 29 y.o. [phone] at [redacted]w[redacted]d being seen today for ongoing prenatal care.  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  Patient reports no complaints.  Contractions: Irregular. Vag. Bleeding: None.  Movement: Present. Denies leaking of fluid.   The following portions of the patient's history were reviewed and updated as appropriate: allergies, current medications, past family history, past medical history, past social history, past surgical history and problem list. Problem list updated.  Objective:   Vitals:   05/05/20 1103  BP: (!) 102/59  Pulse: 74  Weight: 204 lb (92.5 kg)    Fetal Status: Fetal Heart Rate (bpm): 135 Fundal Height: 37 cm Movement: Present     General:  Alert, oriented and cooperative. Patient is in no acute distress.  Skin: Skin is warm and dry. No rash noted.   Cardiovascular: Normal heart rate noted  Respiratory: Normal respiratory effort, no problems with respiration noted  Abdomen: Soft, gravid, appropriate for gestational age. Pain/Pressure: Present     Pelvic: Vag. Bleeding: None     Cervical exam performed Dilation: 1 Effacement (%): Thick Station: Ballotable  Extremities: Normal range of motion.  Edema: None  Mental Status: Normal mood and affect. Normal behavior. Normal judgment and thought content.   Urinalysis:      Assessment and Plan:  Pregnancy: [id number] at [redacted]w[redacted]d  1. Supervision of high risk pregnancy, antepartum - peds list given - GBS/cultures  2. Polyhydramnios affecting pregnancy -AFI 29.5  on 03/20/2020 -AFI 18.11 on 04/17/2020  3. Obesity during pregnancy, antepartum -on low dose ASA  4. Arcuate uterus -normal variant  5. Seizure disorder in pregnancy, antepartum (HCC) -on Keppra, followed by [facility] neurology  Term labor symptoms and general obstetric precautions including but not limited to vaginal bleeding, contractions, leaking of fluid and fetal movement were reviewed in detail with the patient. I discussed the assessment and treatment plan with the patient. The patient was provided an opportunity to ask questions and all were answered. The patient agreed with the plan and demonstrated an understanding of the instructions. The patient was advised to call back or seek an in-person office evaluation/go to [facility] at [facility] for any urgent or concerning symptoms. Please refer to After Visit Summary for other counseling recommendations.  Return in about 1 week (around 05/12/2020) for in-person HOB/APP OK.   Eppie Barhorst, Odie Sera, NP

## 2020-05-06 LAB — CERVICOVAGINAL ANCILLARY ONLY
Chlamydia: NEGATIVE
Comment: NEGATIVE
Comment: NEGATIVE
Comment: NORMAL
Neisseria Gonorrhea: NEGATIVE
Trichomonas: NEGATIVE

## 2020-05-07 LAB — STREP GP B NAA: Strep Gp B NAA: NEGATIVE

## 2020-05-12 ENCOUNTER — Encounter: Payer: Self-pay | Admitting: Obstetrics and Gynecology

## 2020-05-12 ENCOUNTER — Other Ambulatory Visit: Payer: Self-pay

## 2020-05-12 ENCOUNTER — Ambulatory Visit (INDEPENDENT_AMBULATORY_CARE_PROVIDER_SITE_OTHER): Payer: Medicaid Other | Admitting: Obstetrics and Gynecology

## 2020-05-12 VITALS — BP 92/60 | HR 83 | Wt 204.0 lb

## 2020-05-12 DIAGNOSIS — O0990 Supervision of high risk pregnancy, unspecified, unspecified trimester: Secondary | ICD-10-CM

## 2020-05-12 DIAGNOSIS — O409XX0 Polyhydramnios, unspecified trimester, not applicable or unspecified: Secondary | ICD-10-CM

## 2020-05-12 DIAGNOSIS — G40909 Epilepsy, unspecified, not intractable, without status epilepticus: Secondary | ICD-10-CM

## 2020-05-12 DIAGNOSIS — O99350 Diseases of the nervous system complicating pregnancy, unspecified trimester: Secondary | ICD-10-CM

## 2020-05-12 DIAGNOSIS — O99210 Obesity complicating pregnancy, unspecified trimester: Secondary | ICD-10-CM

## 2020-05-12 NOTE — Progress Notes (Signed)
   PRENATAL VISIT NOTE  Subjective:  Brittany Hendrix is a 29 y.o. [phone] at [redacted]w[redacted]d being seen today for ongoing prenatal care.  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  Patient reports no complaints.  Contractions: Irritability. Vag. Bleeding: None.  Movement: Present. Denies leaking of fluid.   The following portions of the patient's history were reviewed and updated as appropriate: allergies, current medications, past family history, past medical history, past social history, past surgical history and problem list.   Objective:   Vitals:   05/12/20 1442  BP: 92/60  Pulse: 83  Weight: 204 lb (92.5 kg)    Fetal Status: Fetal Heart Rate (bpm): 150   Movement: Present     General:  Alert, oriented and cooperative. Patient is in no acute distress.  Skin: Skin is warm and dry. No rash noted.   Cardiovascular: Normal heart rate noted  Respiratory: Normal respiratory effort, no problems with respiration noted  Abdomen: Soft, gravid, appropriate for gestational age.  Pain/Pressure: Present     Pelvic: Cervical exam performed in the presence of a chaperone Dilation: 1 Effacement (%): Thick Station: Ballotable  Extremities: Normal range of motion.  Edema: None  Mental Status: Normal mood and affect. Normal behavior. Normal judgment and thought content.   Assessment and Plan:  Pregnancy: [id number] at [redacted]w[redacted]d 1. Supervision of high risk pregnancy, antepartum Patient is doing well without complaints  2. Obesity during pregnancy, antepartum   3. Polyhydramnios affecting  pregnancy Resolved  4. Seizure disorder in pregnancy, antepartum (HCC) Stable on Keppra No recent seizure activity  Term labor symptoms and general obstetric precautions including but not limited to vaginal bleeding, contractions, leaking of fluid and fetal movement were reviewed in detail with the patient. Please refer to After Visit Summary for other counseling recommendations.   Return in about 1 week (around 05/19/2020) for in person, ROB, High risk.  Future Appointments  Date Time Provider Department Center  05/19/2020  1:15 PM Johnny Bridge, MD [facility] None  08/11/2020  2:15 PM Glean Salvo, NP [facility] None    Catalina Antigua, MD

## 2020-05-19 ENCOUNTER — Encounter: Payer: Self-pay | Admitting: Obstetrics and Gynecology

## 2020-05-19 ENCOUNTER — Ambulatory Visit (INDEPENDENT_AMBULATORY_CARE_PROVIDER_SITE_OTHER): Payer: Medicaid Other

## 2020-05-19 ENCOUNTER — Other Ambulatory Visit: Payer: Self-pay

## 2020-05-19 ENCOUNTER — Ambulatory Visit (INDEPENDENT_AMBULATORY_CARE_PROVIDER_SITE_OTHER): Payer: Medicaid Other | Admitting: Obstetrics and Gynecology

## 2020-05-19 VITALS — BP 99/61 | HR 77 | Wt 205.3 lb

## 2020-05-19 DIAGNOSIS — Z3689 Encounter for other specified antenatal screening: Secondary | ICD-10-CM | POA: Diagnosis not present

## 2020-05-19 DIAGNOSIS — G40909 Epilepsy, unspecified, not intractable, without status epilepticus: Secondary | ICD-10-CM

## 2020-05-19 DIAGNOSIS — O321XX0 Maternal care for breech presentation, not applicable or unspecified: Secondary | ICD-10-CM

## 2020-05-19 DIAGNOSIS — O0990 Supervision of high risk pregnancy, unspecified, unspecified trimester: Secondary | ICD-10-CM | POA: Diagnosis not present

## 2020-05-19 DIAGNOSIS — O99350 Diseases of the nervous system complicating pregnancy, unspecified trimester: Secondary | ICD-10-CM

## 2020-05-19 LAB — POCT FERNING: Ferning, POC: NEGATIVE

## 2020-05-19 NOTE — Progress Notes (Signed)
   PRENATAL VISIT NOTE  Subjective:  Brittany Hendrix is a 29 y.o. [phone] at [redacted]w[redacted]d being seen today for ongoing prenatal care.  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  Patient reports leaking of a small amount of fluids a few days ago. .  Contractions: Irritability. Vag. Bleeding: None.  Movement: Present. Denies leaking of fluid.   The following portions of the patient's history were reviewed and updated as appropriate: allergies, current medications, past family history, past medical history, past social history, past surgical history and problem list.   Objective:   Vitals:   05/19/20 1311  BP: 99/61  Pulse: 77  Weight: 205 lb 4.8 oz (93.1 kg)    Fetal Status: Fetal Heart Rate (bpm): 146 Fundal Height: 39 cm Movement: Present  Presentation: Homero Fellers Breech  General:  Alert, oriented and cooperative. Patient is in no acute distress.  Skin: Skin is warm and dry. No rash noted.   Cardiovascular: Normal heart rate noted  Respiratory: Normal respiratory effort, no problems with respiration noted  Abdomen: Soft, gravid, appropriate for gestational age.  Pain/Pressure: Present     Pelvic: Cervical exam performed in the presence of a chaperone Dilation: 1 Effacement (%): 0 Station: -3  No pooling of fluids.  Nitrazine negative.  Fern negative.  Extremities: Normal range of motion.  Edema: None  Mental Status: Normal mood and affect. Normal behavior. Normal judgment and thought content.   Assessment and Plan:  Pregnancy: [id number] at [redacted]w[redacted]d 1. Supervision of high risk  pregnancy, antepartum  - POCT Ferning  2. Determine fetal presentation using ultrasound  - US OB Limited; Future  3. Seizure disorder in pregnancy, antepartum (HCC) - Cont Keppra.  4. Breech presentation, single or unspecified fetus - Risks and benefits of version vs primary c/section discussed.  Patient desires to proceed with an attempt at version with the understanding that it may be unsuccessful and a c/section will then be recommened.  She voiced understanding.  Patient is scheduled for tomorrow.    Term labor symptoms and general obstetric precautions including but not limited to vaginal bleeding, contractions, leaking of fluid and fetal movement were reviewed in detail with the patient. Please refer to After Visit Summary for other counseling recommendations.   No follow-ups on file.  Future Appointments  Date Time Provider Department Center  05/20/2020  8:00 AM [facility] SCHED [facility] None  08/11/2020  2:15 PM Glean Salvo, NP [facility] None    Johnny Bridge, MD

## 2020-05-19 NOTE — Progress Notes (Signed)
Pt presents for ROB c/o possible LOF x 2 days and increased pelvic pain/pressure.

## 2020-05-19 NOTE — Patient Instructions (Signed)
Breech Birth A breech birth is when a baby is born with the buttocks or feet first. Most babies are in a head down (vertex) position when they are born. There are three types of breech babies:  When the baby's buttocks are showing first in the vagina (birth canal) with the legs bent at the knees and the feet down near the buttocks (complete breech).  When the baby's buttocks are showing first in the birth canal with the legs straight up and the feet at the baby's head (frank breech).  When one or both of the baby's feet are showing first in the birth canal along with the buttocks (footling breech). What are the health risks of having a breech birth? Having a breech birth increases the health risks to your baby. A breech birth may cause the following:  Umbilical cord prolapse. This is when the umbilical cord enters the birth canal ahead of the baby, before or during labor. This can cause the cord to become pinched or compressed as labor continues. This can reduce the flow of blood and oxygen to the baby.  The baby getting stuck in the birth canal, which can cause injury or, rarely, death.  Injury to the baby's nerves in the shoulder, arm, and hand (brachial plexus injury) when delivered. What increases the risk of having a breech baby? It is not known what causes your baby to be breech. However, you are more likely to have a breech baby if:  You have had a previous pregnancy.  You are having more than one baby.  Your baby has certain birth (congenital) defects.  You have started your labor earlier than expected (premature labor).  You have problems with your uterus, such as a tumor or abnormally shaped uterus.  You have too much or not enough fluid surrounding the baby (amniotic fluid). How do I know if my baby is breech? There are no symptoms for you to know that your baby is breech. When you are close to your due date, your health care provider can tell if your baby is breech by  doing:  An abdominal or vaginal (pelvic) exam.  An ultrasound. What can be done if my baby is breech?  Your health care provider may try to turn the baby in your uterus. He or she will use a procedure called external cephalic version (ECV). He or she will place both hands on your abdomen and gently and slowly turn the baby around. It is important to know that ECV can increase your chances of suddenly going into labor. For this reason, an ECV is only done toward the end of a healthy pregnancy. The baby may remain in this position, but sometimes he or she may turn back to the breech position. You and your health care provider will discuss if an ECV is recommended for you and your baby. How will I delivery my baby if he or she is breech? You and your health care provider will discuss the best way to deliver your baby. If your baby is breech, it is less likely that a vaginal delivery will be recommended due to the risks to you and your baby. Your health care provider may recommend that you deliver your baby through a Cesarean section (C-section). A C-section is the surgical delivery of a baby through an incision in the abdomen and the uterus. Summary  A breech birth is when a baby is born with the buttocks or feet first.  Having a breech birth may   increase the risks to your baby.  Your health care provider may try to turn your baby in your uterus using a procedure called an external cephalic version (ECV).  If your baby cannot be turned to a head down position or if your baby remains in a breech position, your health care provider will make recommendations about the safest way to deliver your baby. This information is not intended to replace advice given to you by your health care provider. Make sure you discuss any questions you have with your health care provider. Document Revised: 06/23/2017 Document Reviewed: 04/06/2017 Elsevier Patient Education  2020 [employer]  

## 2020-05-20 ENCOUNTER — Inpatient Hospital Stay (HOSPITAL_COMMUNITY): Admission: RE | Admit: 2020-05-20 | Payer: Medicaid Other | Source: Ambulatory Visit

## 2020-05-20 ENCOUNTER — Encounter (HOSPITAL_COMMUNITY)
Admission: RE | Disposition: A | Discharge status: Home / Self Care | Payer: Self-pay | Source: Home / Self Care | Attending: Obstetrics & Gynecology

## 2020-05-20 ENCOUNTER — Encounter (HOSPITAL_COMMUNITY): Payer: Self-pay | Admitting: Anesthesiology

## 2020-05-20 ENCOUNTER — Inpatient Hospital Stay (HOSPITAL_COMMUNITY)
Admission: RE | Admit: 2020-05-20 | Discharge: 2020-05-23 | DRG: 787 | Disposition: A | Discharge status: Home / Self Care | Payer: Medicaid Other | Attending: Obstetrics & Gynecology | Admitting: Obstetrics & Gynecology

## 2020-05-20 ENCOUNTER — Encounter (HOSPITAL_COMMUNITY): Payer: Self-pay | Admitting: Obstetrics & Gynecology

## 2020-05-20 ENCOUNTER — Inpatient Hospital Stay (HOSPITAL_COMMUNITY): Payer: Medicaid Other | Admitting: Anesthesiology

## 2020-05-20 DIAGNOSIS — Q51810 Arcuate uterus: Secondary | ICD-10-CM | POA: Diagnosis not present

## 2020-05-20 DIAGNOSIS — F32A Depression, unspecified: Secondary | ICD-10-CM | POA: Diagnosis present

## 2020-05-20 DIAGNOSIS — E039 Hypothyroidism, unspecified: Secondary | ICD-10-CM | POA: Diagnosis present

## 2020-05-20 DIAGNOSIS — Z3490 Encounter for supervision of normal pregnancy, unspecified, unspecified trimester: Secondary | ICD-10-CM | POA: Diagnosis present

## 2020-05-20 DIAGNOSIS — O328XX0 Maternal care for other malpresentation of fetus, not applicable or unspecified: Principal | ICD-10-CM | POA: Diagnosis present

## 2020-05-20 DIAGNOSIS — E669 Obesity, unspecified: Secondary | ICD-10-CM | POA: Diagnosis present

## 2020-05-20 DIAGNOSIS — O409XX0 Polyhydramnios, unspecified trimester, not applicable or unspecified: Secondary | ICD-10-CM | POA: Diagnosis present

## 2020-05-20 DIAGNOSIS — Z20822 Contact with and (suspected) exposure to covid-19: Secondary | ICD-10-CM | POA: Diagnosis present

## 2020-05-20 DIAGNOSIS — Z3A39 39 weeks gestation of pregnancy: Secondary | ICD-10-CM

## 2020-05-20 DIAGNOSIS — O2620 Pregnancy care for patient with recurrent pregnancy loss, unspecified trimester: Secondary | ICD-10-CM | POA: Diagnosis present

## 2020-05-20 DIAGNOSIS — O99344 Other mental disorders complicating childbirth: Secondary | ICD-10-CM | POA: Diagnosis present

## 2020-05-20 DIAGNOSIS — G40909 Epilepsy, unspecified, not intractable, without status epilepticus: Secondary | ICD-10-CM | POA: Diagnosis present

## 2020-05-20 DIAGNOSIS — F17210 Nicotine dependence, cigarettes, uncomplicated: Secondary | ICD-10-CM | POA: Diagnosis present

## 2020-05-20 DIAGNOSIS — O99334 Smoking (tobacco) complicating childbirth: Secondary | ICD-10-CM | POA: Diagnosis present

## 2020-05-20 DIAGNOSIS — O0990 Supervision of high risk pregnancy, unspecified, unspecified trimester: Secondary | ICD-10-CM

## 2020-05-20 DIAGNOSIS — O99214 Obesity complicating childbirth: Secondary | ICD-10-CM | POA: Diagnosis present

## 2020-05-20 DIAGNOSIS — O3403 Maternal care for unspecified congenital malformation of uterus, third trimester: Secondary | ICD-10-CM | POA: Diagnosis present

## 2020-05-20 DIAGNOSIS — O99284 Endocrine, nutritional and metabolic diseases complicating childbirth: Secondary | ICD-10-CM | POA: Diagnosis present

## 2020-05-20 DIAGNOSIS — O99354 Diseases of the nervous system complicating childbirth: Secondary | ICD-10-CM | POA: Diagnosis present

## 2020-05-20 DIAGNOSIS — O321XX0 Maternal care for breech presentation, not applicable or unspecified: Secondary | ICD-10-CM | POA: Diagnosis present

## 2020-05-20 LAB — CBC
HCT: 32.1 % — ABNORMAL LOW (ref 36.0–46.0)
Hemoglobin: 10.7 g/dL — ABNORMAL LOW (ref 12.0–15.0)
MCH: 32.1 pg (ref 26.0–34.0)
MCHC: 33.3 g/dL (ref 30.0–36.0)
MCV: 96.4 fL (ref 80.0–100.0)
Platelets: 213 10*3/uL (ref 150–400)
RBC: 3.33 MIL/uL — ABNORMAL LOW (ref 3.87–5.11)
RDW: 12.7 % (ref 11.5–15.5)
WBC: 7.7 10*3/uL (ref 4.0–10.5)
nRBC: 0 % (ref 0.0–0.2)

## 2020-05-20 LAB — TYPE AND SCREEN
ABO/RH(D): A POS
Antibody Screen: NEGATIVE

## 2020-05-20 LAB — RESPIRATORY PANEL BY RT PCR (FLU A&B, COVID)
Influenza A by PCR: NEGATIVE
Influenza B by PCR: NEGATIVE
SARS Coronavirus 2 by RT PCR: NEGATIVE

## 2020-05-20 LAB — RPR: RPR Ser Ql: NONREACTIVE

## 2020-05-20 SURGERY — Surgical Case
Anesthesia: Regional

## 2020-05-20 SURGERY — Surgical Case
Anesthesia: Spinal

## 2020-05-20 MED ORDER — FLEET ENEMA 7-19 GM/118ML RE ENEM: 1.0000 | ENEMA | RECTAL | Status: DC | PRN

## 2020-05-20 MED ORDER — LACTATED RINGERS IV SOLN: 500.0000 mL | Freq: Once | INTRAVENOUS | Status: AC

## 2020-05-20 MED ORDER — LACTATED RINGERS IV SOLN: INTRAVENOUS | Status: DC

## 2020-05-20 MED ORDER — FENTANYL CITRATE (PF) 100 MCG/2ML IJ SOLN
INTRAMUSCULAR | Status: DC | PRN
Start: 2020-05-20 — End: 2020-05-20
  Administered 2020-05-20: 15 ug via INTRATHECAL

## 2020-05-20 MED ORDER — OXYTOCIN BOLUS FROM INFUSION: 333.0000 mL | Freq: Once | INTRAVENOUS | Status: DC

## 2020-05-20 MED ORDER — OXYTOCIN-SODIUM CHLORIDE 30-0.9 UT/500ML-% IV SOLN
INTRAVENOUS | Status: AC
  Filled 2020-05-20: qty 500

## 2020-05-20 MED ORDER — SODIUM CHLORIDE 0.9 % IV SOLN
2.0000 g | INTRAVENOUS | Status: AC
  Administered 2020-05-20: 2 g via INTRAVENOUS
  Filled 2020-05-20: qty 2

## 2020-05-20 MED ORDER — SOD CITRATE-CITRIC ACID 500-334 MG/5ML PO SOLN: 30.0000 mL | ORAL | Status: DC | PRN

## 2020-05-20 MED ORDER — OXYCODONE-ACETAMINOPHEN 5-325 MG PO TABS: 2.0000 | ORAL_TABLET | ORAL | Status: DC | PRN

## 2020-05-20 MED ORDER — FENTANYL-BUPIVACAINE-NACL 0.5-0.125-0.9 MG/250ML-% EP SOLN: 12.0000 mL/h | EPIDURAL | Status: DC | PRN

## 2020-05-20 MED ORDER — LIDOCAINE HCL (PF) 1 % IJ SOLN: 30.0000 mL | INTRAMUSCULAR | Status: DC | PRN

## 2020-05-20 MED ORDER — MORPHINE SULFATE (PF) 0.5 MG/ML IJ SOLN
INTRAMUSCULAR | Status: DC | PRN
Start: 2020-05-20 — End: 2020-05-20
  Administered 2020-05-20: 150 ug via INTRATHECAL

## 2020-05-20 MED ORDER — DIPHENHYDRAMINE HCL 50 MG/ML IJ SOLN: 12.5000 mg | INTRAMUSCULAR | Status: DC | PRN

## 2020-05-20 MED ORDER — OXYTOCIN-SODIUM CHLORIDE 30-0.9 UT/500ML-% IV SOLN: 1.0000 m[IU]/min | INTRAVENOUS | Status: DC

## 2020-05-20 MED ORDER — SOD CITRATE-CITRIC ACID 500-334 MG/5ML PO SOLN
30.0000 mL | ORAL | Status: DC | PRN
  Administered 2020-05-20: 30 mL via ORAL
  Filled 2020-05-20: qty 15

## 2020-05-20 MED ORDER — OXYTOCIN-SODIUM CHLORIDE 30-0.9 UT/500ML-% IV SOLN: 2.5000 [IU]/h | INTRAVENOUS | Status: DC

## 2020-05-20 MED ORDER — PHENYLEPHRINE 40 MCG/ML (10ML) SYRINGE FOR IV PUSH (FOR BLOOD PRESSURE SUPPORT): 80.0000 ug | PREFILLED_SYRINGE | INTRAVENOUS | Status: DC | PRN

## 2020-05-20 MED ORDER — PHENYLEPHRINE HCL-NACL 20-0.9 MG/250ML-% IV SOLN
INTRAVENOUS | Status: AC
  Filled 2020-05-20: qty 250

## 2020-05-20 MED ORDER — MEPERIDINE HCL 25 MG/ML IJ SOLN
INTRAMUSCULAR | Status: DC | PRN
  Administered 2020-05-20 (×2): 12.5 mg via INTRAVENOUS

## 2020-05-20 MED ORDER — PHENYLEPHRINE HCL-NACL 20-0.9 MG/250ML-% IV SOLN
INTRAVENOUS | Status: DC | PRN
  Administered 2020-05-20: 60 ug/min via INTRAVENOUS

## 2020-05-20 MED ORDER — MISOPROSTOL 200 MCG PO TABS: 50.0000 ug | ORAL_TABLET | ORAL | Status: DC | PRN

## 2020-05-20 MED ORDER — TERBUTALINE SULFATE 1 MG/ML IJ SOLN
0.2500 mg | Freq: Once | INTRAMUSCULAR | Status: DC | PRN
  Filled 2020-05-20: qty 1

## 2020-05-20 MED ORDER — MISOPROSTOL 50MCG HALF TABLET
50.0000 ug | ORAL_TABLET | ORAL | Status: DC
  Administered 2020-05-20: 50 ug via BUCCAL
  Filled 2020-05-20 (×6): qty 1

## 2020-05-20 MED ORDER — OXYTOCIN-SODIUM CHLORIDE 30-0.9 UT/500ML-% IV SOLN
INTRAVENOUS | Status: DC | PRN
  Administered 2020-05-20: 300 mL via INTRAVENOUS

## 2020-05-20 MED ORDER — ONDANSETRON HCL 4 MG/2ML IJ SOLN
INTRAMUSCULAR | Status: AC
  Filled 2020-05-20: qty 2

## 2020-05-20 MED ORDER — TERBUTALINE SULFATE 1 MG/ML IJ SOLN
INTRAMUSCULAR | Status: AC
  Filled 2020-05-20: qty 1

## 2020-05-20 MED ORDER — LIDOCAINE HCL (PF) 1 % IJ SOLN
30.0000 mL | INTRAMUSCULAR | Status: DC | PRN
  Filled 2020-05-20: qty 30

## 2020-05-20 MED ORDER — LACTATED RINGERS IV SOLN: 500.0000 mL | INTRAVENOUS | Status: DC | PRN

## 2020-05-20 MED ORDER — ACETAMINOPHEN 325 MG PO TABS: 650.0000 mg | ORAL_TABLET | ORAL | Status: DC | PRN

## 2020-05-20 MED ORDER — DEXAMETHASONE SODIUM PHOSPHATE 10 MG/ML IJ SOLN
INTRAMUSCULAR | Status: AC
  Filled 2020-05-20: qty 1

## 2020-05-20 MED ORDER — LEVETIRACETAM IN NACL 1000 MG/100ML IV SOLN
1000.0000 mg | Freq: Once | INTRAVENOUS | Status: AC
  Administered 2020-05-20: 1000 mg via INTRAVENOUS
  Filled 2020-05-20: qty 100

## 2020-05-20 MED ORDER — MEPERIDINE HCL 25 MG/ML IJ SOLN
INTRAMUSCULAR | Status: AC
  Filled 2020-05-20: qty 1

## 2020-05-20 MED ORDER — TERBUTALINE SULFATE 1 MG/ML IJ SOLN
0.2500 mg | Freq: Once | INTRAMUSCULAR | Status: AC
  Administered 2020-05-20: 0.25 mg via SUBCUTANEOUS

## 2020-05-20 MED ORDER — SODIUM CHLORIDE 0.9 % IV SOLN: 2.0000 g | INTRAVENOUS | Status: DC

## 2020-05-20 MED ORDER — FENTANYL CITRATE (PF) 100 MCG/2ML IJ SOLN: 50.0000 ug | INTRAMUSCULAR | Status: DC | PRN

## 2020-05-20 MED ORDER — DEXAMETHASONE SODIUM PHOSPHATE 10 MG/ML IJ SOLN
INTRAMUSCULAR | Status: DC | PRN
  Administered 2020-05-20: 10 mg via INTRAVENOUS

## 2020-05-20 MED ORDER — ONDANSETRON HCL 4 MG/2ML IJ SOLN: 4.0000 mg | Freq: Four times a day (QID) | INTRAMUSCULAR | Status: DC | PRN

## 2020-05-20 MED ORDER — MORPHINE SULFATE (PF) 0.5 MG/ML IJ SOLN
INTRAMUSCULAR | Status: AC
  Filled 2020-05-20: qty 10

## 2020-05-20 MED ORDER — FENTANYL CITRATE (PF) 100 MCG/2ML IJ SOLN
INTRAMUSCULAR | Status: AC
  Filled 2020-05-20: qty 2

## 2020-05-20 MED ORDER — EPHEDRINE 5 MG/ML INJ: 10.0000 mg | INTRAVENOUS | Status: DC | PRN

## 2020-05-20 MED ORDER — MISOPROSTOL 50MCG HALF TABLET: 50.0000 ug | ORAL_TABLET | ORAL | Status: DC | PRN

## 2020-05-20 MED ORDER — OXYCODONE-ACETAMINOPHEN 5-325 MG PO TABS: 1.0000 | ORAL_TABLET | ORAL | Status: DC | PRN

## 2020-05-20 MED ORDER — BUPIVACAINE IN DEXTROSE 0.75-8.25 % IT SOLN
INTRATHECAL | Status: DC | PRN
  Administered 2020-05-20: 1.6 mL via INTRATHECAL

## 2020-05-20 MED ORDER — TERBUTALINE SULFATE 1 MG/ML IJ SOLN: 0.2500 mg | Freq: Once | INTRAMUSCULAR | Status: DC | PRN

## 2020-05-20 MED ORDER — ONDANSETRON HCL 4 MG/2ML IJ SOLN
INTRAMUSCULAR | Status: DC | PRN
  Administered 2020-05-20: 4 mg via INTRAVENOUS

## 2020-05-20 MED ORDER — SODIUM CHLORIDE 0.9 % IV SOLN
INTRAVENOUS | Status: AC
  Filled 2020-05-20: qty 2

## 2020-05-20 MED ORDER — LEVETIRACETAM 500 MG PO TABS
1000.0000 mg | ORAL_TABLET | Freq: Two times a day (BID) | ORAL | Status: DC
  Filled 2020-05-20: qty 2

## 2020-05-20 SURGICAL SUPPLY — 31 items
CHLORAPREP W/TINT 26ML (MISCELLANEOUS) ×3 IMPLANT
CLAMP CORD UMBIL (MISCELLANEOUS) IMPLANT
CLOTH BEACON ORANGE TIMEOUT ST (SAFETY) ×3 IMPLANT
DRSG OPSITE POSTOP 4X10 (GAUZE/BANDAGES/DRESSINGS) ×3 IMPLANT
ELECT REM PT RETURN 9FT ADLT (ELECTROSURGICAL) ×3
ELECTRODE REM PT RTRN 9FT ADLT (ELECTROSURGICAL) ×1 IMPLANT
EXTRACTOR VACUUM M CUP 4 TUBE (SUCTIONS) IMPLANT
EXTRACTOR VACUUM M CUP 4' TUBE (SUCTIONS)
GAUZE SPONGE 4X4 12PLY STRL LF (GAUZE/BANDAGES/DRESSINGS) ×6 IMPLANT
GLOVE BIOGEL PI IND STRL 7.0 (GLOVE) ×3 IMPLANT
GLOVE BIOGEL PI INDICATOR 7.0 (GLOVE) ×6
GLOVE ECLIPSE 7.0 STRL STRAW (GLOVE) ×3 IMPLANT
GOWN STRL REUS W/TWL LRG LVL3 (GOWN DISPOSABLE) ×6 IMPLANT
KIT ABG SYR 3ML LUER SLIP (SYRINGE) IMPLANT
NEEDLE HYPO 22GX1.5 SAFETY (NEEDLE) ×3 IMPLANT
NEEDLE HYPO 25X5/8 SAFETYGLIDE (NEEDLE) ×3 IMPLANT
NS IRRIG 1000ML POUR BTL (IV SOLUTION) ×3 IMPLANT
PACK C SECTION WH (CUSTOM PROCEDURE TRAY) ×3 IMPLANT
PAD ABD 7.5X8 STRL (GAUZE/BANDAGES/DRESSINGS) ×3 IMPLANT
PAD OB MATERNITY 4.3X12.25 (PERSONAL CARE ITEMS) ×3 IMPLANT
PENCIL SMOKE EVAC W/HOLSTER (ELECTROSURGICAL) ×3 IMPLANT
RTRCTR C-SECT PINK 25CM LRG (MISCELLANEOUS) ×3 IMPLANT
SUT PDS AB 0 CTX 36 PDP370T (SUTURE) IMPLANT
SUT PLAIN 2 0 XLH (SUTURE) IMPLANT
SUT VIC AB 0 CTX 36 (SUTURE) ×6
SUT VIC AB 0 CTX36XBRD ANBCTRL (SUTURE) ×2 IMPLANT
SUT VIC AB 4-0 KS 27 (SUTURE) ×3 IMPLANT
SYR CONTROL 10ML LL (SYRINGE) ×3 IMPLANT
TOWEL OR 17X24 6PK STRL BLUE (TOWEL DISPOSABLE) ×3 IMPLANT
TRAY FOLEY W/BAG SLVR 14FR LF (SET/KITS/TRAYS/PACK) ×3 IMPLANT
WATER STERILE IRR 1000ML POUR (IV SOLUTION) ×6 IMPLANT

## 2020-05-20 NOTE — Anesthesia Preprocedure Evaluation (Signed)
Anesthesia Evaluation  Patient identified by MRN, date of birth, ID band Patient awake    Reviewed: Allergy & Precautions, H&P , NPO status , Patient's Chart, lab work & pertinent test results, reviewed documented beta blocker date and time   Airway Mallampati: I  TM Distance: >3 FB Neck ROM: full    Dental no notable dental hx. (+) Teeth Intact, Dental Advisory Given,    Pulmonary neg pulmonary ROS, Current Smoker,    Pulmonary exam normal breath sounds clear to auscultation       Cardiovascular negative cardio ROS Normal cardiovascular exam Rhythm:regular Rate:Normal     Neuro/Psych  Headaches, Seizures -, Well Controlled,  negative psych ROS   GI/Hepatic negative GI ROS, Neg liver ROS,   Endo/Other  negative endocrine ROS  Renal/GU negative Renal ROS  negative genitourinary   Musculoskeletal   Abdominal   Peds  Hematology negative hematology ROS (+)   Anesthesia Other Findings   Reproductive/Obstetrics (+) Pregnancy                             Anesthesia Physical Anesthesia Plan  ASA: II and emergent  Anesthesia Plan: Spinal   Post-op Pain Management:    Induction:   PONV Risk Score and Plan:   Airway Management Planned: Natural Airway  Additional Equipment:   Intra-op Plan:   Post-operative Plan:   Informed Consent: I have reviewed the patients History and Physical, chart, labs and discussed the procedure including the risks, benefits and alternatives for the proposed anesthesia with the patient or authorized representative who has indicated his/her understanding and acceptance.       Plan Discussed with: Anesthesiologist  Anesthesia Plan Comments:         Anesthesia Quick Evaluation

## 2020-05-20 NOTE — Progress Notes (Signed)
External Cephalic Version  Preprocedure Diagnosis:  29 y.o. G7P1 at [redacted]w[redacted]d weeks gestational age with breech presentation  Post-procedure Diagnosis: 29 y.o. L2G4010 at [redacted]w[redacted]d weeks gestational age with cephalic presentation  Procedure:  EC version successful  Procedure in detail:   The patient was brought to Labor and Delivery where a reactive fetal heart tracing was obtained.  She was given 1 dose of subcutaneous terbutaline . A bedside ultrasound was performed which revealed single intrauterine pregnancy and breech presentation. There was noted to be adequate fluid. Using manual pressure, the breech was manipulated in a forward roll fashion until a vertex presentation was obtained. Fetal heart tones were checked intermittently during the procedure and were noted to be reassuring. Following successful external cephalic version, the patient was placed on continuous external fetal monitoring. Initially a variable deceleration was noted which resolved with baseline 135 BPM. She was noted to have a reassuring and reactive tracing  following the external cephalic version.   Adam Phenix, MD 05/20/2020 9:31 AM

## 2020-05-20 NOTE — H&P (Signed)
Brittany Hendrix is a 29 y.o. female presenting for attempted ECV for breech presentation. She had SVD 8 lb baby at 42 weeks last pregnancy, agrees to try ECV. OB History    Gravida  7   Para  1   Term  1   Preterm  0   AB  5   Living  1     SAB  4   TAB  1   Ectopic  0   Multiple  0   Live Births  1          Past Medical History:  Diagnosis Date  . Abortion history   . Headache(784.0)   . Pregnant   . Seizures [facility])    Last seizure March 2020  . Supervision of high-risk pregnancy 05/28/2015    Clinic [facility] Prenatal Labs  Dating 5 week scan Blood type:   A pos  Genetic Screen Normal NT/NB 1 Screen:        AFP:     Antibody: neg  Anatomic [place]  Rubella:  immune  GTT Early: 124          Third trimester:  RPR:   NR  Flu vaccine Declined HBsAg:   neg  TDaP vaccine                                           HIV: Non Reactive (09/26 0020)   Baby Food Breast                                           Past Surgical History:  Procedure Laterality Date  . DILATION AND CURETTAGE OF UTERUS     abortion  . DILATION AND EVACUATION N/A 10/04/2017   Procedure: DILATATION AND EVACUATION;  Surgeon: Silverio Lay, MD;  Location: [facility];  Service: Gynecology;  Laterality: N/A;   Family History: family history includes Asthma in her brother; Diabetes in her mother. Social History:  reports that she has been smoking cigarettes. She has a 2.50 pack-year smoking history. She has never used smokeless tobacco. She reports previous alcohol use. She reports current drug use. Drug: Marijuana.     Maternal Diabetes: No Genetic Screening: Normal Maternal Ultrasounds/Referrals: Normal Fetal Ultrasounds or other Referrals:  None Maternal Substance Abuse:  No Significant Maternal Medications:  None Significant Maternal Lab Results:  None Other Comments:  had polyhydramnios which resolved  Review of Systems Maternal Medical History:  Reason for admission: breech  Fetal activity: Perceived  fetal activity is normal.    Prenatal complications: no prenatal complications Prenatal Complications - Diabetes: none.      Last menstrual period 08/16/2019, unknown if currently breastfeeding. Maternal Exam:  Abdomen: Patient reports no abdominal tenderness. Fetal presentation: breech  Introitus: not evaluated.   Cervix: not evaluated.   Physical Exam Vitals reviewed.  Constitutional:      Appearance: Normal appearance.  Eyes:     Pupils: Pupils are equal, round, and reactive to light.  Cardiovascular:     Rate and Rhythm: Normal rate.  Pulmonary:     Effort: Pulmonary effort is normal.  Musculoskeletal:     Cervical back: Normal range of motion.  Neurological:     Mental Status: She is alert.  Psychiatric:  Mood and Affect: Mood normal.        Behavior: Behavior normal.     Prenatal labs: ABO, Rh: A/Positive/-- (04/15 1403) Antibody: Negative (04/15 1403) Rubella: 5.46 (04/15 1403) RPR: Non Reactive (08/20 1042)  HBsAg: Negative (04/15 1403)  HIV: Non Reactive (08/20 1042)  GBS: Negative/-- (10/12 1201)   Assessment/Plan: Patient Active Problem List   Diagnosis Date Noted  . Polyhydramnios affecting pregnancy 03/27/2020  . Obesity during pregnancy, antepartum 01/02/2020  . Supervision of high risk pregnancy, antepartum 11/07/2019  . 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 Nonintractable epilepsy without status epilepticus (HCC) 04/17/2017  . Obesity (BMI 35.0-39.9 without comorbidity) 12/25/2015  . Seizure disorder in pregnancy, antepartum (HCC) 05/28/2015  . 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 After informed verbal consent, Terbutaline 0.25 mg SQ Risk of pain, fetal intolerance, need for CS, labor, ROM, bleeding, failed attempt all discussed and questions were answered. If successful and no complications she may go home.  Scheryl Darter 05/20/2020, 8:43 AM

## 2020-05-20 NOTE — Progress Notes (Signed)
Labor Progress Note Brittany Hendrix is a 29 y.o. [phone] at [redacted]w[redacted]d presented for IOL after fetal deceleration during ECV on 10/27.  S: Patient with successful ECV today. Doing well without complaints.  O:  Temp 98.4 F (36.9 C) (Oral)   Resp 18   LMP 08/16/2019  EFM: 150bpm/mod variability/+accels/no decels Toco: irritable  CVE:     A&P: 29 y.o. H2C9470 [redacted]w[redacted]d presented for IOL after fetal deceleration during ECV on 10/27. #IOL: Successful version today, fetal deceleration noted after version so decision made to initiate induction given gestational age. Patient amendable. Discussed induction process. Cooks catheter placed this check after risks/benefits discussed. Will initiate cytotec. #Pain: PRN #FWB: cat 1 #GBS negative #Seizure disorder: stable on keppra 1000 mg BID, last seizure 1 year ago. Followed by neurology. Continue home keppra, already had AM dose will start tonight. #Polyhydramnios: noted on u/s 8/27 (AFI 29.5), noted to be resolved on u/s 9/24. Elevated AFI noted during ECV today, not calculated. 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 Declines contraception, counseled.  Alric Seton, MD 11:51 AM

## 2020-05-20 NOTE — Discharge Summary (Addendum)
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 yo [id number] admitted to the hospital 05/20/2020 for induction of labor. She arrived for scheduled ECV and initially had successful version of baby to cephalic presentation. She had an IP Foley bulb placed, however shortly after was identified to have converted back to breech presentation. The patient went for cesarean section due to Malpresentation. Delivery details are as follows: Membrane Rupture Time/Date: 11:04 PM ,05/20/2020   Delivery Method:C-Section, Low Transverse  Details of operation can be found in separate operative Note.  Patient had an uncomplicated postpartum course. She is ambulating, tolerating a regular diet, passing flatus, and urinating well.  Patient is discharged home in stable condition on 05/23/20.      Newborn Data: Birth date:05/20/2020  Birth time:11:05 PM  Gender:Female  Living status:Living  Apgars:8 ,9   Weight:2900 g                                 Magnesium Sulfate received: No BMZ received: No Rhophylac:N/A MMR:N/A T-DaP: declined prenatally Flu: No declined prenatally  Transfusion:No  Physical exam  Vitals:   05/22/20 1048 05/22/20 1353 05/22/20 2126 05/23/20 0536  BP: (!) 89/51 (!) 94/55 (!) 96/59 (!) 89/48  Pulse: (!) 48 (!) 53 (!) 59 (!) 48  Resp:   18 18  Temp: 97.9 F (36.6 C) 97.8 F (36.6 C) 98.4 F (36.9 C) 98.4 F (36.9 C)  TempSrc: Oral  Oral Oral  SpO2:  100% 100% 99%  Weight:      Height:       General: alert, cooperative and no distress Lochia: appropriate Uterine Fundus: firm Incision: Healing well with no significant drainage, No significant erythema, Dressing is clean, dry, and intact DVT Evaluation: No evidence of DVT seen on physical exam. No cords or calf tenderness. No significant calf/ankle edema. Labs: Lab Results  Component Value Date   WBC 16.5 (H) 05/21/2020   HGB 11.8 (L) 05/21/2020   HCT 34.9 (L) 05/21/2020   MCV 96.7 05/21/2020   PLT 247 05/21/2020   CMP Latest Ref Rng & Units 05/21/2020  Glucose 70 - 99 mg/dL -  BUN 6 - 20 mg/dL -  Creatinine 0.44 - 1.00 mg/dL 0.57  Sodium 135 - 145 mmol/L -  Potassium 3.5 - 5.1 mmol/L -  Chloride 98 -  111 mmol/L -  CO2 22 - 32 mmol/L -  Calcium 8.9 - 10.3 mg/dL -  Total Protein 6.5 - 8.1 g/dL -  Total Bilirubin 0.3 - 1.2 mg/dL -  Alkaline Phos 38 - 126 U/L -  AST 15 - 41 U/L -  ALT 0 - 44 U/L -   Edinburgh Score: Edinburgh Postnatal Depression Scale Screening Tool 05/21/2020  I have been able to laugh and see the funny side of things. 0  I have looked forward with enjoyment to things. 0  I have blamed myself unnecessarily when things went wrong. 2  I have been anxious or worried for no good reason. 2  I have felt scared or panicky for no good reason. 0  Things have been getting on top of me. 1  I have been so unhappy that I have had difficulty sleeping. 1  I have felt sad or  miserable. 1  I have been so unhappy that I have been crying. 1  The thought of harming myself has occurred to me. 0  Edinburgh Postnatal Depression Scale Total 8     After visit meds:  Allergies as of 05/23/2020   No Known Allergies     Medication List    STOP taking these medications   aspirin EC 81 MG tablet   Comfort Fit Maternity Supp Sm Misc   levETIRAcetam 1000 MG tablet Commonly known as: KEPPRA   PNV PO     TAKE these medications   acetaminophen 500 MG tablet Commonly known as: TYLENOL Take 1 tablet (500 mg total) by mouth every 6 (six) hours as needed.   coconut oil Oil Apply 1 application topically as needed.   ibuprofen 600 MG tablet Commonly known as: ADVIL Take 1 tablet (600 mg total) by mouth every 6 (six) hours.        Discharge home in stable condition Infant Feeding: Bottle and Breast Infant Disposition:home with mother Discharge instruction: per After Visit Summary and Postpartum booklet. Activity: Advance as tolerated. Pelvic rest for 6 weeks.  Diet: routine diet Future Appointments: Future Appointments  Date Time Provider [place]  06/01/2020  1:40 PM [place] None  06/23/2020  2:45 PM Cephas Darby, MD [place] None  08/11/2020  2:15 PM Suzzanne Cloud, NP [facility] None   Follow up Visit:   Please schedule this patient for a In person postpartum visit in 6 weeks with the following provider: Any provider. Additional Postpartum F/U:Incision check 1 week  Low risk pregnancy complicated by: seizure disorder on keppra Delivery mode:  C-Section, Low Transverse  Anticipated Birth Control:  declined   05/23/2020 [name], MD  Midwife attestation I have seen and examined this patient and agree with above documentation in the resident's note.   Brittany Hendrix is a 29 y.o. [phone] s/p PLTCS for breech presentation.   Pain is well controlled.  Plan for birth control is no method.  Method of Feeding: bottle  and breast  PE:  BP (!) 89/48 (BP Location: Left Arm)   Pulse (!) 48   Temp 98.4 F (36.9 C) (Oral)   Resp 18   Ht '5\' 4"'  (1.626 m)   Wt 93.1 kg   LMP 08/16/2019   SpO2 99%   Breastfeeding Unknown   BMI 35.24 kg/m  Gen: well appearing Heart: reg rate Lungs: normal WOB Fundus firm Ext: soft, no pain, no edema  Recent Labs    05/21/20 0626  HGB 11.8*  HCT 34.9*  Plan: discharge today - postpartum care discussed - f/u clinic in 6 weeks for postpartum visit   Fatima Blank, CNM 10:59 AM

## 2020-05-20 NOTE — Transfer of Care (Signed)
Immediate Anesthesia Transfer of Care Note  Patient: Brittany Hendrix  Procedure(s) Performed: CESAREAN SECTION (N/A )  Patient Location: PACU  Anesthesia Type:Spinal  Level of Consciousness: awake, alert  and oriented  Airway & Oxygen Therapy: Patient Spontanous Breathing  Post-op Assessment: Report given to RN and Post -op Vital signs reviewed and stable  Post vital signs: Reviewed and stable  Last Vitals:  Vitals Value Taken Time  BP 120/109 05/20/20 2352  Temp    Pulse 75 05/20/20 2353  Resp 18 05/20/20 2353  SpO2 100 % 05/20/20 2353  Vitals shown include unvalidated device data.  Last Pain:  Vitals:   05/20/20 2100  TempSrc:   PainSc: 3          Complications: No complications documented.

## 2020-05-20 NOTE — Anesthesia Procedure Notes (Signed)
Spinal  Patient location during procedure: OR Start time: 05/20/2020 10:40 PM End time: 05/20/2020 10:42 PM Staffing Anesthesiologist: Bethena Midget, MD Preanesthetic Checklist Completed: patient identified, IV checked, site marked, risks and benefits discussed, surgical consent, monitors and equipment checked, pre-op evaluation and timeout performed Spinal Block Patient position: sitting Prep: DuraPrep Patient monitoring: heart rate, cardiac monitor, continuous pulse ox and blood pressure Approach: midline Location: L4-5 Injection technique: single-shot Needle Needle type: Sprotte  Needle gauge: 24 G Needle length: 9 cm Assessment Sensory level: T4

## 2020-05-20 NOTE — Progress Notes (Signed)
Labor Progress Note Brittany Hendrix is a 29 y.o. [phone] at [redacted]w[redacted]d presented for IOL after fetal deceleration during ECV on 10/27. Called to evaluate patient as the fetus is noted to be in breech presentation again.   S: Doing well without complaints. Worried about her fetal presentation being breech again  O:  BP (!) 82/54   Pulse 80   Temp 98 F (36.7 C) (Oral)   Resp 18   LMP 08/16/2019  EFM: 150bpm/mod variability/+accels/no decels Toco: rare  CVE: Dilation: 5 Effacement (%): 80 Station: Ballotable Presentation: Complete Breech (via ultrasound) Exam by:: [facility]stone  Fetal presentation noted to be breech again. Counseled about repeat version attempt versus cesarean delivery. Of note, patient had a full meal at 1630. She desires ECV.  Discussed risks of fetal distress which may lead to emergent cesarean delivery, failure of method also necessitating cesarean delivery.  Consented for procedure. Terbutaline [place] dose given.  ECV Attempt Reactive tracing prior to ECV attempt.  Fetal head noted in maternal upper right fundal position, back on right.  After application of gel liberally, attempt was made to rotate the fetus in a clockwise direction.  The fetal head was able to be moved to right lower fundal area, but while monitoring heart rate, the fetus moved back up to initial position. FHR remained reassuring. After one more failed attempt, the procedure was aborted. Category 1 FHR tracing noted after ECV attempt.   A&P: 29 y.o. [id number] [redacted]w[redacted]d presented for IOL after fetal deceleration during ECV on 10/27. Initally was successful, IOL started with foley bulb which is now out.  Failed repeat ECV attempt now.   Cesarean delivery recommended. The risks of surgery were discussed with the patient including but were not limited to: bleeding which may require transfusion or reoperation; infection which may require antibiotics; injury to bowel, bladder, ureters or other surrounding organs; injury  to the fetus; need for additional procedures including hysterectomy in the event of a life-threatening hemorrhage; formation of adhesions; placental abnormalities wth subsequent pregnancies; incisional problems; thromboembolic phenomenon and other postoperative/anesthesia complications.  The patient concurred with the proposed plan, giving informed written consent for the procedure.   Patient has been NPO since 1630 she will remain NPO for procedure. Anesthesia and OR aware.  Preoperative prophylactic antibiotics and SCDs ordered on call to the OR.  If patient is stable, the standard waiting time after a meal is about 8 hours as per Anesthesiologist's recommendations.   If patient progresses in labor and fetus still in breech presentation, will need to proceed with urgent cesarean delivery. Will continue to monitor patient closely.    Jaynie Collins, MD, FACOG Obstetrician & Gynecologist, [facility] for [employer], [facility]

## 2020-05-20 NOTE — Op Note (Signed)
Brittany Hendrix PROCEDURE DATE: 05/20/2020  PREOPERATIVE DIAGNOSES: Intrauterine pregnancy at [redacted]w[redacted]d weeks gestation; malpresentation: breech presentation; failed second attempt of external cephalic version; arcuate uterus  POSTOPERATIVE DIAGNOSES: The same  PROCEDURE: Primary Low Transverse Cesarean Section  SURGEON:  Dr. Jaynie Collins  ASSISTANT:  Dr. Casper Harrison  ANESTHESIOLOGY TEAM: Anesthesiologist: Bethena Midget, MD CRNA: Junious Silk, CRNA  INDICATIONS: Brittany Hendrix is a 29 y.o. [phone] at [redacted]w[redacted]d here for cesarean section secondary to the indications listed under preoperative diagnoses; please see preoperative note for further details.  The risks of surgery were discussed with the patient including but were not limited to: bleeding which may require transfusion or reoperation; infection which may require antibiotics; injury to bowel, bladder, ureters or other surrounding organs; injury to the fetus; need for additional procedures including hysterectomy in the event of a life-threatening hemorrhage; formation of adhesions; placental abnormalities wth subsequent pregnancies; incisional problems; thromboembolic phenomenon and other postoperative/anesthesia complications.  The patient concurred with the proposed plan, giving informed written consent for the procedure.    FINDINGS:  Viable female infant in in double footling breech presentation.  Apgars 8 and 9.  Leg cord x 2. Copious clear amniotic fluid.  Intact placenta, three vessel cord.  Normal uterus, fallopian tubes and ovaries bilaterally.  ANESTHESIA: Spinal INTRAVENOUS FLUIDS: 2000 ml   ESTIMATED BLOOD LOSS: 400 ml URINE OUTPUT:  100 ml SPECIMENS: Placenta sent to L&D COMPLICATIONS: None immediate  PROCEDURE IN DETAIL:  The patient preoperatively received intravenous antibiotics and had sequential compression devices applied to her lower extremities.  She was then taken to the operating room where spinal anesthesia  was administered and was found to be adequate. She was then placed in a dorsal supine position with a leftward tilt, and prepped and draped in a sterile manner.  A foley catheter was placed into her bladder and attached to constant gravity.  After an adequate timeout was performed, a Pfannenstiel skin incision was made with scalpel and carried through to the underlying layer of fascia. The fascia was incised in the midline, and this incision was extended bilaterally using the Mayo scissors.  Kocher clamps were applied to the superior aspect of the fascial incision and the underlying rectus muscles were dissected off bluntly and sharply.  A similar process was carried out on the inferior aspect of the fascial incision. The rectus muscles were separated in the midline and the peritoneum was entered bluntly. The Alexis self-retaining retractor was introduced into the abdominal cavity.  Attention was turned to the lower uterine segment where a low transverse hysterotomy was made with a scalpel and extended bilaterally bluntly.  The infant was successfully delivered in breech presentation, the cord was clamped and cut after one minute, and the infant was handed over to the awaiting neonatology team. Uterine massage was then administered, and the placenta delivered intact with a three-vessel cord. The uterus was then cleared of clots and debris.  The hysterotomy was closed with 0 Vicryl in a running locked fashion, and an imbricating layer was also placed with 0 Vicryl.  Figure-of-eight 0 Vicryl serosal stitches were placed to help with hemostasis.  The pelvis was cleared of all clot and debris. Hemostasis was confirmed on all surfaces.  The retractor was removed.  The peritoneum was closed with a 0 Vicryl running stitch. The fascia was then closed using 0 Vicryl in a running fashion.  The subcutaneous layer was irrigated, and the skin was closed with a 4-0 Vicryl subcuticular stitch. The  patient tolerated the procedure  well. Sponge, instrument and needle counts were correct x 3.  She was taken to the recovery room in stable condition.    Jaynie Collins, MD, FACOG Obstetrician & Gynecologist, [facility] for [employer], [facility]

## 2020-05-21 ENCOUNTER — Encounter (HOSPITAL_COMMUNITY): Payer: Self-pay | Admitting: Family Medicine

## 2020-05-21 LAB — CBC
HCT: 34.9 % — ABNORMAL LOW (ref 36.0–46.0)
Hemoglobin: 11.8 g/dL — ABNORMAL LOW (ref 12.0–15.0)
MCH: 32.7 pg (ref 26.0–34.0)
MCHC: 33.8 g/dL (ref 30.0–36.0)
MCV: 96.7 fL (ref 80.0–100.0)
Platelets: 247 10*3/uL (ref 150–400)
RBC: 3.61 MIL/uL — ABNORMAL LOW (ref 3.87–5.11)
RDW: 12.6 % (ref 11.5–15.5)
WBC: 16.5 10*3/uL — ABNORMAL HIGH (ref 4.0–10.5)
nRBC: 0 % (ref 0.0–0.2)

## 2020-05-21 LAB — CREATININE, SERUM
Creatinine, Ser: 0.57 mg/dL (ref 0.44–1.00)
GFR, Estimated: >60 mL/min (ref >60)

## 2020-05-21 MED ORDER — OXYTOCIN-SODIUM CHLORIDE 30-0.9 UT/500ML-% IV SOLN: 2.5000 [IU]/h | INTRAVENOUS | Status: AC

## 2020-05-21 MED ORDER — DIBUCAINE (PERIANAL) 1 % EX OINT: 1.0000 "application " | TOPICAL_OINTMENT | CUTANEOUS | Status: DC | PRN

## 2020-05-21 MED ORDER — MEPERIDINE HCL 25 MG/ML IJ SOLN: 6.2500 mg | INTRAMUSCULAR | Status: DC | PRN

## 2020-05-21 MED ORDER — OXYCODONE HCL 5 MG PO TABS: 5.0000 mg | ORAL_TABLET | Freq: Once | ORAL | Status: DC | PRN

## 2020-05-21 MED ORDER — GABAPENTIN 100 MG PO CAPS
300.0000 mg | ORAL_CAPSULE | Freq: Two times a day (BID) | ORAL | Status: DC
  Administered 2020-05-21 – 2020-05-23 (×5): 300 mg via ORAL
  Filled 2020-05-21 (×5): qty 3

## 2020-05-21 MED ORDER — DIPHENHYDRAMINE HCL 25 MG PO CAPS: 25.0000 mg | ORAL_CAPSULE | Freq: Four times a day (QID) | ORAL | Status: DC | PRN

## 2020-05-21 MED ORDER — TRAMADOL HCL 50 MG PO TABS: 50.0000 mg | ORAL_TABLET | Freq: Four times a day (QID) | ORAL | Status: DC | PRN

## 2020-05-21 MED ORDER — ENOXAPARIN SODIUM 60 MG/0.6ML ~~LOC~~ SOLN
50.0000 mg | SUBCUTANEOUS | Status: DC
  Administered 2020-05-21 – 2020-05-22 (×2): 50 mg via SUBCUTANEOUS
  Filled 2020-05-21 (×2): qty 0.6

## 2020-05-21 MED ORDER — TETANUS-DIPHTH-ACELL PERTUSSIS 5-2.5-18.5 LF-MCG/0.5 IM SUSY: 0.5000 mL | PREFILLED_SYRINGE | Freq: Once | INTRAMUSCULAR | Status: DC

## 2020-05-21 MED ORDER — ENOXAPARIN SODIUM 40 MG/0.4ML ~~LOC~~ SOLN: 40.0000 mg | SUBCUTANEOUS | Status: DC

## 2020-05-21 MED ORDER — ACETAMINOPHEN 500 MG PO TABS
500.0000 mg | ORAL_TABLET | Freq: Four times a day (QID) | ORAL | Status: DC
  Administered 2020-05-21 – 2020-05-23 (×7): 500 mg via ORAL
  Filled 2020-05-21 (×8): qty 1

## 2020-05-21 MED ORDER — LEVETIRACETAM 500 MG PO TABS
1000.0000 mg | ORAL_TABLET | Freq: Two times a day (BID) | ORAL | Status: DC
  Administered 2020-05-21 – 2020-05-23 (×5): 1000 mg via ORAL
  Filled 2020-05-21 (×6): qty 2

## 2020-05-21 MED ORDER — KETOROLAC TROMETHAMINE 30 MG/ML IJ SOLN
INTRAMUSCULAR | Status: AC
  Filled 2020-05-21: qty 1

## 2020-05-21 MED ORDER — PRENATAL MULTIVITAMIN CH
1.0000 | ORAL_TABLET | Freq: Every day | ORAL | Status: DC
  Administered 2020-05-21 – 2020-05-23 (×3): 1 via ORAL
  Filled 2020-05-21 (×3): qty 1

## 2020-05-21 MED ORDER — LACTATED RINGERS IV SOLN: INTRAVENOUS | Status: DC

## 2020-05-21 MED ORDER — MENTHOL 3 MG MT LOZG: 1.0000 | LOZENGE | OROMUCOSAL | Status: DC | PRN

## 2020-05-21 MED ORDER — ACETAMINOPHEN 325 MG PO TABS: 325.0000 mg | ORAL_TABLET | ORAL | Status: DC | PRN

## 2020-05-21 MED ORDER — SIMETHICONE 80 MG PO CHEW
80.0000 mg | CHEWABLE_TABLET | ORAL | Status: DC | PRN
  Administered 2020-05-21 – 2020-05-22 (×2): 80 mg via ORAL

## 2020-05-21 MED ORDER — HYDROMORPHONE HCL 1 MG/ML IJ SOLN: 1.0000 mg | INTRAMUSCULAR | Status: DC | PRN

## 2020-05-21 MED ORDER — COCONUT OIL OIL
1.0000 "application " | TOPICAL_OIL | Status: DC | PRN
  Administered 2020-05-22: 1 via TOPICAL

## 2020-05-21 MED ORDER — KETOROLAC TROMETHAMINE 30 MG/ML IJ SOLN: 30.0000 mg | Freq: Four times a day (QID) | INTRAMUSCULAR | Status: DC | PRN

## 2020-05-21 MED ORDER — SENNOSIDES-DOCUSATE SODIUM 8.6-50 MG PO TABS
2.0000 | ORAL_TABLET | ORAL | Status: DC
  Administered 2020-05-21 – 2020-05-22 (×2): 2 via ORAL
  Filled 2020-05-21 (×3): qty 2

## 2020-05-21 MED ORDER — ZOLPIDEM TARTRATE 5 MG PO TABS: 5.0000 mg | ORAL_TABLET | Freq: Every evening | ORAL | Status: DC | PRN

## 2020-05-21 MED ORDER — SCOPOLAMINE 1 MG/3DAYS TD PT72
1.0000 | MEDICATED_PATCH | Freq: Once | TRANSDERMAL | Status: DC
  Administered 2020-05-21: 1.5 mg via TRANSDERMAL

## 2020-05-21 MED ORDER — KETOROLAC TROMETHAMINE 30 MG/ML IJ SOLN
30.0000 mg | Freq: Four times a day (QID) | INTRAMUSCULAR | Status: DC
  Filled 2020-05-21: qty 1

## 2020-05-21 MED ORDER — ONDANSETRON HCL 4 MG/2ML IJ SOLN: 4.0000 mg | Freq: Once | INTRAMUSCULAR | Status: DC | PRN

## 2020-05-21 MED ORDER — SCOPOLAMINE 1 MG/3DAYS TD PT72
MEDICATED_PATCH | TRANSDERMAL | Status: AC
  Filled 2020-05-21: qty 1

## 2020-05-21 MED ORDER — MEASLES, MUMPS & RUBELLA VAC IJ SOLR: 0.5000 mL | Freq: Once | INTRAMUSCULAR | Status: DC

## 2020-05-21 MED ORDER — OXYCODONE HCL 5 MG/5ML PO SOLN: 5.0000 mg | Freq: Once | ORAL | Status: DC | PRN

## 2020-05-21 MED ORDER — FERROUS SULFATE 325 (65 FE) MG PO TABS
325.0000 mg | ORAL_TABLET | Freq: Two times a day (BID) | ORAL | Status: DC
  Administered 2020-05-21 (×2): 325 mg via ORAL
  Filled 2020-05-21 (×3): qty 1

## 2020-05-21 MED ORDER — FENTANYL CITRATE (PF) 100 MCG/2ML IJ SOLN: 25.0000 ug | INTRAMUSCULAR | Status: DC | PRN

## 2020-05-21 MED ORDER — IBUPROFEN 800 MG PO TABS
800.0000 mg | ORAL_TABLET | Freq: Four times a day (QID) | ORAL | Status: DC
  Administered 2020-05-21: 800 mg via ORAL
  Filled 2020-05-21: qty 1

## 2020-05-21 MED ORDER — MAGNESIUM HYDROXIDE 400 MG/5ML PO SUSP: 30.0000 mL | ORAL | Status: DC | PRN

## 2020-05-21 MED ORDER — KETOROLAC TROMETHAMINE 30 MG/ML IJ SOLN
30.0000 mg | Freq: Four times a day (QID) | INTRAMUSCULAR | Status: DC | PRN
  Administered 2020-05-21: 30 mg via INTRAVENOUS

## 2020-05-21 MED ORDER — WITCH HAZEL-GLYCERIN EX PADS: 1.0000 "application " | MEDICATED_PAD | CUTANEOUS | Status: DC | PRN

## 2020-05-21 MED ORDER — IBUPROFEN 600 MG PO TABS
600.0000 mg | ORAL_TABLET | Freq: Four times a day (QID) | ORAL | Status: DC
  Administered 2020-05-21 – 2020-05-23 (×8): 600 mg via ORAL
  Filled 2020-05-21 (×8): qty 1

## 2020-05-21 MED ORDER — OXYCODONE-ACETAMINOPHEN 5-325 MG PO TABS
2.0000 | ORAL_TABLET | ORAL | Status: DC | PRN
  Administered 2020-05-21 – 2020-05-22 (×4): 2 via ORAL
  Filled 2020-05-21 (×5): qty 2

## 2020-05-21 MED ORDER — SIMETHICONE 80 MG PO CHEW
80.0000 mg | CHEWABLE_TABLET | ORAL | Status: DC
  Administered 2020-05-22 – 2020-05-23 (×2): 80 mg via ORAL
  Filled 2020-05-21 (×3): qty 1

## 2020-05-21 MED ORDER — OXYCODONE HCL 5 MG PO TABS
5.0000 mg | ORAL_TABLET | ORAL | Status: DC | PRN
  Administered 2020-05-22: 5 mg via ORAL
  Administered 2020-05-22 – 2020-05-23 (×2): 10 mg via ORAL
  Filled 2020-05-21: qty 2
  Filled 2020-05-21: qty 1
  Filled 2020-05-21: qty 2

## 2020-05-21 MED ORDER — ACETAMINOPHEN 160 MG/5ML PO SOLN: 325.0000 mg | ORAL | Status: DC | PRN

## 2020-05-21 NOTE — Lactation Note (Signed)
This note was copied from a baby's chart. Lactation Consultation Note  Patient Name: Brittany Hendrix [id number] Date: 05/21/2020 Reason for consult: Follow-up assessment   LC Follow Up Visit:  Attempted to visit with mother, however, she was asleep.  No support person present at this time; will have LC round later today.   Maternal Data    Feeding    LATCH Score                   Interventions    Lactation Tools Discussed/Used     Consult Status Consult Status: Follow-up Date: 05/22/20 Follow-up type: In-patient    [name] R [name] 05/21/2020, 1:58 PM

## 2020-05-21 NOTE — Lactation Note (Signed)
This note was copied from a baby's chart. Lactation Consultation Note Baby 3 hrs old. Mom stated latched in L&D. Time for feeding, LC stimulated baby to try to BF. Placed on mom STS. Baby had no interest in BF at this time.  Mom wanted to hold baby in her arms. LC swaddled baby. Newborn feeding habits, behavior, STS, I&O, milk storage, breast massage, supply and demand discussed.  Mom encouraged to feed baby 8-12 times/24 hours and with feeding cues. Mom encouraged to waken baby for feeds if hasn't cued in 3 hrs.   Mom has 76 yr old that she only BF for 2 weeks. Mom stated she will see how it goes on how long she BF this baby.  Mom has wide spaced breast, everted nipples. Hand expression taught w/dots of colostrum noted.  Encouraged mom to call for assistance or questions. Lactation brochure given.  Patient Name: Brittany Hendrix [id number] Date: 05/21/2020 Reason for consult: Initial assessment;Term   Maternal Data Has patient been taught Hand Expression?: Yes Does the patient have breastfeeding experience prior to this delivery?: Yes  Feeding Feeding Type: Breast Fed  LATCH Score Latch: Too sleepy or reluctant, no latch achieved, no sucking elicited.  Audible Swallowing: None  Type of Nipple: Everted at rest and after stimulation  Comfort (Breast/Nipple): Soft / non-tender  Hold (Positioning): Full assist, staff holds infant at breast  LATCH Score: 4  Interventions Interventions: Breast feeding basics reviewed;Support pillows;Assisted with latch;Position options;Skin to skin;Expressed milk;Breast massage;Hand express;Breast compression;Adjust position  Lactation Tools Discussed/Used WIC Program: Yes   Consult Status Consult Status: Follow-up Date: 05/21/20 Follow-up type: In-patient    Charyl Dancer 05/21/2020, 2:33 AM

## 2020-05-21 NOTE — Progress Notes (Signed)
POSTPARTUM PROGRESS NOTE  Subjective: Brittany Hendrix is a 29 y.o. [id number] s/p pLTCS at [redacted]w[redacted]d .  She reports she doing well. No acute events overnight. Still has foley in place, has not yet ambulated. No issues with po intake. Denies nausea or vomiting. She has not passed flatus. Pain is well controlled.  Lochia is moderate.  Objective: Blood pressure 113/66, pulse (!) 53, temperature 97.9 F (36.6 C), temperature source Oral, resp. rate 18, last menstrual period 08/16/2019, SpO2 98 %, unknown if currently breastfeeding.  Physical Exam:  General: alert, cooperative and no distress Chest: no respiratory distress Abdomen: soft, non-tender  Uterine Fundus: firm and at level of umbilicus. Honeycomb is saturated. This was removed and incision was c/d/i without active bleeding. (Replaced at bedside and pressure dressing placed.)  Extremities: No calf swelling or tenderness  no edema  Recent Labs    05/20/20 0833 05/21/20 0626  HGB 10.7* 11.8*  HCT 32.1* 34.9*    Assessment/Plan: Brittany Hendrix is a 29 y.o. [id number] s/p pLTCS at [redacted]w[redacted]d for malpresentation. POD#1.  Routine Postpartum Care: Doing well, pain well-controlled.  -- Continue routine care, lactation support  -- Hgb 10.7 > 11.8 -- Contraception: discussed at bedside, declines.  -- Feeding: breast  -- Desires circumcision. Consented at bedside.   -History of epilepsy: continued PTA keppra 1000mg  BID     Dispo: Plan for discharge POD#2-3.  , MD OB Fellow, [facility] 05/21/2020 7:25 AM

## 2020-05-21 NOTE — Progress Notes (Signed)
Honeycomb dressing changed with provider, new pressure dressing applied.

## 2020-05-21 NOTE — Anesthesia Postprocedure Evaluation (Signed)
Anesthesia Post Note  Patient: Brittany Hendrix  Procedure(s) Performed: CESAREAN SECTION (N/A )     Patient location during evaluation: PACU Anesthesia Type: Spinal Level of consciousness: oriented and awake and alert Pain management: pain level controlled Vital Signs Assessment: post-procedure vital signs reviewed and stable Respiratory status: spontaneous breathing, respiratory function stable and patient connected to nasal cannula oxygen Cardiovascular status: blood pressure returned to baseline and stable Postop Assessment: no headache, no backache and no apparent nausea or vomiting Anesthetic complications: no   No complications documented.  Last Vitals:  Vitals:   05/21/20 1030 05/21/20 1435  BP: (!) 89/52 (!) 80/61  Pulse: (!) 54 (!) 47  Resp: 18 18  Temp: 36.4 C 36.5 C  SpO2: 97% 100%    Last Pain:  Vitals:   05/21/20 1435  TempSrc: Oral  PainSc:    Pain Goal:                   Marston Mccadden

## 2020-05-21 NOTE — Lactation Note (Signed)
This note was copied from a baby's chart. Lactation Consultation Note  Patient Name: Brittany Hendrix [id number] Date: 05/21/2020 Reason for consult: Follow-up assessment  Follow-up visit to 18-hours infant. Mother is holding infant upon arrival. Mother states infant has not fed since 1300. Observed infant cueing. Offered assistance with latch. Mother attempted latch to left breast cradle position. Infant is swaddled and falls sleep with nipple in mouth.  Encouraged mother to undress infant before attempting latch. Talked about football position. Mother agreed to try, set up support pillows for left breast. Demonstrated alignment and hand expression. Colostrum is easily expressed. Latched infant after a couple of attempts. Noted suckling and swallowing. Mother denies discomfort or pain.  Educated mom about deep latch for an optimal breastfeeding session. Infant had a stool and mother changed diaper.  Plan:   1-Breastfeeding on demand, ensuring a deep, comfortable latch.  2-Undressing infant and place skin to skin when ready to breastfeed 3-Keep infant awake during breastfeeding session: massaging breast, infant's hand/shoulder/feet 4-Monitor voids and stools as signs good intake.  5-Contact LC as needed for feeds/support/concerns/questions   Maternal Data Has patient been taught Hand Expression?: Yes  Feeding Feeding Type: Breast Fed  LATCH Score Latch: Grasps breast easily, tongue down, lips flanged, rhythmical sucking.  Audible Swallowing: Spontaneous and intermittent  Type of Nipple: Everted at rest and after stimulation  Comfort (Breast/Nipple): Soft / non-tender  Hold (Positioning): Assistance needed to correctly position infant at breast and maintain latch.  LATCH Score: 9  Interventions Interventions: Breast feeding basics reviewed;Assisted with latch;Hand express;Adjust position;Support pillows;Position options  Lactation Tools Discussed/Used     Consult  Status Consult Status: Follow-up Date: 05/22/20 Follow-up type: In-patient    Brittany Hendrix 05/21/2020, 6:02 PM

## 2020-05-22 ENCOUNTER — Encounter (HOSPITAL_COMMUNITY): Payer: Self-pay | Admitting: Obstetrics & Gynecology

## 2020-05-22 NOTE — Progress Notes (Addendum)
POSTPARTUM PROGRESS NOTE  Subjective: Brittany Hendrix is a 29 y.o. [phone] currently POD#2 s/p pLTCS at [redacted]w[redacted]d.  She reports she doing well. No acute events overnight. She denies any problems with ambulating, voiding or po intake. Denies nausea or vomiting. She has passed flatus but no BM yet. Pain is well controlled.  Lochia is decreasing and without large clots.  Objective: Blood pressure (!) 87/51, pulse (!) 51, temperature 97.7 F (36.5 C), resp. rate 16, height 5\' 4"  (1.626 m), weight 93.1 kg, last menstrual period 08/16/2019, SpO2 98 %, unknown if currently breastfeeding.  Physical Exam:  General: alert, cooperative and no distress Chest: no respiratory distress Abdomen: soft, non-tender  Uterine Fundus: firm and at level of umbilicus, honeycomb saturated and removed. Incision site without active bleeding, no discharge, redness, or erythema noted.  Extremities: No calf swelling or tenderness  no edema  Recent Labs    05/20/20 0833 05/21/20 0626  HGB 10.7* 11.8*  HCT 32.1* 34.9*    Assessment/Plan: Brittany Hendrix is a 29 y.o. 26 currently POD#2 s/p pLTCS at [redacted]w[redacted]d.  Routine Postpartum Care: Doing well, pain well-controlled.  -- Incision site: This morning required honeycomb dressing change #2 for blood on dressing, believed to be due to excessive activity this soon after operation. Incision site was visualized during this time and found to be well-approximated without active bleeding, discharge, erythema, or swelling. Honeycomb replaced w/o issue.   -- Blood pressure: BP 90/50's this morning without concurrent tachycardia, believed to be Brittany Hendrix's baseline. She is completely asymptomatic. Will continue to monitor. Hgb stable.  -- Continue routine care, lactation support  -- Contraception: declines -- Feeding: breast and bottle  Dispo: Plan for discharge POD #3 (Oct. 31).  03-10-1972, MD 05/22/2020 6:59 AM  I saw and evaluated the patient. I agree with the  findings and the plan of care as documented in the resident's note.  05/24/2020, MD [facility] Family Medicine Fellow, [facility] for [facility], [facility]

## 2020-05-22 NOTE — Lactation Note (Addendum)
This note was copied from a Brittany's chart. Lactation Consultation Note  Patient Name: Brittany Hendrix [id number] Date: 05/22/2020  Mom reports he has been pretty sleepy since his circumcision today.  Brittany Hendrix now 45 hours old. Urged mom to hand express and spoon feed past bf and/or attempt until starts feeling breasts full/heavy.  Urged to feed on cue and 8-12 or more times day.   Discussed starting  pumping past bf with DEBP Since infant had that long period without eating today.     Brittany Hendrix 05/22/2020, 7:31 PM

## 2020-05-23 MED ORDER — IBUPROFEN 600 MG PO TABS: 600.0000 mg | ORAL_TABLET | Freq: Four times a day (QID) | ORAL | 0 refills | Status: DC

## 2020-05-23 MED ORDER — ACETAMINOPHEN 500 MG PO TABS
500.0000 mg | ORAL_TABLET | Freq: Four times a day (QID) | ORAL | 0 refills | Status: DC | PRN
Start: 2020-05-23 — End: 2020-12-23

## 2020-05-23 MED ORDER — COCONUT OIL OIL
1.0000 | TOPICAL_OIL | 0 refills | Status: DC | PRN
Start: 2020-05-23 — End: 2020-12-23

## 2020-05-23 NOTE — Progress Notes (Signed)
Notified MD of pt heart rate of 48 this morning. Aware pt is feeling okay and has no complaints otherwise. No new orders at this time.

## 2020-05-23 NOTE — Lactation Note (Signed)
This note was copied from a baby's chart. Lactation Consultation Note  Patient Name: Brittany Hendrix MEQAS'[id number] Date: 05/23/2020   [name] Brittany Hendrix now 36 hours old with 9 percent weight loss.  Mom reports he took formula and then breastfed at 6 am and then just took a sip of formula right before LC came in.  Discussed initiating pumping with mom.  Mom in agreement even though they are being d/c today. Dicussed offering the breast first since he only took a sip of formula.  Mom agreed.  Assisted with latching on left breast.  Mom reports comfort.  Infant struggled to latch at first.  Inquired about pacifier.  Mom reports they started one yesterday.  Urged to d/c pacifier due to weight loss.  Mom has wide spaced breasts.  Mom reports no breast changes during pregnancy.  Mom reports they are starting to get a little lumpy.   When he was latched on left breast/right breast started leaking.  LC used colostrum container to catch colostrum drops. Infant breastfed on left breast.  Let go and came off and assisted mom in getting him on the right breast. Infant latched on right breast and let go asleep.  Hand expressed about 5 ml of breastmilk.  Showed dad how to spoon feed to infant.  Infant ate it and was still cuing.  Showed parents how to paced bottle feed.  He took close to 10 ml of formula via bottle.  Urged to feed every 2-3 hours due to weight loss or earlier if he wants to/if he cues.  Gave supplement amount sheet.  Discussed WIC loaner breastpump. Discussed with parents it is not an all or nothing thing that any breastfeeding or breastmilk is good. Sent WIC referral due to infant weight loss.  Demo hand expression. Massage. Manual breastpump.  Inittiated pumping withDEBP.  Mom got a few drops on the right breast but about 4 ml with pumping on the left.  Urged her to save and feed after next breastfeeding along with formula . Urged her to always offer breast first.  Urged to call lactation as  needed.  Maternal Data    Feeding    LATCH Score                   Interventions    Lactation Tools Discussed/Used     Consult Status      Abas Leicht [name] 05/23/2020, 9:27 AM

## 2020-05-23 NOTE — Discharge Instructions (Signed)

## 2020-05-26 ENCOUNTER — Telehealth: Payer: Self-pay

## 2020-05-26 NOTE — Telephone Encounter (Signed)
Return call to patient regarding message left in Mychart today.  Message was about  upcoming appt being in person or virtual and requesting note for FOB who has been out of work with pt since delivery.  The hospital did provide a note for dates of 05/20/20-05/23/20. Pt states she had a seizure on 05/23/20 at night EMS was called and she declined to go to the hospital that night. Pt states she has not had another seizure episode, and is taking medication Keppra as directed  no headaches, no visual changes , no swelling. Pt made aware I would need to consult with a provider regarding her situation. Consulted with Faculty MD in office advised pt should consult with Neurologist and let them know of recent seizure.  Report to hospital if she has another seizure , however a note for FOB cannot be provided being we have no clinical  documentation and he should return to work or reach out to  employer for FMLA  Pt made aware and voiced understanding.

## 2020-06-01 ENCOUNTER — Ambulatory Visit (INDEPENDENT_AMBULATORY_CARE_PROVIDER_SITE_OTHER): Payer: Medicaid Other

## 2020-06-01 ENCOUNTER — Other Ambulatory Visit: Payer: Self-pay

## 2020-06-01 VITALS — BP 109/58 | HR 64

## 2020-06-01 DIAGNOSIS — Z4889 Encounter for other specified surgical aftercare: Secondary | ICD-10-CM

## 2020-06-01 NOTE — Progress Notes (Signed)
The wound is cleansed, debrided of foreign material as much as possible, and dressed. The patient is alerted to watch for any signs of infection (redness, pus, pain, increased swelling or fever) and call if such occurs. Home wound care instructions are provided.   Patient denies having any pain, redness, swelling, odor, or drainage.  Honeycomb dressing removed. Wound appear to be clean, dry and intact. No swelling, drainage, odor, or redness noted.  Patient informed that she can shower and clean the area gently with mild soap and water. Advised to continue to monitor for signs of infection and when to contact the office.  Patient verbalized understanding.

## 2020-06-01 NOTE — Progress Notes (Signed)
Patient was assessed and managed by nursing staff during this encounter. I have reviewed the chart and agree with the documentation and plan. I have also made any necessary editorial changes.  Catalina Antigua, MD 06/01/2020 2:58 PM

## 2020-06-15 NOTE — Progress Notes (Signed)
I have reviewed and agreed above plan. 

## 2020-06-23 ENCOUNTER — Ambulatory Visit (INDEPENDENT_AMBULATORY_CARE_PROVIDER_SITE_OTHER): Payer: Medicaid Other | Admitting: Obstetrics and Gynecology

## 2020-06-23 ENCOUNTER — Encounter: Payer: Self-pay | Admitting: Obstetrics and Gynecology

## 2020-06-23 ENCOUNTER — Other Ambulatory Visit: Payer: Self-pay

## 2020-06-23 LAB — POCT URINE PREGNANCY: Preg Test, Ur: NEGATIVE

## 2020-06-23 NOTE — Progress Notes (Signed)
    Post Partum Visit Note  Brittany Hendrix is a 29 y.o. [phone] female who presents for a postpartum visit. She is 4 weeks postpartum following a primary cesarean section.  I have fully reviewed the prenatal and intrapartum course. The delivery was at 39 gestational weeks.  Anesthesia: spinal. Postpartum course has been Unremarkable. Baby is doing well. Baby is feeding by bottle - Gerber gentle . Bleeding no bleeding. Bowel function is normal. Bladder function is normal. Patient is sexually active. Contraception method is none. Pt had unprotected intercourse last night and prior.  Postpartum depression screening: negative.   The pregnancy intention screening data noted above was reviewed. Potential methods of contraception were discussed. The patient elected to proceed with Female Condom.      The following portions of the patient's history were reviewed and updated as appropriate: allergies, current medications, past family history, past medical history, past social history, past surgical history and problem list.  Review of Systems A comprehensive review of systems was negative.    Objective:  LMP 08/16/2019    General:  alert and cooperative   Breasts:  inspection negative, no nipple discharge or bleeding, no masses or nodularity palpable  Lungs: clear to auscultation bilaterally  Heart:  regular rate and rhythm, S1, S2 normal, no murmur, click, rub or gallop  Abdomen: soft, non-tender; bowel sounds normal; no masses,  no organomegaly Incision:  Clean, dry, and intact   Vulva:  not evaluated  Vagina: not evaluated  Cervix:  not evaluated  Corpus: not examined  Adnexa:  not evaluated  Rectal Exam: Not performed.        Assessment:    Normal postpartum exam. Pap smear not done at today's visit.   Plan:   Essential components of care per ACOG recommendations:  1.  Mood and well being: Patient with negative depression screening today. Reviewed local resources for support.  -  Patient does not use tobacco. - hx of drug use? No    2. Infant care and feeding:  -Patient currently breastmilk feeding? No  -Social determinants of health (SDOH) reviewed in EPIC. No concerns  3. Sexuality, contraception and birth spacing - Patient does not want a pregnancy in the next year.  Desired family size is unknown number of children.  - Reviewed forms of contraception in tiered fashion. Patient desired condoms today.   - Discussed birth spacing of 18 months  4. Sleep and fatigue -Encouraged family/partner/community support of 4 hrs of uninterrupted sleep to help with mood and fatigue  5. Physical Recovery  - Discussed patients delivery  - Patient has urinary incontinence? No  - Patient is safe to resume physical and sexual activity  6.  Health Maintenance - Last pap smear done 11/07/19 and was normal with negative HPV. N/A Mammogram  7. Seizure disorder -Chronic Disease - Neurolorgy follow up already scheduled for January 2022  Darrin Nipper. Gerri Spore, MD  Center for [employer], [facility]

## 2020-08-11 ENCOUNTER — Encounter: Payer: Self-pay | Admitting: Neurology

## 2020-08-11 ENCOUNTER — Ambulatory Visit: Payer: Medicaid Other | Admitting: Neurology

## 2020-08-11 NOTE — Progress Notes (Deleted)
PATIENT: Brittany Hendrix DOB: 31-Oct-1990  REASON FOR VISIT: follow up HISTORY FROM: patient  HISTORY OF PRESENT ILLNESS: Today 08/11/20  HISTORY Brittany B Martinezis a 30 years old female, with epilepsy disorder, currently taking Keppra 500 in the morning, 1000 mg in the evening. Last seizure was in May 2014.   She had seizures since she was 34-30 years old. All seizure has similar seminology. She has an aura before those seizures, she feels drowsy and sometimes nauseated, lasting few minutes, followed by loss of conciousness, whole body shaking, then post ictal confusion usually last 20-30 minutes..   She has injured herself in the face and on her leg during seizures in the past. She wasnot treated withanti-epilepsicmedications, until after she had a recurrent seizure in May 2012, she was put on Levetiracetam 500 mg b.i.d.   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 her bloodwork tested positive for marijuana.   For a while, she was noncompliant with her medications, also concerned about the medication costs, she continue have recurrent seizures.   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 EEG was normal  UPDATE June 5th 2015: Last visit was in May 2014, she has two recurrent seizure 2 weeks ago, precede by dizziness, fainting sensation, then body jerking movement with LOC, she has two seizures in one day, while taking keppra 500mg  bid, compliance with her medication  UPDATE July 28th 2016: She is now taking Keppra 500 mg in the morning, 1000 mg at evening, no recurrent seizures, she work as a 09-14-2004, pay medicine out of her pocket, often times, it is a struggle  UPDATE April 27th 2017: She had one recurrent seizure at Feb 23rd 2017 when she was [redacted] week pregnant,  precede by intermittent body jumping, she has been compliant with her Keppra 500 mg in the morning, 1000 mg at night, she is pregnant with her first son, expected due date is June first 2017, getting keppra filled at 02-11-1983.  I reviewed laboratory, normal CMP, mild anemia 10.8,  UPDATE Sept 24 2018: Most recent seizure was on Sept 10 2018, she has aura of body jumpy, body nervous shaky, she gasp for air, LOC, she bite her lips followed by headache, body achy pain  UPDATE Nov 25 2019: She still works as a 02-14-1971 at [employer], currently pregnant witha boy, she had a healthy delivery in 2017 while taking Keppra 1500 mg daily, reported last seizure was May 24, 2020 following her hollowing party, she was sleep deprived, also had excessive alcohol intake, she has been compliant with her medications  Laboratory evaluation in 2021,A1c was 5.0,negative hepatitis C, HIV, normal CBC, rheumatoid factor,  Update April 29, 2020 SS: In May 2021, Keppra level was normal 26.9, TSH low end normal.  During virtual visit today, tested positive for Covid 9/25, is [redacted] weeks pregnant, Covid symptoms are mild, so far pregnancy going well.  No recurrent seizure.  Remains on Keppra 1000 mg twice a day.  EEG ordered at last visit, missed appointment.   Update August 11, 2020 SS: Had C-section on 05/20/2020  REVIEW OF SYSTEMS: Out of a complete 14 system review of symptoms, the patient complains only of the following symptoms, and all other reviewed systems are negative.  ALLERGIES: No Known Allergies  HOME MEDICATIONS: Outpatient  Medications Prior to Visit  Medication Sig Dispense Refill  . acetaminophen (TYLENOL) 500 MG tablet Take 1 tablet (500 mg total) by mouth every 6 (six) hours as needed. 30 tablet 0  . coconut oil OIL Apply 1 application topically as needed.  0  . ibuprofen (ADVIL) 600 MG tablet Take 1 tablet (600 mg total) by mouth every 6 (six) hours. 30 tablet 0   No  facility-administered medications prior to visit.    PAST MEDICAL HISTORY: Past Medical History:  Diagnosis Date  . Abortion history   . Headache(784.0)   . Pregnant   . Seizures [facility])    Last seizure March 2020  . Supervision of high-risk pregnancy 05/28/2015    Clinic HRC Prenatal Labs  Dating 5 week scan Blood type:   A pos  Genetic Screen Normal NT/NB 1 Screen:        AFP:     Antibody: neg  Anatomic [place]  Rubella:  immune  GTT Early: 124          Third trimester:  RPR:   NR  Flu vaccine Declined HBsAg:   neg  TDaP vaccine                                           HIV: Non Reactive (09/26 0020)   Baby Food Breast                                            PAST SURGICAL HISTORY: Past Surgical History:  Procedure Laterality Date  . CESAREAN SECTION N/A 05/20/2020   Procedure: CESAREAN SECTION;  Surgeon: Tereso Newcomer, MD;  Location: [facility] LD ORS;  Service: Obstetrics;  Laterality: N/A;  . DILATION AND CURETTAGE OF UTERUS     abortion  . DILATION AND EVACUATION N/A 10/04/2017   Procedure: DILATATION AND EVACUATION;  Surgeon: Silverio Lay, MD;  Location: [facility] ORS;  Service: Gynecology;  Laterality: N/A;    FAMILY HISTORY: Family History  Problem Relation Age of Onset  . Diabetes Mother   . Asthma Brother     SOCIAL HISTORY: Social History   Socioeconomic History  . Marital status: Single    Spouse name: Not on file  . Number of children: 0  . Years of education: 53  . Highest education level: Not on file  Occupational History  . Occupation: Lobbyist: Production manager    Comment: [employer]  Tobacco Use  . Smoking status: Current Every Day Smoker    Packs/day: 0.50    Years: 5.00    Pack years: 2.50    Types: Cigarettes  . Smokeless tobacco: Never Used  Vaping Use  . Vaping Use: Never used  Substance and Sexual Activity  . Alcohol use: Not Currently    Comment: occasional, not while preg  . Drug use: Yes    Types: Marijuana    Comment: last used last  week  . Sexual activity: Yes    Partners: Male    Birth control/protection: None  Other Topics Concern  . Not on file  Social History Narrative   Patient is single and lives at home alone. Patient works at [employer]. Right handed. Caffeine three daily.   Social Determinants of Corporate investment banker  Strain: Not on file  Food Insecurity: Not on file  Transportation Needs: Not on file  Physical Activity: Not on file  Stress: Not on file  Social Connections: Not on file  Intimate Partner Violence: Not on file      PHYSICAL EXAM  There were no vitals filed for this visit. There is no height or weight on file to calculate BMI.  Generalized: Well developed, in no acute distress   Neurological examination  Mentation: Alert oriented to time, place, history taking. Follows all commands speech and language fluent Cranial nerve II-XII: Pupils were equal round reactive to light. Extraocular movements were full, visual field were full on confrontational test. Facial sensation and strength were normal. Uvula tongue midline. Head turning and shoulder shrug  were normal and symmetric. Motor: The motor testing reveals 5 over 5 strength of all 4 extremities. Good symmetric motor tone is noted throughout.  Sensory: Sensory testing is intact to soft touch on all 4 extremities. No evidence of extinction is noted.  Coordination: Cerebellar testing reveals good finger-nose-finger and heel-to-shin bilaterally.  Gait and station: Gait is normal. Tandem gait is normal. Romberg is negative. No drift is seen.  Reflexes: Deep tendon reflexes are symmetric and normal bilaterally.   DIAGNOSTIC DATA (LABS, IMAGING, TESTING) - I reviewed patient records, labs, notes, testing and imaging myself where available.  Lab Results  Component Value Date   WBC 16.5 (H) 05/21/2020   HGB 11.8 (L) 05/21/2020   HCT 34.9 (L) 05/21/2020   MCV 96.7 05/21/2020   PLT 247 05/21/2020      Component Value  Date/Time   NA 137 10/07/2019 2317   K 3.6 10/07/2019 2317   CL 105 10/07/2019 2317   CO2 23 10/07/2019 2317   GLUCOSE 94 10/07/2019 2317   BUN 10 10/07/2019 2317   CREATININE 0.57 05/21/2020 0626   CALCIUM 9.0 10/07/2019 2317   PROT 6.5 10/07/2019 2317   ALBUMIN 3.7 10/07/2019 2317   AST 16 10/07/2019 2317   ALT 19 10/07/2019 2317   ALKPHOS 53 10/07/2019 2317   BILITOT 0.5 10/07/2019 2317   GFRNONAA >60 05/21/2020 0626   GFRAA >60 10/07/2019 2317   No results found for: CHOL, HDL, LDLCALC, LDLDIRECT, TRIG, CHOLHDL Lab Results  Component Value Date   HGBA1C 5.0 11/07/2019   No results found for: VITAMINB12 Lab Results  Component Value Date   TSH 0.541 11/25/2019      ASSESSMENT AND PLAN 30 y.o. year old female  has a past medical history of Abortion history, Headache(784.0), Pregnant, Seizures (HCC), and Supervision of high-risk pregnancy (05/28/2015). here with:  1.  Epilepsy -C-section delivery 05/20/2020 -Continue Keppra 1000 mg twice daily -No recurrent seizures   I spent 20 minutes of face-to-face and non-face-to-face time with patient.  This included previsit chart review, lab review, study review, order entry, electronic health record documentation, patient education.    Margie Ege, AGNP-C, DNP 08/11/2020, 6:21 AM [facility] [address] [place], [place] [address] [phone]

## 2020-11-10 ENCOUNTER — Encounter: Payer: Self-pay | Admitting: Obstetrics and Gynecology

## 2020-11-10 ENCOUNTER — Other Ambulatory Visit: Payer: Self-pay

## 2020-11-10 ENCOUNTER — Ambulatory Visit (INDEPENDENT_AMBULATORY_CARE_PROVIDER_SITE_OTHER): Payer: Medicaid Other | Admitting: *Deleted

## 2020-11-10 VITALS — BP 104/66

## 2020-11-10 DIAGNOSIS — N926 Irregular menstruation, unspecified: Secondary | ICD-10-CM

## 2020-11-10 LAB — POCT URINE PREGNANCY: Preg Test, Ur: POSITIVE — AB

## 2020-11-10 NOTE — Progress Notes (Signed)
Brittany Hendrix presents today for UPT. She has no unusual complaints.  LMP:09/30/20    OBJECTIVE: Appears well, in no apparent distress.  OB History    Gravida  7   Para  2   Term  2   Preterm  0   AB  5   Living  2     SAB  4   IAB  1   Ectopic  0   Multiple  0   Live Births  2          Home UPT Result:positive In-Office UPT result:positive  I have reviewed the patient's allergies and medications.   ASSESSMENT: Positive pregnancy test  PLAN Prenatal care to be completed at: [facility]

## 2020-11-17 NOTE — Progress Notes (Signed)
Patient was assessed and managed by nursing staff during this encounter. I have reviewed the chart and agree with the documentation and plan. I have also made any necessary editorial changes.  [place] Bing, MD 11/17/2020 11:57 AM

## 2020-11-23 ENCOUNTER — Other Ambulatory Visit: Payer: Self-pay

## 2020-11-23 ENCOUNTER — Inpatient Hospital Stay (HOSPITAL_COMMUNITY)
Admission: AD | Admit: 2020-11-23 | Discharge: 2020-11-23 | Disposition: A | Discharge status: Home / Self Care | Payer: Medicaid Other | Attending: Family Medicine | Admitting: Family Medicine

## 2020-11-23 ENCOUNTER — Inpatient Hospital Stay (HOSPITAL_COMMUNITY): Payer: Medicaid Other

## 2020-11-23 ENCOUNTER — Encounter (HOSPITAL_COMMUNITY): Payer: Self-pay

## 2020-11-23 DIAGNOSIS — O99331 Smoking (tobacco) complicating pregnancy, first trimester: Secondary | ICD-10-CM | POA: Insufficient documentation

## 2020-11-23 DIAGNOSIS — N939 Abnormal uterine and vaginal bleeding, unspecified: Secondary | ICD-10-CM | POA: Diagnosis present

## 2020-11-23 DIAGNOSIS — O30041 Twin pregnancy, dichorionic/diamniotic, first trimester: Secondary | ICD-10-CM | POA: Diagnosis not present

## 2020-11-23 DIAGNOSIS — O99321 Drug use complicating pregnancy, first trimester: Secondary | ICD-10-CM | POA: Diagnosis not present

## 2020-11-23 DIAGNOSIS — F1290 Cannabis use, unspecified, uncomplicated: Secondary | ICD-10-CM | POA: Insufficient documentation

## 2020-11-23 DIAGNOSIS — O021 Missed abortion: Secondary | ICD-10-CM | POA: Diagnosis not present

## 2020-11-23 DIAGNOSIS — Z3A01 Less than 8 weeks gestation of pregnancy: Secondary | ICD-10-CM | POA: Diagnosis not present

## 2020-11-23 DIAGNOSIS — F17210 Nicotine dependence, cigarettes, uncomplicated: Secondary | ICD-10-CM | POA: Insufficient documentation

## 2020-11-23 LAB — CBC
HCT: 35.1 % — ABNORMAL LOW (ref 36.0–46.0)
Hemoglobin: 11.7 g/dL — ABNORMAL LOW (ref 12.0–15.0)
MCH: 31.8 pg (ref 26.0–34.0)
MCHC: 33.3 g/dL (ref 30.0–36.0)
MCV: 95.4 fL (ref 80.0–100.0)
Platelets: 245 10*3/uL (ref 150–400)
RBC: 3.68 MIL/uL — ABNORMAL LOW (ref 3.87–5.11)
RDW: 12.1 % (ref 11.5–15.5)
WBC: 7.5 10*3/uL (ref 4.0–10.5)
nRBC: 0 % (ref 0.0–0.2)

## 2020-11-23 LAB — HCG, QUANTITATIVE, PREGNANCY: hCG, Beta Chain, Quant, S: 50360 m[IU]/mL — ABNORMAL HIGH (ref <5)

## 2020-11-23 LAB — URINALYSIS, ROUTINE W REFLEX MICROSCOPIC
Bilirubin Urine: NEGATIVE
Glucose, UA: NEGATIVE mg/dL
Ketones, ur: 5 mg/dL — AB
Leukocytes,Ua: NEGATIVE
Nitrite: NEGATIVE
Protein, ur: NEGATIVE mg/dL
RBC / HPF: >50 RBC/hpf — ABNORMAL HIGH (ref 0–5)
Specific Gravity, Urine: 1.008 (ref 1.005–1.030)
pH: 8 (ref 5.0–8.0)

## 2020-11-23 MED ORDER — ACETAMINOPHEN 325 MG PO TABS
650.0000 mg | ORAL_TABLET | Freq: Once | ORAL | Status: AC
  Administered 2020-11-23: 650 mg via ORAL
  Filled 2020-11-23: qty 2

## 2020-11-23 MED ORDER — MISOPROSTOL 200 MCG PO TABS: ORAL_TABLET | ORAL | 1 refills | Status: DC

## 2020-11-23 MED ORDER — OXYCODONE-ACETAMINOPHEN 5-325 MG PO TABS: 1.0000 | ORAL_TABLET | ORAL | 0 refills | Status: DC | PRN

## 2020-11-23 NOTE — Discharge Instructions (Signed)
Place 4 tablets of cytotec between cheek and gum and allow to dissolve.

## 2020-11-23 NOTE — MAU Provider Note (Signed)
History     CSN: [id number]  Arrival date and time: 11/23/20 1149   Event Date/Time   First Provider Initiated Contact with Patient 11/23/20 1239      Chief Complaint  Patient presents with  . Abdominal Pain  . Vaginal Bleeding   HPI This is a 30 yo G8P2052 at [redacted]w[redacted]d by LMP. Had large amount of vaginal bleeding about 1 hour ago. No palliating or provoking factors. Passing blood clots. Has mild headache. Some mild abdominal pain. Has not seen any tissue in the clots.  OB History    Gravida  8   Para  2   Term  2   Preterm  0   AB  5   Living  2     SAB  4   IAB  1   Ectopic  0   Multiple  0   Live Births  2           Past Medical History:  Diagnosis Date  . Abortion history   . Headache(784.0)   . Pregnant   . Seizures [facility])    Last seizure March 2020  . Supervision of high-risk pregnancy 05/28/2015    Clinic [facility] Prenatal Labs  Dating 5 week scan Blood type:   A pos  Genetic Screen Normal NT/NB 1 Screen:        AFP:     Antibody: neg  Anatomic [place]  Rubella:  immune  GTT Early: 124          Third trimester:  RPR:   NR  Flu vaccine Declined HBsAg:   neg  TDaP vaccine                                           HIV: Non Reactive (09/26 0020)   Baby Food Breast                                            Past Surgical History:  Procedure Laterality Date  . CESAREAN SECTION N/A 05/20/2020   Procedure: CESAREAN SECTION;  Surgeon: Tereso Newcomer, MD;  Location: [facility] LD [facility]S;  Service: Obstetrics;  Laterality: N/A;  . DILATION AND CURETTAGE OF UTERUS     abortion  . DILATION AND EVACUATION N/A 10/04/2017   Procedure: DILATATION AND EVACUATION;  Surgeon: Silverio Lay, MD;  Location: [facility] ORS;  Service: Gynecology;  Laterality: N/A;    Family History  Problem Relation Age of Onset  . Diabetes Mother   . Asthma Brother     Social History   Tobacco Use  . Smoking status: Current Every Day Smoker    Packs/day: 0.50    Years: 5.00    Pack years: 2.50     Types: Cigarettes  . Smokeless tobacco: Never Used  Vaping Use  . Vaping Use: Never used  Substance Use Topics  . Alcohol use: Not Currently    Comment: occasional, not while preg  . Drug use: Yes    Types: Marijuana    Comment: last used 11/22/20    Allergies: No Known Allergies  Medications Prior to Admission  Medication Sig Dispense Refill Last Dose  . acetaminophen (TYLENOL) 500 MG tablet Take 1 tablet (500 mg total) by mouth every 6 (six) hours as needed. 30  tablet 0   . coconut oil OIL Apply 1 application topically as needed.  0   . levETIRAcetam (KEPPRA) 1000 MG tablet Take 1,000 mg by mouth 2 (two) times daily.       Review of Systems  All other systems reviewed and are negative.  Physical Exam   Blood pressure 110/72, pulse 71, temperature 98.5 F (36.9 C), temperature source Oral, resp. rate 16, last menstrual period 09/30/2020, SpO2 99 %, not currently breastfeeding.  Physical Exam Vitals and nursing note reviewed. Exam conducted with a chaperone present.  Constitutional:      Appearance: She is well-developed.  HENT:     Head: Normocephalic and atraumatic.  Abdominal:     Palpations: Abdomen is soft.     Tenderness: There is no abdominal tenderness. There is no right CVA tenderness or left CVA tenderness.     Hernia: A hernia is present. There is no hernia in the left inguinal area or right inguinal area.  Genitourinary:    Labia:        Right: No rash or tenderness.        Left: No rash or tenderness.      Vagina: No signs of injury and foreign body. No vaginal discharge, erythema, tenderness or bleeding.     Comments: Small blood clot found in cervical os.  Lymphadenopathy:     Lower Body: No right inguinal adenopathy. No left inguinal adenopathy.  Neurological:     Mental Status: She is alert.    Results for orders placed or performed during the hospital encounter of 11/23/20 (from the past 24 hour(s))  CBC     Status: Abnormal   Collection Time:  11/23/20  1:30 PM  Result Value Ref Range   WBC 7.5 4.0 - 10.5 K/uL   RBC 3.68 (L) 3.87 - 5.11 MIL/uL   Hemoglobin 11.7 (L) 12.0 - 15.0 g/dL   HCT 38.4 (L) 53.6 - 46.8 %   MCV 95.4 80.0 - 100.0 fL   MCH 31.8 26.0 - 34.0 pg   MCHC 33.3 30.0 - 36.0 g/dL   RDW 03.2 12.2 - 48.2 %   Platelets 245 150 - 400 K/uL   nRBC 0.0 0.0 - 0.2 %  hCG, quantitative, pregnancy     Status: Abnormal   Collection Time: 11/23/20  1:30 PM  Result Value Ref Range   hCG, Beta Chain, Quant, S 50,360 (H) <5 mIU/mL  Urinalysis, Routine w reflex microscopic Urine, Clean Catch     Status: Abnormal   Collection Time: 11/23/20  2:19 PM  Result Value Ref Range   Color, Urine STRAW (A) YELLOW   APPearance CLEAR CLEAR   Specific Gravity, Urine 1.008 1.005 - 1.030   pH 8.0 5.0 - 8.0   Glucose, UA NEGATIVE NEGATIVE mg/dL   Hgb urine dipstick LARGE (A) NEGATIVE   Bilirubin Urine NEGATIVE NEGATIVE   Ketones, ur 5 (A) NEGATIVE mg/dL   Protein, ur NEGATIVE NEGATIVE mg/dL   Nitrite NEGATIVE NEGATIVE   Leukocytes,Ua NEGATIVE NEGATIVE   RBC / HPF >50 (H) 0 - 5 RBC/hpf   WBC, UA 0-5 0 - 5 WBC/hpf   Bacteria, UA RARE (A) NONE SEEN   Squamous Epithelial / LPF 0-5 0 - 5   Mucus PRESENT    US OB LESS THAN 14 WEEKS WITH OB TRANSVAGINAL  Result Date: 11/23/2020 CLINICAL DATA:  Vaginal bleeding EXAM: TWIN OBSTETRIC <14WK [place] AND TRANSVAGINAL OB US COMPARISON:  None. FINDINGS: Number of IUPs:  2 Chorionicity/Amnionicity:  Dichorionic-diamniotic (thick membrane) TWIN 1 Yolk sac:  Visualized. Embryo:  Not Visualized. Cardiac Activity: Not Visualized. MSD: 16.5 mm   6 w   3 d TWIN 2 Yolk sac:  Not Visualized. Embryo:  Visualized. Cardiac Activity: Not Visualized. CRL: 20.5 mm   8 w 4 d                  [place] EDC: 07/01/2021 Subchorionic hemorrhage:  None visualized. Maternal uterus/adnexae: The right ovary measures 3.3 x 2.2 x 2.4 cm and the left ovary measures 2.7 x 1.7 x 1.6 cm. No free fluid or pelvic mass. IMPRESSION: 1. Twin  intrauterine pregnancy as above. 2. Twin 1 gestational sac contains only a yolk sac, with no fetal pole visualized. Findings are suspicious but not yet definitive for failed pregnancy. Recommend follow-up US in 10-14 days for definitive diagnosis. This recommendation follows SRU consensus guidelines: Diagnostic Criteria for Nonviable Pregnancy Early in the First Trimester. Malva Limes Med 2013; 295:2841-32. 3. Twin 2 gestational sac contains an embryo with a crown-rump length of 20.5 mm, but no cardiac activity. Findings meet definitive criteria for failed pregnancy. This follows SRU consensus guidelines: Diagnostic Criteria for Nonviable Pregnancy Early in the First Trimester. Macy Mis J Med [phone]. Electronically Signed   By: Sharlet Salina M.D.   On: 11/23/2020 15:09     MAU Course  Procedures  MDM: Imaging: I independent reviewed the images of the ultrasound. Appears to have twin gestation with dividing line seen. Twin A measures [redacted]w[redacted]d with no fetal heart rate. Twin B is an empty gestational sac with no fetal pole. Yolk sac is seen.   Assessment and Plan   1. Missed abortion   2. Vaginal bleeding    Will give cytotec. How to give discussed. Prescription for percocet given as well. F/u in office in 2 weeks.  Levie Heritage 11/23/2020, 3:38 PM

## 2020-11-23 NOTE — MAU Note (Addendum)
Brittany Hendrix is a 30 y.o. at [redacted]w[redacted]d here in MAU reporting: moderate vaginal bleeding that started in the past couple of hours. Had 2 large clots, states no grey tissue. Having some mild lower abdominal pulling pain.  Onset of complaint: today  Pain score: 1/10  Vitals:   11/23/20 1223  BP: 110/72  Pulse: 71  Resp: 16  Temp: 98.5 F (36.9 C)  SpO2: 99%      Lab orders placed from triage: UA

## 2020-11-23 NOTE — MAU Note (Signed)
Pt came out in to the lobby requesting to use the restroom. Upon RN assistance, vaginal bleeding noted in the hallway, pt room, and then into the restroom. Assisted pt to clean herself up, fresh pad and mesh panties provided. Informed Dr Adrian Blackwater of bleeding. No new orders received.

## 2020-11-25 ENCOUNTER — Encounter: Payer: Self-pay | Admitting: *Deleted

## 2020-11-30 ENCOUNTER — Encounter: Payer: Self-pay | Admitting: Physician Assistant

## 2020-11-30 ENCOUNTER — Telehealth: Payer: Medicaid Other | Admitting: Physician Assistant

## 2020-11-30 DIAGNOSIS — O021 Missed abortion: Secondary | ICD-10-CM

## 2020-11-30 NOTE — Progress Notes (Signed)
Brittany Hendrix, flippen are scheduled for a virtual visit with your provider today.    Just as we do with appointments in the office, we must obtain your consent to participate.  Your consent will be active for this visit and any virtual visit you may have with one of our providers in the next 365 days.    If you have a MyChart account, I can also send a copy of this consent to you electronically.  All virtual visits are billed to your insurance company just like a traditional visit in the office.  As this is a virtual visit, video technology does not allow for your provider to perform a traditional examination.  This may limit your provider's ability to fully assess your condition.  If your provider identifies any concerns that need to be evaluated in person or the need to arrange testing such as labs, EKG, etc, we will make arrangements to do so.    Although advances in technology are sophisticated, we cannot ensure that it will always work on either your end or our end.  If the connection with a video visit is poor, we may have to switch to a telephone visit.  With either a video or telephone visit, we are not always able to ensure that we have a secure connection.   I need to obtain your verbal consent now.   Are you willing to proceed with your visit today?   BRIT CARBONELL has provided verbal consent on 11/30/2020 for a virtual visit (video or telephone).   Margaretann Loveless, PA-C 11/30/2020  7:55 AM    MyChart Video Visit    Virtual Visit via Video Note   This visit type was conducted due to national recommendations for restrictions regarding the COVID-19 Pandemic (e.g. social distancing) in an effort to limit this patient's exposure and mitigate transmission in our community. This patient is at least at moderate risk for complications without adequate follow up. This format is felt to be most appropriate for this patient at this time. Physical exam was limited by quality of the video and  audio technology used for the visit.   Patient location: Home Provider location: Home office in [place]  I discussed the limitations of evaluation and management by telemedicine and the availability of in person appointments. The patient expressed understanding and agreed to proceed.  Patient: Brittany Hendrix   DOB: 01-Jan-1991   30 y.o. Female  MRN: [id number] Visit Date: 11/30/2020  Today's healthcare provider: Margaretann Loveless, PA-C   No chief complaint on file.  Subjective    HPI  Brittany Hendrix is a 30 yr old female that presents today via [facility] for a prescription of Cytotec. She was seen in the ER on 11/23/20 for missed abortion. It was seen on [place] that she had twins yolk sacs, 1 without heart activity and the other without a fetal pole. She reports she was given a written Rx from the ER but the pharmacy would not accept. She has waited to see if she would have a spontaneous abortion, but has not passed any tissues. She is requesting a Rx be sent electronically so she could start.  Patient Active Problem List   Diagnosis Date Noted  . Encounter for induction of labor 05/20/2020  . Polyhydramnios affecting pregnancy 03/27/2020  . Obesity during pregnancy, antepartum 01/02/2020  . Supervision of high risk pregnancy, antepartum 11/07/2019  . 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 Nonintractable epilepsy without status epilepticus (HCC) 04/17/2017  .  Obesity (BMI 35.0-39.9 without comorbidity) 12/25/2015  . Seizure disorder in pregnancy, antepartum (HCC) 05/28/2015  . 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 Abortion history   . Headache(784.0)   . Pregnant   . Seizures [facility])    Last seizure March 2020  . Supervision of high-risk pregnancy 05/28/2015    Clinic [facility] Prenatal Labs  Dating 5 week scan Blood type:   A pos  Genetic Screen Normal NT/NB 1 Screen:        AFP:     Antibody: neg  Anatomic  [place]  Rubella:  immune  GTT Early: 124          Third trimester:  RPR:   NR  Flu vaccine Declined HBsAg:   neg  TDaP vaccine                                           HIV: Non Reactive (09/26 0020)   Baby Food Breast                                              Medications: Outpatient Medications Prior to Visit  Medication Sig  . acetaminophen (TYLENOL) 500 MG tablet Take 1 tablet (500 mg total) by mouth every 6 (six) hours as needed.  . coconut oil OIL Apply 1 application topically as needed.  . levETIRAcetam (KEPPRA) 1000 MG tablet Take 1,000 mg by mouth 2 (two) times daily.  . misoprostol (CYTOTEC) 200 MCG tablet Place four tablets in between your gums and cheeks (two tablets on each side) as instructed  . oxyCODONE-acetaminophen (PERCOCET) 5-325 MG tablet Take 1 tablet by mouth every 4 (four) hours as needed for severe pain.   No facility-administered medications prior to visit.    Review of Systems  Constitutional: Negative.   Respiratory: Negative.   Cardiovascular: Negative.   Gastrointestinal: Positive for abdominal pain.  Genitourinary: Positive for vaginal bleeding (had but not since 11/24/20).    Last CBC Lab Results  Component Value Date   WBC 7.5 11/23/2020   HGB 11.7 (L) 11/23/2020   HCT 35.1 (L) 11/23/2020   MCV 95.4 11/23/2020   MCH 31.8 11/23/2020   RDW 12.1 11/23/2020   PLT 245 11/23/2020   Last metabolic panel Lab Results  Component Value Date   GLUCOSE 94 10/07/2019   NA 137 10/07/2019   K 3.6 10/07/2019   CL 105 10/07/2019   CO2 23 10/07/2019   BUN 10 10/07/2019   CREATININE 0.57 05/21/2020   GFRNONAA >60 05/21/2020   GFRAA >60 10/07/2019   CALCIUM 9.0 10/07/2019   PROT 6.5 10/07/2019   ALBUMIN 3.7 10/07/2019   BILITOT 0.5 10/07/2019   ALKPHOS 53 10/07/2019   AST 16 10/07/2019   ALT 19 10/07/2019   ANIONGAP 9 10/07/2019      Objective    LMP 09/30/2020  BP Readings from Last 3 Encounters:  11/23/20 126/69  11/10/20 104/66  06/23/20 (!)  109/57   Wt Readings from Last 3 Encounters:  06/23/20 196 lb (88.9 kg)  05/21/20 205 lb 4.8 oz (93.1 kg)  05/19/20 205 lb 4.8 oz (93.1 kg)      Physical Exam Vitals reviewed.  Constitutional:  General: She is not in acute distress.    Appearance: Normal appearance. She is well-developed. She is not ill-appearing.  HENT:     Head: Normocephalic and atraumatic.  Pulmonary:     Effort: Pulmonary effort is normal. No respiratory distress.  Musculoskeletal:     Cervical back: Normal range of motion and neck supple.  Neurological:     Mental Status: She is alert.  Psychiatric:        Mood and Affect: Mood normal.        Behavior: Behavior normal.        Thought Content: Thought content normal.        Judgment: Judgment normal.        Assessment & Plan     1. Missed abortion with fetal demise before 20 completed weeks of gestation - Advised patient we could not prescribe this medication and advised it would be best if she called [facility]  - She voiced understanding   No follow-ups on file.     I discussed the assessment and treatment plan with the patient. The patient was provided an opportunity to ask questions and all were answered. The patient agreed with the plan and demonstrated an understanding of the instructions.   The patient was advised to call back or seek an in-person evaluation if the symptoms worsen or if the condition fails to improve as anticipated.  I provided 8 minutes of face-to-face time during this encounter via MyChart Video enabled encounter.   Reine Just [facility] Tele[facility] [phone] (phone) [phone] (fax)  [facility]

## 2020-12-07 ENCOUNTER — Encounter: Payer: Medicaid Other | Admitting: Licensed Clinical Social Worker

## 2020-12-07 ENCOUNTER — Other Ambulatory Visit: Payer: Self-pay

## 2020-12-07 ENCOUNTER — Ambulatory Visit (INDEPENDENT_AMBULATORY_CARE_PROVIDER_SITE_OTHER): Payer: Medicaid Other | Admitting: Obstetrics & Gynecology

## 2020-12-07 ENCOUNTER — Encounter: Payer: Self-pay | Admitting: Obstetrics & Gynecology

## 2020-12-07 VITALS — BP 90/58 | HR 64 | Ht 64.0 in | Wt 214.0 lb

## 2020-12-07 DIAGNOSIS — F419 Anxiety disorder, unspecified: Secondary | ICD-10-CM | POA: Diagnosis not present

## 2020-12-07 DIAGNOSIS — O039 Complete or unspecified spontaneous abortion without complication: Secondary | ICD-10-CM

## 2020-12-07 DIAGNOSIS — E349 Endocrine disorder, unspecified: Secondary | ICD-10-CM

## 2020-12-07 DIAGNOSIS — Z5189 Encounter for other specified aftercare: Secondary | ICD-10-CM | POA: Diagnosis not present

## 2020-12-07 NOTE — Progress Notes (Signed)
GYNECOLOGY OFFICE VISIT NOTE  History:   Brittany Hendrix is a 30 y.o. [phone] here today for follow up of SAB.  Diagnosed recently with MAB, treated with misoprostol on 11/23/2020.  Reports that she passed the tissue and had heavy bleeding at home. No bleeding, pain currently.  She denies any abnormal vaginal discharge, bleeding, pelvic pain or other concerns.    Past Medical History:  Diagnosis Date  . Abortion history   . Headache(784.0)   . Seizures [facility])    Last seizure March 2020    Past Surgical History:  Procedure Laterality Date  . CESAREAN SECTION N/A 05/20/2020   Procedure: CESAREAN SECTION;  Surgeon: Tereso Newcomer, MD;  Location: [facility] LD ORS;  Service: Obstetrics;  Laterality: N/A;  . DILATION AND CURETTAGE OF UTERUS     abortion  . DILATION AND EVACUATION N/A 10/04/2017   Procedure: DILATATION AND EVACUATION;  Surgeon: Silverio Lay, MD;  Location: [facility] ORS;  Service: Gynecology;  Laterality: N/A;    The following portions of the patient's history were reviewed and updated as appropriate: allergies, current medications, past family history, past medical history, past social history, past surgical history and problem list.   Health Maintenance:  Normal pap on 11/07/2019.   Review of Systems:  Pertinent items noted in HPI and remainder of comprehensive ROS otherwise negative.  Physical Exam:  BP (!) 90/58   Pulse 64   Ht 5\' 4"  (1.626 m)   Wt 214 lb (97.1 kg)   LMP 09/30/2020 Comment: SAB  Breastfeeding Unknown   BMI 36.73 kg/m  CONSTITUTIONAL: Well-developed, well-nourished female in no acute distress.  MUSCULOSKELETAL: Normal range of motion. No edema noted. NEUROLOGIC: Alert and oriented to person, place, and time. Normal muscle tone coordination. No cranial nerve deficit noted. PSYCHIATRIC: Normal mood and affect. Normal behavior. Normal judgment and thought content. CARDIOVASCULAR: Normal heart rate noted RESPIRATORY: Effort and breath sounds normal, no  problems with respiration noted ABDOMEN: No masses noted. No other overt distention noted.   PELVIC: Deferred  Labs and Imaging No results found for this or any previous visit (from the past 168 hour(s)). 11/30/2020 OB LESS THAN 14 WEEKS WITH OB TRANSVAGINAL  Result Date: 11/23/2020 CLINICAL DATA:  Vaginal bleeding EXAM: TWIN OBSTETRIC <14WK 01/23/2021 AND TRANSVAGINAL OB US COMPARISON:  None. FINDINGS: Number of IUPs:  2 Chorionicity/Amnionicity:  Dichorionic-diamniotic (thick membrane) TWIN 1 Yolk sac:  Visualized. Embryo:  Not Visualized. Cardiac Activity: Not Visualized. MSD: 16.5 mm   6 w   3 d TWIN 2 Yolk sac:  Not Visualized. Embryo:  Visualized. Cardiac Activity: Not Visualized. CRL: 20.5 mm   8 w 4 d                  [place] EDC: 07/01/2021 Subchorionic hemorrhage:  None visualized. Maternal uterus/adnexae: The right ovary measures 3.3 x 2.2 x 2.4 cm and the left ovary measures 2.7 x 1.7 x 1.6 cm. No free fluid or pelvic mass. IMPRESSION: 1. Twin intrauterine pregnancy as above. 2. Twin 1 gestational sac contains only a yolk sac, with no fetal pole visualized. Findings are suspicious but not yet definitive for failed pregnancy. Recommend follow-up 14/02/2021 in 10-14 days for definitive diagnosis. This recommendation follows SRU consensus guidelines: Diagnostic Criteria for Nonviable Pregnancy Early in the First Trimester. 04-19-1979 Med [id number]. 3. Twin 2 gestational sac contains an embryo with a crown-rump length of 20.5 mm, but no cardiac activity. Findings meet definitive criteria for failed pregnancy. This  follows SRU consensus guidelines: Diagnostic Criteria for Nonviable Pregnancy Early in the First Trimester. Macy Mis J Med [phone]. Electronically Signed   By: Sharlet Salina M.D.   On: 11/23/2020 15:09      Assessment and Plan:      1. Follow-up visit after miscarriage Reports completed miscarriage. Will check HCG level to follow up - Beta hCG quant (ref lab) Discussed future conception plans, wants  to get pregnant soon. Had c/s in 04/2020, advised 18 month gap in between children, also to reduce risk of uterine rupture. She will use condoms, NFP for a few months then try to conceive again. Told to take MVI with folic acid (she is on Keppra) daily, avoid teratogens.  2. Anxiety Scheduled for appointment with [facility] clinician today, but she will reschedule due to work.   Routine preventative health maintenance measures emphasized. Please refer to After Visit Summary for other counseling recommendations.   Return for any gynecologic concerns. Also needs to reschedule [facility] clinician appointment due to work..    I spent 20 minutes dedicated to the care of this patient including pre-visit review of records, face to face time with the patient discussing her conditions and treatments and post visit ordering of testing.    Jaynie Collins, MD, FACOG Obstetrician & Gynecologist, [facility] for [employer], [facility]

## 2020-12-07 NOTE — Progress Notes (Signed)
GAD-7=12   PHQ-9=4  GYN presents for SAB FU, denies bleeding, pain, fever.

## 2020-12-08 LAB — BETA HCG QUANT (REF LAB): hCG Quant: 29749 m[IU]/mL

## 2020-12-08 NOTE — Addendum Note (Signed)
Addended by: Jaynie Collins A on: 12/08/2020 08:09 AM   Modules accepted: Orders

## 2020-12-14 ENCOUNTER — Other Ambulatory Visit: Payer: Self-pay | Admitting: Neurology

## 2020-12-15 ENCOUNTER — Encounter: Payer: Medicaid Other | Admitting: Advanced Practice Midwife

## 2020-12-17 ENCOUNTER — Telehealth: Payer: Self-pay | Admitting: Neurology

## 2020-12-17 ENCOUNTER — Other Ambulatory Visit: Payer: Self-pay | Admitting: Neurology

## 2020-12-17 MED ORDER — LEVETIRACETAM 1000 MG PO TABS: 1000.0000 mg | ORAL_TABLET | Freq: Two times a day (BID) | ORAL | 0 refills | Status: DC

## 2020-12-17 NOTE — Telephone Encounter (Signed)
Pt requested refill levETIRAcetam (KEPPRA) 1000 MG tablet at [facility] [phone]. Scheduled appt 02/18/21.

## 2020-12-17 NOTE — Telephone Encounter (Signed)
Noted  

## 2020-12-17 NOTE — Telephone Encounter (Signed)
LMVM for her that we sent in prescription.

## 2020-12-17 NOTE — Telephone Encounter (Signed)
Chart reviewed, last visit was on April 29, 2020, patient had recurrent seizure in October 2020, following hollowing party, sleep deprived, was excessive alcohol intake,  Previously healthy delivery in 2017 taking Keppra 1500 mg daily, per record, she is on 1000 mg twice a day now,  Hospital admission May 20, 2020 for induction of labor, had a C-section due to malpresentation,  It is okay to skip the Keppra level, as much as she does not have recurrent seizure, keep follow-up appointment

## 2020-12-22 ENCOUNTER — Telehealth: Payer: Self-pay | Admitting: *Deleted

## 2020-12-22 ENCOUNTER — Other Ambulatory Visit: Payer: Self-pay

## 2020-12-22 ENCOUNTER — Other Ambulatory Visit: Payer: Self-pay | Admitting: Obstetrics & Gynecology

## 2020-12-22 ENCOUNTER — Ambulatory Visit
Admission: RE | Admit: 2020-12-22 | Discharge: 2020-12-22 | Disposition: A | Discharge status: Home / Self Care | Payer: Medicaid Other | Source: Ambulatory Visit | Attending: Obstetrics & Gynecology | Admitting: Obstetrics & Gynecology

## 2020-12-22 ENCOUNTER — Ambulatory Visit (INDEPENDENT_AMBULATORY_CARE_PROVIDER_SITE_OTHER): Payer: Medicaid Other | Admitting: Obstetrics and Gynecology

## 2020-12-22 VITALS — BP 117/74 | HR 86 | Wt 214.7 lb

## 2020-12-22 DIAGNOSIS — Z5189 Encounter for other specified aftercare: Secondary | ICD-10-CM | POA: Diagnosis present

## 2020-12-22 DIAGNOSIS — O020 Blighted ovum and nonhydatidiform mole: Secondary | ICD-10-CM

## 2020-12-22 DIAGNOSIS — O039 Complete or unspecified spontaneous abortion without complication: Secondary | ICD-10-CM

## 2020-12-22 DIAGNOSIS — E349 Endocrine disorder, unspecified: Secondary | ICD-10-CM | POA: Insufficient documentation

## 2020-12-22 NOTE — Telephone Encounter (Signed)
Call to patient- advised scheduled surgery for tomorrow, 12-23-20 at 1230, arrive at 1030 at [facility].  Pre-op instructions reviewed including need for driver and care for 24 hours.  Patient will submit FMLA forms.   Encounter closed.

## 2020-12-22 NOTE — H&P (View-Only) (Signed)
History:  [name] is a 30 y.o. [id number] who presents to clinic today for follow up of miscarriage.    Reports diagnosed with miscarriage May 2nd and prescribed cytotec per protocol. She had two week follow up where she reported that she thought she had passed tissue. At that time she had repeat HCG obtained that demonstrated suboptimal decline. She had repeat US today showing concerns for molar pregnancy.  She reports no further bleeding since 3 weeks ago. No fevers or chills. Still processing loss of pregnancy.  The following portions of the patient's history were reviewed and updated as appropriate: allergies, current medications, family history, past medical history, social history, past surgical history and problem list.  Review of Systems:  Review of Systems  Constitutional: Negative for chills and fever.  Cardiovascular: Negative for chest pain.  Gastrointestinal: Negative for abdominal pain, nausea and vomiting.  Genitourinary: Negative for dysuria.  Neurological: Negative for dizziness.      Objective:  Physical Exam BP 117/74   Pulse 86   Wt 214 lb 11.2 oz (97.4 kg)   LMP 09/30/2020   BMI 36.85 kg/m  Physical Exam Vitals and nursing note reviewed.  Constitutional:      Appearance: Normal appearance.  HENT:     Head: Normocephalic and atraumatic.  Cardiovascular:     Rate and Rhythm: Normal rate.  Pulmonary:     Effort: Pulmonary effort is normal.  Abdominal:     General: Bowel sounds are normal.     Palpations: Abdomen is soft.     Tenderness: There is no abdominal tenderness.  Neurological:     Mental Status: She is alert.  Psychiatric:        Mood and Affect: Mood normal.        Behavior: Behavior normal.       Labs and Imaging No results found for this or any previous visit (from the past 24 hour(s)).  US OB LESS THAN 14 WEEKS WITH OB TRANSVAGINAL  Result Date: 12/22/2020 CLINICAL DATA:  Elevated beta HCG following Ms. Of prostate  ministry shin, concern for retained products of conception EXAM: OBSTETRIC <14 WK [place] AND TRANSVAGINAL OB US TECHNIQUE: Both transabdominal and transvaginal ultrasound examinations were performed for complete evaluation of the gestation as well as the maternal uterus, adnexal regions, and pelvic cul-de-sac. Transvaginal technique was performed to assess early pregnancy. COMPARISON:  11/23/2020 FINDINGS: Intrauterine gestational sac: None Yolk sac:  Not Visualized. Embryo:  Not Visualized. Cardiac Activity: Not Visualized. Heart Rate: Not applicable. Subchorionic hemorrhage:  None visualized. Maternal uterus/adnexae: There is a heterogeneous, multiloculated mixed solid and cystic lesion of the posterior uterine fundus, measuring approximately 6.4 x 5.7 x 1.4 cm. Normal ovaries. IMPRESSION: 1.  No candidate intrauterine gestation identified. 2. Heterogeneous, multiloculated mixed solid and cystic lesion of the posterior uterine fundus, measuring approximately 6.4 x 5.7 x 1.4 cm, concerning for complete molar pregnancy or other gestational trophoblastic disease. These results will be called to the ordering clinician or representative by the Radiologist Assistant, and communication documented in the PACS or [employer]. Electronically Signed   By: Lauralyn Primes M.D.   On: 12/22/2020 14:29   Results for Brittany, Hendrix (MRN [id number]) as of 12/22/2020 15:20  Ref. Range 11/23/2020 13:30 11/23/2020 14:19 11/23/2020 14:53 12/07/2020 15:04  hCG Quant Latest Units: mIU/mL    41,937  HCG, Beta Chain, Quant, S Latest Ref Range: <5 mIU/mL 50,360 (H)       Assessment & Plan:  1. Molar pregnancy -see [place] results above, concern for molar pregnancy. Discussed with patient. Message sent toschedule D&C. Answered all of patient's questions.   2. Follow-up visit after miscarriage    Gita Kudo, MD 12/22/2020 3:07 PM    Casper Harrison, MD [facility] Fellow, [facility],  [facility]

## 2020-12-22 NOTE — Progress Notes (Signed)
 History:  Ms. Breella B Lanagan is a 30 y.o. [id number] who presents to clinic today for follow up of miscarriage.    Reports diagnosed with miscarriage May 2nd and prescribed cytotec per protocol. She had two week follow up where she reported that she thought she had passed tissue. At that time she had repeat HCG obtained that demonstrated suboptimal decline. She had repeat US today showing concerns for molar pregnancy.  She reports no further bleeding since 3 weeks ago. No fevers or chills. Still processing loss of pregnancy.  The following portions of the patient's history were reviewed and updated as appropriate: allergies, current medications, family history, past medical history, social history, past surgical history and problem list.  Review of Systems:  Review of Systems  Constitutional: Negative for chills and fever.  Cardiovascular: Negative for chest pain.  Gastrointestinal: Negative for abdominal pain, nausea and vomiting.  Genitourinary: Negative for dysuria.  Neurological: Negative for dizziness.      Objective:  Physical Exam BP 117/74   Pulse 86   Wt 214 lb 11.2 oz (97.4 kg)   LMP 09/30/2020   BMI 36.85 kg/m  Physical Exam Vitals and nursing note reviewed.  Constitutional:      Appearance: Normal appearance.  HENT:     Head: Normocephalic and atraumatic.  Cardiovascular:     Rate and Rhythm: Normal rate.  Pulmonary:     Effort: Pulmonary effort is normal.  Abdominal:     General: Bowel sounds are normal.     Palpations: Abdomen is soft.     Tenderness: There is no abdominal tenderness.  Neurological:     Mental Status: She is alert.  Psychiatric:        Mood and Affect: Mood normal.        Behavior: Behavior normal.       Labs and Imaging No results found for this or any previous visit (from the past 24 hour(s)).  US OB LESS THAN 14 WEEKS WITH OB TRANSVAGINAL  Result Date: 12/22/2020 CLINICAL DATA:  Elevated beta HCG following Ms. Of prostate  ministry shin, concern for retained products of conception EXAM: OBSTETRIC <14 WK US AND TRANSVAGINAL OB US TECHNIQUE: Both transabdominal and transvaginal ultrasound examinations were performed for complete evaluation of the gestation as well as the maternal uterus, adnexal regions, and pelvic cul-de-sac. Transvaginal technique was performed to assess early pregnancy. COMPARISON:  11/23/2020 FINDINGS: Intrauterine gestational sac: None Yolk sac:  Not Visualized. Embryo:  Not Visualized. Cardiac Activity: Not Visualized. Heart Rate: Not applicable. Subchorionic hemorrhage:  None visualized. Maternal uterus/adnexae: There is a heterogeneous, multiloculated mixed solid and cystic lesion of the posterior uterine fundus, measuring approximately 6.4 x 5.7 x 1.4 cm. Normal ovaries. IMPRESSION: 1.  No candidate intrauterine gestation identified. 2. Heterogeneous, multiloculated mixed solid and cystic lesion of the posterior uterine fundus, measuring approximately 6.4 x 5.7 x 1.4 cm, concerning for complete molar pregnancy or other gestational trophoblastic disease. These results will be called to the ordering clinician or representative by the Radiologist Assistant, and communication documented in the PACS or [employer]. Electronically Signed   By: Alex  Bibbey M.D.   On: 12/22/2020 14:29   Results for Sakuma, Anayelli B (MRN [id number]) as of 12/22/2020 15:20  Ref. Range 11/23/2020 13:30 11/23/2020 14:19 11/23/2020 14:53 12/07/2020 15:04  hCG Quant Latest Units: mIU/mL    29,749  HCG, Beta Chain, Quant, S Latest Ref Range: <5 mIU/mL 50,360 (H)       Assessment & Plan:    1. Molar pregnancy -see [place] results above, concern for molar pregnancy. Discussed with patient. Message sent toschedule D&C. Answered all of patient's questions.   2. Follow-up visit after miscarriage    Gita Kudo, MD 12/22/2020 3:07 PM    Casper Harrison, MD [facility] Fellow, [facility],  [facility]

## 2020-12-22 NOTE — Patient Instructions (Addendum)
Molar Pregnancy   A molar pregnancy (hydatidiform mole) is a mass of tissue that grows in the uterus after an egg is fertilized incorrectly. The mass does not develop into a fetus, and is considered an abnormal pregnancy. Usually, the pregnancy ends on its own through miscarriage. In some cases, treatment may be required. What are the causes? This condition is caused by an egg that is fertilized incorrectly so that it has abnormal genetic material (chromosomes). This can result in one of two types of molar pregnancies:  Complete molar pregnancy. This is when all of the chromosomes in the fertilized egg are from the father, and none are from the mother.  Partial molar pregnancy. This is when two sperm fertilize one egg. What increases the risk? This condition is more likely to develop in:  Women who are over the age of 6 or under the age of 48.  Women who have had a molar pregnancy in the past. Other possible risk factors include:  Smoking more than 15 cigarettes a day.  History of infertility or miscarriages.  Having a lack (deficiency) of vitamin A. What are the signs or symptoms? Symptoms of this condition include:  Vaginal bleeding.  Severe nausea and vomiting.  Severe pressure or pain in the uterus.  Vaginal discharge that looks like grapes.  High blood pressure. How is this diagnosed? This condition is diagnosed based on ultrasound and blood tests. How is this treated? Usually, molar pregnancies end on their own by miscarriage. In some cases, treatment may include:  Monitoring the levels of pregnancy hormones in your blood to make sure that the hormone levels are decreasing as expected.  Taking a medicine called Rho (D) immune globulin. This medicine helps to prevent problems that may occur in future pregnancies as a result of a protein on red blood cells (Rh factor). You may be given this medicine if you do not have an Rh factor (you are Rh negative).  Having a  procedure called dilation and curettage (D&C), or vacuum curettage. These are minor procedures that involve scraping or suctioning the molar pregnancy out of the uterus and removing it through the vagina. Even if a molar pregnancy ends on its own, one of these procedures may be done to make sure that all of the abnormal tissue is out of the uterus.  Having a surgical removal of the uterus (hysterectomy).  Taking medicines, including chemotherapy medicines. This may be done if your pregnancy hormone levels are not decreasing as expected. Follow these instructions at home:  Avoid getting pregnant for 6-12 months, or as long as told by your health care provider. To avoid getting pregnant, avoid having sex or use a reliable form of birth control every time you have sex.  Take over-the-counter and prescription medicines only as told by your health care provider.  Rest as needed. Return to your normal activities as told by your health care provider. Ask your health care provider what activities are safe for you.  Consider joining a support group. If you are struggling with grief, ask your health care provider for help.  Keep all follow-up visits. This is important. You may need follow-up blood tests or ultrasounds. Contact a health care provider if:  You continue to have irregular vaginal bleeding.  You have pain in your abdomen. Summary  A molar pregnancy (hydatidiform mole) is a mass of tissue that grows in the uterus after an egg is fertilized incorrectly.  This condition is more likely to develop in women  who are over the age of 72 or under the age of 77 or women who have had a molar pregnancy in the past.  The most common symptom of this condition is vaginal bleeding.  Usually, molar pregnancy ends with a miscarriage. This information is not intended to replace advice given to you by your health care provider. Make sure you discuss any questions you have with your health care  provider. Document Revised: 03/02/2020 Document Reviewed: 03/02/2020 Elsevier Patient Education  2021 [employer].

## 2020-12-22 NOTE — Progress Notes (Signed)
I was unable to reach Ms Freeburg by phone.  I left  a message on voice mail.  I instructed the patient to arrive at [facility] Main entrance at 1015 , register in the Admitting Office. DO NOT eat anything after midnight. You may have clear liquids until  0945, I instructed what clear liquids consist of. I instructed the patient to take the following medications in the am with just enough water to get them down:  Keppra.  I instructed Ms Okubo to shower with antibacteria soap, dry off with a clean towel. patient to not wear any lotions, powders, cologne, jewelry, piercing, make-up or nail polish.  Wear clean clothes. Brush teeth. I informed patient that there will need to be a driver and someone to stay with her for the first 24 hours after surgery.   I instructed  patient to call [phone], in the am if there were any questions or problems.

## 2020-12-23 ENCOUNTER — Encounter (HOSPITAL_COMMUNITY): Payer: Self-pay | Admitting: Obstetrics & Gynecology

## 2020-12-23 ENCOUNTER — Other Ambulatory Visit: Payer: Self-pay

## 2020-12-23 ENCOUNTER — Other Ambulatory Visit (HOSPITAL_COMMUNITY): Payer: Medicaid Other

## 2020-12-23 ENCOUNTER — Ambulatory Visit (HOSPITAL_COMMUNITY)
Admission: RE | Admit: 2020-12-23 | Discharge: 2020-12-23 | Disposition: A | Discharge status: Home / Self Care | Payer: Medicaid Other | Source: Ambulatory Visit | Attending: Obstetrics & Gynecology | Admitting: Obstetrics & Gynecology

## 2020-12-23 ENCOUNTER — Other Ambulatory Visit (HOSPITAL_COMMUNITY): Payer: Self-pay | Admitting: Obstetrics & Gynecology

## 2020-12-23 ENCOUNTER — Other Ambulatory Visit: Payer: Self-pay | Admitting: Obstetrics & Gynecology

## 2020-12-23 ENCOUNTER — Ambulatory Visit (HOSPITAL_COMMUNITY): Payer: Medicaid Other | Admitting: Anesthesiology

## 2020-12-23 ENCOUNTER — Ambulatory Visit (HOSPITAL_COMMUNITY)
Admission: RE | Admit: 2020-12-23 | Discharge: 2020-12-23 | Disposition: A | Discharge status: Home / Self Care | Payer: Medicaid Other | Attending: Obstetrics & Gynecology | Admitting: Obstetrics & Gynecology

## 2020-12-23 ENCOUNTER — Encounter: Payer: Self-pay | Admitting: Physician Assistant

## 2020-12-23 ENCOUNTER — Telehealth: Payer: Medicaid Other | Admitting: Physician Assistant

## 2020-12-23 ENCOUNTER — Encounter (HOSPITAL_COMMUNITY)
Admission: RE | Disposition: A | Discharge status: Home / Self Care | Payer: Self-pay | Source: Home / Self Care | Attending: Obstetrics & Gynecology

## 2020-12-23 DIAGNOSIS — O039 Complete or unspecified spontaneous abortion without complication: Secondary | ICD-10-CM | POA: Diagnosis not present

## 2020-12-23 DIAGNOSIS — O020 Blighted ovum and nonhydatidiform mole: Secondary | ICD-10-CM

## 2020-12-23 DIAGNOSIS — O411010 Infection of amniotic sac and membranes, unspecified, first trimester, not applicable or unspecified: Secondary | ICD-10-CM | POA: Diagnosis not present

## 2020-12-23 DIAGNOSIS — Z0289 Encounter for other administrative examinations: Secondary | ICD-10-CM | POA: Diagnosis not present

## 2020-12-23 DIAGNOSIS — Z3A01 Less than 8 weeks gestation of pregnancy: Secondary | ICD-10-CM | POA: Diagnosis not present

## 2020-12-23 HISTORY — PX: DILATION AND EVACUATION: SHX1459

## 2020-12-23 HISTORY — PX: OPERATIVE ULTRASOUND: SHX5996

## 2020-12-23 LAB — TYPE AND SCREEN
ABO/RH(D): A POS
Antibody Screen: NEGATIVE

## 2020-12-23 SURGERY — DILATION AND EVACUATION, UTERUS
Anesthesia: General

## 2020-12-23 MED ORDER — OXYTOCIN-SODIUM CHLORIDE 30-0.9 UT/500ML-% IV SOLN
INTRAVENOUS | Status: DC | PRN
  Administered 2020-12-23: 20 [IU] via INTRAVENOUS

## 2020-12-23 MED ORDER — PROMETHAZINE HCL 25 MG/ML IJ SOLN: 6.2500 mg | INTRAMUSCULAR | Status: DC | PRN

## 2020-12-23 MED ORDER — 0.9 % SODIUM CHLORIDE (POUR BTL) OPTIME
TOPICAL | Status: DC | PRN
  Administered 2020-12-23: 1000 mL

## 2020-12-23 MED ORDER — FENTANYL CITRATE (PF) 100 MCG/2ML IJ SOLN
INTRAMUSCULAR | Status: AC
  Filled 2020-12-23: qty 2

## 2020-12-23 MED ORDER — OXYTOCIN 10 UNIT/ML IJ SOLN
INTRAMUSCULAR | Status: AC
  Filled 2020-12-23: qty 2

## 2020-12-23 MED ORDER — ONDANSETRON HCL 4 MG/2ML IJ SOLN
INTRAMUSCULAR | Status: DC | PRN
  Administered 2020-12-23: 4 mg via INTRAVENOUS

## 2020-12-23 MED ORDER — BUPIVACAINE HCL 0.5 % IJ SOLN
INTRAMUSCULAR | Status: AC
  Filled 2020-12-23: qty 1

## 2020-12-23 MED ORDER — ACETAMINOPHEN 500 MG PO TABS
ORAL_TABLET | ORAL | Status: AC
  Administered 2020-12-23: 1000 mg via ORAL
  Filled 2020-12-23: qty 2

## 2020-12-23 MED ORDER — FENTANYL CITRATE (PF) 100 MCG/2ML IJ SOLN
25.0000 ug | INTRAMUSCULAR | Status: DC | PRN
  Administered 2020-12-23 (×2): 50 ug via INTRAVENOUS

## 2020-12-23 MED ORDER — MIDAZOLAM HCL 2 MG/2ML IJ SOLN
INTRAMUSCULAR | Status: AC
  Filled 2020-12-23: qty 2

## 2020-12-23 MED ORDER — ACETAMINOPHEN 10 MG/ML IV SOLN: 1000.0000 mg | Freq: Once | INTRAVENOUS | Status: DC | PRN

## 2020-12-23 MED ORDER — ACETAMINOPHEN 500 MG PO TABS: 1000.0000 mg | ORAL_TABLET | Freq: Once | ORAL | Status: AC

## 2020-12-23 MED ORDER — LIDOCAINE 2% (20 MG/ML) 5 ML SYRINGE
INTRAMUSCULAR | Status: DC | PRN
  Administered 2020-12-23: 80 mg via INTRAVENOUS

## 2020-12-23 MED ORDER — FENTANYL CITRATE (PF) 100 MCG/2ML IJ SOLN
INTRAMUSCULAR | Status: DC | PRN
  Administered 2020-12-23 (×3): 50 ug via INTRAVENOUS

## 2020-12-23 MED ORDER — OXYCODONE-ACETAMINOPHEN 5-325 MG PO TABS: 1.0000 | ORAL_TABLET | Freq: Four times a day (QID) | ORAL | 0 refills | Status: DC | PRN

## 2020-12-23 MED ORDER — MIDAZOLAM HCL 5 MG/5ML IJ SOLN
INTRAMUSCULAR | Status: DC | PRN
  Administered 2020-12-23: 2 mg via INTRAVENOUS

## 2020-12-23 MED ORDER — LACTATED RINGERS IV SOLN: INTRAVENOUS | Status: DC

## 2020-12-23 MED ORDER — PROPOFOL 10 MG/ML IV BOLUS
INTRAVENOUS | Status: DC | PRN
  Administered 2020-12-23: 150 mg via INTRAVENOUS

## 2020-12-23 MED ORDER — DOXYCYCLINE HYCLATE 100 MG IV SOLR
200.0000 mg | INTRAVENOUS | Status: AC
  Administered 2020-12-23: 200 mg via INTRAVENOUS
  Filled 2020-12-23: qty 200

## 2020-12-23 MED ORDER — POVIDONE-IODINE 10 % EX SWAB
2.0000 "application " | Freq: Once | CUTANEOUS | Status: AC
  Administered 2020-12-23: 2 via TOPICAL

## 2020-12-23 MED ORDER — CHLORHEXIDINE GLUCONATE 0.12 % MT SOLN
15.0000 mL | Freq: Once | OROMUCOSAL | Status: AC
  Administered 2020-12-23: 15 mL via OROMUCOSAL
  Filled 2020-12-23: qty 15

## 2020-12-23 MED ORDER — FENTANYL CITRATE (PF) 250 MCG/5ML IJ SOLN
INTRAMUSCULAR | Status: AC
  Filled 2020-12-23: qty 5

## 2020-12-23 MED ORDER — DEXAMETHASONE SODIUM PHOSPHATE 4 MG/ML IJ SOLN
INTRAMUSCULAR | Status: DC | PRN
  Administered 2020-12-23: 10 mg via INTRAVENOUS

## 2020-12-23 MED ORDER — ORAL CARE MOUTH RINSE: 15.0000 mL | Freq: Once | OROMUCOSAL | Status: AC

## 2020-12-23 MED ORDER — KETOROLAC TROMETHAMINE 15 MG/ML IJ SOLN
INTRAMUSCULAR | Status: DC | PRN
  Administered 2020-12-23: 15 mg via INTRAVENOUS

## 2020-12-23 SURGICAL SUPPLY — 20 items
CATH ROBINSON RED A/P 16FR (CATHETERS) ×2 IMPLANT
DECANTER SPIKE VIAL GLASS SM (MISCELLANEOUS) IMPLANT
FILTER UTR ASPR ASSEMBLY (MISCELLANEOUS) ×2 IMPLANT
GLOVE BIO SURGEON STRL SZ 6.5 (GLOVE) ×2 IMPLANT
GLOVE SURG UNDER POLY LF SZ7 (GLOVE) ×4 IMPLANT
GOWN STRL REUS W/ TWL LRG LVL3 (GOWN DISPOSABLE) ×2 IMPLANT
GOWN STRL REUS W/TWL LRG LVL3 (GOWN DISPOSABLE) ×4
HOSE CONNECTING 18IN BERKELEY (TUBING) ×2 IMPLANT
KIT BERKELEY 1ST TRI 3/8 NO TR (MISCELLANEOUS) ×2 IMPLANT
KIT BERKELEY 1ST TRIMESTER 3/8 (MISCELLANEOUS) ×2 IMPLANT
NS IRRIG 1000ML POUR BTL (IV SOLUTION) ×2 IMPLANT
PACK VAGINAL MINOR WOMEN LF (CUSTOM PROCEDURE TRAY) ×2 IMPLANT
PAD OB MATERNITY 4.3X12.25 (PERSONAL CARE ITEMS) ×2 IMPLANT
SET BERKELEY SUCTION TUBING (SUCTIONS) ×2 IMPLANT
TOWEL GREEN STERILE FF (TOWEL DISPOSABLE) ×4 IMPLANT
UNDERPAD 30X36 HEAVY ABSORB (UNDERPADS AND DIAPERS) ×2 IMPLANT
VACURETTE 10 RIGID CVD (CANNULA) ×2 IMPLANT
VACURETTE 7MM CVD STRL WRAP (CANNULA) IMPLANT
VACURETTE 8 RIGID CVD (CANNULA) IMPLANT
VACURETTE 9 RIGID CVD (CANNULA) IMPLANT

## 2020-12-23 NOTE — Anesthesia Procedure Notes (Signed)
Procedure Name: LMA Insertion Date/Time: 12/23/2020 12:50 PM Performed by: Mayer Camel, CRNA Pre-anesthesia Checklist: Patient identified, Emergency Drugs available, Suction available and Patient being monitored Patient Re-evaluated:Patient Re-evaluated prior to induction Oxygen Delivery Method: Circle system utilized Preoxygenation: Pre-oxygenation with 100% oxygen Induction Type: IV induction Ventilation: Mask ventilation without difficulty LMA: LMA inserted LMA Size: 4.0 Tube type: Oral Number of attempts: 1 Airway Equipment and Method: Stylet and Oral airway Placement Confirmation: ETT inserted through vocal cords under direct vision,  positive ETCO2 and breath sounds checked- equal and bilateral Tube secured with: Tape Dental Injury: Teeth and Oropharynx as per pre-operative assessment

## 2020-12-23 NOTE — Progress Notes (Signed)
[name], Hendrix are scheduled for a virtual visit with your provider today.    Just as we do with appointments in the office, we must obtain your consent to participate.  Your consent will be active for this visit and any virtual visit you may have with one of our providers in the next 365 days.    If you have a MyChart account, I can also send a copy of this consent to you electronically.  All virtual visits are billed to your insurance company just like a traditional visit in the office.  As this is a virtual visit, video technology does not allow for your provider to perform a traditional examination.  This may limit your provider's ability to fully assess your condition.  If your provider identifies any concerns that need to be evaluated in person or the need to arrange testing such as labs, EKG, etc, we will make arrangements to do so.    Although advances in technology are sophisticated, we cannot ensure that it will always work on either your end or our end.  If the connection with a video visit is poor, we may have to switch to a telephone visit.  With either a video or telephone visit, we are not always able to ensure that we have a secure connection.   I need to obtain your verbal consent now.   Are you willing to proceed with your visit today?   Brittany Hendrix has provided verbal consent on 12/23/2020 for a virtual visit (video or telephone).   Margaretann Loveless, PA-C 12/23/2020  7:08 PM    MyChart Video Visit    Virtual Visit via Video Note   This visit type was conducted due to national recommendations for restrictions regarding the COVID-19 Pandemic (e.g. social distancing) in an effort to limit this patient's exposure and mitigate transmission in our community. This patient is at least at moderate risk for complications without adequate follow up. This format is felt to be most appropriate for this patient at this time. Physical exam was limited by quality of the video and  audio technology used for the visit.   Patient location: Home Provider location: Home office in [place]  I discussed the limitations of evaluation and management by telemedicine and the availability of in person appointments. The patient expressed understanding and agreed to proceed.  Patient: Brittany Hendrix   DOB: 09/13/1990   30 y.o. Female  MRN: [id number] Visit Date: 12/23/2020  Today's healthcare provider: Margaretann Loveless, PA-C   No chief complaint on file.  Subjective    HPI  Brittany Hendrix is a 30 yr old female that presents today via [employer] for a work note. She was seen today by her GYN and underwent a D&C procedure. She forgot to ask for a work note while she was there.  Patient Active Problem List   Diagnosis Date Noted  . Encounter for induction of labor 05/20/2020  . Polyhydramnios affecting pregnancy 03/27/2020  . Obesity during pregnancy, antepartum 01/02/2020  . Supervision of high risk pregnancy, antepartum 11/07/2019  . 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 Nonintractable epilepsy without status epilepticus (HCC) 04/17/2017  . Obesity (BMI 35.0-39.9 without comorbidity) 12/25/2015  . Seizure disorder in pregnancy, antepartum (HCC) 05/28/2015  . 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 Abortion history   . Headache(784.0)   . Seizures [facility])    Last seizure March 2020  Medications: Outpatient Medications Prior to Visit  Medication Sig  . oxyCODONE-acetaminophen (PERCOCET/ROXICET) 5-325 MG tablet Take 1-2 tablets by mouth every 6 (six) hours as needed.   Facility-Administered Medications Prior to Visit  Medication Dose Route Frequency Provider  . acetaminophen (OFIRMEV) IV 1,000 mg  1,000 mg Intravenous Once PRN Stoltzfus, Gregory P, DO  . fentaNYL (SUBLIMAZE) 100 MCG/2ML injection       . fentaNYL (SUBLIMAZE) injection 25-50 mcg  25-50 mcg Intravenous Q5 min PRN  Stoltzfus, Nelle Don, DO  . lactated ringers infusion   Intravenous Continuous Adam Phenix, MD  . promethazine (PHENERGAN) injection 6.25-12.5 mg  6.25-12.5 mg Intravenous Q15 min PRN Stoltzfus, Nelle Don, DO    Review of Systems  Constitutional: Negative.   Respiratory: Negative.   Cardiovascular: Negative.   Genitourinary: Positive for pelvic pain.    Last CBC Lab Results  Component Value Date   WBC 7.5 11/23/2020   HGB 11.7 (L) 11/23/2020   HCT 35.1 (L) 11/23/2020   MCV 95.4 11/23/2020   MCH 31.8 11/23/2020   RDW 12.1 11/23/2020   PLT 245 11/23/2020   Last metabolic panel Lab Results  Component Value Date   GLUCOSE 94 10/07/2019   NA 137 10/07/2019   K 3.6 10/07/2019   CL 105 10/07/2019   CO2 23 10/07/2019   BUN 10 10/07/2019   CREATININE 0.57 05/21/2020   GFRNONAA >60 05/21/2020   GFRAA >60 10/07/2019   CALCIUM 9.0 10/07/2019   PROT 6.5 10/07/2019   ALBUMIN 3.7 10/07/2019   BILITOT 0.5 10/07/2019   ALKPHOS 53 10/07/2019   AST 16 10/07/2019   ALT 19 10/07/2019   ANIONGAP 9 10/07/2019      Objective    LMP 09/30/2020  BP Readings from Last 3 Encounters:  12/23/20 (!) 91/56  12/22/20 117/74  12/07/20 (!) 90/58   Wt Readings from Last 3 Encounters:  12/23/20 214 lb (97.1 kg)  12/22/20 214 lb 11.2 oz (97.4 kg)  12/07/20 214 lb (97.1 kg)      Physical Exam Vitals reviewed.  Constitutional:      General: She is not in acute distress.    Appearance: Normal appearance. She is well-developed. She is not ill-appearing.  HENT:     Head: Normocephalic and atraumatic.  Pulmonary:     Effort: Pulmonary effort is normal. No respiratory distress.  Musculoskeletal:     Cervical back: Normal range of motion and neck supple.  Neurological:     Mental Status: She is alert.  Psychiatric:        Behavior: Behavior normal.        Thought Content: Thought content normal.        Judgment: Judgment normal.        Assessment & Plan     1. Encounter to  obtain excuse from work - Advised that through [employer] I could only provide a note for 48 hours, but that would give her time to reach back out to [facility] for her full note as she also has lifting restrictions as well.  - Work note provided through 12/25/20.   No follow-ups on file.     I discussed the assessment and treatment plan with the patient. The patient was provided an opportunity to ask questions and all were answered. The patient agreed with the plan and demonstrated an understanding of the instructions.   The patient was advised to call back or seek an in-person evaluation if the symptoms worsen  or if the condition fails to improve as anticipated.  I provided 6 minutes of face-to-face time during this encounter via MyChart Video enabled encounter.   [facility] [phone] (phone) [phone] (fax)  [facility]

## 2020-12-23 NOTE — Transfer of Care (Signed)
Immediate Anesthesia Transfer of Care Note  Patient: LYNELL GREENHOUSE  Procedure(s) Performed: DILATATION AND EVACUATION (N/A ) OPERATIVE ULTRASOUND (N/A )  Patient Location: PACU  Anesthesia Type:General  Level of Consciousness: awake, alert  and oriented  Airway & Oxygen Therapy: Patient Spontanous Breathing  Post-op Assessment: Report given to RN and Post -op Vital signs reviewed and stable  Post vital signs: stable  Last Vitals:  Vitals Value Taken Time  BP 107/64 12/23/20 1325  Temp 36.2 C 12/23/20 1325  Pulse 72 12/23/20 1327  Resp 15 12/23/20 1327  SpO2 100 % 12/23/20 1327  Vitals shown include unvalidated device data.  Last Pain:  Vitals:   12/23/20 1118  TempSrc:   PainSc: 4       Patients Stated Pain Goal: 0 (12/23/20 1118)  Complications: No complications documented.

## 2020-12-23 NOTE — Anesthesia Postprocedure Evaluation (Signed)
Anesthesia Post Note  Patient: Brittany Hendrix  Procedure(s) Performed: DILATATION AND EVACUATION (N/A ) OPERATIVE ULTRASOUND (N/A )     Patient location during evaluation: PACU Anesthesia Type: General Level of consciousness: awake and alert Pain management: pain level controlled Vital Signs Assessment: post-procedure vital signs reviewed and stable Respiratory status: spontaneous breathing, nonlabored ventilation, respiratory function stable and patient connected to nasal cannula oxygen Cardiovascular status: blood pressure returned to baseline and stable Postop Assessment: no apparent nausea or vomiting Anesthetic complications: no   No complications documented.  Last Vitals:  Vitals:   12/23/20 1410 12/23/20 1425  BP: (!) 90/50 (!) 91/56  Pulse: (!) 56 (!) 49  Resp: 20 18  Temp:  36.7 C  SpO2: 99% 99%    Last Pain:  Vitals:   12/23/20 1345  TempSrc:   PainSc: 7                  Danaysia Rader P Jair Lindblad

## 2020-12-23 NOTE — Anesthesia Preprocedure Evaluation (Addendum)
Anesthesia Evaluation  Patient identified by MRN, date of birth, ID band Patient awake    Reviewed: Allergy & Precautions, NPO status , Patient's Chart, lab work & pertinent test results  Airway Mallampati: II  TM Distance: >3 FB Neck ROM: Full    Dental  (+) Teeth Intact   Pulmonary neg pulmonary ROS, Current Smoker,    Pulmonary exam normal        Cardiovascular negative cardio ROS   Rhythm:Regular Rate:Normal     Neuro/Psych  Headaches, Seizures -, Well Controlled,  negative psych ROS   GI/Hepatic negative GI ROS, Neg liver ROS,   Endo/Other  negative endocrine ROS  Renal/GU negative Renal ROS  negative genitourinary   Musculoskeletal   Abdominal (+)  Abdomen: soft. Bowel sounds: normal.  Peds  Hematology negative hematology ROS (+)   Anesthesia Other Findings   Reproductive/Obstetrics (+) Pregnancy Molar pregnancy                             Anesthesia Physical Anesthesia Plan  ASA: III  Anesthesia Plan: General   Post-op Pain Management:    Induction: Intravenous  PONV Risk Score and Plan: 2 and Ondansetron, Dexamethasone, Midazolam and Treatment may vary due to age or medical condition  Airway Management Planned: Mask and LMA  Additional Equipment: None  Intra-op Plan:   Post-operative Plan: Extubation in OR  Informed Consent: I have reviewed the patients History and Physical, chart, labs and discussed the procedure including the risks, benefits and alternatives for the proposed anesthesia with the patient or authorized representative who has indicated his/her understanding and acceptance.     Dental advisory given  Plan Discussed with: CRNA  Anesthesia Plan Comments: (Lab Results      Component                Value               Date                      WBC                      7.5                 11/23/2020                HGB                      11.7 (L)             11/23/2020                HCT                      35.1 (L)            11/23/2020                MCV                      95.4                11/23/2020                PLT                      245  11/23/2020           Lab Results      Component                Value               Date                      NA                       137                 10/07/2019                K                        3.6                 10/07/2019                CO2                      23                  10/07/2019                GLUCOSE                  94                  10/07/2019                BUN                      10                  10/07/2019                CREATININE               0.57                05/21/2020                CALCIUM                  9.0                 10/07/2019                GFRNONAA                 >60                 05/21/2020                GFRAA                    >60                 10/07/2019          )       Anesthesia Quick Evaluation

## 2020-12-23 NOTE — Op Note (Signed)
Brittany Hendrix PROCEDURE DATE: 12/23/2020  PREOPERATIVE DIAGNOSIS: suspected molar pregnancy POSTOPERATIVE DIAGNOSIS: The same PROCEDURE:     Dilation and Evacuation under ultrasound guidance in the OR SURGEON:  Scheryl Darter MD  INDICATIONS: 30 y.o. [phone] with possible molar pregnancy, needing surgical completion.  Risks of surgery were discussed with the patient including but not limited to: bleeding which may require transfusion; infection which may require antibiotics; injury to uterus or surrounding organs; need for additional procedures including laparotomy or laparoscopy; possibility of intrauterine scarring which may impair future fertility; and other postoperative/anesthesia complications. Written informed consent was obtained.    FINDINGS:  A 8 week size uterus, moderate amounts of products of conception, specimen sent to pathology.  ANESTHESIA:    Monitored intravenous sedation INTRAVENOUS FLUIDS:  1000 ml of LR ESTIMATED BLOOD LOSS:  Less than 75 ml. SPECIMENS:  Products of conception sent to pathology COMPLICATIONS:  None immediate.  PROCEDURE DETAILS:  The patient received intravenous Doxycycline while in the preoperative area.  She was then taken to the operating room where monitored intravenous sedation was administered and was found to be adequate.  After an adequate timeout was performed, she was placed in the dorsal lithotomy position and examined; then prepped and draped in the sterile manner.   Her bladder was catheterized for an unmeasured amount of clear, yellow urine. A vaginal speculum was then placed in the patient's vagina and a single tooth tenaculum was applied to the anterior lip of the cervix. The cervix was gently dilated to accommodate a 10 mm suction curette that was gently advanced to the uterine fundus.  The suction device was then activated and curette slowly rotated to clear the uterus of products of conception. Intraoperative [place] was employed and no evidence  of retained tissue was seen. There was minimal bleeding noted and the tenaculum removed with good hemostasis noted.   All instruments were removed from the patient's vagina.  Sponge and instrument counts were correct times two  The patient tolerated the procedure well and was taken to the recovery area awake, and in stable condition.   Adam Phenix, MD 12/23/2020 1:39 PM

## 2020-12-23 NOTE — Interval H&P Note (Signed)
History and Physical Interval Note:  12/23/2020 12:14 PM  Brittany Hendrix  has presented today for surgery, with the diagnosis of Molar Pregnancy.  The various methods of treatment have been discussed with the patient and family. After consideration of risks, benefits and other options for treatment, the patient has consented to  Procedure(s): DILATATION AND EVACUATION (N/A) OPERATIVE ULTRASOUND (N/A) as a surgical intervention.  The patient's history has been reviewed, patient examined, no change in status, stable for surgery.  I have reviewed the patient's chart and labs.  Questions were answered to the patient's satisfaction.   Patient desires surgical management with D&E.  The risks of surgery were discussed in detail with the patient including but not limited to: bleeding which may require transfusion or reoperation; infection which may require prolonged hospitalization or re-hospitalization and antibiotic therapy; injury to bowel, bladder, ureters and major vessels or other surrounding organs; formation of adhesions; need for additional procedures including laparotomy or subsequent procedures secondary to abnormal pathology; thromboembolic phenomenon; incisional problems and other postoperative or anesthesia complications.  Patient was told that the likelihood that her condition and symptoms will be treated effectively with this surgical management was very high; the postoperative expectations were also discussed in detail. The patient also understands the alternative treatment options which were discussed in full. All questions were answered.    Scheryl Darter

## 2020-12-23 NOTE — Discharge Instructions (Signed)
Dilation and Curettage or Vacuum Curettage, Care After This sheet gives you information about how to care for yourself after your procedure. Your doctor may also give you more specific instructions. If you have problems or questions, contact your doctor. What can I expect after the procedure? After the procedure, it is common to have:  Mild pain or cramping.  Some bleeding or spotting from the vagina. These may last for up to 2 weeks. Follow these instructions at home: Medicines  Take over-the-counter and prescription medicines only as told by your doctor. This is very important if you take blood-thinning medicine.  Ask your doctor if the medicine prescribed to you requires you to avoid driving or using machinery. Activity  If you were given a medicine to help you relax (sedative) during your procedure, it can affect you for many hours. Do not drive or use machinery until your doctor says that it is safe.  Rest as told by your doctor.  Do not sit for a long time without moving. Get up to take short walks every 1-2 hours. This is important. Ask for help if you feel weak or unsteady.  Do not lift anything that is heavier than 10 lb (4.5 kg), or the limit that you are told, until your doctor says that it is safe.  Return to your normal activities as told by your doctor. Ask your doctor what activities are safe for you.   Lifestyle For at least 2 weeks, or as long as told by your doctor:  Do not douche.  Do not use tampons.  Do not have sex. General instructions  Wear compression stockings as told by your doctor.  It is up to you to get the results of your procedure. Ask your doctor, or the department that is doing the procedure, when your results will be ready.  Keep all follow-up visits as told by your doctor. This is important. Contact a doctor if:  You have very bad cramps that get worse or do not get better with medicine.  You have very bad pain in your belly  (abdomen).  You cannot drink fluids without vomiting.  You have pain in a different part of your pelvis. The pelvis is the area just above your thighs.  You have fluid from your vagina that smells bad.  You have a rash. Get help right away if:  You are bleeding a lot from your vagina. A lot of bleeding means soaking more than one sanitary pad in 1 hour for 2 hours in a row.  You have a fever that is above 100.4F (38.0C).  Your belly feels very tender or hard.  You have chest pain.  You have trouble breathing.  You feel dizzy.  You feel light-headed.  You pass out (faint).  You have pain in your neck or shoulder area. These symptoms may be an emergency. Do not wait to see if the symptoms will go away. Get medical help right away. Call your local emergency services (911 in the [place] Do not drive yourself to the hospital. Summary  After your procedure, it is common to have pain or cramping. It is also common to have bleeding or spotting from your vagina.  Rest as told. Do not sit for a long time without moving. Get up to take short walks every 1-2 hours.  Do not lift anything that is heavier than 10 lb (4.5 kg), or the limit that you are told.  Contact your doctor if you have fluid from   your vagina that smells bad.  Get help right away if you develop any problems from the procedure. Ask your doctor what problems to watch for. This information is not intended to replace advice given to you by your health care provider. Make sure you discuss any questions you have with your health care provider. Document Revised: 08/13/2019 Document Reviewed: 08/13/2019 Elsevier Patient Education  2021 [employer]vier [employer].  

## 2020-12-24 ENCOUNTER — Encounter (HOSPITAL_COMMUNITY): Payer: Self-pay | Admitting: Obstetrics & Gynecology

## 2020-12-24 LAB — SURGICAL PATHOLOGY

## 2020-12-25 ENCOUNTER — Other Ambulatory Visit: Payer: Self-pay

## 2020-12-25 ENCOUNTER — Emergency Department (HOSPITAL_COMMUNITY): Payer: Medicaid Other

## 2020-12-25 ENCOUNTER — Emergency Department (HOSPITAL_COMMUNITY)
Admission: EM | Admit: 2020-12-25 | Discharge: 2020-12-25 | Disposition: A | Discharge status: Home / Self Care | Payer: Medicaid Other | Attending: Emergency Medicine | Admitting: Emergency Medicine

## 2020-12-25 DIAGNOSIS — W3400XA Accidental discharge from unspecified firearms or gun, initial encounter: Secondary | ICD-10-CM | POA: Diagnosis not present

## 2020-12-25 DIAGNOSIS — H1131 Conjunctival hemorrhage, right eye: Secondary | ICD-10-CM | POA: Diagnosis not present

## 2020-12-25 DIAGNOSIS — Y9389 Activity, other specified: Secondary | ICD-10-CM | POA: Insufficient documentation

## 2020-12-25 DIAGNOSIS — S01301A Unspecified open wound of right ear, initial encounter: Secondary | ICD-10-CM

## 2020-12-25 DIAGNOSIS — S01401A Unspecified open wound of right cheek and temporomandibular area, initial encounter: Secondary | ICD-10-CM | POA: Diagnosis not present

## 2020-12-25 DIAGNOSIS — S0990XA Unspecified injury of head, initial encounter: Secondary | ICD-10-CM | POA: Diagnosis present

## 2020-12-25 DIAGNOSIS — Z23 Encounter for immunization: Secondary | ICD-10-CM | POA: Insufficient documentation

## 2020-12-25 DIAGNOSIS — S0191XA Laceration without foreign body of unspecified part of head, initial encounter: Secondary | ICD-10-CM

## 2020-12-25 LAB — CBC WITH DIFFERENTIAL/PLATELET
Abs Immature Granulocytes: 0.04 10*3/uL (ref 0.00–0.07)
Basophils Absolute: 0 10*3/uL (ref 0.0–0.1)
Basophils Relative: 0 %
Eosinophils Absolute: 0.1 10*3/uL (ref 0.0–0.5)
Eosinophils Relative: 0 %
HCT: 34.5 % — ABNORMAL LOW (ref 36.0–46.0)
Hemoglobin: 11.6 g/dL — ABNORMAL LOW (ref 12.0–15.0)
Immature Granulocytes: 0 %
Lymphocytes Relative: 36 %
Lymphs Abs: 4.3 10*3/uL — ABNORMAL HIGH (ref 0.7–4.0)
MCH: 32.7 pg (ref 26.0–34.0)
MCHC: 33.6 g/dL (ref 30.0–36.0)
MCV: 97.2 fL (ref 80.0–100.0)
Monocytes Absolute: 0.6 10*3/uL (ref 0.1–1.0)
Monocytes Relative: 5 %
Neutro Abs: 6.9 10*3/uL (ref 1.7–7.7)
Neutrophils Relative %: 59 %
Platelets: 242 10*3/uL (ref 150–400)
RBC: 3.55 MIL/uL — ABNORMAL LOW (ref 3.87–5.11)
RDW: 12.4 % (ref 11.5–15.5)
WBC: 11.9 10*3/uL — ABNORMAL HIGH (ref 4.0–10.5)
nRBC: 0 % (ref 0.0–0.2)

## 2020-12-25 LAB — BASIC METABOLIC PANEL
Anion gap: 12 (ref 5–15)
BUN: 13 mg/dL (ref 6–20)
CO2: 22 mmol/L (ref 22–32)
Calcium: 8.7 mg/dL — ABNORMAL LOW (ref 8.9–10.3)
Chloride: 105 mmol/L (ref 98–111)
Creatinine, Ser: 0.88 mg/dL (ref 0.44–1.00)
GFR, Estimated: >60 mL/min (ref >60)
Glucose, Bld: 97 mg/dL (ref 70–99)
Potassium: 3.2 mmol/L — ABNORMAL LOW (ref 3.5–5.1)
Sodium: 139 mmol/L (ref 135–145)

## 2020-12-25 LAB — I-STAT BETA HCG BLOOD, ED (MC, WL, AP ONLY): I-stat hCG, quantitative: 717.1 m[IU]/mL — ABNORMAL HIGH (ref <5)

## 2020-12-25 MED ORDER — LIDOCAINE HCL (PF) 1 % IJ SOLN
INTRAMUSCULAR | Status: AC
  Filled 2020-12-25: qty 30

## 2020-12-25 MED ORDER — LIDOCAINE-EPINEPHRINE (PF) 2 %-1:200000 IJ SOLN: 20.0000 mL | Freq: Once | INTRAMUSCULAR | Status: AC

## 2020-12-25 MED ORDER — LIDOCAINE-EPINEPHRINE (PF) 2 %-1:200000 IJ SOLN
INTRAMUSCULAR | Status: AC
  Administered 2020-12-25: 20 mL
  Filled 2020-12-25: qty 20

## 2020-12-25 MED ORDER — OXYCODONE-ACETAMINOPHEN 5-325 MG PO TABS: 1.0000 | ORAL_TABLET | Freq: Four times a day (QID) | ORAL | 0 refills | Status: DC | PRN

## 2020-12-25 MED ORDER — SODIUM CHLORIDE 0.9 % IV BOLUS
500.0000 mL | Freq: Once | INTRAVENOUS | Status: AC
  Administered 2020-12-25: 500 mL via INTRAVENOUS

## 2020-12-25 MED ORDER — CEPHALEXIN 500 MG PO CAPS: 500.0000 mg | ORAL_CAPSULE | Freq: Four times a day (QID) | ORAL | 0 refills | Status: DC

## 2020-12-25 MED ORDER — TETANUS-DIPHTH-ACELL PERTUSSIS 5-2.5-18.5 LF-MCG/0.5 IM SUSY
0.5000 mL | PREFILLED_SYRINGE | Freq: Once | INTRAMUSCULAR | Status: AC
  Administered 2020-12-25: 0.5 mL via INTRAMUSCULAR
  Filled 2020-12-25: qty 0.5

## 2020-12-25 NOTE — ED Notes (Signed)
Back from CT, LEO questioning pt
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
## 2020-12-25 NOTE — ED Notes (Signed)
RN covered wounds and educated pt on wound care.

## 2020-12-25 NOTE — ED Notes (Signed)
Pt to CT

## 2020-12-25 NOTE — ED Notes (Signed)
Pt able to ambulate to the bathroom w/ standby assist only.

## 2020-12-25 NOTE — Discharge Instructions (Addendum)
Local wound care with bacitracin and dressing changes once daily.  Begin taking Keflex as prescribed.  Begin taking Percocet as prescribed as needed for pain.  You are to follow-up with Dr. Ulice Bold in the plastic surgery clinic in 3 days.  Her contact information has been provided in this discharge summary for you to call this morning and make these arrangements.

## 2020-12-25 NOTE — ED Notes (Signed)
RN was cleaning room and found pt's piercing.  Called and left message on pt's phone.

## 2021-01-01 ENCOUNTER — Encounter: Payer: Self-pay | Admitting: Plastic Surgery

## 2021-01-01 ENCOUNTER — Other Ambulatory Visit: Payer: Self-pay

## 2021-01-01 ENCOUNTER — Ambulatory Visit (INDEPENDENT_AMBULATORY_CARE_PROVIDER_SITE_OTHER): Payer: Medicaid Other | Admitting: Plastic Surgery

## 2021-01-01 DIAGNOSIS — W3400XA Accidental discharge from unspecified firearms or gun, initial encounter: Secondary | ICD-10-CM | POA: Diagnosis not present

## 2021-01-01 DIAGNOSIS — S0191XA Laceration without foreign body of unspecified part of head, initial encounter: Secondary | ICD-10-CM
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
## 2021-01-08 ENCOUNTER — Ambulatory Visit (INDEPENDENT_AMBULATORY_CARE_PROVIDER_SITE_OTHER): Payer: Medicaid Other | Admitting: Surgical

## 2021-01-08 ENCOUNTER — Other Ambulatory Visit: Payer: Self-pay

## 2021-01-08 ENCOUNTER — Encounter: Payer: Self-pay | Admitting: Surgical

## 2021-01-08 DIAGNOSIS — S0191XA Laceration without foreign body of unspecified part of head, initial encounter: Secondary | ICD-10-CM

## 2021-01-08 DIAGNOSIS — W3400XA Accidental discharge from unspecified firearms or gun, initial encounter: Secondary | ICD-10-CM
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
## 2021-01-13 MED ORDER — LEVETIRACETAM 1000 MG PO TABS: 1000.0000 mg | ORAL_TABLET | Freq: Two times a day (BID) | ORAL | 0 refills | Status: DC

## 2021-01-13 NOTE — Telephone Encounter (Signed)
Pt states she was recently evicted from her home and is now down to a few of her Keppra.  Pt is asking if there is anyway for her to get more called in for her, please call.

## 2021-01-13 NOTE — Telephone Encounter (Signed)
Called pt back. She was evicted from her home. Keppra medication got left behind that she picked up on 12/17/20 #180. She does not have it. Needs a refill. Would like rx to go to [place]. Aware insurance may show too early for refill. Can look into goodrx if needed.

## 2021-01-13 NOTE — Addendum Note (Signed)
Addended by: Arther Abbott on: 01/13/2021 12:10 PM   Modules accepted: Orders

## 2021-01-26 ENCOUNTER — Ambulatory Visit (INDEPENDENT_AMBULATORY_CARE_PROVIDER_SITE_OTHER): Payer: Medicaid Other | Admitting: Surgical

## 2021-01-26 DIAGNOSIS — W3400XA Accidental discharge from unspecified firearms or gun, initial encounter: Secondary | ICD-10-CM

## 2021-01-26 DIAGNOSIS — S0191XA Laceration without foreign body of unspecified part of head, initial encounter: Secondary | ICD-10-CM

## 2021-01-26 NOTE — Progress Notes (Signed)
Patient did not show for appointment.   

## 2021-02-09 ENCOUNTER — Ambulatory Visit: Payer: Medicaid Other | Admitting: Surgical

## 2021-02-17 ENCOUNTER — Telehealth: Payer: Self-pay | Admitting: Neurology

## 2021-02-17 NOTE — Telephone Encounter (Signed)
7/28 OV cancelled due to NP out of office, LVM/mychart msg sent

## 2021-02-18 ENCOUNTER — Ambulatory Visit: Payer: Medicaid Other | Admitting: Neurology

## 2021-04-06 ENCOUNTER — Other Ambulatory Visit: Payer: Self-pay | Admitting: Emergency Medicine

## 2021-04-06 ENCOUNTER — Telehealth: Payer: Self-pay | Admitting: Neurology

## 2021-04-06 MED ORDER — LEVETIRACETAM 1000 MG PO TABS: 1000.0000 mg | ORAL_TABLET | Freq: Two times a day (BID) | ORAL | 0 refills | Status: DC

## 2021-04-06 NOTE — Telephone Encounter (Signed)
Pt request refill levETIRAcetam (KEPPRA) 1000 MG tablet at [facility] [phone]

## 2021-05-12 ENCOUNTER — Encounter: Payer: Self-pay | Admitting: Obstetrics

## 2021-05-12 ENCOUNTER — Ambulatory Visit (INDEPENDENT_AMBULATORY_CARE_PROVIDER_SITE_OTHER): Payer: Medicaid Other

## 2021-05-12 ENCOUNTER — Other Ambulatory Visit: Payer: Self-pay

## 2021-05-12 DIAGNOSIS — Z3201 Encounter for pregnancy test, result positive: Secondary | ICD-10-CM | POA: Diagnosis not present

## 2021-05-12 DIAGNOSIS — Z3200 Encounter for pregnancy test, result unknown: Secondary | ICD-10-CM

## 2021-05-12 LAB — POCT URINE PREGNANCY: Preg Test, Ur: POSITIVE — AB

## 2021-05-12 NOTE — Progress Notes (Signed)
..  Ms. Sampath presents today for UPT. She has no unusual complaints. LMP:04-08-21    OBJECTIVE: Appears well, in no apparent distress.  OB History     Gravida  9   Para  2   Term  2   Preterm  0   AB  5   Living  2      SAB  4   IAB  1   Ectopic  0   Multiple  0   Live Births  2          Home UPT Result: Positive In-Office UPT result: Positive I have reviewed the patient's medical, obstetrical, social, and family histories, and medications.   ASSESSMENT: Positive pregnancy test  PLAN Prenatal care to be completed at: [facility] Pt has concerns with history of multiple SABs, pt requests to be seen by provider sooner than 10 weeks. S/w in office provider, and advised scheduler of approval.

## 2021-05-12 NOTE — Progress Notes (Signed)
Patient was assessed and managed by nursing staff during this encounter. I have reviewed the chart and agree with the documentation and plan. I have also made any necessary editorial changes.  Lamia Mariner A Amrita Radu, MD 05/12/2021 9:09 PM   

## 2021-05-22 ENCOUNTER — Inpatient Hospital Stay (HOSPITAL_COMMUNITY): Payer: Medicaid Other

## 2021-05-22 ENCOUNTER — Encounter (HOSPITAL_COMMUNITY): Payer: Self-pay | Admitting: Obstetrics & Gynecology

## 2021-05-22 ENCOUNTER — Other Ambulatory Visit: Payer: Self-pay

## 2021-05-22 ENCOUNTER — Inpatient Hospital Stay (HOSPITAL_COMMUNITY)
Admission: AD | Admit: 2021-05-22 | Discharge: 2021-05-22 | Disposition: A | Discharge status: Home / Self Care | Payer: Medicaid Other | Attending: Obstetrics & Gynecology | Admitting: Obstetrics & Gynecology

## 2021-05-22 DIAGNOSIS — Z3A01 Less than 8 weeks gestation of pregnancy: Secondary | ICD-10-CM | POA: Insufficient documentation

## 2021-05-22 DIAGNOSIS — R109 Unspecified abdominal pain: Secondary | ICD-10-CM | POA: Diagnosis not present

## 2021-05-22 DIAGNOSIS — R1030 Lower abdominal pain, unspecified: Secondary | ICD-10-CM | POA: Insufficient documentation

## 2021-05-22 DIAGNOSIS — O99331 Smoking (tobacco) complicating pregnancy, first trimester: Secondary | ICD-10-CM | POA: Insufficient documentation

## 2021-05-22 DIAGNOSIS — O09291 Supervision of pregnancy with other poor reproductive or obstetric history, first trimester: Secondary | ICD-10-CM | POA: Diagnosis not present

## 2021-05-22 DIAGNOSIS — F17210 Nicotine dependence, cigarettes, uncomplicated: Secondary | ICD-10-CM | POA: Diagnosis not present

## 2021-05-22 DIAGNOSIS — Z3491 Encounter for supervision of normal pregnancy, unspecified, first trimester: Secondary | ICD-10-CM

## 2021-05-22 DIAGNOSIS — O26891 Other specified pregnancy related conditions, first trimester: Secondary | ICD-10-CM | POA: Insufficient documentation

## 2021-05-22 DIAGNOSIS — O26899 Other specified pregnancy related conditions, unspecified trimester: Secondary | ICD-10-CM

## 2021-05-22 LAB — CBC
HCT: 34.4 % — ABNORMAL LOW (ref 36.0–46.0)
Hemoglobin: 11.8 g/dL — ABNORMAL LOW (ref 12.0–15.0)
MCH: 31.6 pg (ref 26.0–34.0)
MCHC: 34.3 g/dL (ref 30.0–36.0)
MCV: 92 fL (ref 80.0–100.0)
Platelets: 256 10*3/uL (ref 150–400)
RBC: 3.74 MIL/uL — ABNORMAL LOW (ref 3.87–5.11)
RDW: 12.4 % (ref 11.5–15.5)
WBC: 7.6 10*3/uL (ref 4.0–10.5)
nRBC: 0 % (ref 0.0–0.2)

## 2021-05-22 LAB — URINALYSIS, ROUTINE W REFLEX MICROSCOPIC
Bilirubin Urine: NEGATIVE
Glucose, UA: NEGATIVE mg/dL
Hgb urine dipstick: NEGATIVE
Ketones, ur: 20 mg/dL — AB
Leukocytes,Ua: NEGATIVE
Nitrite: NEGATIVE
Protein, ur: NEGATIVE mg/dL
Specific Gravity, Urine: 1.023 (ref 1.005–1.030)
pH: 6 (ref 5.0–8.0)

## 2021-05-22 LAB — WET PREP, GENITAL
Clue Cells Wet Prep HPF POC: NONE SEEN
Sperm: NONE SEEN
Trich, Wet Prep: NONE SEEN
Yeast Wet Prep HPF POC: NONE SEEN

## 2021-05-22 LAB — HCG, QUANTITATIVE, PREGNANCY: hCG, Beta Chain, Quant, S: 18278 m[IU]/mL — ABNORMAL HIGH (ref <5)

## 2021-05-22 NOTE — Discharge Instructions (Signed)

## 2021-05-22 NOTE — MAU Note (Signed)
Called U/S to request the exam be marked "Ended" so the radiologist could read the report as the patient has been back in the room for 20 minutes. Jasmine stated she would mark it complete now.

## 2021-05-22 NOTE — MAU Provider Note (Signed)
History     CSN: [id number]  Arrival date and time: 05/22/21 0841   Event Date/Time   First Provider Initiated Contact with Patient 05/22/21 [phone]      Chief Complaint  Patient presents with   Abdominal Pain   HPI Brittany Hendrix is a 30 y.o. [id number] at [redacted]w[redacted]d who presents with lower abdominal cramping. She states she has been cramping for several weeks. She rates the pain a 5/10 and has not tried anything for the pain. She denies any bleeding or discharge. 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   OB History     Gravida  9   Para  2   Term  2   Preterm  0   AB  5   Living  2      SAB  4   IAB  1   Ectopic  0   Multiple  0   Live Births  2           Past Medical History:  Diagnosis Date   Abortion history    Headache(784.0)    Seizures (HCC)    Last seizure March 2020    Past Surgical History:  Procedure Laterality Date   CESAREAN SECTION N/A 05/20/2020   Procedure: CESAREAN SECTION;  Surgeon: Tereso Newcomer, MD;  Location: [facility] ORS;  Service: Obstetrics;  Laterality: N/A;   DILATION AND CURETTAGE OF UTERUS     abortion   DILATION AND EVACUATION N/A 10/04/2017   Procedure: DILATATION AND EVACUATION;  Surgeon: Silverio Lay, MD;  Location: [facility] ORS;  Service: Gynecology;  Laterality: N/A;   DILATION AND EVACUATION N/A 12/23/2020   Procedure: DILATATION AND EVACUATION;  Surgeon: Adam Phenix, MD;  Location: [facility] OR;  Service: Gynecology;  Laterality: N/A;   OPERATIVE ULTRASOUND N/A 12/23/2020   Procedure: OPERATIVE ULTRASOUND;  Surgeon: Adam Phenix, MD;  Location: [facility] OR;  Service: Gynecology;  Laterality: N/A;    Family History  Problem Relation Age of Onset   Diabetes Mother    Asthma Brother     Social History   Tobacco Use   Smoking status: Every Day    Packs/day: 0.50    Years: 5.00    Pack years: 2.50    Types: Cigarettes   Smokeless tobacco: Never  Vaping  Use   Vaping Use: Never used  Substance Use Topics   Alcohol use: Not Currently    Comment: occasional, not while preg   Drug use: Yes    Types: Marijuana    Comment: last used 05/15/2021 as of 05/22/2021    Allergies: No Known Allergies  Medications Prior to Admission  Medication Sig Dispense Refill Last Dose   levETIRAcetam (KEPPRA) 1000 MG tablet Take 1 tablet (1,000 mg total) by mouth 2 (two) times daily. 180 tablet 0 05/22/2021   Prenatal Vit-Fe Fumarate-FA (MULTIVITAMIN-PRENATAL) 27-0.8 MG TABS tablet Take 1 tablet by mouth daily at 12 noon.   05/22/2021   oxyCODONE-acetaminophen (PERCOCET) 5-325 MG tablet Take 1-2 tablets by mouth every 6 (six) hours as needed. (Patient not taking: Reported on 05/12/2021) 20 tablet 0     Review of Systems  Constitutional: Negative.  Negative for fatigue and fever.  HENT: Negative.    Respiratory: Negative.  Negative for shortness of breath.   Cardiovascular: Negative.  Negative for chest pain.  Gastrointestinal:  Positive for abdominal pain. Negative for constipation, diarrhea, nausea and vomiting.  Genitourinary: Negative.  Negative for dysuria, vaginal bleeding and vaginal discharge.  Neurological: Negative.  Negative for dizziness and headaches.  Physical Exam   Blood pressure (!) 116/53, pulse 80, temperature 98.2 F (36.8 C), temperature source Oral, resp. rate 16, last menstrual period 04/08/2021, SpO2 100 %, unknown if currently breastfeeding.  Physical Exam Vitals and nursing note reviewed.  Constitutional:      General: She is not in acute distress.    Appearance: She is well-developed.  HENT:     Head: Normocephalic.  Eyes:     Pupils: Pupils are equal, round, and reactive to light.  Cardiovascular:     Rate and Rhythm: Normal rate and regular rhythm.     Heart sounds: Normal heart sounds.  Pulmonary:     Effort: Pulmonary effort is normal. No respiratory distress.     Breath sounds: Normal breath sounds.  Abdominal:      General: Bowel sounds are normal. There is no distension.     Palpations: Abdomen is soft.     Tenderness: There is no abdominal tenderness.  Skin:    General: Skin is warm and dry.  Neurological:     Mental Status: She is alert and oriented to person, place, and time.  Psychiatric:        Mood and Affect: Mood normal.        Behavior: Behavior normal.        Thought Content: Thought content normal.        Judgment: Judgment normal.    MAU Course  Procedures Results for orders placed or performed during the hospital encounter of 05/22/21 (from the past 24 hour(s))  Urinalysis, Routine w reflex microscopic Urine, Clean Catch     Status: Abnormal   Collection Time: 05/22/21  8:57 AM  Result Value Ref Range   Color, Urine YELLOW YELLOW   APPearance HAZY (A) CLEAR   Specific Gravity, Urine 1.023 1.005 - 1.030   pH 6.0 5.0 - 8.0   Glucose, UA NEGATIVE NEGATIVE mg/dL   Hgb urine dipstick NEGATIVE NEGATIVE   Bilirubin Urine NEGATIVE NEGATIVE   Ketones, ur 20 (A) NEGATIVE mg/dL   Protein, ur NEGATIVE NEGATIVE mg/dL   Nitrite NEGATIVE NEGATIVE   Leukocytes,Ua NEGATIVE NEGATIVE  CBC     Status: Abnormal   Collection Time: 05/22/21  9:20 AM  Result Value Ref Range   WBC 7.6 4.0 - 10.5 K/uL   RBC 3.74 (L) 3.87 - 5.11 MIL/uL   Hemoglobin 11.8 (L) 12.0 - 15.0 g/dL   HCT 49.7 (L) 53.0 - 05.1 %   MCV 92.0 80.0 - 100.0 fL   MCH 31.6 26.0 - 34.0 pg   MCHC 34.3 30.0 - 36.0 g/dL   RDW 10.2 11.1 - 73.5 %   Platelets 256 150 - 400 K/uL   nRBC 0.0 0.0 - 0.2 %  hCG, quantitative, pregnancy     Status: Abnormal   Collection Time: 05/22/21  9:20 AM  Result Value Ref Range   hCG, Beta Chain, Quant, S 18,278 (H) <5 mIU/mL  Wet prep, genital     Status: Abnormal   Collection Time: 05/22/21  9:25 AM   Specimen: PATH Cytology Cervicovaginal Ancillary Only  Result Value Ref Range   Yeast Wet Prep HPF POC NONE SEEN NONE SEEN   Trich, Wet Prep NONE SEEN NONE SEEN   Clue Cells Wet Prep HPF POC  NONE SEEN NONE SEEN   WBC, Wet Prep HPF POC MODERATE (A) NONE SEEN  Sperm NONE SEEN     US OB Comp Less 14 Wks  Result Date: 05/22/2021 CLINICAL DATA:  Abdominal pain affecting pregnancy EXAM: OBSTETRIC <14 WK ULTRASOUND TECHNIQUE: Transabdominal ultrasound was performed for evaluation of the gestation as well as the maternal uterus and adnexal regions. COMPARISON:  None of this gestation FINDINGS: Intrauterine gestational sac: Single Yolk sac:  Visualized. Embryo:  Visualized. Cardiac Activity: Visualized. Heart Rate: 125 mm bpm average CRL:   4.1 mm   6 w 1 d                  [place] EDC: 01/14/2022 Subchorionic hemorrhage:  None visualized. Maternal uterus/adnexae: No pathologic finding IMPRESSION: Single living intrauterine pregnancy measuring 6 weeks 1 day. No pathologic finding. Electronically Signed   By: Tiburcio Pea M.D.   On: 05/22/2021 10:59     MDM UA, UPT CBC, HCG ABO/Rh- A Pos  Wet prep and gc/chlamydia US OB Comp Less 14 weeks with Transvaginal   Assessment and Plan   1. Normal intrauterine pregnancy on prenatal ultrasound in first trimester   2. Abdominal pain affecting pregnancy   3. [redacted] weeks gestation of pregnancy    -Discharge home in stable condition -First trimester precautions discussed -Patient advised to follow-up with [facility] as scheduled to establish prenatal care -Patient may return to MAU as needed or if her condition were to change or worsen   Rolm Bookbinder CNM 05/22/2021, 9:23 AM

## 2021-05-22 NOTE — MAU Note (Signed)
Pt. Reports to mau with lower abd pain that has been on going for the past 2 weeks that worsened last night.  Pt denies vag bleeding, or irritation.  Denies urinary complaints

## 2021-05-22 NOTE — MAU Note (Signed)
Called Lab to check on status of Lab work. Lab stated they would run the bloodwork now.

## 2021-05-24 LAB — GC/CHLAMYDIA PROBE AMP (~~LOC~~) NOT AT ARMC
Chlamydia: NEGATIVE
Comment: NEGATIVE
Comment: NORMAL
Neisseria Gonorrhea: NEGATIVE

## 2021-06-09 ENCOUNTER — Ambulatory Visit: Payer: Medicaid Other

## 2021-06-16 ENCOUNTER — Ambulatory Visit (INDEPENDENT_AMBULATORY_CARE_PROVIDER_SITE_OTHER): Payer: Medicaid Other | Admitting: Obstetrics and Gynecology

## 2021-06-16 ENCOUNTER — Other Ambulatory Visit: Payer: Self-pay

## 2021-06-16 ENCOUNTER — Encounter: Payer: Self-pay | Admitting: Obstetrics and Gynecology

## 2021-06-16 ENCOUNTER — Other Ambulatory Visit (HOSPITAL_COMMUNITY)
Admission: RE | Admit: 2021-06-16 | Discharge: 2021-06-16 | Disposition: A | Discharge status: Home / Self Care | Payer: Medicaid Other | Source: Ambulatory Visit | Attending: Obstetrics and Gynecology | Admitting: Obstetrics and Gynecology

## 2021-06-16 VITALS — BP 105/70 | HR 65 | Wt 226.0 lb

## 2021-06-16 DIAGNOSIS — G40909 Epilepsy, unspecified, not intractable, without status epilepticus: Secondary | ICD-10-CM

## 2021-06-16 DIAGNOSIS — Z8759 Personal history of other complications of pregnancy, childbirth and the puerperium: Secondary | ICD-10-CM

## 2021-06-16 DIAGNOSIS — O99351 Diseases of the nervous system complicating pregnancy, first trimester: Secondary | ICD-10-CM

## 2021-06-16 DIAGNOSIS — Z3A09 9 weeks gestation of pregnancy: Secondary | ICD-10-CM | POA: Diagnosis not present

## 2021-06-16 DIAGNOSIS — O0990 Supervision of high risk pregnancy, unspecified, unspecified trimester: Secondary | ICD-10-CM | POA: Diagnosis not present

## 2021-06-16 DIAGNOSIS — O0991 Supervision of high risk pregnancy, unspecified, first trimester: Secondary | ICD-10-CM

## 2021-06-16 DIAGNOSIS — O99210 Obesity complicating pregnancy, unspecified trimester: Secondary | ICD-10-CM | POA: Diagnosis not present

## 2021-06-16 DIAGNOSIS — O34219 Maternal care for unspecified type scar from previous cesarean delivery: Secondary | ICD-10-CM

## 2021-06-16 MED ORDER — ASPIRIN EC 81 MG PO TBEC: 81.0000 mg | DELAYED_RELEASE_TABLET | Freq: Every day | ORAL | 2 refills | Status: DC

## 2021-06-16 NOTE — Patient Instructions (Signed)
First Trimester of Pregnancy °The first trimester of pregnancy starts on the first day of your last menstrual period until the end of week 12. This is months 1 through 3 of pregnancy. A week after a sperm fertilizes an egg, the egg will implant into the wall of the uterus and begin to develop into a baby. By the end of 12 weeks, all the baby's organs will be formed and the baby will be 2-3 inches in size. °Body changes during your first trimester °Your body goes through many changes during pregnancy. The changes vary and generally return to normal after your baby is born. °Physical changes °You may gain or lose weight. °Your breasts may begin to grow larger and become tender. The tissue that surrounds your nipples (areola) may become darker. °Dark spots or blotches (chloasma or mask of pregnancy) may develop on your face. °You may have changes in your hair. These can include thickening or thinning of your hair or changes in texture. °Health changes °You may feel nauseous, and you may vomit. °You may have heartburn. °You may develop headaches. °You may develop constipation. °Your gums may bleed and may be sensitive to brushing and flossing. °Other changes °You may tire easily. °You may urinate more often. °Your menstrual periods will stop. °You may have a loss of appetite. °You may develop cravings for certain kinds of food. °You may have changes in your emotions from day to day. °You may have more vivid and strange dreams. °Follow these instructions at home: °Medicines °Follow your health care provider's instructions regarding medicine use. Specific medicines may be either safe or unsafe to take during pregnancy. Do not take any medicines unless told to by your health care provider. °Take a prenatal vitamin that contains at least 600 micrograms (mcg) of folic acid. °Eating and drinking °Eat a healthy diet that includes fresh fruits and vegetables, whole grains, good sources of protein such as meat, eggs, or tofu,  and low-fat dairy products. °Avoid raw meat and unpasteurized juice, milk, and cheese. These carry germs that can harm you and your baby. °If you feel nauseous or you vomit: °Eat 4 or 5 small meals a day instead of 3 large meals. °Try eating a few soda crackers. °Drink liquids between meals instead of during meals. °You may need to take these actions to prevent or treat constipation: °Drink enough fluid to keep your urine pale yellow. °Eat foods that are high in fiber, such as beans, whole grains, and fresh fruits and vegetables. °Limit foods that are high in fat and processed sugars, such as fried or sweet foods. °Activity °Exercise only as directed by your health care provider. Most people can continue their usual exercise routine during pregnancy. Try to exercise for 30 minutes at least 5 days a week. °Stop exercising if you develop pain or cramping in the lower abdomen or lower back. °Avoid exercising if it is very hot or humid or if you are at high altitude. °Avoid heavy lifting. °If you choose to, you may have sex unless your health care provider tells you not to. °Relieving pain and discomfort °Wear a good support bra to relieve breast tenderness. °Rest with your legs elevated if you have leg cramps or low back pain. °If you develop bulging veins (varicose veins) in your legs: °Wear support hose as told by your health care provider. °Elevate your feet for 15 minutes, 3-4 times a day. °Limit salt in your diet. °Safety °Wear your seat belt at all times when driving   or riding in a car. °Talk with your health care provider if someone is verbally or physically abusive to you. °Talk with your health care provider if you are feeling sad or have thoughts of hurting yourself. °Lifestyle °Do not use hot tubs, steam rooms, or saunas. °Do not douche. Do not use tampons or scented sanitary pads. °Do not use herbal remedies, alcohol, illegal drugs, or medicines that are not approved by your health care provider. Chemicals  in these products can harm your baby. °Do not use any products that contain nicotine or tobacco, such as cigarettes, e-cigarettes, and chewing tobacco. If you need help quitting, ask your health care provider. °Avoid cat litter boxes and soil used by cats. These carry germs that can cause birth defects in the baby and possibly loss of the unborn baby (fetus) by miscarriage or stillbirth. °General instructions °During routine prenatal visits in the first trimester, your health care provider will do a physical exam, perform necessary tests, and ask you how things are going. Keep all follow-up visits. This is important. °Ask for help if you have counseling or nutritional needs during pregnancy. Your health care provider can offer advice or refer you to specialists for help with various needs. °Schedule a dentist appointment. At home, brush your teeth with a soft toothbrush. Floss gently. °Write down your questions. Take them to your prenatal visits. °Where to find more information °American Pregnancy Association: americanpregnancy.org °American College of Obstetricians and Gynecologists: acog.org/en/Womens%20Health/Pregnancy °Office on Women's Health: womenshealth.gov/pregnancy °Contact a health care provider if you have: °Dizziness. °A fever. °Mild pelvic cramps, pelvic pressure, or nagging pain in the abdominal area. °Nausea, vomiting, or diarrhea that lasts for 24 hours or longer. °A bad-smelling vaginal discharge. °Pain when you urinate. °Known exposure to a contagious illness, such as chickenpox, measles, Zika virus, HIV, or hepatitis. °Get help right away if you have: °Spotting or bleeding from your vagina. °Severe abdominal cramping or pain. °Shortness of breath or chest pain. °Any kind of trauma, such as from a fall or a car crash. °New or increased pain, swelling, or redness in an arm or leg. °Summary °The first trimester of pregnancy starts on the first day of your last menstrual period until the end of week  12 (months 1 through 3). °Eating 4 or 5 small meals a day rather than 3 large meals may help to relieve nausea and vomiting. °Do not use any products that contain nicotine or tobacco, such as cigarettes, e-cigarettes, and chewing tobacco. If you need help quitting, ask your health care provider. °Keep all follow-up visits. This is important. °This information is not intended to replace advice given to you by your health care provider. Make sure you discuss any questions you have with your health care provider. °Document Revised: 12/18/2019 Document Reviewed: 10/24/2019 °Elsevier Patient Education © 2022 [employer] ° °Second Trimester of Pregnancy °The second trimester of pregnancy is from week 13 through week 27. This is months 4 through 6 of pregnancy. The second trimester is often a time when you feel your best. Your body has adjusted to being pregnant, and you begin to feel better physically. °During the second trimester: °Morning sickness has lessened or stopped completely. °You may have more energy. °You may have an increase in appetite. °The second trimester is also a time when the unborn baby (fetus) is growing rapidly. At the end of the sixth month, the fetus may be up to 12 inches long and weigh about 1½ pounds. You will likely begin to   feel the baby move (quickening) between 16 and 20 weeks of pregnancy. °Body changes during your second trimester °Your body continues to go through many changes during your second trimester. The changes vary and generally return to normal after the baby is born. °Physical changes °Your weight will continue to increase. You will notice your lower abdomen bulging out. °You may begin to get stretch marks on your hips, abdomen, and breasts. °Your breasts will continue to grow and to become tender. °Dark spots or blotches (chloasma or mask of pregnancy) may develop on your face. °A dark line from your belly button to the pubic area (linea nigra) may appear. °You may have  changes in your hair. These can include thickening of your hair, rapid growth, and changes in texture. Some people also have hair loss during or after pregnancy, or hair that feels dry or thin. °Health changes °You may develop headaches. °You may have heartburn. °You may develop constipation. °You may develop hemorrhoids or swollen, bulging veins (varicose veins). °Your gums may bleed and may be sensitive to brushing and flossing. °You may urinate more often because the fetus is pressing on your bladder. °You may have back pain. This is caused by: °Weight gain. °Pregnancy hormones that are relaxing the joints in your pelvis. °A shift in weight and the muscles that support your balance. °Follow these instructions at home: °Medicines °Follow your health care provider's instructions regarding medicine use. Specific medicines may be either safe or unsafe to take during pregnancy. Do not take any medicines unless approved by your health care provider. °Take a prenatal vitamin that contains at least 600 micrograms (mcg) of folic acid. °Eating and drinking °Eat a healthy diet that includes fresh fruits and vegetables, whole grains, good sources of protein such as meat, eggs, or tofu, and low-fat dairy products. °Avoid raw meat and unpasteurized juice, milk, and cheese. These carry germs that can harm you and your baby. °You may need to take these actions to prevent or treat constipation: °Drink enough fluid to keep your urine pale yellow. °Eat foods that are high in fiber, such as beans, whole grains, and fresh fruits and vegetables. °Limit foods that are high in fat and processed sugars, such as fried or sweet foods. °Activity °Exercise only as directed by your health care provider. Most people can continue their usual exercise routine during pregnancy. Try to exercise for 30 minutes at least 5 days a week. Stop exercising if you develop contractions in your uterus. °Stop exercising if you develop pain or cramping in the  lower abdomen or lower back. °Avoid exercising if it is very hot or humid or if you are at a high altitude. °Avoid heavy lifting. °If you choose to, you may have sex unless your health care provider tells you not to. °Relieving pain and discomfort °Wear a supportive bra to prevent discomfort from breast tenderness. °Take warm sitz baths to soothe any pain or discomfort caused by hemorrhoids. Use hemorrhoid cream if your health care provider approves. °Rest with your legs raised (elevated) if you have leg cramps or low back pain. °If you develop varicose veins: °Wear support hose as told by your health care provider. °Elevate your feet for 15 minutes, 3-4 times a day. °Limit salt in your diet. °Safety °Wear your seat belt at all times when driving or riding in a car. °Talk with your health care provider if someone is verbally or physically abusive to you. °Lifestyle °Do not use hot tubs, steam rooms, or saunas. °  Do not douche. Do not use tampons or scented sanitary pads. °Avoid cat litter boxes and soil used by cats. These carry germs that can cause birth defects in the baby and possibly loss of the fetus by miscarriage or stillbirth. °Do not use herbal remedies, alcohol, illegal drugs, or medicines that are not approved by your health care provider. Chemicals in these products can harm your baby. °Do not use any products that contain nicotine or tobacco, such as cigarettes, e-cigarettes, and chewing tobacco. If you need help quitting, ask your health care provider. °General instructions °During a routine prenatal visit, your health care provider will do a physical exam and other tests. He or she will also discuss your overall health. Keep all follow-up visits. This is important. °Ask your health care provider for a referral to a local prenatal education class. °Ask for help if you have counseling or nutritional needs during pregnancy. Your health care provider can offer advice or refer you to specialists for help  with various needs. °Where to find more information °American Pregnancy Association: americanpregnancy.org °American College of Obstetricians and Gynecologists: acog.org/en/Womens%20Health/Pregnancy °Office on Women's Health: womenshealth.gov/pregnancy °Contact a health care provider if you have: °A headache that does not go away when you take medicine. °Vision changes or you see spots in front of your eyes. °Mild pelvic cramps, pelvic pressure, or nagging pain in the abdominal area. °Persistent nausea, vomiting, or diarrhea. °A bad-smelling vaginal discharge or foul-smelling urine. °Pain when you urinate. °Sudden or extreme swelling of your face, hands, ankles, feet, or legs. °A fever. °Get help right away if you: °Have fluid leaking from your vagina. °Have spotting or bleeding from your vagina. °Have severe abdominal cramping or pain. °Have difficulty breathing. °Have chest pain. °Have fainting spells. °Have not felt your baby move for the time period told by your health care provider. °Have new or increased pain, swelling, or redness in an arm or leg. °Summary °The second trimester of pregnancy is from week 13 through week 27 (months 4 through 6). °Do not use herbal remedies, alcohol, illegal drugs, or medicines that are not approved by your health care provider. Chemicals in these products can harm your baby. °Exercise only as directed by your health care provider. Most people can continue their usual exercise routine during pregnancy. °Keep all follow-up visits. This is important. °This information is not intended to replace advice given to you by your health care provider. Make sure you discuss any questions you have with your health care provider. °Document Revised: 12/18/2019 Document Reviewed: 10/24/2019 °Elsevier Patient Education © 2022 [employer] ° °Contraception Choices °Contraception, also called birth control, refers to methods or devices that prevent pregnancy. °Hormonal methods °Contraceptive  implant °A contraceptive implant is a thin, plastic tube that contains a hormone that prevents pregnancy. It is different from an intrauterine device (IUD). It is inserted into the upper part of the arm by a health care provider. Implants can be effective for up to 3 years. °Progestin-only injections °Progestin-only injections are injections of progestin, a synthetic form of the hormone progesterone. They are given every 3 months by a health care provider. °Birth control pills °Birth control pills are pills that contain hormones that prevent pregnancy. They must be taken once a day, preferably at the same time each day. A prescription is needed to use this method of contraception. °Birth control patch °The birth control patch contains hormones that prevent pregnancy. It is placed on the skin and must be changed once a week for   three weeks and removed on the fourth week. A prescription is needed to use this method of contraception. °Vaginal ring °A vaginal ring contains hormones that prevent pregnancy. It is placed in the vagina for three weeks and removed on the fourth week. After that, the process is repeated with a new ring. A prescription is needed to use this method of contraception. °Emergency contraceptive °Emergency contraceptives prevent pregnancy after unprotected sex. They come in pill form and can be taken up to 5 days after sex. They work best the sooner they are taken after having sex. Most emergency contraceptives are available without a prescription. This method should not be used as your only form of birth control. °Barrier methods °Female condom °A female condom is a thin sheath that is worn over the penis during sex. Condoms keep sperm from going inside a woman's body. They can be used with a sperm-killing substance (spermicide) to increase their effectiveness. They should be thrown away after one use. °Female condom °A female condom is a soft, loose-fitting sheath that is put into the vagina before  sex. The condom keeps sperm from going inside a woman's body. They should be thrown away after one use. °Diaphragm °A diaphragm is a soft, dome-shaped barrier. It is inserted into the vagina before sex, along with a spermicide. The diaphragm blocks sperm from entering the uterus, and the spermicide kills sperm. A diaphragm should be left in the vagina for 6-8 hours after sex and removed within 24 hours. °A diaphragm is prescribed and fitted by a health care provider. A diaphragm should be replaced every 1-2 years, after giving birth, after gaining more than 15 lb (6.8 kg), and after pelvic surgery. °Cervical cap °A cervical cap is a round, soft latex or plastic cup that fits over the cervix. It is inserted into the vagina before sex, along with spermicide. It blocks sperm from entering the uterus. The cap should be left in place for 6-8 hours after sex and removed within 48 hours. A cervical cap must be prescribed and fitted by a health care provider. It should be replaced every 2 years. °Sponge °A sponge is a soft, circular piece of polyurethane foam with spermicide in it. The sponge helps block sperm from entering the uterus, and the spermicide kills sperm. To use it, you make it wet and then insert it into the vagina. It should be inserted before sex, left in for at least 6 hours after sex, and removed and thrown away within 30 hours. °Spermicides °Spermicides are chemicals that kill or block sperm from entering the cervix and uterus. They can come as a cream, jelly, suppository, foam, or tablet. A spermicide should be inserted into the vagina with an applicator at least 10-15 minutes before sex to allow time for it to work. The process must be repeated every time you have sex. Spermicides do not require a prescription. °Intrauterine contraception °Intrauterine device (IUD) °An IUD is a T-shaped device that is put in a woman's uterus. There are two types: °Hormone IUD.This type contains progestin, a synthetic  form of the hormone progesterone. This type can stay in place for 3-5 years. °Copper IUD.This type is wrapped in copper wire. It can stay in place for 10 years. °Permanent methods of contraception °Female tubal ligation °In this method, a woman's fallopian tubes are sealed, tied, or blocked during surgery to prevent eggs from traveling to the uterus. °Hysteroscopic sterilization °In this method, a small, flexible insert is placed into each fallopian tube. The   inserts cause scar tissue to form in the fallopian tubes and block them, so sperm cannot reach an egg. The procedure takes about 3 months to be effective. Another form of birth control must be used during those 3 months. °Female sterilization °This is a procedure to tie off the tubes that carry sperm (vasectomy). After the procedure, the man can still ejaculate fluid (semen). Another form of birth control must be used for 3 months after the procedure. °Natural planning methods °Natural family planning °In this method, a couple does not have sex on days when the woman could become pregnant. °Calendar method °In this method, the woman keeps track of the length of each menstrual cycle, identifies the days when pregnancy can happen, and does not have sex on those days. °Ovulation method °In this method, a couple avoids sex during ovulation. °Symptothermal method °This method involves not having sex during ovulation. The woman typically checks for ovulation by watching changes in her temperature and in the consistency of cervical mucus. °Post-ovulation method °In this method, a couple waits to have sex until after ovulation. °Where to find more information °Centers for Disease Control and Prevention: www.cdc.gov °Summary °Contraception, also called birth control, refers to methods or devices that prevent pregnancy. °Hormonal methods of contraception include implants, injections, pills, patches, vaginal rings, and emergency contraceptives. °Barrier methods of  contraception can include female condoms, female condoms, diaphragms, cervical caps, sponges, and spermicides. °There are two types of IUDs (intrauterine devices). An IUD can be put in a woman's uterus to prevent pregnancy for 3-5 years. °Permanent sterilization can be done through a procedure for males and females. Natural family planning methods involve nothaving sex on days when the woman could become pregnant. °This information is not intended to replace advice given to you by your health care provider. Make sure you discuss any questions you have with your health care provider. °Document Revised: 12/16/2019 Document Reviewed: 12/16/2019 °Elsevier Patient Education © 2022 [employer] ° °

## 2021-06-16 NOTE — Progress Notes (Signed)
Pt is here today for NOB appointment. She is not currently having any issues or concerns. PHQ 9 score: 8. Discussed with patient that she is in a "gray" area and offered referral to integrative behavioral health. Pt declines at this time. Advised patient that should she need this in the future to please let us know.
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
## 2021-06-18 LAB — URINE CULTURE, OB REFLEX

## 2021-06-18 LAB — CERVICOVAGINAL ANCILLARY ONLY
Chlamydia: NEGATIVE
Comment: NEGATIVE
Comment: NORMAL
Neisseria Gonorrhea: NEGATIVE

## 2021-06-18 LAB — CULTURE, OB URINE

## 2021-06-19 LAB — CBC/D/PLT+RPR+RH+ABO+RUBIGG...
Antibody Screen: NEGATIVE
Basophils Absolute: 0 10*3/uL (ref 0.0–0.2)
Basos: 0 %
EOS (ABSOLUTE): 0.2 10*3/uL (ref 0.0–0.4)
Eos: 4 %
HCV Ab: 0.2 s/co ratio (ref 0.0–0.9)
HIV Screen 4th Generation wRfx: NONREACTIVE
Hematocrit: 35 % (ref 34.0–46.6)
Hemoglobin: 11.9 g/dL (ref 11.1–15.9)
Hepatitis B Surface Ag: NEGATIVE
Immature Grans (Abs): 0 10*3/uL (ref 0.0–0.1)
Immature Granulocytes: 0 %
Lymphocytes Absolute: 2 10*3/uL (ref 0.7–3.1)
Lymphs: 36 %
MCH: 31.5 pg (ref 26.6–33.0)
MCHC: 34 g/dL (ref 31.5–35.7)
MCV: 93 fL (ref 79–97)
Monocytes Absolute: 0.4 10*3/uL (ref 0.1–0.9)
Monocytes: 7 %
Neutrophils Absolute: 3 10*3/uL (ref 1.4–7.0)
Neutrophils: 53 %
Platelets: 213 10*3/uL (ref 150–450)
RBC: 3.78 x10E6/uL (ref 3.77–5.28)
RDW: 12.2 % (ref 11.7–15.4)
RPR Ser Ql: NONREACTIVE
Rh Factor: POSITIVE
Rubella Antibodies, IGG: 5.25 index (ref >0.99)
WBC: 5.6 10*3/uL (ref 3.4–10.8)

## 2021-06-19 LAB — HCV INTERPRETATION

## 2021-06-19 LAB — HEMOGLOBIN A1C
Est. average glucose Bld gHb Est-mCnc: 103 mg/dL
Hgb A1c MFr Bld: 5.2 % (ref 4.8–5.6)

## 2021-07-06 ENCOUNTER — Ambulatory Visit: Payer: Medicaid Other | Admitting: Neurology

## 2021-07-06 ENCOUNTER — Encounter: Payer: Self-pay | Admitting: Neurology

## 2021-07-06 VITALS — BP 98/62 | HR 59 | Ht 64.0 in | Wt 224.0 lb

## 2021-07-06 DIAGNOSIS — Z3A12 12 weeks gestation of pregnancy: Secondary | ICD-10-CM

## 2021-07-06 DIAGNOSIS — G40909 Epilepsy, unspecified, not intractable, without status epilepticus: Secondary | ICD-10-CM

## 2021-07-06 MED ORDER — LEVETIRACETAM 1000 MG PO TABS: 1000.0000 mg | ORAL_TABLET | Freq: Two times a day (BID) | ORAL | 4 refills | Status: DC
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
## 2021-07-14 ENCOUNTER — Ambulatory Visit (INDEPENDENT_AMBULATORY_CARE_PROVIDER_SITE_OTHER): Payer: Medicaid Other | Admitting: Obstetrics and Gynecology

## 2021-07-14 ENCOUNTER — Encounter: Payer: Self-pay | Admitting: Obstetrics and Gynecology

## 2021-07-14 ENCOUNTER — Other Ambulatory Visit: Payer: Self-pay

## 2021-07-14 VITALS — BP 125/81 | HR 94 | Wt 226.0 lb

## 2021-07-14 DIAGNOSIS — O0990 Supervision of high risk pregnancy, unspecified, unspecified trimester: Secondary | ICD-10-CM

## 2021-07-14 DIAGNOSIS — G40909 Epilepsy, unspecified, not intractable, without status epilepticus: Secondary | ICD-10-CM

## 2021-07-14 DIAGNOSIS — O2620 Pregnancy care for patient with recurrent pregnancy loss, unspecified trimester: Secondary | ICD-10-CM

## 2021-07-14 DIAGNOSIS — Z8759 Personal history of other complications of pregnancy, childbirth and the puerperium: Secondary | ICD-10-CM

## 2021-07-14 DIAGNOSIS — O34219 Maternal care for unspecified type scar from previous cesarean delivery: Secondary | ICD-10-CM

## 2021-07-14 DIAGNOSIS — E669 Obesity, unspecified: Secondary | ICD-10-CM

## 2021-07-14 DIAGNOSIS — O99350 Diseases of the nervous system complicating pregnancy, unspecified trimester: Secondary | ICD-10-CM

## 2021-07-14 MED ORDER — DICLEGIS 10-10 MG PO TBEC: 2.0000 | DELAYED_RELEASE_TABLET | Freq: Every day | ORAL | 5 refills | Status: DC

## 2021-07-14 MED ORDER — ASPIRIN EC 81 MG PO TBEC: 81.0000 mg | DELAYED_RELEASE_TABLET | Freq: Every day | ORAL | 2 refills | Status: DC

## 2021-07-14 NOTE — Progress Notes (Signed)
ROB 13.[redacted] wks GA No unusual complaints. New OB labs/ urine completed last visit. Genetic screening labs for this visit.

## 2021-07-14 NOTE — Progress Notes (Signed)
Subjective:  Brittany Hendrix is a 30 y.o. [phone] at [redacted]w[redacted]d being seen today for ongoing prenatal care.  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  Patient reports no complaints.  Contractions: Not present. Vag. Bleeding: None.   . Denies leaking of fluid.   The following portions of the patient's history were reviewed and updated as appropriate: allergies, current medications, past family history, past medical history, past social history, past surgical history and problem list. Problem list updated.  Objective:   Vitals:   07/14/21 1024  BP: 125/81  Pulse: 94  Weight: 226 lb (102.5 kg)    Fetal Status: Fetal Heart Rate (bpm): 151         General:  Alert, oriented and cooperative. Patient is in no acute distress.  Skin: Skin is warm and dry. No rash noted.   Cardiovascular: Normal heart rate noted  Respiratory: Normal respiratory effort, no problems with respiration noted  Abdomen: Soft, gravid, appropriate for gestational age. Pain/Pressure: Absent     Pelvic:  Cervical exam deferred        Extremities: Normal range of motion.  Edema: None  Mental Status: Normal mood and affect. Normal behavior. Normal judgment and thought content.   Urinalysis:      Assessment and Plan:  Pregnancy: [id number] at [redacted]w[redacted]d  1. Supervision of high risk pregnancy, antepartum Stable Genetic testing reviewed - Genetic Screening - DICLEGIS 10-10 MG TBEC; Take 2 tablets by mouth at bedtime. If symptoms persist, add one tablet in the morning and  one in the afternoon  Dispense: 100 tablet; Refill: 5  2. Previous cesarean delivery affecting pregnancy Discuss mode of delivery at later [facility] visits  3. Seizure disorder in pregnancy, antepartum (HCC) Stable On Keppra  4. History of molar pregnancy Send placenta to pathology after delivery  5. 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 Obesity (BMI 35.0-39.9 without comorbidity) Start BASA qd. Indications reviewed  Preterm labor symptoms and general obstetric precautions including but not limited to vaginal bleeding, contractions, leaking of fluid and fetal movement were reviewed in detail with the patient. Please refer to After Visit Summary for other counseling recommendations.  Return in about 4 weeks (around 08/11/2021) for OB visit, face to face, MD only.   Hermina Staggers, MD

## 2021-07-14 NOTE — Patient Instructions (Signed)

## 2021-07-22 ENCOUNTER — Ambulatory Visit: Payer: Medicaid Other | Admitting: *Deleted

## 2021-07-27 ENCOUNTER — Encounter: Payer: Self-pay | Admitting: Obstetrics and Gynecology

## 2021-07-29 ENCOUNTER — Other Ambulatory Visit: Payer: Self-pay

## 2021-07-29 ENCOUNTER — Ambulatory Visit: Payer: Medicaid Other | Admitting: Neurology

## 2021-07-29 DIAGNOSIS — G40909 Epilepsy, unspecified, not intractable, without status epilepticus: Secondary | ICD-10-CM

## 2021-08-11 ENCOUNTER — Ambulatory Visit (INDEPENDENT_AMBULATORY_CARE_PROVIDER_SITE_OTHER): Payer: Medicaid Other | Admitting: Family Medicine

## 2021-08-11 ENCOUNTER — Encounter: Payer: Self-pay | Admitting: Family Medicine

## 2021-08-11 ENCOUNTER — Other Ambulatory Visit: Payer: Self-pay

## 2021-08-11 VITALS — BP 99/66 | HR 73 | Wt 226.0 lb

## 2021-08-11 DIAGNOSIS — O0990 Supervision of high risk pregnancy, unspecified, unspecified trimester: Secondary | ICD-10-CM

## 2021-08-11 DIAGNOSIS — Z8759 Personal history of other complications of pregnancy, childbirth and the puerperium: Secondary | ICD-10-CM

## 2021-08-11 DIAGNOSIS — G40909 Epilepsy, unspecified, not intractable, without status epilepticus: Secondary | ICD-10-CM

## 2021-08-11 DIAGNOSIS — E669 Obesity, unspecified: Secondary | ICD-10-CM

## 2021-08-11 DIAGNOSIS — Z3A17 17 weeks gestation of pregnancy: Secondary | ICD-10-CM

## 2021-08-11 DIAGNOSIS — O99350 Diseases of the nervous system complicating pregnancy, unspecified trimester: Secondary | ICD-10-CM

## 2021-08-11 DIAGNOSIS — O34219 Maternal care for unspecified type scar from previous cesarean delivery: Secondary | ICD-10-CM

## 2021-08-11 NOTE — Progress Notes (Signed)
° °  PRENATAL VISIT NOTE  Subjective:  Brittany Hendrix is a 31 y.o. [phone] at [redacted]w[redacted]d being seen today for ongoing prenatal care.  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  Patient reports no complaints.  Contractions: Not present. Vag. Bleeding: None.  Movement: Present. Denies leaking of fluid.   The following portions of the patient's history were reviewed and updated as appropriate: allergies, current medications, past family history, past medical history, past social history, past surgical history and problem list.   Objective:   Vitals:   08/11/21 1052  BP: 99/66  Pulse: 73  Weight: 226 lb (102.5 kg)    Fetal Status: Fetal Heart Rate (bpm): 143   Movement: Present  General:  Alert, oriented and cooperative. Patient is in no acute distress.  Skin: Skin is warm and dry. No rash noted.   Cardiovascular: Normal heart rate noted.  Respiratory: Normal respiratory effort, no problems with respiration noted.  Abdomen: Soft, gravid, appropriate for gestational age.  Pain/Pressure: Absent     Pelvic: Cervical exam deferred.  Extremities: Normal range of motion.  Edema: None.  Mental Status: Normal mood and affect. Normal behavior. Normal judgment and thought content.   Assessment and Plan:  Pregnancy: [id number] at [redacted]w[redacted]d  1. Supervision of high risk pregnancy, antepartum 2. [redacted] weeks gestation of pregnancy Progressing well. No concerns. FHT normal. AFP screening discussed, will complete today. Follow up for next OB visit in 4  weeks.  - AFP, Serum, Open Spina Bifida  3. Seizure disorder in pregnancy, antepartum [facility]) Reports last seizure in August of 2022. Stable now on Keppra. Will continue current regimen.  4. Obesity (BMI 35.0-39.9 without comorbidity) Taking ASA daily.   5. History of molar pregnancy Placenta to pathology at delivery.  6. Previous cesarean delivery affecting pregnancy Discussed RCS versus TOLAC with patient today. Questions and concerns addressed. She is still undecided. Will discuss further at future visits.  Preterm labor symptoms and general obstetric precautions including but not limited to vaginal bleeding, contractions, leaking of fluid and fetal movement were reviewed in detail with the patient.  Please refer to After Visit Summary for other counseling recommendations.   Return in about 4 weeks (around 09/08/2021) for follow up HR OB visit.  Future Appointments  Date Time Provider [place]  08/19/2021  9:30 AM [facility] NURSE [facility]  08/19/2021  9:45 AM [facility] US4 [facility]  09/08/2021 10:35 AM Chancy Milroy, MD [place] None  12/07/2021  3:15 PM Suzzanne Cloud, NP [facility] None    Genia Del, MD

## 2021-08-13 LAB — AFP, SERUM, OPEN SPINA BIFIDA
AFP MoM: 2.05
AFP Value: 75.9 ng/mL
Gest. Age on Collection Date: 17.6 weeks
Maternal Age At EDD: 30.5 yr
OSBR Risk 1 IN: 1443
Test Results:: NEGATIVE
Weight: 226 [lb_av]

## 2021-08-19 ENCOUNTER — Other Ambulatory Visit: Payer: Self-pay | Admitting: *Deleted

## 2021-08-19 ENCOUNTER — Ambulatory Visit: Payer: Medicaid Other | Admitting: *Deleted

## 2021-08-19 ENCOUNTER — Ambulatory Visit: Payer: Medicaid Other | Attending: Obstetrics and Gynecology

## 2021-08-19 ENCOUNTER — Encounter: Payer: Self-pay | Admitting: *Deleted

## 2021-08-19 ENCOUNTER — Other Ambulatory Visit: Payer: Self-pay

## 2021-08-19 VITALS — BP 104/54 | HR 71

## 2021-08-19 DIAGNOSIS — O0990 Supervision of high risk pregnancy, unspecified, unspecified trimester: Secondary | ICD-10-CM | POA: Diagnosis present

## 2021-08-19 DIAGNOSIS — O34219 Maternal care for unspecified type scar from previous cesarean delivery: Secondary | ICD-10-CM | POA: Insufficient documentation

## 2021-08-19 DIAGNOSIS — Z6837 Body mass index (BMI) 37.0-37.9, adult: Secondary | ICD-10-CM

## 2021-08-19 DIAGNOSIS — Z362 Encounter for other antenatal screening follow-up: Secondary | ICD-10-CM

## 2021-08-19 DIAGNOSIS — G40909 Epilepsy, unspecified, not intractable, without status epilepticus: Secondary | ICD-10-CM

## 2021-08-19 DIAGNOSIS — O4402 Placenta previa specified as without hemorrhage, second trimester: Secondary | ICD-10-CM

## 2021-08-23 NOTE — Procedures (Signed)
° °  HISTORY: 31 year old female history of recurrent seizure  TECHNIQUE:  This is a routine 16 channel EEG recording with one channel devoted to a limited EKG recording.  It was performed during wakefulness, drowsiness and asleep.  Hyperventilation and photic stimulation were performed as activating procedures.  There are minimum muscle and movement artifact noted.  Upon maximum arousal, posterior dominant waking rhythm consistent of rhythmic alpha range activity, with frequency of 11 no deeper hz. Activities are symmetric over the bilateral posterior derivations and attenuated with eye opening.  Hyperventilation produced mild/moderate buildup with higher amplitude and the slower activities noted.  Photic stimulation did not alter the tracing.  During EEG recording, patient developed drowsiness and no deeper  stage of sleep was achieved During EEG recording, there was no epileptiform discharge noted.  EKG demonstrate sinus rhythm, with heart rate of 60 bpm  CONCLUSION: This is a  normal awake EEG.  There is no electrodiagnostic evidence of epileptiform discharge.  Marcial Pacas, M.D. Ph.D.  [facility] [place], [place] [address] Phone: [phone] Fax:      [phone]

## 2021-09-08 ENCOUNTER — Encounter: Payer: Medicaid Other | Admitting: Obstetrics and Gynecology

## 2021-09-16 ENCOUNTER — Other Ambulatory Visit: Payer: Self-pay

## 2021-09-16 ENCOUNTER — Other Ambulatory Visit: Payer: Self-pay | Admitting: *Deleted

## 2021-09-16 ENCOUNTER — Encounter: Payer: Self-pay | Admitting: *Deleted

## 2021-09-16 ENCOUNTER — Ambulatory Visit: Payer: Medicaid Other | Attending: Obstetrics

## 2021-09-16 ENCOUNTER — Ambulatory Visit: Payer: Medicaid Other | Admitting: *Deleted

## 2021-09-16 VITALS — BP 104/61 | HR 87

## 2021-09-16 DIAGNOSIS — O99352 Diseases of the nervous system complicating pregnancy, second trimester: Secondary | ICD-10-CM

## 2021-09-16 DIAGNOSIS — O99212 Obesity complicating pregnancy, second trimester: Secondary | ICD-10-CM

## 2021-09-16 DIAGNOSIS — O4402 Placenta previa specified as without hemorrhage, second trimester: Secondary | ICD-10-CM

## 2021-09-16 DIAGNOSIS — Z3A23 23 weeks gestation of pregnancy: Secondary | ICD-10-CM

## 2021-09-16 DIAGNOSIS — Z6837 Body mass index (BMI) 37.0-37.9, adult: Secondary | ICD-10-CM | POA: Diagnosis not present

## 2021-09-16 DIAGNOSIS — O34219 Maternal care for unspecified type scar from previous cesarean delivery: Secondary | ICD-10-CM | POA: Insufficient documentation

## 2021-09-16 DIAGNOSIS — Z362 Encounter for other antenatal screening follow-up: Secondary | ICD-10-CM | POA: Diagnosis present

## 2021-09-16 DIAGNOSIS — G40909 Epilepsy, unspecified, not intractable, without status epilepticus: Secondary | ICD-10-CM

## 2021-09-16 DIAGNOSIS — O99350 Diseases of the nervous system complicating pregnancy, unspecified trimester: Secondary | ICD-10-CM

## 2021-09-17 ENCOUNTER — Telehealth (INDEPENDENT_AMBULATORY_CARE_PROVIDER_SITE_OTHER): Payer: Medicaid Other | Admitting: Obstetrics and Gynecology

## 2021-09-17 ENCOUNTER — Encounter: Payer: Self-pay | Admitting: Obstetrics and Gynecology

## 2021-09-17 DIAGNOSIS — Z3A23 23 weeks gestation of pregnancy: Secondary | ICD-10-CM

## 2021-09-17 DIAGNOSIS — O34219 Maternal care for unspecified type scar from previous cesarean delivery: Secondary | ICD-10-CM

## 2021-09-17 DIAGNOSIS — G40909 Epilepsy, unspecified, not intractable, without status epilepticus: Secondary | ICD-10-CM

## 2021-09-17 DIAGNOSIS — O0992 Supervision of high risk pregnancy, unspecified, second trimester: Secondary | ICD-10-CM

## 2021-09-17 DIAGNOSIS — O99352 Diseases of the nervous system complicating pregnancy, second trimester: Secondary | ICD-10-CM | POA: Diagnosis not present

## 2021-09-17 DIAGNOSIS — O0990 Supervision of high risk pregnancy, unspecified, unspecified trimester: Secondary | ICD-10-CM

## 2021-09-17 DIAGNOSIS — O99350 Diseases of the nervous system complicating pregnancy, unspecified trimester: Secondary | ICD-10-CM

## 2021-09-17 DIAGNOSIS — Z8759 Personal history of other complications of pregnancy, childbirth and the puerperium: Secondary | ICD-10-CM

## 2021-09-17 NOTE — Progress Notes (Signed)
° ° °  TELEHEALTH OBSTETRICS VISIT ENCOUNTER NOTE  Provider location: Center for [facility]   Patient location: Home  I connected with Brittany Hendrix on 09/17/21 at 11:15 AM EST by telephone at home and verified that I am speaking with the correct person using two identifiers. Of note, unable to do video encounter due to technical difficulties.    I discussed the limitations, risks, security and privacy concerns of performing an evaluation and management service by telephone and the availability of in person appointments. I also discussed with the patient that there may be a patient responsible charge related to this service. The patient expressed understanding and agreed to proceed.  Subjective:  Brittany Hendrix is a 31 y.o. [phone] at [redacted]w[redacted]d being followed for ongoing prenatal care.  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  Patient reports no complaints. Reports fetal movement. Denies any contractions, bleeding or leaking of fluid.   The following portions of the patient's history were reviewed and updated as appropriate: allergies, current medications, past family history, past medical history, past social history, past surgical history and problem list.   Objective:  Last menstrual period 04/08/2021, unknown if currently breastfeeding. General:  Alert, oriented and cooperative.   Mental Status: Normal mood and affect perceived. Normal judgment and thought content.  Rest of physical exam deferred due to type  of encounter  Assessment and Plan:  Pregnancy: [id number] at [redacted]w[redacted]d 1. Previous cesarean delivery affecting pregnancy Stable Undecided about repeat vs TOLAC Low lying placenta on last scan, follow up ordered by MFM  2. Supervision of high risk pregnancy, antepartum Stable Glucola next visit  3. Seizure disorder in pregnancy, antepartum (HCC) Stable on Keppra  4. History of molar pregnancy Placenta to pathology   Preterm labor symptoms and general obstetric precautions including but not limited to vaginal bleeding, contractions, leaking of fluid and fetal movement were reviewed in detail with the patient.  I discussed the assessment and treatment plan with the patient. The patient was provided an opportunity to ask questions and all were answered. The patient agreed with the plan and demonstrated an understanding of the instructions. The patient was advised to call back or seek an in-person office evaluation/go to [facility] at [facility] for any urgent or concerning symptoms. Please refer to After Visit Summary for other counseling recommendations.   I provided 8 minutes of non-face-to-face time during this encounter.  Return in about 4 weeks (around 10/15/2021) for OB visit, face to face, any provider, fasting for Glucola.  Future Appointments  Date Time Provider Department Center  10/28/2021 11:15 AM [facility] NURSE [facility]  10/28/2021 11:30 AM [facility] US[facility]  12/07/2021  3:15 PM Glean Salvo, NP [facility] None    Hermina Staggers, MD Center for [facility]

## 2021-09-17 NOTE — Patient Instructions (Signed)

## 2021-09-17 NOTE — Progress Notes (Signed)
Virtual Visit via Telephone Note  I connected with Brittany Hendrix on 09/17/21 at 11:15 AM EST by telephone and verified that I am speaking with the correct person using two identifiers.  Location: Patient: Home Provider: [facility]  Pt does not have her blood pressure cuff.

## 2021-10-15 ENCOUNTER — Ambulatory Visit (INDEPENDENT_AMBULATORY_CARE_PROVIDER_SITE_OTHER): Payer: Medicaid Other | Admitting: Obstetrics and Gynecology

## 2021-10-15 ENCOUNTER — Encounter: Payer: Self-pay | Admitting: Obstetrics and Gynecology

## 2021-10-15 ENCOUNTER — Other Ambulatory Visit: Payer: Self-pay

## 2021-10-15 ENCOUNTER — Other Ambulatory Visit: Payer: Medicaid Other

## 2021-10-15 VITALS — BP 98/64 | HR 80 | Wt 229.0 lb

## 2021-10-15 DIAGNOSIS — O4440 Low lying placenta NOS or without hemorrhage, unspecified trimester: Secondary | ICD-10-CM | POA: Insufficient documentation

## 2021-10-15 DIAGNOSIS — O99350 Diseases of the nervous system complicating pregnancy, unspecified trimester: Secondary | ICD-10-CM

## 2021-10-15 DIAGNOSIS — O0990 Supervision of high risk pregnancy, unspecified, unspecified trimester: Secondary | ICD-10-CM

## 2021-10-15 DIAGNOSIS — O34219 Maternal care for unspecified type scar from previous cesarean delivery: Secondary | ICD-10-CM

## 2021-10-15 DIAGNOSIS — G40909 Epilepsy, unspecified, not intractable, without status epilepticus: Secondary | ICD-10-CM

## 2021-10-15 DIAGNOSIS — Z8759 Personal history of other complications of pregnancy, childbirth and the puerperium: Secondary | ICD-10-CM

## 2021-10-15 NOTE — Progress Notes (Signed)
Subjective:  ?MARLYNN Hendrix is a 31 y.o. [phone] at [redacted]w[redacted]d being seen today for ongoing prenatal care.  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 ? ?Patient reports no complaints.  Contractions: Irritability. Vag. Bleeding: None.  Movement: Present. Denies leaking of fluid.  ? ?The following portions of the patient's history were reviewed and updated as appropriate: allergies, current medications, past family history, past medical history, past social history, past surgical history and problem list. Problem list updated. ? ?Objective:  ? ?Vitals:  ? 10/15/21 0924  ?BP: 98/64  ?Pulse: 80  ?Weight: 229 lb (103.9 kg)  ? ? ?Fetal Status: Fetal Heart Rate (bpm): 140   Movement: Present    ? ?General:  Alert, oriented and cooperative. Patient is in no acute distress.  ?Skin: Skin is warm and dry. No rash noted.   ?Cardiovascular: Normal heart rate noted  ?Respiratory: Normal respiratory effort, no problems with respiration noted  ?Abdomen: Soft, gravid, appropriate for gestational age. Pain/Pressure: Present     ?Pelvic:  Cervical exam deferred        ?Extremities: Normal range of motion.     ?Mental Status: Normal mood and affect. Normal behavior. Normal judgment and thought content.  ? ?Urinalysis:     ? ?Assessment and Plan:  ?Pregnancy: [id number] at [redacted]w[redacted]d ? ?1. Supervision of high risk pregnancy, antepartum ?Not NPO today. ?Will schedule Glucola and 28 week labs for next week ? ?2. Previous cesarean delivery affecting pregnancy ?Desires repeat. ?Papers  signed today ? ?3. History of molar pregnancy ?Placenta to pathology ? ?4. Seizure disorder in pregnancy, antepartum ([place]) ?Stable ?Continue with Keppra ? ?5. Low-lying placenta ?F/U U/S scheduled ? ?Preterm labor symptoms and general obstetric precautions including but not limited to vaginal bleeding, contractions, leaking of fluid and fetal movement were reviewed in detail with the patient. ?Please refer to After Visit Summary for other counseling recommendations.  ?Return in about 2 weeks (around 10/29/2021) for OB visit, face to face, MD only. ? ? ?Chancy Milroy, MD ?

## 2021-10-15 NOTE — Patient Instructions (Signed)

## 2021-10-19 ENCOUNTER — Telehealth: Payer: Self-pay | Admitting: *Deleted

## 2021-10-19 ENCOUNTER — Encounter (HOSPITAL_COMMUNITY): Payer: Self-pay | Admitting: Obstetrics & Gynecology

## 2021-10-19 ENCOUNTER — Inpatient Hospital Stay (HOSPITAL_COMMUNITY)
Admission: AD | Admit: 2021-10-19 | Discharge: 2021-10-19 | Disposition: A | Discharge status: Home / Self Care | Payer: Medicaid Other | Attending: Obstetrics & Gynecology | Admitting: Obstetrics & Gynecology

## 2021-10-19 DIAGNOSIS — Z3A27 27 weeks gestation of pregnancy: Secondary | ICD-10-CM | POA: Insufficient documentation

## 2021-10-19 DIAGNOSIS — O99612 Diseases of the digestive system complicating pregnancy, second trimester: Secondary | ICD-10-CM | POA: Diagnosis not present

## 2021-10-19 DIAGNOSIS — A084 Viral intestinal infection, unspecified: Secondary | ICD-10-CM | POA: Insufficient documentation

## 2021-10-19 DIAGNOSIS — O212 Late vomiting of pregnancy: Secondary | ICD-10-CM | POA: Insufficient documentation

## 2021-10-19 DIAGNOSIS — R112 Nausea with vomiting, unspecified: Secondary | ICD-10-CM | POA: Diagnosis not present

## 2021-10-19 DIAGNOSIS — O34219 Maternal care for unspecified type scar from previous cesarean delivery: Secondary | ICD-10-CM

## 2021-10-19 LAB — URINALYSIS, ROUTINE W REFLEX MICROSCOPIC
Bilirubin Urine: NEGATIVE
Glucose, UA: NEGATIVE mg/dL
Hgb urine dipstick: NEGATIVE
Ketones, ur: 80 mg/dL — AB
Nitrite: NEGATIVE
Protein, ur: 30 mg/dL — AB
Specific Gravity, Urine: 1.032 — ABNORMAL HIGH (ref 1.005–1.030)
pH: 5 (ref 5.0–8.0)

## 2021-10-19 MED ORDER — LOPERAMIDE HCL 2 MG PO CAPS
2.0000 mg | ORAL_CAPSULE | Freq: Once | ORAL | Status: AC
  Administered 2021-10-19: 2 mg via ORAL
  Filled 2021-10-19: qty 1

## 2021-10-19 MED ORDER — LACTATED RINGERS IV BOLUS
1000.0000 mL | Freq: Once | INTRAVENOUS | Status: AC
  Administered 2021-10-19: 1000 mL via INTRAVENOUS

## 2021-10-19 MED ORDER — LOPERAMIDE HCL 2 MG PO CAPS: 2.0000 mg | ORAL_CAPSULE | Freq: Four times a day (QID) | ORAL | 0 refills | Status: DC | PRN

## 2021-10-19 MED ORDER — ONDANSETRON HCL 4 MG/2ML IJ SOLN
4.0000 mg | Freq: Once | INTRAMUSCULAR | Status: AC
  Administered 2021-10-19: 4 mg via INTRAVENOUS
  Filled 2021-10-19: qty 2

## 2021-10-19 MED ORDER — ONDANSETRON 4 MG PO TBDP: 4.0000 mg | ORAL_TABLET | Freq: Three times a day (TID) | ORAL | 0 refills | Status: DC | PRN

## 2021-10-19 NOTE — MAU Provider Note (Addendum)
?History  ?  ? ?CSN: [id number] ? ?Arrival date and time: 10/19/21 1317 ? ? Event Date/Time  ? First Provider Initiated Contact with Patient 10/19/21 1343   ?  ? ?Chief Complaint  ?Patient presents with  ? Nausea  ? Emesis  ? Diarrhea  ? ?Brittany Hendrix  is a 31 y/o age . [id number], at [facility]  [redacted]w[redacted]d  presents today for 3 days of nauseas, emesis and diarrhea after being exposed to a child with vomit.States everyone else in her family is getting better but she is still sick. Has thrown up x3 times in the last 24h, the emesis has an appearance of saliva and is having diarrhea x4 times in the last 24 hours, w/o blood. Reports rib pain from throwing up. Has not taken anything for the nausea. Reports a headache, did take tylenol at 0630 without relief. She has tried increased fluids for the symptoms without relief. Denies VB or LOF. +FM ?  ? ? ?OB History   ? ? Gravida  ?9  ? Para  ?2  ? Term  ?2  ? Preterm  ?0  ? AB  ?6  ? Living  ?2  ?  ? ? SAB  ?5  ? IAB  ?1  ? Ectopic  ?0  ? Multiple  ?0  ? Live Births  ?2  ?   ?  ?  ? ? ?Past Medical History:  ?Diagnosis Date  ? Abortion history   ? 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 Per ultrasound March 2021. [ ]  Inform patient Per up-to-date "considered to be a normal variant. Patients are asymptomatic, have no compromise of fertility, and similar pregnancy outcomes as those in the general obstetric population. "   ? Headache(784.0)   ? Seizures ([place])   ? Last seizure March 2020  ? ? ?Past Surgical History:  ?Procedure Laterality Date  ? CESAREAN SECTION N/A 05/20/2020  ? Procedure: CESAREAN SECTION;  Surgeon: Osborne Oman, MD;  [facility]: [facility];  Service: Obstetrics;  Laterality: N/A;  ? DILATION AND CURETTAGE OF UTERUS    ? abortion  ? DILATION AND EVACUATION N/A 10/04/2017  ? Procedure: DILATATION AND EVACUATION;  Surgeon: Delsa Bern, MD;  Location: [facility];  Service: Gynecology;  Laterality: N/A;  ? DILATION AND EVACUATION N/A 12/23/2020  ? Procedure: DILATATION AND EVACUATION;   Surgeon: Woodroe Mode, MD;  Location: [place];  Service: Gynecology;  Laterality: N/A;  ? OPERATIVE ULTRASOUND N/A 12/23/2020  ? Procedure: OPERATIVE ULTRASOUND;  Surgeon: Woodroe Mode, MD;  Location: [place];  Service: Gynecology;  Laterality: N/A;  ? ? ?Family History  ?Problem Relation Age of Onset  ? Diabetes Mother   ? Asthma Brother   ? ? ?Social History  ? ?Tobacco Use  ? Smoking status: Every Day  ?  Packs/day: 0.50  ?  Years: 5.00  ?  Pack years: 2.50  ?  Types: Cigarettes  ? Smokeless tobacco: Never  ?Vaping Use  ? Vaping Use: Never used  ?Substance Use Topics  ? Alcohol use: Not Currently  ?  Comment: occasional, not while preg  ? Drug use: Yes  ?  Types: Marijuana  ?  Comment: last used 05/15/2021 as of 05/22/2021  ? ? ?Allergies: No Known Allergies ? ?Medications Prior to Admission  ?Medication Sig Dispense Refill Last Dose  ? levETIRAcetam (KEPPRA) 1000 MG tablet Take 1 tablet (1,000 mg total) by mouth 2 (two) times daily. 180 tablet 4 10/18/2021  ? Prenatal Vit-Fe  Fumarate-FA (MULTIVITAMIN-PRENATAL) 27-0.8 MG TABS tablet Take 1 tablet by mouth daily at 12 noon.   10/18/2021  ? ? ?Review of Systems  ?Constitutional:  Positive for appetite change.  ?HENT: Negative.    ?Respiratory: Negative.    ?Cardiovascular: Negative.   ?Gastrointestinal:  Positive for diarrhea and vomiting.  ?Genitourinary: Negative.   ?Psychiatric/Behavioral: Negative.    ? ?Physical Exam  ? ?Blood pressure (!) 105/59, pulse 74, temperature 97.7 ?F (36.5 ?C), temperature source Oral, resp. rate 20, last menstrual period 04/08/2021, SpO2 99 %, unknown if currently breastfeeding. ? ?Physical Exam ? ?General: alert, cooperative and no distress ?Skin: Skin is warm and dry. No rash noted. ?Cardiovascular: Normal heart sounds ?Respiratory:  no respiratory distress, lungs are clear, no wheeze, rales or crackles.  ?Abdomen: soft, non-tender, gravid appropriated for gestational age.   ?Pelvic: Cervical exam deferred ?Extremities: No edema, no  tender. ?Mental Status: Normal mood and affect. Normal behavior. Normal judgment and thought content. ? ? ?MAU Course  ?Procedures ?Patient presents nauseas, vomit and diarrhea. ?PE WNL. AU and culture was ordered to rule out UTI. AU reported nitrite negative,some leukocytes, many bacteria. Patient is asymptomatic. Urine culture pending. ?It was ordered loperamide and Zofran for nauseas and diarrhea.  ?Patient was D/C home with recommendations.  ? ?Assessment and Plan  ?Brittany Hendrix  is a 31 y/o age . [id number], at [place]  [redacted]w[redacted]d  ? ?1.Viral gastroenteritis ?2. 27 week pregnancy ? ?Welford Roche ?[employer] PA student  ?10/19/2021, 2:14 PM  ? ? ?CNM attestation: ? ?I have seen and examined this patient and agree with above documentation in the PA student's note.  ? ?Brittany Hendrix is a 31 y.o. [phone] at [redacted]w[redacted]d reporting nausea, vomiting, and diarrhea. Patient reports symptoms started on Sunday. Reports approximately 3 episodes of vomiting of the past 24 hours as well as numerous episodes of watery diarrhea. She denies fever or chills. Has been able to tolerate small sips of water, but has not tried to eat anything as she is "afraid". She reports she was at a family event where multiple people started having the same symptoms. Everyone else is starting to feel better, however she is concerned as she still feels bad. She denies pain, bleeding, leaking fluid. Endorses active fetal movement. ? ?Patient receives prenatal care at [facility]. Pregnancy has been complicated by placenta previa.  ? ?PE: ?Patient Vitals for the past 24 hrs: ? BP Temp Temp src Pulse Resp SpO2  ?10/19/21 1551 (!) 111/55 -- -- 60 -- 99 %  ?10/19/21 1402 -- -- -- -- -- 97 %  ?10/19/21 1339 (!) 105/59 -- -- 74 -- --  ?10/19/21 1329 (!) 109/56 97.7 ?F (36.5 ?C) Oral 79 20 99 %  ? ?Gen: alert, no acute distress ?Resp: normal effort ?Heart: regular rate ?Abdomen: soft, non-tender, gravid ? ?FHR: Baseline 130 bpm, moderate variability, +10x10 accels, no decels ?Toco:  quiet ? ?ROS, labs, PMH reviewed ? ?Orders Placed This Encounter  ?Procedures  ? Culture, OB Urine  ? Urinalysis, Routine w reflex microscopic Urine, Clean Catch  ? Discharge patient  ? ?Meds ordered this encounter  ?Medications  ? lactated ringers bolus 1,000 mL  ? ondansetron (ZOFRAN) injection 4 mg  ? loperamide (IMODIUM) capsule 2 mg  ? ondansetron (ZOFRAN-ODT) 4 MG disintegrating tablet  ?  Sig: Take 1 tablet (4 mg total) by mouth every 8 (eight) hours as needed for nausea or vomiting.  ?  Dispense:  15 tablet  ?  Refill:  0  ?  Order Specific Question:   Supervising Provider  ?  Answer:   Donnamae Jude [id number]  ? loperamide (IMODIUM) 2 MG capsule  ?  Sig: Take 1 capsule (2 mg total) by mouth 4 (four) times daily as needed for diarrhea or loose stools.  ?  Dispense:  12 capsule  ?  Refill:  0  ?  Order Specific Question:   Supervising Provider  ?  Answer:   Donnamae Jude [id number]  ? ? ?MDM ?UA, culture pending. Urine >80 ketones. LR bolus with IV zofran given. Loperamide was ordered. Patient with no episodes of vomiting while in MAU. Able to tolerate PO fluids. NST reassuring for gestational age. Toco quiet. Symptoms likely viral gastroenteritis given history and symptoms. ? ?Assessment: ?1. [redacted] weeks gestation of pregnancy   ?2. Previous cesarean delivery affecting pregnancy   ?3. Viral gastroenteritis   ?4. Nausea vomiting and diarrhea   ? ? ?Plan: ?- Discharge home in stable condition ?- Rx for Zofran and Loperamide sent ?- BRAT diet. Advance as tolerated ?- Strict return precautions ?- Return to MAU as needed ?- Follow-up as scheduled at [facility] for next prenatal visit  ? ? ? ?Renee Harder, CNM ?10/19/2021 ?6:02 PM ? ?

## 2021-10-19 NOTE — Telephone Encounter (Signed)
Message from call center, pt called over lunch reporting vomiting and diarrhea for the past few days, cold and hot flashes and being "too weak to stand." Pt was advised by call center nurse to call 911. Pt declined and stated she would have someone drive her to the hospital. No indication pt has checked in at this time.TC to follow up with patient. No answer. Left message indicating concern and asking for a call back if she is not currently on the way to MAU.  ?

## 2021-10-19 NOTE — MAU Note (Signed)
.  Brittany Hendrix is a 31 y.o. at [redacted]w[redacted]d here in MAU reporting: was exposed to a child with the stomach bug on Saturday. Started having n/v/d on Sunday. States everyone else in her family is getting better but she is still sick. Has thrown up x3 times in the last 24h and still having diarrhea (unsure of how many time). Reports rib pain from throwing up. Has not taken anything for the nausea. Reports a headache, did take tylenol at 0630 without relief. Denies VB or LOF. +FM ? ?Pain score:  ?Rib pain- 7 ?Headache- 6 ?Vitals:  ? 10/19/21 1329  ?BP: (!) 109/56  ?Pulse: 79  ?Resp: 20  ?Temp: 97.7 ?F (36.5 ?C)  ?SpO2: 99%  ?   ?FHT:130 ?Lab orders placed from triage:  UA ?

## 2021-10-20 LAB — CULTURE, OB URINE

## 2021-10-22 ENCOUNTER — Other Ambulatory Visit: Payer: Medicaid Other

## 2021-10-22 DIAGNOSIS — O0990 Supervision of high risk pregnancy, unspecified, unspecified trimester: Secondary | ICD-10-CM

## 2021-10-23 LAB — CBC
Hematocrit: 30.7 % — ABNORMAL LOW (ref 34.0–46.6)
Hemoglobin: 10.5 g/dL — ABNORMAL LOW (ref 11.1–15.9)
MCH: 31.8 pg (ref 26.6–33.0)
MCHC: 34.2 g/dL (ref 31.5–35.7)
MCV: 93 fL (ref 79–97)
Platelets: 201 10*3/uL (ref 150–450)
RBC: 3.3 x10E6/uL — ABNORMAL LOW (ref 3.77–5.28)
RDW: 12.1 % (ref 11.7–15.4)
WBC: 6.7 10*3/uL (ref 3.4–10.8)

## 2021-10-23 LAB — GLUCOSE TOLERANCE, 2 HOURS W/ 1HR
Glucose, 1 hour: 127 mg/dL (ref 70–179)
Glucose, 2 hour: 126 mg/dL (ref 70–152)
Glucose, Fasting: 82 mg/dL (ref 70–91)

## 2021-10-23 LAB — HIV ANTIBODY (ROUTINE TESTING W REFLEX): HIV Screen 4th Generation wRfx: NONREACTIVE

## 2021-10-23 LAB — RPR: RPR Ser Ql: NONREACTIVE

## 2021-10-28 ENCOUNTER — Other Ambulatory Visit: Payer: Self-pay | Admitting: Maternal & Fetal Medicine

## 2021-10-28 ENCOUNTER — Ambulatory Visit: Payer: Medicaid Other | Attending: Obstetrics and Gynecology

## 2021-10-28 ENCOUNTER — Ambulatory Visit: Payer: Medicaid Other | Admitting: *Deleted

## 2021-10-28 ENCOUNTER — Encounter: Payer: Self-pay | Admitting: *Deleted

## 2021-10-28 VITALS — BP 112/56 | HR 80

## 2021-10-28 DIAGNOSIS — O99212 Obesity complicating pregnancy, second trimester: Secondary | ICD-10-CM | POA: Insufficient documentation

## 2021-10-28 DIAGNOSIS — O4402 Placenta previa specified as without hemorrhage, second trimester: Secondary | ICD-10-CM | POA: Diagnosis present

## 2021-10-28 DIAGNOSIS — G40909 Epilepsy, unspecified, not intractable, without status epilepticus: Secondary | ICD-10-CM

## 2021-10-28 DIAGNOSIS — O99213 Obesity complicating pregnancy, third trimester: Secondary | ICD-10-CM

## 2021-10-28 DIAGNOSIS — O99350 Diseases of the nervous system complicating pregnancy, unspecified trimester: Secondary | ICD-10-CM | POA: Insufficient documentation

## 2021-10-28 DIAGNOSIS — O34219 Maternal care for unspecified type scar from previous cesarean delivery: Secondary | ICD-10-CM | POA: Insufficient documentation

## 2021-10-28 DIAGNOSIS — O4403 Placenta previa specified as without hemorrhage, third trimester: Secondary | ICD-10-CM | POA: Diagnosis not present

## 2021-10-28 DIAGNOSIS — O4440 Low lying placenta NOS or without hemorrhage, unspecified trimester: Secondary | ICD-10-CM

## 2021-10-28 DIAGNOSIS — E669 Obesity, unspecified: Secondary | ICD-10-CM

## 2021-10-28 DIAGNOSIS — O99210 Obesity complicating pregnancy, unspecified trimester: Secondary | ICD-10-CM

## 2021-10-28 DIAGNOSIS — O99353 Diseases of the nervous system complicating pregnancy, third trimester: Secondary | ICD-10-CM

## 2021-10-28 DIAGNOSIS — Z3A29 29 weeks gestation of pregnancy: Secondary | ICD-10-CM

## 2021-11-01 ENCOUNTER — Encounter: Payer: Medicaid Other | Admitting: Obstetrics and Gynecology

## 2021-11-10 ENCOUNTER — Telehealth: Payer: Self-pay | Admitting: Emergency Medicine

## 2021-11-10 NOTE — Telephone Encounter (Signed)
Pt called into office with complaints of abdominal cramping. Denies n/v but endorses diarrhea, 1 large episode a day. +fetal movement, denies vag bleeding, LOF. ?Advised to rest, hydrate.  ?Given PTL precautions.  ?

## 2021-11-20 ENCOUNTER — Inpatient Hospital Stay (HOSPITAL_BASED_OUTPATIENT_CLINIC_OR_DEPARTMENT_OTHER): Payer: Medicaid Other

## 2021-11-20 ENCOUNTER — Inpatient Hospital Stay (HOSPITAL_COMMUNITY)
Admission: AD | Admit: 2021-11-20 | Discharge: 2021-11-20 | Disposition: A | Discharge status: Home / Self Care | Payer: Medicaid Other | Attending: Obstetrics and Gynecology | Admitting: Obstetrics and Gynecology

## 2021-11-20 DIAGNOSIS — O26893 Other specified pregnancy related conditions, third trimester: Secondary | ICD-10-CM | POA: Insufficient documentation

## 2021-11-20 DIAGNOSIS — R109 Unspecified abdominal pain: Secondary | ICD-10-CM | POA: Insufficient documentation

## 2021-11-20 DIAGNOSIS — O4413 Placenta previa with hemorrhage, third trimester: Secondary | ICD-10-CM | POA: Insufficient documentation

## 2021-11-20 DIAGNOSIS — Z3A32 32 weeks gestation of pregnancy: Secondary | ICD-10-CM

## 2021-11-20 DIAGNOSIS — F17210 Nicotine dependence, cigarettes, uncomplicated: Secondary | ICD-10-CM | POA: Diagnosis not present

## 2021-11-20 DIAGNOSIS — O4693 Antepartum hemorrhage, unspecified, third trimester: Secondary | ICD-10-CM

## 2021-11-20 DIAGNOSIS — Z3689 Encounter for other specified antenatal screening: Secondary | ICD-10-CM

## 2021-11-20 LAB — WET PREP, GENITAL
Clue Cells Wet Prep HPF POC: NONE SEEN
Sperm: NONE SEEN
Trich, Wet Prep: NONE SEEN
WBC, Wet Prep HPF POC: >=10 — AB (ref <10)
Yeast Wet Prep HPF POC: NONE SEEN

## 2021-11-20 NOTE — MAU Note (Signed)
Patient arrived from home via EMS complaining of vaginal bleeding and cramping, that started at 1am. Patient also reports that she she was spitting up blood that was bright red yesterday around 1800.  ?Patient stated that she diagnosed with a placenta previa  and has not resolved. ?+ FM reported. Denies Leakage of fluid ?Reports feeling braxton hicks ctx that are irregular.  ? ?

## 2021-11-20 NOTE — MAU Provider Note (Addendum)
Patient Brittany Hendrix is a 31 y.o. [id number] ? At [redacted]w[redacted]d here with complaints of vaginal bleeding that occurred one time this morning at 1 am (9 hours ago). It occurred after having a BM.  She denies LOF, decreased fetal movements. She reports some contractions. She has a known previa; last [place] placenta measured 1.7 cm from the os. She denies recent IC. She reports that she "waited to get checked" because she "wanted to see if it got worse". When it did not get worse, she decided to come in because "Google scared me".  ? ?She reports that she was coughing up blood because she was smoking cigarettes last night; it was mostly mucous with some flecks of blood.  ?History  ?  ? ?CSN: [id number] ? ?Arrival date and time: 11/20/21 9163 ? ? Event Date/Time  ? First Provider Initiated Contact with Patient 11/20/21 [phone]   ?  ? ?Chief Complaint  ?Patient presents with  ? Vaginal Bleeding  ? Abdominal Pain  ? ?Vaginal Bleeding ?The patient's primary symptoms include vaginal bleeding. This is a new problem. The current episode started today. The problem has been resolved. She is pregnant. Associated symptoms include abdominal pain. Pertinent negatives include no constipation, diarrhea, dysuria, fever, nausea, urgency or vomiting. The vaginal discharge was bloody. The vaginal bleeding is spotting. She has not been passing clots. She has not been passing tissue. Nothing aggravates the symptoms.  ?Abdominal Pain ?Pertinent negatives include no constipation, diarrhea, dysuria, fever, nausea or vomiting.  ?Patient reports "little cramps" but the pain is a 0/10.  ?OB History   ? ? Gravida  ?9  ? Para  ?2  ? Term  ?2  ? Preterm  ?0  ? AB  ?6  ? Living  ?2  ?  ? ? SAB  ?5  ? IAB  ?1  ? Ectopic  ?0  ? Multiple  ?0  ? Live Births  ?2  ?   ?  ?  ? ? ?Past Medical History:  ?Diagnosis Date  ? Abortion history   ? 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 Per ultrasound March 2021. [ ]  Inform patient Per up-to-date "considered to be a normal variant.  Patients are asymptomatic, have no compromise of fertility, and similar pregnancy outcomes as those in the general obstetric population. "   ? Headache(784.0)   ? Seizures ([facility])   ? Last seizure March 2020  ? ? ?Past Surgical History:  ?Procedure Laterality Date  ? CESAREAN SECTION N/A 05/20/2020  ? Procedure: CESAREAN SECTION;  Surgeon: 05/22/2020, MD;  Location: [facility];  Service: Obstetrics;  Laterality: N/A;  ? DILATION AND CURETTAGE OF UTERUS    ? abortion  ? DILATION AND EVACUATION N/A 10/04/2017  ? Procedure: DILATATION AND EVACUATION;  Surgeon: 10/06/2017, MD;  Location: [facility];  Service: Gynecology;  Laterality: N/A;  ? DILATION AND EVACUATION N/A 12/23/2020  ? Procedure: DILATATION AND EVACUATION;  Surgeon: 02/22/2021, MD;  Location: [facility] OR;  Service: Gynecology;  Laterality: N/A;  ? OPERATIVE ULTRASOUND N/A 12/23/2020  ? Procedure: OPERATIVE ULTRASOUND;  Surgeon: 02/22/2021, MD;  Location: [facility] OR;  Service: Gynecology;  Laterality: N/A;  ? ? ?Family History  ?Problem Relation Age of Onset  ? Diabetes Mother   ? Asthma Brother   ? ? ?Social History  ? ?Tobacco Use  ? Smoking status: Every Day  ?  Packs/day: 0.50  ?  Years: 5.00  ?  Pack years: 2.50  ?  Types: Cigarettes  ? Smokeless tobacco: Never  ?Vaping Use  ? Vaping Use: Never used  ?Substance Use Topics  ? Alcohol use: Not Currently  ?  Comment: occasional, not while preg  ? Drug use: Yes  ?  Types: Marijuana  ?  Comment: last used 05/15/2021 as of 05/22/2021  ? ? ?Allergies: No Known Allergies ? ?Medications Prior to Admission  ?Medication Sig Dispense Refill Last Dose  ? levETIRAcetam (KEPPRA) 1000 MG tablet Take 1 tablet (1,000 mg total) by mouth 2 (two) times daily. 180 tablet 4 11/19/2021  ? loperamide (IMODIUM) 2 MG capsule Take 1 capsule (2 mg total) by mouth 4 (four) times daily as needed for diarrhea or loose stools. (Patient not taking: Reported on 10/28/2021) 12 capsule 0   ? ondansetron (ZOFRAN-ODT) 4 MG disintegrating tablet  Take 1 tablet (4 mg total) by mouth every 8 (eight) hours as needed for nausea or vomiting. (Patient not taking: Reported on 10/28/2021) 15 tablet 0   ? Prenatal Vit-Fe Fumarate-FA (MULTIVITAMIN-PRENATAL) 27-0.8 MG TABS tablet Take 1 tablet by mouth daily at 12 noon.     ? ? ?Review of Systems  ?Constitutional: Negative.  Negative for fever.  ?HENT: Negative.    ?Respiratory: Negative.    ?Cardiovascular: Negative.   ?Gastrointestinal:  Positive for abdominal pain. Negative for constipation, diarrhea, nausea and vomiting.  ?Genitourinary:  Positive for vaginal bleeding. Negative for dysuria and urgency.  ?Neurological: Negative.   ?Psychiatric/Behavioral: Negative.    ?Physical Exam  ? ?Blood pressure 114/70, pulse 76, resp. rate 16, height 5\' 4"  (1.626 m), weight 102.5 kg, last menstrual period 04/08/2021, SpO2 98 %, unknown if currently breastfeeding. ? ?Physical Exam ?Constitutional:   ?   Appearance: She is well-developed.  ?Abdominal:  ?   General: Abdomen is flat.  ?   Tenderness: There is no abdominal tenderness.  ?Genitourinary: ?   Vagina: Normal.  ?   Cervix: No discharge.  ?   Uterus: Normal.   ?   Adnexa: Right adnexa normal.  ?   Rectum: Normal.  ?   Comments: NEFG; no blood on labia. White leukkorhea in vagina, no blood on speculum exam. Cervix is external os at 1 cm but internal os was closed on gentle digital exam.  ?Neurological:  ?   Mental Status: She is alert.  ? ? ?MAU Course  ?Procedures ? ?MDM ?-NST; 120 bpm, mod var, present acel no decels, patient feels strong fetal movements ?-04/10/2021 shows no previa identified ?-patient has had no active bleeding while in MAU  ?-she feels well, and has no other complaints.  ?-wet prep negative ? ?Assessment and Plan  ? ?1. NST (non-stress test) reactive   ?2. [redacted] weeks gestation of pregnancy   ?-keep appt this week with MFM ?-strict pelvic rest advised, although I reassured her that placenta previa seems to be resolving ?-Blood type is A pos ?-return precautions  reviewed; patient verbalized understanding ?-GC CT pending ? ?[place] Kataleena Holsapple ?11/20/2021, 10:00 AM  ?

## 2021-11-20 NOTE — MAU Note (Signed)
Pt brought up from ambulance in [place].  Discussed with EMS team that a bleeding previa can be a medical emergency and she needed to be brought up in a stretcher.  "She refused, did not want to sit down". ?

## 2021-11-23 LAB — GC/CHLAMYDIA PROBE AMP (~~LOC~~) NOT AT ARMC
Chlamydia: NEGATIVE
Comment: NEGATIVE
Comment: NORMAL
Neisseria Gonorrhea: NEGATIVE

## 2021-11-24 ENCOUNTER — Encounter: Payer: Self-pay | Admitting: Medical

## 2021-11-24 ENCOUNTER — Ambulatory Visit (INDEPENDENT_AMBULATORY_CARE_PROVIDER_SITE_OTHER): Payer: Medicaid Other | Admitting: Medical

## 2021-11-24 VITALS — BP 91/57 | HR 70 | Wt 221.0 lb

## 2021-11-24 DIAGNOSIS — O34219 Maternal care for unspecified type scar from previous cesarean delivery: Secondary | ICD-10-CM

## 2021-11-24 DIAGNOSIS — O99213 Obesity complicating pregnancy, third trimester: Secondary | ICD-10-CM

## 2021-11-24 DIAGNOSIS — O0993 Supervision of high risk pregnancy, unspecified, third trimester: Secondary | ICD-10-CM

## 2021-11-24 DIAGNOSIS — O4443 Low lying placenta NOS or without hemorrhage, third trimester: Secondary | ICD-10-CM

## 2021-11-24 DIAGNOSIS — O4440 Low lying placenta NOS or without hemorrhage, unspecified trimester: Secondary | ICD-10-CM

## 2021-11-24 DIAGNOSIS — Z3A32 32 weeks gestation of pregnancy: Secondary | ICD-10-CM

## 2021-11-24 DIAGNOSIS — O99210 Obesity complicating pregnancy, unspecified trimester: Secondary | ICD-10-CM

## 2021-11-24 DIAGNOSIS — G40909 Epilepsy, unspecified, not intractable, without status epilepticus: Secondary | ICD-10-CM

## 2021-11-24 DIAGNOSIS — O0990 Supervision of high risk pregnancy, unspecified, unspecified trimester: Secondary | ICD-10-CM

## 2021-11-24 DIAGNOSIS — O99353 Diseases of the nervous system complicating pregnancy, third trimester: Secondary | ICD-10-CM

## 2021-11-24 NOTE — Progress Notes (Signed)
Pt presents for ROB requests an exact c/s date.  ?

## 2021-11-24 NOTE — Progress Notes (Signed)
? ?  PRENATAL VISIT NOTE ? ?Subjective:  ?Brittany Hendrix is a 31 y.o. [phone] at [redacted]w[redacted]d being seen today for ongoing prenatal care.  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 ? ?Patient reports occasional contractions.  Contractions: Irritability. Vag. Bleeding: None.  Movement: Present. Denies leaking of fluid.  ? ?The following portions of the patient's history were reviewed and updated as appropriate: allergies, current medications, past family history, past medical history, past social history, past surgical history and problem list.  ? ?Objective:  ? ?Vitals:  ? 11/24/21 1441  ?BP: (!) 91/57  ?Pulse: 70  ?Weight: 221 lb (100.2 kg)  ? ? ?Fetal Status: Fetal Heart Rate (bpm): 145   Movement: Present    ? ?General:  Alert, oriented and cooperative. Patient is in no acute distress.  ?Skin: Skin is warm and dry. No rash noted.   ?Cardiovascular: Normal heart rate noted  ?Respiratory: Normal respiratory effort, no problems with respiration noted  ?Abdomen: Soft, gravid, appropriate for gestational age.  Pain/Pressure: Present     ?Pelvic: Cervical exam deferred        ?Extremities: Normal range of motion.  Edema: None  ?Mental Status: Normal mood and affect. Normal behavior. Normal judgment and thought content.  ? ?Assessment and Plan:  ?Pregnancy: [id number] at [redacted]w[redacted]d ?1. Previous cesarean delivery affecting pregnancy ?- Planning repeat, message sent to scheduler today  ? ?2. Supervision of high risk pregnancy, antepartum ?- Planning POPs for MOC ? ?3.  Low-lying placenta ? ?4. Obesity during pregnancy, antepartum ?- Growth [place] tomorrow, likely again at 36 weeks  ? ?5. Seizure disorder in pregnancy, antepartum (HCC) ?- On Keppra, no recent seizure activity  ? ?6. [redacted] weeks gestation of pregnancy ? ?Preterm labor symptoms and general obstetric precautions including but not limited to vaginal bleeding, contractions, leaking of fluid and fetal movement were reviewed in detail with the patient. ?Please refer to After Visit Summary for other counseling recommendations.  ? ?Return in about 2 weeks (around 12/08/2021) for [facility] APP, any provider, In-Person. ? ?Future Appointments  ?Date Time Provider Department Center  ?11/25/2021 12:30 PM [facility] NURSE [facility]  ?11/25/2021 12:45 PM [facility] US4 [facility]  ?12/07/2021  3:15 PM Glean Salvo, NP [facility] None  ?12/09/2021  2:30 PM Hermina Staggers, MD [facility] None  ? ? ?Vonzella Nipple, PA-C ? ?

## 2021-11-25 ENCOUNTER — Ambulatory Visit: Payer: Medicaid Other | Attending: Maternal & Fetal Medicine

## 2021-11-25 ENCOUNTER — Other Ambulatory Visit: Payer: Self-pay | Admitting: Maternal & Fetal Medicine

## 2021-11-25 ENCOUNTER — Ambulatory Visit: Payer: Medicaid Other | Admitting: *Deleted

## 2021-11-25 VITALS — BP 106/60 | HR 93

## 2021-11-25 DIAGNOSIS — G40909 Epilepsy, unspecified, not intractable, without status epilepticus: Secondary | ICD-10-CM

## 2021-11-25 DIAGNOSIS — E669 Obesity, unspecified: Secondary | ICD-10-CM

## 2021-11-25 DIAGNOSIS — O4440 Low lying placenta NOS or without hemorrhage, unspecified trimester: Secondary | ICD-10-CM

## 2021-11-25 DIAGNOSIS — Z3A33 33 weeks gestation of pregnancy: Secondary | ICD-10-CM

## 2021-11-25 DIAGNOSIS — O34219 Maternal care for unspecified type scar from previous cesarean delivery: Secondary | ICD-10-CM

## 2021-11-25 DIAGNOSIS — O99213 Obesity complicating pregnancy, third trimester: Secondary | ICD-10-CM

## 2021-11-25 DIAGNOSIS — O99353 Diseases of the nervous system complicating pregnancy, third trimester: Secondary | ICD-10-CM | POA: Diagnosis not present

## 2021-11-25 DIAGNOSIS — O4443 Low lying placenta NOS or without hemorrhage, third trimester: Secondary | ICD-10-CM

## 2021-11-25 DIAGNOSIS — O99210 Obesity complicating pregnancy, unspecified trimester: Secondary | ICD-10-CM

## 2021-11-25 DIAGNOSIS — O99350 Diseases of the nervous system complicating pregnancy, unspecified trimester: Secondary | ICD-10-CM | POA: Diagnosis present
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
## 2021-12-07 ENCOUNTER — Telehealth: Payer: Self-pay | Admitting: Neurology

## 2021-12-07 ENCOUNTER — Encounter: Payer: Self-pay | Admitting: Neurology

## 2021-12-07 ENCOUNTER — Telehealth (INDEPENDENT_AMBULATORY_CARE_PROVIDER_SITE_OTHER): Payer: Medicaid Other | Admitting: Neurology

## 2021-12-07 DIAGNOSIS — G40909 Epilepsy, unspecified, not intractable, without status epilepticus: Secondary | ICD-10-CM | POA: Diagnosis not present

## 2021-12-07 NOTE — Patient Instructions (Signed)
Continue to take Keppra as prescribed, not to miss any doses ?Continue your prenatal, folic acid supplement ?Let us know immediately if any seizure-like activity ?Please update our office once the baby is born we will plan for 40-month follow-up, sooner if needed ?

## 2021-12-07 NOTE — Telephone Encounter (Signed)
..   Pt understands that although there may be some limitations with this type of visit, we will take all precautions to reduce any security or privacy concerns.  Pt understands that this will be treated like an in office visit and we will file with pt's insurance, and there may be a patient responsible charge related to this service. ? ?

## 2021-12-08 NOTE — Progress Notes (Signed)
Chart reviewed, agree above plan ?

## 2021-12-09 ENCOUNTER — Encounter: Payer: Self-pay | Admitting: Obstetrics and Gynecology

## 2021-12-09 ENCOUNTER — Other Ambulatory Visit (HOSPITAL_COMMUNITY)
Admission: RE | Admit: 2021-12-09 | Discharge: 2021-12-09 | Disposition: A | Discharge status: Home / Self Care | Payer: Medicaid Other | Source: Ambulatory Visit | Attending: Obstetrics and Gynecology | Admitting: Obstetrics and Gynecology

## 2021-12-09 ENCOUNTER — Ambulatory Visit (INDEPENDENT_AMBULATORY_CARE_PROVIDER_SITE_OTHER): Payer: Medicaid Other | Admitting: Obstetrics and Gynecology

## 2021-12-09 VITALS — BP 103/65 | HR 66 | Wt 223.0 lb

## 2021-12-09 DIAGNOSIS — G40909 Epilepsy, unspecified, not intractable, without status epilepticus: Secondary | ICD-10-CM

## 2021-12-09 DIAGNOSIS — O0990 Supervision of high risk pregnancy, unspecified, unspecified trimester: Secondary | ICD-10-CM | POA: Insufficient documentation

## 2021-12-09 DIAGNOSIS — O34219 Maternal care for unspecified type scar from previous cesarean delivery: Secondary | ICD-10-CM

## 2021-12-09 DIAGNOSIS — O4440 Low lying placenta NOS or without hemorrhage, unspecified trimester: Secondary | ICD-10-CM

## 2021-12-09 DIAGNOSIS — O99350 Diseases of the nervous system complicating pregnancy, unspecified trimester: Secondary | ICD-10-CM

## 2021-12-09 NOTE — Patient Instructions (Signed)
Cesarean Delivery, Care After The following information offers guidance on how to care for yourself after your procedure. Your health care provider may also give you more specific instructions. If you have problems or questions, contact your health care provider. What can I expect after the procedure? After the procedure, it is common to have: A small amount of blood or clear fluid coming from the incision. Some redness, swelling, and pain in your incision area. Some abdominal pain and soreness. Vaginal bleeding (lochia). Even though you did not have a vaginal delivery, you will still have vaginal bleeding and discharge. Pelvic cramps. Fatigue. You may have pain, swelling, and discomfort in the tissue between your vagina and your anus (perineum) if: Your C-section was unplanned, and you were allowed to labor and push. An incision was made in the area (episiotomy) or the tissue tore during attempted vaginal delivery. Follow these instructions at home: Medicines Take over-the-counter and prescription medicines only as told by your health care provider. If you were prescribed an antibiotic medicine, take it as told by your health care provider. Do not stop taking the antibiotic even if you start to feel better. Ask your health care provider if the medicine prescribed to you requires you to avoid driving or using machinery. Incision care  Follow instructions from your health care provider about how to take care of your incision. Make sure you: Wash your hands with soap and water for an least 20 seconds before and after you change your bandage (dressing). If soap and water are not available, use hand sanitizer. If you have a dressing, change it or remove it as told by your health care provider. Leave stitches (sutures), skin staples, skin glue, or adhesive strips in place. These skin closures may need to stay in place for 2 weeks or longer. If adhesive strip edges start to loosen and curl up, you  may trim the loose edges. Do not remove adhesive strips completely unless your health care provider tells you to do that. Check your incision area every day for signs of infection. Check for: More redness, swelling, or pain. More fluid or blood. Warmth. Pus or a bad smell. Do not take baths, swim, or use a hot tub until your health care provider approves. Ask your health care provider if you may take showers. When you cough or sneeze, hug a pillow. This helps with pain and decreases the chance of your incision opening up (dehiscing). Do this until your incision heals. Managing constipation Your procedure may cause constipation. To prevent or treat constipation, you may need to: Drink enough fluid to keep your urine pale yellow. Take over-the-counter or prescription medicines. Eat foods that are high in fiber, such as beans, whole grains, and fresh fruits and vegetables. Limit foods that are high in fat and processed sugars, such as fried or sweet foods. Activity  If possible, have someone help you care for your baby and help with household activities for at least a few days after you leave the hospital. Rest as much as possible. Try to rest or take a nap while your baby is sleeping. You may have to avoid lifting. Ask your health care provider how much you can safely lift. Return to your normal activities as told by your health care provider. Ask your health care provider what activities are safe for you. Talk with your health care provider about when you can engage in sexual activity. This may depend on your: Risk of infection. How fast you heal. Comfort   and desire to engage in sexual activity. Lifestyle Do not drink alcohol. This is especially important if you are breastfeeding or taking pain medicine. Do not use any products that contain nicotine or tobacco. These products include cigarettes, chewing tobacco, and vaping devices, such as e-cigarettes. If you need help quitting, ask your  health care provider. General instructions Do not use tampons or douches until your health care provider approves. Wear loose, comfortable clothing and a supportive and well-fitting bra. If you pass a blood clot, save it and call your health care provider to discuss. Do not flush blood clots down the toilet before you get instructions from your health care provider. Keep all follow-up visits for you and your baby. This is important. Contact a health care provider if: You have: A fever. Dizziness or light-headedness. Bad-smelling vaginal discharge. A blood clot pass from your vagina. Pus, blood, or a bad smell coming from your incision. An incision that feels warm to the touch. More redness, swelling, or pain around your incision. Difficulty or pain when urinating. Nausea or vomiting. Little or no interest in activities you used to enjoy. Your breasts turn red or become painful or hard. You feel unusually sad or worried. You have questions about caring for yourself or your baby. You have redness, swelling, and pain in an arm or leg. Get help right away if: You have: Pain that does not go away or get better with medicine. Chest pain. Trouble breathing. Blurred vision, spots, or flashing lights in your vision. Thoughts about hurting yourself or your baby. New pain in your abdomen or in one of your legs. A severe headache that does not get better with pain medicine. You faint. You bleed from your vagina so much that you fill more than one sanitary pad in one hour. Bleeding should not be heavier than your heaviest period. These symptoms may be an emergency. Get help right away. Call 911. Do not wait to see if the symptoms will go away. Do not drive yourself to the hospital. Get help right away if you feel like you may hurt yourself or others, or have thoughts about taking your own life. Go to your nearest emergency room or: Call 911. Call the National Suicide Prevention Lifeline at  1-800-273-8255 or 988. This is open 24 hours a day. Text the Crisis Text Line at 741741. Summary After the procedure, it is common to have pain at your incision site, abdominal cramping, and slight bleeding from your vagina. Check your incision area every day for signs of infection. Tell your health care provider about any unusual symptoms. Keep all follow-up visits for you and your baby. This is important. This information is not intended to replace advice given to you by your health care provider. Make sure you discuss any questions you have with your health care provider. Document Revised: 02/10/2021 Document Reviewed: 02/10/2021 Elsevier Patient Education  2023 Elsevier Inc.  

## 2021-12-09 NOTE — Progress Notes (Signed)
Subjective:  Brittany Hendrix is a 31 y.o. [phone] at [redacted]w[redacted]d being seen today for ongoing prenatal care.  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  Patient reports general discomforts of pregnancy.  Contractions: Irregular. Vag. Bleeding: None.  Movement: Present. Denies leaking of fluid.   The following portions of the patient's history were reviewed and updated as appropriate: allergies, current medications, past family history, past medical history, past social history, past surgical history and problem list. Problem list updated.  Objective:   Vitals:   12/09/21 1437  BP: 103/65  Pulse: 66  Weight: 223 lb (101.2 kg)    Fetal Status:     Movement: Present     General:  Alert, oriented and cooperative. Patient is in no acute distress.  Skin: Skin is warm and dry. No rash noted.   Cardiovascular: Normal heart rate noted  Respiratory: Normal respiratory effort, no problems with respiration noted  Abdomen: Soft, gravid, appropriate for gestational age. Pain/Pressure: Absent     Pelvic:  Cervical exam deferred        Extremities: Normal range of motion.     Mental Status: Normal mood and affect. Normal behavior. Normal judgment and thought content.   Urinalysis:      Assessment and Plan:  Pregnancy: [id number] at [redacted]w[redacted]d  1. Supervision of high risk pregnancy, antepartum Stable - Culture, beta strep (group b only) - Cervicovaginal ancillary only( [place])  2. Seizure disorder in pregnancy, antepartum  ([place]) Stable  3. Low-lying placenta Placenta previa on last U/S C section at 37 weeks scheduled Precautions reviewed  4. Previous cesarean delivery affecting pregnancy See above  Preterm labor symptoms and general obstetric precautions including but not limited to vaginal bleeding, contractions, leaking of fluid and fetal movement were reviewed in detail with the patient. Please refer to After Visit Summary for other counseling recommendations.  Return in about 1 week (around 12/16/2021) for OB visit, face to face, MD only.   Chancy Milroy, MD

## 2021-12-10 ENCOUNTER — Encounter (HOSPITAL_COMMUNITY): Payer: Self-pay

## 2021-12-10 ENCOUNTER — Telehealth (HOSPITAL_COMMUNITY): Payer: Self-pay | Admitting: *Deleted

## 2021-12-10 ENCOUNTER — Other Ambulatory Visit: Payer: Self-pay | Admitting: Obstetrics and Gynecology

## 2021-12-10 LAB — CERVICOVAGINAL ANCILLARY ONLY
Chlamydia: NEGATIVE
Comment: NEGATIVE
Comment: NORMAL
Neisseria Gonorrhea: NEGATIVE

## 2021-12-10 NOTE — Telephone Encounter (Signed)
Preadmission screen  

## 2021-12-10 NOTE — Patient Instructions (Signed)
Brittany Hendrix  12/10/2021   Your procedure is scheduled on:  12/23/2021  Arrive at 1030 at Entrance C on [address] at [facility]  and [employer]. You are invited to use the FREE valet parking or use the Visitor's parking deck.  Pick up the phone at the desk and dial [phone].  Call this number if you have problems the morning of surgery: [phone]  Remember:   Do not eat food:(After Midnight) Desps de medianoche.  Do not drink clear liquids: (After Midnight) Desps de medianoche.  Take these medicines the morning of surgery with A SIP OF WATER:  Take kepra as prescribed   Do not wear jewelry, make-up or nail polish.  Do not wear lotions, powders, or perfumes. Do not wear deodorant.  Do not shave 48 hours prior to surgery.  Do not bring valuables to the hospital.  [facility] is not   responsible for any belongings or valuables brought to the hospital.  Contacts, dentures or bridgework may not be worn into surgery.  Leave suitcase in the car. After surgery it may be brought to your room.  For patients admitted to the hospital, checkout time is 11:00 AM the day of              discharge.      Please read over the following fact sheets that you were given:     Preparing for Surgery

## 2021-12-13 ENCOUNTER — Inpatient Hospital Stay (HOSPITAL_COMMUNITY)
Admission: AD | Admit: 2021-12-13 | Discharge: 2021-12-13 | Discharge status: Against Medical Advice | Payer: Medicaid Other | Attending: Obstetrics and Gynecology | Admitting: Obstetrics and Gynecology

## 2021-12-13 ENCOUNTER — Encounter (HOSPITAL_COMMUNITY): Payer: Self-pay | Admitting: Obstetrics and Gynecology

## 2021-12-13 ENCOUNTER — Telehealth (HOSPITAL_COMMUNITY): Payer: Self-pay | Admitting: *Deleted

## 2021-12-13 DIAGNOSIS — O9A213 Injury, poisoning and certain other consequences of external causes complicating pregnancy, third trimester: Secondary | ICD-10-CM | POA: Diagnosis not present

## 2021-12-13 DIAGNOSIS — O34219 Maternal care for unspecified type scar from previous cesarean delivery: Secondary | ICD-10-CM

## 2021-12-13 DIAGNOSIS — Z3A35 35 weeks gestation of pregnancy: Secondary | ICD-10-CM | POA: Insufficient documentation

## 2021-12-13 LAB — URINALYSIS, ROUTINE W REFLEX MICROSCOPIC
Bacteria, UA: NONE SEEN
Bilirubin Urine: NEGATIVE
Glucose, UA: NEGATIVE mg/dL
Hgb urine dipstick: NEGATIVE
Ketones, ur: NEGATIVE mg/dL
Leukocytes,Ua: NEGATIVE
Nitrite: NEGATIVE
Protein, ur: NEGATIVE mg/dL
Specific Gravity, Urine: 1.019 (ref 1.005–1.030)
pH: 6 (ref 5.0–8.0)

## 2021-12-13 LAB — CULTURE, BETA STREP (GROUP B ONLY): Strep Gp B Culture: NEGATIVE

## 2021-12-13 NOTE — Telephone Encounter (Signed)
Preadmission screen  
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
## 2021-12-14 ENCOUNTER — Telehealth (HOSPITAL_COMMUNITY): Payer: Self-pay | Admitting: *Deleted

## 2021-12-14 NOTE — Telephone Encounter (Signed)
Preadmission screen  

## 2021-12-15 ENCOUNTER — Encounter (HOSPITAL_COMMUNITY): Payer: Self-pay

## 2021-12-15 ENCOUNTER — Ambulatory Visit (INDEPENDENT_AMBULATORY_CARE_PROVIDER_SITE_OTHER): Payer: Medicaid Other | Admitting: Obstetrics and Gynecology

## 2021-12-15 ENCOUNTER — Encounter: Payer: Self-pay | Admitting: Obstetrics and Gynecology

## 2021-12-15 VITALS — BP 102/67 | HR 80 | Wt 220.2 lb

## 2021-12-15 DIAGNOSIS — O4440 Low lying placenta NOS or without hemorrhage, unspecified trimester: Secondary | ICD-10-CM

## 2021-12-15 DIAGNOSIS — G40909 Epilepsy, unspecified, not intractable, without status epilepticus: Secondary | ICD-10-CM

## 2021-12-15 DIAGNOSIS — Z3A35 35 weeks gestation of pregnancy: Secondary | ICD-10-CM

## 2021-12-15 DIAGNOSIS — O99350 Diseases of the nervous system complicating pregnancy, unspecified trimester: Secondary | ICD-10-CM

## 2021-12-15 DIAGNOSIS — O0990 Supervision of high risk pregnancy, unspecified, unspecified trimester: Secondary | ICD-10-CM

## 2021-12-15 DIAGNOSIS — O34219 Maternal care for unspecified type scar from previous cesarean delivery: Secondary | ICD-10-CM

## 2021-12-15 DIAGNOSIS — O99210 Obesity complicating pregnancy, unspecified trimester: Secondary | ICD-10-CM

## 2021-12-15 NOTE — Progress Notes (Signed)
Pt present for ROB visit with no concerns at this time.  

## 2021-12-15 NOTE — Progress Notes (Signed)
   PRENATAL VISIT NOTE  Subjective:  Brittany Hendrix is a 31 y.o. [phone] at [redacted]w[redacted]d being seen today for ongoing prenatal care.  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  Patient doing well with no acute concerns today. She reports no complaints.  Contractions: Irritability.  .  Movement: Present. Denies leaking of fluid.   The following portions of the patient's history were reviewed and updated as appropriate: allergies, current medications, past family history, past medical history, past social history, past surgical history and problem list. Problem list updated.  Objective:   Vitals:   12/15/21 1311  BP: 102/67  Pulse: 80  Weight: 220 lb 3.2 oz (99.9 kg)    Fetal Status: Fetal Heart Rate (bpm): 142 Fundal Height: 36 cm Movement: Present     General:  Alert, oriented and cooperative. Patient is in no acute distress.  Skin: Skin is warm and dry. No rash noted.   Cardiovascular: Normal heart rate noted  Respiratory: Normal respiratory effort, no problems with respiration noted  Abdomen: Soft, gravid, appropriate for gestational age.  Pain/Pressure: Absent     Pelvic: Cervical exam deferred        Extremities: Normal range of motion.  Edema: None  Mental Status:  Normal mood and affect. Normal behavior. Normal judgment and thought content.   Assessment and Plan:  Pregnancy: [id number] at [redacted]w[redacted]d  1. Previous cesarean delivery affecting pregnancy Pt will have repeat cesarean section  2. [redacted] weeks gestation of  pregnancy   3. Seizure disorder in pregnancy, antepartum ([place]) No report of seizure activity  4. Obesity during pregnancy, antepartum   5. Supervision of high risk pregnancy, antepartum Continue routine care  6. Low-lying placenta Review of scan shows posterior placenta previa, low risk for accreta spectrum Repeat section scheduled for 12/23/21,pt unsure of birth control method  Preterm labor symptoms and general obstetric precautions including but not limited to vaginal bleeding, contractions, leaking of fluid and fetal movement were reviewed in detail with the patient.  Please refer to After Visit Summary for other counseling recommendations.   No follow-ups on file.   Lynnda Shields, MD Faculty Attending Center for [facility]

## 2021-12-21 ENCOUNTER — Encounter (HOSPITAL_COMMUNITY)
Admission: RE | Admit: 2021-12-21 | Discharge: 2021-12-21 | Disposition: A | Discharge status: Home / Self Care | Payer: Medicaid Other | Source: Ambulatory Visit | Attending: Obstetrics and Gynecology | Admitting: Obstetrics and Gynecology

## 2021-12-21 DIAGNOSIS — Z01812 Encounter for preprocedural laboratory examination: Secondary | ICD-10-CM | POA: Insufficient documentation

## 2021-12-21 DIAGNOSIS — O4440 Low lying placenta NOS or without hemorrhage, unspecified trimester: Secondary | ICD-10-CM | POA: Diagnosis not present

## 2021-12-21 LAB — CBC
HCT: 31.9 % — ABNORMAL LOW (ref 36.0–46.0)
Hemoglobin: 10.7 g/dL — ABNORMAL LOW (ref 12.0–15.0)
MCH: 31.8 pg (ref 26.0–34.0)
MCHC: 33.5 g/dL (ref 30.0–36.0)
MCV: 94.7 fL (ref 80.0–100.0)
Platelets: 219 10*3/uL (ref 150–400)
RBC: 3.37 MIL/uL — ABNORMAL LOW (ref 3.87–5.11)
RDW: 13 % (ref 11.5–15.5)
WBC: 7.5 10*3/uL (ref 4.0–10.5)
nRBC: 0 % (ref 0.0–0.2)

## 2021-12-21 LAB — RPR: RPR Ser Ql: NONREACTIVE

## 2021-12-21 LAB — TYPE AND SCREEN
ABO/RH(D): A POS
Antibody Screen: NEGATIVE

## 2021-12-23 ENCOUNTER — Encounter (HOSPITAL_COMMUNITY)
Admission: AD | Disposition: A | Discharge status: Home / Self Care | Payer: Self-pay | Source: Home / Self Care | Attending: Obstetrics & Gynecology

## 2021-12-23 ENCOUNTER — Encounter (HOSPITAL_COMMUNITY): Payer: Self-pay | Admitting: Obstetrics and Gynecology

## 2021-12-23 ENCOUNTER — Inpatient Hospital Stay (HOSPITAL_COMMUNITY)
Admission: AD | Admit: 2021-12-23 | Discharge: 2021-12-26 | DRG: 787 | Disposition: A | Discharge status: Home / Self Care | Payer: Medicaid Other | Attending: Obstetrics & Gynecology | Admitting: Obstetrics & Gynecology

## 2021-12-23 ENCOUNTER — Inpatient Hospital Stay (HOSPITAL_COMMUNITY): Payer: Medicaid Other | Admitting: Anesthesiology

## 2021-12-23 DIAGNOSIS — Z3A37 37 weeks gestation of pregnancy: Secondary | ICD-10-CM | POA: Diagnosis not present

## 2021-12-23 DIAGNOSIS — O9081 Anemia of the puerperium: Secondary | ICD-10-CM | POA: Diagnosis not present

## 2021-12-23 DIAGNOSIS — O99334 Smoking (tobacco) complicating childbirth: Secondary | ICD-10-CM | POA: Diagnosis present

## 2021-12-23 DIAGNOSIS — O99214 Obesity complicating childbirth: Secondary | ICD-10-CM | POA: Diagnosis present

## 2021-12-23 DIAGNOSIS — O34219 Maternal care for unspecified type scar from previous cesarean delivery: Secondary | ICD-10-CM | POA: Diagnosis present

## 2021-12-23 DIAGNOSIS — O99354 Diseases of the nervous system complicating childbirth: Secondary | ICD-10-CM | POA: Diagnosis present

## 2021-12-23 DIAGNOSIS — O34211 Maternal care for low transverse scar from previous cesarean delivery: Secondary | ICD-10-CM | POA: Diagnosis present

## 2021-12-23 DIAGNOSIS — O0990 Supervision of high risk pregnancy, unspecified, unspecified trimester: Secondary | ICD-10-CM

## 2021-12-23 DIAGNOSIS — O4403 Placenta previa specified as without hemorrhage, third trimester: Secondary | ICD-10-CM | POA: Diagnosis not present

## 2021-12-23 DIAGNOSIS — O4443 Low lying placenta NOS or without hemorrhage, third trimester: Secondary | ICD-10-CM | POA: Diagnosis present

## 2021-12-23 DIAGNOSIS — D62 Acute posthemorrhagic anemia: Secondary | ICD-10-CM | POA: Diagnosis not present

## 2021-12-23 DIAGNOSIS — F17210 Nicotine dependence, cigarettes, uncomplicated: Secondary | ICD-10-CM | POA: Diagnosis present

## 2021-12-23 DIAGNOSIS — E669 Obesity, unspecified: Secondary | ICD-10-CM | POA: Diagnosis present

## 2021-12-23 DIAGNOSIS — G40909 Epilepsy, unspecified, not intractable, without status epilepticus: Secondary | ICD-10-CM

## 2021-12-23 DIAGNOSIS — O4440 Low lying placenta NOS or without hemorrhage, unspecified trimester: Secondary | ICD-10-CM | POA: Diagnosis present

## 2021-12-23 LAB — CREATININE, SERUM
Creatinine, Ser: 0.61 mg/dL (ref 0.44–1.00)
GFR, Estimated: >60 mL/min (ref >60)

## 2021-12-23 SURGERY — Surgical Case
Anesthesia: Spinal

## 2021-12-23 MED ORDER — STERILE WATER FOR IRRIGATION IR SOLN
Status: DC | PRN
  Administered 2021-12-23: 1

## 2021-12-23 MED ORDER — FENTANYL CITRATE (PF) 100 MCG/2ML IJ SOLN
INTRAMUSCULAR | Status: AC
  Filled 2021-12-23: qty 2

## 2021-12-23 MED ORDER — OXYCODONE HCL 5 MG PO TABS: 5.0000 mg | ORAL_TABLET | Freq: Once | ORAL | Status: DC | PRN

## 2021-12-23 MED ORDER — NALOXONE HCL 0.4 MG/ML IJ SOLN: 0.4000 mg | INTRAMUSCULAR | Status: DC | PRN

## 2021-12-23 MED ORDER — SIMETHICONE 80 MG PO CHEW
80.0000 mg | CHEWABLE_TABLET | Freq: Three times a day (TID) | ORAL | Status: DC
  Administered 2021-12-24 – 2021-12-26 (×6): 80 mg via ORAL
  Filled 2021-12-23 (×6): qty 1

## 2021-12-23 MED ORDER — PROMETHAZINE HCL 25 MG/ML IJ SOLN: 6.2500 mg | INTRAMUSCULAR | Status: DC | PRN

## 2021-12-23 MED ORDER — KETOROLAC TROMETHAMINE 30 MG/ML IJ SOLN: 30.0000 mg | Freq: Four times a day (QID) | INTRAMUSCULAR | Status: AC | PRN

## 2021-12-23 MED ORDER — OXYTOCIN-SODIUM CHLORIDE 30-0.9 UT/500ML-% IV SOLN: 2.5000 [IU]/h | INTRAVENOUS | Status: AC

## 2021-12-23 MED ORDER — MEPERIDINE HCL 25 MG/ML IJ SOLN: 6.2500 mg | INTRAMUSCULAR | Status: DC | PRN

## 2021-12-23 MED ORDER — DEXAMETHASONE SODIUM PHOSPHATE 4 MG/ML IJ SOLN
INTRAMUSCULAR | Status: AC
  Filled 2021-12-23: qty 1

## 2021-12-23 MED ORDER — IBUPROFEN 600 MG PO TABS
600.0000 mg | ORAL_TABLET | Freq: Four times a day (QID) | ORAL | Status: DC
  Administered 2021-12-24 – 2021-12-26 (×7): 600 mg via ORAL
  Filled 2021-12-23 (×7): qty 1

## 2021-12-23 MED ORDER — FENTANYL CITRATE (PF) 100 MCG/2ML IJ SOLN
INTRAMUSCULAR | Status: DC | PRN
Start: 2021-12-23 — End: 2021-12-23
  Administered 2021-12-23: 60 ug via INTRAVENOUS

## 2021-12-23 MED ORDER — SODIUM CHLORIDE 0.9% FLUSH: 3.0000 mL | INTRAVENOUS | Status: DC | PRN

## 2021-12-23 MED ORDER — SODIUM CHLORIDE 0.9 % IV SOLN
2.0000 g | INTRAVENOUS | Status: AC
  Administered 2021-12-23: 2 g via INTRAVENOUS

## 2021-12-23 MED ORDER — KETOROLAC TROMETHAMINE 30 MG/ML IJ SOLN
INTRAMUSCULAR | Status: AC
  Filled 2021-12-23: qty 1

## 2021-12-23 MED ORDER — SENNOSIDES-DOCUSATE SODIUM 8.6-50 MG PO TABS
2.0000 | ORAL_TABLET | Freq: Every day | ORAL | Status: DC
  Administered 2021-12-24 – 2021-12-26 (×3): 2 via ORAL
  Filled 2021-12-23 (×3): qty 2

## 2021-12-23 MED ORDER — SCOPOLAMINE 1 MG/3DAYS TD PT72
1.0000 | MEDICATED_PATCH | Freq: Once | TRANSDERMAL | Status: AC
  Administered 2021-12-23: 1.5 mg via TRANSDERMAL

## 2021-12-23 MED ORDER — DEXAMETHASONE SODIUM PHOSPHATE 4 MG/ML IJ SOLN
INTRAMUSCULAR | Status: DC | PRN
  Administered 2021-12-23: 4 mg via INTRAVENOUS

## 2021-12-23 MED ORDER — MORPHINE SULFATE (PF) 0.5 MG/ML IJ SOLN
INTRAMUSCULAR | Status: DC | PRN
  Administered 2021-12-23: 150 ug via INTRATHECAL

## 2021-12-23 MED ORDER — TRANEXAMIC ACID-NACL 1000-0.7 MG/100ML-% IV SOLN
INTRAVENOUS | Status: AC
  Filled 2021-12-23: qty 100

## 2021-12-23 MED ORDER — COCONUT OIL OIL: 1.0000 "application " | TOPICAL_OIL | Status: DC | PRN

## 2021-12-23 MED ORDER — POVIDONE-IODINE 10 % EX SWAB
2.0000 "application " | Freq: Once | CUTANEOUS | Status: AC
  Administered 2021-12-23: 2 via TOPICAL

## 2021-12-23 MED ORDER — OXYCODONE HCL 5 MG PO TABS
5.0000 mg | ORAL_TABLET | ORAL | Status: DC | PRN
  Administered 2021-12-24 (×2): 5 mg via ORAL
  Administered 2021-12-25 (×3): 10 mg via ORAL
  Administered 2021-12-26: 5 mg via ORAL
  Administered 2021-12-26: 10 mg via ORAL
  Administered 2021-12-26: 5 mg via ORAL
  Filled 2021-12-23: qty 1
  Filled 2021-12-23: qty 2
  Filled 2021-12-23 (×5): qty 1
  Filled 2021-12-23 (×2): qty 2
  Filled 2021-12-23: qty 1

## 2021-12-23 MED ORDER — MORPHINE SULFATE (PF) 0.5 MG/ML IJ SOLN
INTRAMUSCULAR | Status: AC
  Filled 2021-12-23: qty 10

## 2021-12-23 MED ORDER — SOD CITRATE-CITRIC ACID 500-334 MG/5ML PO SOLN
30.0000 mL | ORAL | Status: AC
  Administered 2021-12-23: 30 mL via ORAL

## 2021-12-23 MED ORDER — WITCH HAZEL-GLYCERIN EX PADS: 1.0000 "application " | MEDICATED_PAD | CUTANEOUS | Status: DC | PRN

## 2021-12-23 MED ORDER — ENOXAPARIN SODIUM 40 MG/0.4ML IJ SOSY
40.0000 mg | PREFILLED_SYRINGE | INTRAMUSCULAR | Status: DC
  Administered 2021-12-24 – 2021-12-26 (×3): 40 mg via SUBCUTANEOUS
  Filled 2021-12-23 (×3): qty 0.4

## 2021-12-23 MED ORDER — LACTATED RINGERS IV SOLN: INTRAVENOUS | Status: DC

## 2021-12-23 MED ORDER — SODIUM CHLORIDE 0.9 % IR SOLN
Status: DC | PRN
  Administered 2021-12-23: 1

## 2021-12-23 MED ORDER — ACETAMINOPHEN 500 MG PO TABS
1000.0000 mg | ORAL_TABLET | Freq: Four times a day (QID) | ORAL | Status: DC
  Administered 2021-12-23 – 2021-12-26 (×11): 1000 mg via ORAL
  Filled 2021-12-23 (×12): qty 2

## 2021-12-23 MED ORDER — BUPIVACAINE IN DEXTROSE 0.75-8.25 % IT SOLN
INTRATHECAL | Status: DC | PRN
  Administered 2021-12-23: 1.6 mL via INTRATHECAL

## 2021-12-23 MED ORDER — DIBUCAINE (PERIANAL) 1 % EX OINT: 1.0000 "application " | TOPICAL_OINTMENT | CUTANEOUS | Status: DC | PRN

## 2021-12-23 MED ORDER — TRANEXAMIC ACID-NACL 1000-0.7 MG/100ML-% IV SOLN
1000.0000 mg | INTRAVENOUS | Status: AC
  Administered 2021-12-23: 1000 mg via INTRAVENOUS

## 2021-12-23 MED ORDER — SODIUM CHLORIDE 0.9 % IV SOLN
INTRAVENOUS | Status: AC
  Filled 2021-12-23: qty 2

## 2021-12-23 MED ORDER — ACETAMINOPHEN 500 MG PO TABS
1000.0000 mg | ORAL_TABLET | ORAL | Status: AC
  Administered 2021-12-23: 1000 mg via ORAL

## 2021-12-23 MED ORDER — PHENYLEPHRINE HCL (PRESSORS) 10 MG/ML IV SOLN
INTRAVENOUS | Status: DC | PRN
  Administered 2021-12-23: 80 ug via INTRAVENOUS

## 2021-12-23 MED ORDER — DIPHENHYDRAMINE HCL 25 MG PO CAPS
25.0000 mg | ORAL_CAPSULE | ORAL | Status: DC | PRN
  Administered 2021-12-23 – 2021-12-24 (×2): 25 mg via ORAL
  Filled 2021-12-23 (×2): qty 1

## 2021-12-23 MED ORDER — KETOROLAC TROMETHAMINE 30 MG/ML IJ SOLN
30.0000 mg | Freq: Four times a day (QID) | INTRAMUSCULAR | Status: AC
  Administered 2021-12-23 – 2021-12-24 (×4): 30 mg via INTRAVENOUS
  Filled 2021-12-23 (×5): qty 1

## 2021-12-23 MED ORDER — ACETAMINOPHEN 10 MG/ML IV SOLN: 1000.0000 mg | Freq: Once | INTRAVENOUS | Status: DC | PRN

## 2021-12-23 MED ORDER — PHENYLEPHRINE HCL-NACL 20-0.9 MG/250ML-% IV SOLN
INTRAVENOUS | Status: DC | PRN
  Administered 2021-12-23: 60 ug/min via INTRAVENOUS

## 2021-12-23 MED ORDER — ONDANSETRON HCL 4 MG/2ML IJ SOLN
INTRAMUSCULAR | Status: DC | PRN
  Administered 2021-12-23: 4 mg via INTRAVENOUS

## 2021-12-23 MED ORDER — OXYCODONE HCL 5 MG/5ML PO SOLN: 5.0000 mg | Freq: Once | ORAL | Status: DC | PRN

## 2021-12-23 MED ORDER — DIPHENHYDRAMINE HCL 25 MG PO CAPS: 25.0000 mg | ORAL_CAPSULE | Freq: Four times a day (QID) | ORAL | Status: DC | PRN

## 2021-12-23 MED ORDER — MAGNESIUM HYDROXIDE 400 MG/5ML PO SUSP: 30.0000 mL | ORAL | Status: DC | PRN

## 2021-12-23 MED ORDER — NALOXONE HCL 4 MG/10ML IJ SOLN: 1.0000 ug/kg/h | INTRAVENOUS | Status: DC | PRN

## 2021-12-23 MED ORDER — ONDANSETRON HCL 4 MG/2ML IJ SOLN: 4.0000 mg | Freq: Three times a day (TID) | INTRAMUSCULAR | Status: DC | PRN

## 2021-12-23 MED ORDER — ACETAMINOPHEN 500 MG PO TABS
ORAL_TABLET | ORAL | Status: AC
  Filled 2021-12-23: qty 2

## 2021-12-23 MED ORDER — KETOROLAC TROMETHAMINE 15 MG/ML IJ SOLN
15.0000 mg | INTRAMUSCULAR | Status: AC
  Administered 2021-12-23: 15 mg via INTRAVENOUS
  Filled 2021-12-23: qty 1

## 2021-12-23 MED ORDER — HYDROMORPHONE HCL 1 MG/ML IJ SOLN: 0.2500 mg | INTRAMUSCULAR | Status: DC | PRN

## 2021-12-23 MED ORDER — PHENYLEPHRINE HCL-NACL 20-0.9 MG/250ML-% IV SOLN
INTRAVENOUS | Status: AC
  Filled 2021-12-23: qty 250

## 2021-12-23 MED ORDER — ONDANSETRON HCL 4 MG/2ML IJ SOLN
INTRAMUSCULAR | Status: AC
  Filled 2021-12-23: qty 2

## 2021-12-23 MED ORDER — MEASLES, MUMPS & RUBELLA VAC IJ SOLR: 0.5000 mL | Freq: Once | INTRAMUSCULAR | Status: DC

## 2021-12-23 MED ORDER — PRENATAL MULTIVITAMIN CH
1.0000 | ORAL_TABLET | Freq: Every day | ORAL | Status: DC
  Administered 2021-12-24 – 2021-12-26 (×3): 1 via ORAL
  Filled 2021-12-23 (×3): qty 1

## 2021-12-23 MED ORDER — LEVETIRACETAM 500 MG PO TABS
1000.0000 mg | ORAL_TABLET | Freq: Two times a day (BID) | ORAL | Status: DC
  Administered 2021-12-23 – 2021-12-26 (×6): 1000 mg via ORAL
  Filled 2021-12-23 (×8): qty 2

## 2021-12-23 MED ORDER — SCOPOLAMINE 1 MG/3DAYS TD PT72
MEDICATED_PATCH | TRANSDERMAL | Status: AC
  Filled 2021-12-23: qty 1

## 2021-12-23 MED ORDER — MEDROXYPROGESTERONE ACETATE 150 MG/ML IM SUSP
150.0000 mg | INTRAMUSCULAR | Status: DC | PRN
Start: 2021-12-23 — End: 2021-12-26

## 2021-12-23 MED ORDER — OXYTOCIN-SODIUM CHLORIDE 30-0.9 UT/500ML-% IV SOLN
INTRAVENOUS | Status: AC
  Filled 2021-12-23: qty 500

## 2021-12-23 MED ORDER — OXYTOCIN-SODIUM CHLORIDE 30-0.9 UT/500ML-% IV SOLN
INTRAVENOUS | Status: DC | PRN
  Administered 2021-12-23: 300 mL via INTRAVENOUS

## 2021-12-23 MED ORDER — SIMETHICONE 80 MG PO CHEW: 80.0000 mg | CHEWABLE_TABLET | ORAL | Status: DC | PRN

## 2021-12-23 MED ORDER — MORPHINE SULFATE (PF) 0.5 MG/ML IJ SOLN: INTRAMUSCULAR | Status: DC | PRN

## 2021-12-23 MED ORDER — TETANUS-DIPHTH-ACELL PERTUSSIS 5-2.5-18.5 LF-MCG/0.5 IM SUSY: 0.5000 mL | PREFILLED_SYRINGE | Freq: Once | INTRAMUSCULAR | Status: DC

## 2021-12-23 MED ORDER — MENTHOL 3 MG MT LOZG: 1.0000 | LOZENGE | OROMUCOSAL | Status: DC | PRN

## 2021-12-23 MED ORDER — SOD CITRATE-CITRIC ACID 500-334 MG/5ML PO SOLN
ORAL | Status: AC
  Filled 2021-12-23: qty 30

## 2021-12-23 MED ORDER — DIPHENHYDRAMINE HCL 50 MG/ML IJ SOLN: 12.5000 mg | INTRAMUSCULAR | Status: DC | PRN

## 2021-12-23 MED ORDER — FENTANYL CITRATE (PF) 100 MCG/2ML IJ SOLN
INTRAMUSCULAR | Status: DC | PRN
Start: 2021-12-23 — End: 2021-12-23
  Administered 2021-12-23: 15 ug via INTRATHECAL

## 2021-12-23 SURGICAL SUPPLY — 35 items
BENZOIN TINCTURE PRP APPL 2/3 (GAUZE/BANDAGES/DRESSINGS) ×2 IMPLANT
CHLORAPREP W/TINT 26ML (MISCELLANEOUS) ×4 IMPLANT
CLAMP CORD UMBIL (MISCELLANEOUS) ×2 IMPLANT
CLOSURE STERI STRIP 1/2 X4 (GAUZE/BANDAGES/DRESSINGS) ×1 IMPLANT
CLOTH BEACON ORANGE TIMEOUT ST (SAFETY) ×2 IMPLANT
DRSG OPSITE POSTOP 4X10 (GAUZE/BANDAGES/DRESSINGS) ×2 IMPLANT
ELECT REM PT RETURN 9FT ADLT (ELECTROSURGICAL) ×2
ELECTRODE REM PT RTRN 9FT ADLT (ELECTROSURGICAL) ×1 IMPLANT
EXTRACTOR VACUUM M CUP 4 TUBE (SUCTIONS) IMPLANT
GLOVE BIOGEL PI IND STRL 7.0 (GLOVE) ×2 IMPLANT
GLOVE BIOGEL PI IND STRL 7.5 (GLOVE) ×2 IMPLANT
GLOVE BIOGEL PI INDICATOR 7.0 (GLOVE) ×2
GLOVE BIOGEL PI INDICATOR 7.5 (GLOVE) ×2
GLOVE ECLIPSE 7.5 STRL STRAW (GLOVE) ×2 IMPLANT
GOWN STRL REUS W/TWL LRG LVL3 (GOWN DISPOSABLE) ×6 IMPLANT
HEMOSTAT ARISTA ABSORB 3G PWDR (HEMOSTASIS) ×1 IMPLANT
KIT ABG SYR 3ML LUER SLIP (SYRINGE) IMPLANT
NDL HYPO 25X5/8 SAFETYGLIDE (NEEDLE) IMPLANT
NEEDLE HYPO 25X5/8 SAFETYGLIDE (NEEDLE) IMPLANT
NS IRRIG 1000ML POUR BTL (IV SOLUTION) ×2 IMPLANT
PACK C SECTION WH (CUSTOM PROCEDURE TRAY) ×2 IMPLANT
PAD OB MATERNITY 4.3X12.25 (PERSONAL CARE ITEMS) ×2 IMPLANT
RTRCTR C-SECT PINK 25CM LRG (MISCELLANEOUS) ×2 IMPLANT
STRIP CLOSURE SKIN 1/2X4 (GAUZE/BANDAGES/DRESSINGS) ×2 IMPLANT
SUT PLAIN 0 NONE (SUTURE) ×2 IMPLANT
SUT PLAIN 2 0 XLH (SUTURE) ×1 IMPLANT
SUT VIC AB 0 CT1 36 (SUTURE) ×6 IMPLANT
SUT VIC AB 0 CTX 36 (SUTURE) ×6
SUT VIC AB 0 CTX36XBRD ANBCTRL (SUTURE) IMPLANT
SUT VIC AB 2-0 CT1 27 (SUTURE) ×2
SUT VIC AB 2-0 CT1 TAPERPNT 27 (SUTURE) ×1 IMPLANT
SUT VIC AB 4-0 KS 27 (SUTURE) ×2 IMPLANT
TOWEL OR 17X24 6PK STRL BLUE (TOWEL DISPOSABLE) ×2 IMPLANT
TRAY FOLEY W/BAG SLVR 14FR LF (SET/KITS/TRAYS/PACK) ×2 IMPLANT
WATER STERILE IRR 1000ML POUR (IV SOLUTION) ×2 IMPLANT

## 2021-12-23 NOTE — Anesthesia Procedure Notes (Signed)
Spinal ° °Patient location during procedure: OR °Reason for block: surgical anesthesia °Staffing °Performed: anesthesiologist  °Anesthesiologist: Delaney Schnick E, MD °Preanesthetic Checklist °Completed: patient identified, IV checked, risks and benefits discussed, surgical consent, monitors and equipment checked, pre-op evaluation and timeout performed °Spinal Block °Patient position: sitting °Prep: DuraPrep and site prepped and draped °Patient monitoring: continuous pulse ox, blood pressure and heart rate °Approach: midline °Location: L3-4 °Injection technique: single-shot °Needle °Needle type: Pencan  °Needle gauge: 24 G °Needle length: 10 cm °Assessment °Events: CSF return °Additional Notes °Functioning IV was confirmed and monitors were applied. Sterile prep and drape, including hand hygiene and sterile gloves were used. The patient was positioned and the spine was prepped. The skin was anesthetized with lidocaine.  Free flow of clear CSF was obtained prior to injecting local anesthetic into the CSF. The needle was carefully withdrawn. The patient tolerated the procedure well.  ° ° ° °
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
## 2021-12-23 NOTE — Lactation Note (Signed)
This note was copied from a baby's chart. Lactation Consultation Note  Patient Name: Brittany Hendrix POEUM'P Date: 12/23/2021   Age:31 hours  LC checked with RN, Vivien Rota-- Mom states tired now mostly bottle feeding. She would like to see Lactation later this evening.  Mom on Keppra (L2 [place] Slider book)   Maternal Data    Feeding Nipple Type: Slow - flow  LATCH Score                    Lactation Tools Discussed/Used    Interventions    Discharge    Consult Status      Brittany Hendrix  Brittany Hendrix 12/23/2021, 6:41 PM

## 2021-12-23 NOTE — Anesthesia Postprocedure Evaluation (Signed)
Anesthesia Post Note  Patient: Brittany Hendrix  Procedure(s) Performed: CESAREAN SECTION     Patient location during evaluation: PACU Anesthesia Type: Spinal Level of consciousness: oriented and awake and alert Pain management: pain level controlled Vital Signs Assessment: post-procedure vital signs reviewed and stable Respiratory status: spontaneous breathing, respiratory function stable and nonlabored ventilation Cardiovascular status: blood pressure returned to baseline and stable Postop Assessment: no headache, no backache, no apparent nausea or vomiting and spinal receding Anesthetic complications: no   No notable events documented.  Last Vitals:  Vitals:   12/23/21 1524 12/23/21 1630  BP: (!) 97/50 99/63  Pulse: (!) 56   Resp:  20  Temp: 36.4 C 36.6 C  SpO2: 100% 97%    Last Pain:  Vitals:   12/23/21 1630  TempSrc: Oral  PainSc: 4    Pain Goal:                Epidural/Spinal Function Cutaneous sensation: Tingles (12/23/21 1630), Patient able to flex knees: Yes (12/23/21 1630), Patient able to lift hips off bed: Yes (12/23/21 1630), Back pain beyond tenderness at insertion site: No (12/23/21 1630), Progressively worsening motor and/or sensory loss: No (12/23/21 1630), Bowel and/or bladder incontinence post epidural: No (12/23/21 1630)  Lidia Collum

## 2021-12-23 NOTE — H&P (Signed)
Obstetric Preoperative History and Physical  Brittany Hendrix is a 31 y.o. [id number] with IUP at [redacted]w[redacted]d presenting for scheduled cesarean section.  Reports good fetal movement, no bleeding, no contractions, no leaking of fluid.  No acute preoperative concerns.    Cesarean Section Indication:  placenta previa and prior C-section  Prenatal Course  Pregnancy complications or risks: -placenta previa -prior C-section -h/o seizure d/o on Keppra -tobacco use  She breast and bottle feed She desires oral progesterone-only contraceptive for postpartum contraception.   Prenatal labs and studies: ABO, Rh: --/--/A POS (05/30 4540) Antibody: NEG (05/30 0942) Rubella: 5.25 (11/23 1125) RPR: NON REACTIVE (05/30 0944)  HBsAg: Negative (11/23 1125)  HIV: Non Reactive (03/31 1135)  JWJ:XBJYNWGN/-- (05/18 1453) 1 hr Glucola  normal Genetic screening normal Anatomy US normal  Prenatal Transfer Tool  Maternal Diabetes: No Genetic Screening: Normal Maternal Ultrasounds/Referrals: Normal Fetal Ultrasounds or other Referrals:  None Maternal Substance Abuse:  Yes:  Type: Smoker Significant Maternal Medications:  Meds include: Other: Keppra Significant Maternal Lab Results: Group B Strep negative  Past Medical History:  Diagnosis Date   Abortion history    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 [ ]  Inform patient Per up-to-date "considered to be a normal variant. Patients are asymptomatic, have no compromise of fertility, and similar pregnancy outcomes as those in the general obstetric population. "    Headache(784.0)    Seizures (HCC)    Last seizure May 25 2020    Past Surgical History:  Procedure Laterality Date   CESAREAN SECTION N/A 05/20/2020   Procedure: CESAREAN SECTION;  Surgeon: 05/22/2020, MD;  Location: [facility] ORS;  Service: Obstetrics;  Laterality: N/A;   DILATION AND CURETTAGE OF UTERUS     abortion   DILATION AND EVACUATION N/A 10/04/2017   Procedure:  DILATATION AND EVACUATION;  Surgeon: 10/06/2017, MD;  Location: [facility] ORS;  Service: Gynecology;  Laterality: N/A;   DILATION AND EVACUATION N/A 12/23/2020   Procedure: DILATATION AND EVACUATION;  Surgeon: 02/22/2021, MD;  Location: [facility] OR;  Service: Gynecology;  Laterality: N/A;   OPERATIVE ULTRASOUND N/A 12/23/2020   Procedure: OPERATIVE ULTRASOUND;  Surgeon: 02/22/2021, MD;  Location: [facility] OR;  Service: Gynecology;  Laterality: N/A;    OB History  Gravida Para Term Preterm AB Living  9 2 2  0 6 2  SAB IAB Ectopic Multiple Live Births  5 1 0 0 2    # Outcome Date GA Lbr Len/2nd Weight Sex Delivery Anes PTL Lv  9 Current           8 Term 05/20/20 [redacted]w[redacted]d  2900 g M CS-LTranv Spinal  LIV  7 SAB 2019     SAB     6 Term 12/27/15 [redacted]w[redacted]d 21:31 / 00:42 3799 g M Vag-Spont EPI  LIV  5 SAB           4 SAB              Birth Comments: System Generated. Please review and update pregnancy details.  3 SAB           2 SAB           1 IAB             Social History   Socioeconomic History   Marital status: Single    Spouse name: Not on file   Number of children: 0   Years of education: 12   Highest education  level: Not on file  Occupational History   Occupation: LobbyistCASHIER    Employer: [employer]    Comment: [employer]  Tobacco Use   Smoking status: Every Day    Packs/day: 0.50    Years: 5.00    Pack years: 2.50    Types: Cigarettes   Smokeless tobacco: Never  Vaping Use   Vaping Use: Never used  Substance and Sexual Activity   Alcohol use: Not Currently    Comment: occasional, not while preg   Drug use: Not Currently    Types: Marijuana    Comment: last used end of march 2023   Sexual activity: Not Currently    Partners: Male    Birth control/protection: None  Other Topics Concern   Not on file  Social History Narrative   Patient is single and lives at home alone. Patient works at [employer]. Right handed. Caffeine three daily.   Social Determinants of Health    Financial Resource Strain: Not on file  Food Insecurity: Not on file  Transportation Needs: Not on file  Physical Activity: Not on file  Stress: Not on file  Social Connections: Not on file    Family History  Problem Relation Age of Onset   Diabetes Mother    Asthma Brother     Medications Prior to Admission  Medication Sig Dispense Refill Last Dose   levETIRAcetam (KEPPRA) 1000 MG tablet Take 1 tablet (1,000 mg total) by mouth 2 (two) times daily. 180 tablet 4 12/23/2021    No Known Allergies  Review of Systems: Pertinent items noted in HPI and remainder of comprehensive ROS otherwise negative.  Physical Exam: BP 112/72   Pulse 82   Temp 98.4 F (36.9 C) (Oral)   Resp 16   Ht 5\' 4"  (1.626 m)   Wt 99.8 kg   LMP 04/08/2021 Comment: SAB  SpO2 99%   BMI 37.76 kg/m  FHR by Doppler: 135 bpm CONSTITUTIONAL: Well-developed, well-nourished female in no acute distress.  HENT:  Normocephalic, atraumatic, External right and left ear normal. Oropharynx is clear and moist EYES: Conjunctivae and EOM are normal. Pupils are equal, round, and reactive to light. No scleral icterus.  NECK: Normal range of motion, supple, no masses SKIN: Skin is warm and dry. No rash noted. Not diaphoretic. No erythema. No pallor. NEUROLGIC: Alert and oriented to person, place, and time. Normal reflexes, muscle tone coordination. No cranial nerve deficit noted. PSYCHIATRIC: Normal mood and affect. Normal behavior. Normal judgment and thought content. CARDIOVASCULAR: Normal heart rate noted, regular rhythm RESPIRATORY: Effort and breath sounds normal, no problems with respiration noted ABDOMEN: Soft, nontender, nondistended, gravid. Well-healed Pfannenstiel incision. PELVIC: Deferred MUSCULOSKELETAL: Normal range of motion. No edema and no tenderness. 2+ distal pulses.  Pertinent Labs/Studies:   Results for orders placed or performed during the hospital encounter of 12/21/21 (from the past 72 hour(s))   Type and screen     Status: None   Collection Time: 12/21/21  9:42 AM  Result Value Ref Range   ABO/RH(D) A POS    Antibody Screen NEG    Sample Expiration      12/24/2021,2359 Performed at [facility], [address] [place], [place] [address]   RPR     Status: None   Collection Time: 12/21/21  9:44 AM  Result Value Ref Range   RPR Ser Ql NON REACTIVE NON REACTIVE    Comment: Performed at [facility], [address] [place], [place] [address]  CBC     Status: Abnormal   Collection Time: 12/21/21  9:44 AM  Result Value Ref Range   WBC 7.5 4.0 - 10.5 K/uL   RBC 3.37 (L) 3.87 - 5.11 MIL/uL   Hemoglobin 10.7 (L) 12.0 - 15.0 g/dL   HCT 19.1 (L) 47.8 - 29.5 %   MCV 94.7 80.0 - 100.0 fL   MCH 31.8 26.0 - 34.0 pg   MCHC 33.5 30.0 - 36.0 g/dL   RDW 62.1 30.8 - 65.7 %   Platelets 219 150 - 400 K/uL   nRBC 0.0 0.0 - 0.2 %    Comment: Performed at [facility], [address], [place], [place] [address]    Assessment and Plan: Brittany Hendrix is a 31 y.o. [id number] at [redacted]w[redacted]d being admitted for scheduled cesarean section. The risks of surgery were discussed with the patient including but were not limited to: bleeding which may require transfusion or reoperation; infection which may require antibiotics; injury to bowel, bladder, ureters or other surrounding organs; injury to the fetus; need for additional procedures including hysterectomy in the event of a life-threatening hemorrhage; formation of adhesions; placental abnormalities wth subsequent pregnancies; incisional problems; thromboembolic phenomenon and other postoperative/anesthesia complications. Discussed risk of placenta acreta and possible need for hysterectomy.  The patient concurred with the proposed plan, giving informed written consent for the procedure. Patient has been NPO since last night she will remain NPO for procedure. Anesthesia and OR aware. Preoperative prophylactic antibiotics and SCDs ordered on call  to the OR. To OR when ready.   Katha Hamming, DO Obstetrician & Gynecologist, [facility] for [employer], [facility]

## 2021-12-23 NOTE — Discharge Summary (Signed)
   Postpartum Discharge Summary      Patient Name: Brittany Hendrix DOB: 11/20/1990 MRN: [id number]  Date of admission: 12/23/2021 Delivery date:12/23/2021  Delivering provider: OZAN, JENNIFER  Date of discharge: 12/25/2021  Admitting diagnosis: Delivery of pregnancy by cesarean section [O82] Intrauterine pregnancy: [redacted]w[redacted]d     Secondary diagnosis:  Principal Problem:   Delivery of pregnancy by cesarean section Active Problems:   Seizure disorder in pregnancy, antepartum (HCC)   Obesity (BMI 35.0-39.9 without comorbidity)   Low-lying placenta  Additional problems: none    Discharge diagnosis: Term Pregnancy Delivered                                              Post partum procedures: IV Venofer given on POD#1 Augmentation: N/A Complications: None  Hospital course: Scheduled C/S   30 y.o. yo [id number] at [redacted]w[redacted]d was admitted to the hospital 12/23/2021 for scheduled cesarean section with the following indication:Previa.Delivery details are as follows:  Membrane Rupture Time/Date: 1:26 PM ,12/23/2021   Delivery Method:C-Section, Low Transverse  Details of operation can be found in separate operative note.  Patient had an uncomplicated postpartum course with the addition of a Venofer infusion on POD#1 due to Hgb of 7.9.  She is ambulating, tolerating a regular diet, passing flatus, and urinating well. Patient is discharged home in stable condition on  12/25/21        Newborn Data: Birth date:12/23/2021  Birth time:1:27 PM  Gender:Female  Living status:Living  Apgars:9 ,9  Weight:2730 g (6lb 0.3oz)    Magnesium Sulfate received: No BMZ received: No Rhophylac:No MMR:No T-DaP:Given prenatally Flu: No Transfusion:No  Physical exam  Vitals:   12/24/21 0630 12/24/21 1335 12/24/21 2200 12/25/21 0614  BP: (!) 88/58  (!) 96/53 (!) 92/50  Pulse: (!) 52  (!) 58 (!) 48  Resp: 16  16 18  Temp: 98.1 F (36.7 C) 97.8 F (36.6 C) 97.8 F (36.6 C) 98.1 F (36.7 C)  TempSrc: Oral Oral Oral Oral   SpO2: 99%  100% 100%  Weight:      Height:       General: alert and cooperative Lochia: appropriate Uterine Fundus: firm Incision: honeycomb dry and intact DVT Evaluation: No evidence of DVT seen on physical exam. Labs: Lab Results  Component Value Date   WBC 8.0 12/24/2021   HGB 7.9 (L) 12/24/2021   HCT 24.1 (L) 12/24/2021   MCV 94.5 12/24/2021   PLT 152 12/24/2021      Latest Ref Rng & Units 12/23/2021    4:38 PM  CMP  Creatinine 0.44 - 1.00 mg/dL 0.61     Edinburgh Score:    12/23/2021    8:41 PM  Edinburgh Postnatal Depression Scale Screening Tool  I have been able to laugh and see the funny side of things. 0  I have looked forward with enjoyment to things. 0  I have blamed myself unnecessarily when things went wrong. 0  I have been anxious or worried for no good reason. 0  I have felt scared or panicky for no good reason. 0  Things have been getting on top of me. 0  I have been so unhappy that I have had difficulty sleeping. 0  I have felt sad or miserable. 0  I have been so unhappy that I have been crying. 0  The thought of harming myself has      Postpartum Discharge Summary      Patient Name: Brittany Hendrix DOB: 11/20/1990 MRN: [id number]  Date of admission: 12/23/2021 Delivery date:12/23/2021  Delivering provider: OZAN, JENNIFER  Date of discharge: 12/25/2021  Admitting diagnosis: Delivery of pregnancy by cesarean section [O82] Intrauterine pregnancy: [redacted]w[redacted]d     Secondary diagnosis:  Principal Problem:   Delivery of pregnancy by cesarean section Active Problems:   Seizure disorder in pregnancy, antepartum (HCC)   Obesity (BMI 35.0-39.9 without comorbidity)   Low-lying placenta  Additional problems: none    Discharge diagnosis: Term Pregnancy Delivered                                              Post partum procedures: IV Venofer given on POD#1 Augmentation: N/A Complications: None  Hospital course: Scheduled C/S   30 y.o. yo [id number] at [redacted]w[redacted]d was admitted to the hospital 12/23/2021 for scheduled cesarean section with the following indication:Previa.Delivery details are as follows:  Membrane Rupture Time/Date: 1:26 PM ,12/23/2021   Delivery Method:C-Section, Low Transverse  Details of operation can be found in separate operative note.  Patient had an uncomplicated postpartum course with the addition of a Venofer infusion on POD#1 due to Hgb of 7.9.  She is ambulating, tolerating a regular diet, passing flatus, and urinating well. Patient is discharged home in stable condition on  12/25/21        Newborn Data: Birth date:12/23/2021  Birth time:1:27 PM  Gender:Female  Living status:Living  Apgars:9 ,9  Weight:2730 g (6lb 0.3oz)    Magnesium Sulfate received: No BMZ received: No Rhophylac:No MMR:No T-DaP:Given prenatally Flu: No Transfusion:No  Physical exam  Vitals:   12/24/21 0630 12/24/21 1335 12/24/21 2200 12/25/21 0614  BP: (!) 88/58  (!) 96/53 (!) 92/50  Pulse: (!) 52  (!) 58 (!) 48  Resp: 16  16 18  Temp: 98.1 F (36.7 C) 97.8 F (36.6 C) 97.8 F (36.6 C) 98.1 F (36.7 C)  TempSrc: Oral Oral Oral Oral   SpO2: 99%  100% 100%  Weight:      Height:       General: alert and cooperative Lochia: appropriate Uterine Fundus: firm Incision: honeycomb dry and intact DVT Evaluation: No evidence of DVT seen on physical exam. Labs: Lab Results  Component Value Date   WBC 8.0 12/24/2021   HGB 7.9 (L) 12/24/2021   HCT 24.1 (L) 12/24/2021   MCV 94.5 12/24/2021   PLT 152 12/24/2021      Latest Ref Rng & Units 12/23/2021    4:38 PM  CMP  Creatinine 0.44 - 1.00 mg/dL 0.61     Edinburgh Score:    12/23/2021    8:41 PM  Edinburgh Postnatal Depression Scale Screening Tool  I have been able to laugh and see the funny side of things. 0  I have looked forward with enjoyment to things. 0  I have blamed myself unnecessarily when things went wrong. 0  I have been anxious or worried for no good reason. 0  I have felt scared or panicky for no good reason. 0  Things have been getting on top of me. 0  I have been so unhappy that I have had difficulty sleeping. 0  I have felt sad or miserable. 0  I have been so unhappy that I have been crying. 0  The thought of harming myself has

## 2021-12-23 NOTE — Transfer of Care (Signed)
Immediate Anesthesia Transfer of Care Note  Patient: Brittany Hendrix  Procedure(s) Performed: CESAREAN SECTION  Patient Location: PACU  Anesthesia Type:Spinal  Level of Consciousness: awake, alert  and oriented  Airway & Oxygen Therapy: Patient Spontanous Breathing  Post-op Assessment: Report given to RN and Post -op Vital signs reviewed and stable  Post vital signs: Reviewed and stable  Last Vitals:  Vitals Value Taken Time  BP    Temp 37 C 12/23/21 1411  Pulse 65 12/23/21 1414  Resp 15 12/23/21 1414  SpO2 100 % 12/23/21 1414  Vitals shown include unvalidated device data.  Last Pain:  Vitals:   12/23/21 1411  TempSrc: Oral  PainSc: 0-No pain         Complications: No notable events documented.

## 2021-12-23 NOTE — Op Note (Addendum)
Brittany Hendrix PROCEDURE DATE: 12/23/2021  PREOPERATIVE DIAGNOSIS: Intrauterine pregnancy at  [redacted]w[redacted]d weeks gestation;  previous uterine incision, posterior placenta previa  POSTOPERATIVE DIAGNOSIS: The same  PROCEDURE: Repeat Low Transverse Cesarean Section  SURGEON:  Dr. Myna Hidalgo   ASSISTANT: Dr. Leticia Penna   An experienced assistant was required given the standard of surgical care given the complexity of the case.  This assistant was needed for exposure, dissection, suctioning, retraction, instrument exchange, assisting with delivery with administration of fundal pressure, and for overall help during the procedure.   INDICATIONS: Brittany Hendrix is a 31 y.o. [phone] at [redacted]w[redacted]d scheduled for cesarean section secondary to placenta previa and previous uterine incision .  The risks of cesarean section discussed with the patient included but were not limited to: bleeding which may require transfusion or reoperation; infection which may require antibiotics; injury to bowel, bladder, ureters or other surrounding organs; injury to the fetus; need for additional procedures including hysterectomy in the event of a life-threatening hemorrhage; placental abnormalities wth subsequent pregnancies, incisional problems, thromboembolic phenomenon and other postoperative/anesthesia complications. The patient concurred with the proposed plan, giving informed written consent for the procedure.    FINDINGS:  Viable female infant in cephalic presentation.  Apgars 9 and 9, weight 2730g.  Clear amniotic fluid.  Intact placenta, three vessel cord.  Normal uterus, fallopian tubes and ovaries bilaterally.  ANESTHESIA:  Spinal INTRAVENOUS FLUIDS: 1,900 ml ESTIMATED BLOOD LOSS: 284 ml URINE OUTPUT: 110 ml SPECIMENS: Placenta sent to L&D COMPLICATIONS: None immediate  PROCEDURE IN DETAIL:  The patient received intravenous antibiotics and had sequential compression devices applied to her lower extremities while  in the preoperative area.  She was then taken to the operating room where spinal anesthesia was administered and was found to be adequate. She was then placed in a dorsal supine position with a leftward tilt, and prepped and draped in a sterile manner.  A foley catheter was placed into her bladder and attached to constant gravity, which drained clear fluid throughout.  After an adequate timeout was performed, a Pfannenstiel skin incision was made with scalpel and carried through to the underlying layer of fascia with the Bovie tool. The fascia was incised in the midline with the scapel and this incision was extended bilaterally using the Mayo scissors. Kocher clamps were applied to the superior aspect of the fascial incision and the underlying rectus muscles were dissected off bluntly/sharply. A similar process was carried out on the inferior aspect of the facial incision. The rectus muscles were separated in the midline bluntly and the peritoneum was entered bluntly. An Alexis retractor was placed to aid in visualization of the uterus.  Attention was turned to the lower uterine segment where a transverse hysterotomy was made with a scalpel and extended bilaterally bluntly. The infant was successfully delivered, and cord was clamped and cut and infant was handed over to awaiting neonatology team.   Uterine massage was then administered and the placenta delivered intact with three-vessel cord. The uterus was then cleared of clot and debris.  The hysterotomy was closed with 0 Vicryl in a single layer with running locked fashion. A few figure of eight stitches were used for hemostasis. Arista was placed. Overall, excellent hemostasis was noted. The abdomen and the pelvis were cleared of all clot and debris and the Jon Gills was removed. Hemostasis was confirmed on all surfaces.  Arista was applied.  The peritoneum was reapproximated using 2-0 vicryl running stitches. The fascia was then closed using  0 Vicryl in a  running fashion. The subcutaneous layer was reapproximated with plain gut and the skin was closed with 4-0 vicryl. The patient tolerated the procedure well. Sponge, lap, instrument and needle counts were correct x 2. She was taken to the recovery room in stable condition.    Allayne Stack, DO 12/23/2021 1:52 PM

## 2021-12-24 LAB — CBC
HCT: 24.1 % — ABNORMAL LOW (ref 36.0–46.0)
Hemoglobin: 7.9 g/dL — ABNORMAL LOW (ref 12.0–15.0)
MCH: 31 pg (ref 26.0–34.0)
MCHC: 32.8 g/dL (ref 30.0–36.0)
MCV: 94.5 fL (ref 80.0–100.0)
Platelets: 152 10*3/uL (ref 150–400)
RBC: 2.55 MIL/uL — ABNORMAL LOW (ref 3.87–5.11)
RDW: 13.2 % (ref 11.5–15.5)
WBC: 8 10*3/uL (ref 4.0–10.5)
nRBC: 0 % (ref 0.0–0.2)

## 2021-12-24 MED ORDER — ALBUTEROL SULFATE (2.5 MG/3ML) 0.083% IN NEBU: 2.5000 mg | INHALATION_SOLUTION | Freq: Once | RESPIRATORY_TRACT | Status: DC | PRN

## 2021-12-24 MED ORDER — METHYLPREDNISOLONE SODIUM SUCC 125 MG IJ SOLR: 125.0000 mg | Freq: Once | INTRAMUSCULAR | Status: DC | PRN

## 2021-12-24 MED ORDER — EPINEPHRINE PF 1 MG/ML IJ SOLN: 0.3000 mg | Freq: Once | INTRAMUSCULAR | Status: DC | PRN

## 2021-12-24 MED ORDER — DIPHENHYDRAMINE HCL 50 MG/ML IJ SOLN: 25.0000 mg | Freq: Once | INTRAMUSCULAR | Status: DC | PRN

## 2021-12-24 MED ORDER — SODIUM CHLORIDE 0.9 % IV SOLN: INTRAVENOUS | Status: DC | PRN

## 2021-12-24 MED ORDER — SODIUM CHLORIDE 0.9 % IV BOLUS: 500.0000 mL | Freq: Once | INTRAVENOUS | Status: DC | PRN

## 2021-12-24 MED ORDER — SODIUM CHLORIDE 0.9 % IV SOLN
500.0000 mg | Freq: Once | INTRAVENOUS | Status: AC
  Administered 2021-12-24: 500 mg via INTRAVENOUS
  Filled 2021-12-24: qty 25

## 2021-12-24 MED FILL — Heparin Sodium (Porcine) Inj 1000 Unit/ML: INTRAMUSCULAR | Qty: 30 | Status: AC

## 2021-12-24 MED FILL — Sodium Chloride IV Soln 0.9%: INTRAVENOUS | Qty: 1000 | Status: AC

## 2021-12-24 NOTE — Progress Notes (Signed)
Circumcision Consent  Discussed with mom at bedside about circumcision.   Circumcision is a surgery that removes the skin that covers the tip of the penis, called the "foreskin." Circumcision is usually done when a boy is between 1 and 10 days old, sometimes up to 3-4 weeks old.  The most common reasons boys are circumcised include for cultural/religious beliefs or for parental preference (potentially easier to clean, so baby looks like daddy, etc).  There may be some medical benefits for circumcision:   Circumcised boys seem to have slightly lower rates of: ? Urinary tract infections (per the American Academy of Pediatrics an uncircumcised boy has a 1/100 chance of developing a UTI in the first year of life, a circumcised boy at a 07/998 chance of developing a UTI in the first year of life- a 10% reduction) ? Penis cancer (typically rare- an uncircumcised female has a 1 in 100,000 chance of developing cancer of the penis) ? Sexually transmitted infection (in endemic areas, including HIV, HPV and Herpes- circumcision does NOT protect against gonorrhea, chlamydia, trachomatis, or syphilis) ? Phimosis: a condition where that makes retraction of the foreskin over the glans impossible (0.4 per 1000 boys per year or 0.6% of boys are affected by their 15th birthday)  Boys and men who are not circumcised can reduce these extra risks by: ? Cleaning their penis well ? Using condoms during sex  What are the risks of circumcision?  As with any surgical procedure, there are risks and complications. In circumcision, complications are rare and usually minor, the most common being: ? Bleeding- risk is reduced by holding each clamp for 30 seconds prior to a cut being made, and by holding pressure after the procedure is done ? Infection- the penis is cleaned prior to the procedure, and the procedure is done under sterile technique ? Damage to the urethra or amputation of the penis  How is circumcision done  in baby boys?  The baby will be placed on a special table and the legs restrained for their safety. Numbing medication is injected into the penis, and the skin is cleansed with betadine to decrease the risk of infection.   What to expect:  The penis will look red and raw for 5-7 days as it heals. We expect scabbing around where the cut was made, as well as clear-pink fluid and some swelling of the penis right after the procedure. If your baby's circumcision starts to bleed or develops pus, please contact your pediatrician immediately.  All questions were answered and mother consented.  Alice Vitelli Cresenzo-Dishmon Obstetrics Fellow  

## 2021-12-24 NOTE — Progress Notes (Signed)
Post-Op Day 1, repeat CS for placenta previa  Subjective: No complaints, up ad lib, voiding and tolerating PO, passing flatus,small lochia,.plans to breastfeed, plans to bottle feed, oral progesterone-only contraceptive  Objective: Blood pressure (!) 88/58, pulse (!) 52, temperature 98.1 F (36.7 C), temperature source Oral, resp. rate 16, height 5\' 4"  (1.626 m), weight 99.8 kg, last menstrual period 04/08/2021, SpO2 99 %, unknown if currently breastfeeding.  Physical Exam:  General: alert, cooperative and no distress Lochia:normal flow Chest: CTAB Heart: RRR no m/r/g Abdomen: +BS, soft, nontender, dsg c/d/intact, no erythema Uterine Fundus: firm DVT Evaluation: No evidence of DVT seen on physical exam. Extremities: no edema  Recent Labs    12/21/21 0944 12/24/21 0500  HGB 10.7* 7.9*  HCT 31.9* 24.1*    Assessment/Plan: POD#1, stable Anemia 2/2 blood loss/pregnancy>IV Venofer today   LOS: 1 day   02/23/22 12/24/2021, 7:35 AM

## 2021-12-24 NOTE — Lactation Note (Signed)
This note was copied from a baby's chart. Lactation Consultation Note  Patient Name: Boy Callista Hoh HYQMV'[id number] Date: 12/24/2021   Age:31 hours   LC Note:  Per RN, mother is formula feeding only.   Maternal Data    Feeding Mother's Current Feeding Choice: Formula Nipple Type: Slow - flow  LATCH Score                    Lactation Tools Discussed/Used    Interventions    Discharge    Consult Status Consult Status: Complete    Teal Bontrager R Damyia Strider 12/24/2021, 8:49 AM

## 2021-12-25 ENCOUNTER — Other Ambulatory Visit: Payer: Self-pay

## 2021-12-25 ENCOUNTER — Encounter (HOSPITAL_COMMUNITY): Payer: Self-pay | Admitting: Obstetrics & Gynecology

## 2021-12-25 MED ORDER — FERROUS SULFATE 324 (65 FE) MG PO TBEC
1.0000 | DELAYED_RELEASE_TABLET | ORAL | 3 refills | Status: DC
Start: 2021-12-25 — End: 2022-06-15

## 2021-12-25 MED ORDER — OXYCODONE HCL 5 MG PO TABS: 5.0000 mg | ORAL_TABLET | ORAL | 0 refills | Status: DC | PRN

## 2021-12-25 MED ORDER — IBUPROFEN 600 MG PO TABS: 600.0000 mg | ORAL_TABLET | Freq: Four times a day (QID) | ORAL | 0 refills | Status: DC | PRN

## 2021-12-26 NOTE — Discharge Summary (Signed)
Postpartum Discharge Summary     Patient Name: Brittany Hendrix DOB: [birth year] MRN: [id number]  Date of admission: 12/23/2021 Delivery date:12/23/2021  Delivering provider: Janyth Pupa  Date of discharge: 12/26/2021  Admitting diagnosis: Delivery of pregnancy by cesarean section [O82] Intrauterine pregnancy: [redacted]w[redacted]d    Secondary diagnosis:  Principal Problem:   Delivery of pregnancy by cesarean section Active Problems:   Seizure disorder in pregnancy, [place]   Obesity (BMI 35.0-39.9 without comorbidity)   Supervision of high risk pregnancy, antepartum   Previous cesarean delivery affecting pregnancy   Low-lying placenta  Additional problems: None    Discharge diagnosis: Term Pregnancy Delivered                                              Post partum procedures: IV Venofer given on POD#1 Augmentation: N/A Complications: None  Hospital course: Scheduled C/S - 31y.o. yo [id number] admitted to the hospital 12/23/2021 for scheduled cesarean section with the following indication: Previa. Delivery details are as follows:  Membrane Rupture Time/Date: 1:26 PM ,12/23/2021   Delivery Method:C-Section, Low Transverse  Details of operation can be found in separate operative note.  Patient had an uncomplicated postpartum course with the addition of a Venofer infusion on POD#1 due to Hgb of 7.9.  She is ambulating, tolerating a regular diet, passing flatus, and urinating well.  Her pain and bleeding are controlled.  She is formula feeding well.  Patient is discharged home in stable condition on  12/26/21        Newborn Data: Birth date:12/23/2021  Birth time:1:27 PM  Gender:Female  Living status:Living  Apgars:9 ,9  Weight:2730 g (6lb 0.3oz)    Magnesium Sulfate received: No BMZ received: No Rhophylac: N/A MMR: N/A T-DaP: Given prenatally Flu: No Transfusion: No  Physical exam  Vitals:   12/25/21 0614 12/25/21 1443 12/25/21 2130 12/26/21 0631  BP: (!) 92/50 (!)  91/50 (!) 91/46 (!) 96/50  Pulse: (!) 48 (!) 54 (!) 50 (!) 47  Resp: '18 18 18 16  ' Temp: 98.1 F (36.7 C) 97.9 F (36.6 C) (!) 97.4 F (36.3 C) 98.1 F (36.7 C)  TempSrc: Oral Axillary Axillary Oral  SpO2: 100%     Weight:      Height:       General: alert and cooperative, resting comfortably  Lochia: appropriate Uterine Fundus: firm and below umbilicus  Incision: healing well, no significant drainage, dressing is clean, dry, and intact  DVT Evaluation: no LE edema or calf tenderness to palpation   Labs: Lab Results  Component Value Date   WBC 8.0 12/24/2021   HGB 7.9 (L) 12/24/2021   HCT 24.1 (L) 12/24/2021   MCV 94.5 12/24/2021   PLT 152 12/24/2021      Latest Ref Rng & Units 12/23/2021    4:38 PM  CMP  Creatinine 0.44 - 1.00 mg/dL 0.61     Edinburgh Score:    12/23/2021    8:41 PM  Edinburgh Postnatal Depression Scale Screening Tool  I have been able to laugh and see the funny side of things. 0  I have looked forward with enjoyment to things. 0  I have blamed myself unnecessarily when things went wrong. 0  I have been anxious or worried for no good reason. 0  I have felt scared or panicky for no  good reason. 0  Things have been getting on top of me. 0  I have been so unhappy that I have had difficulty sleeping. 0  I have felt sad or miserable. 0  I have been so unhappy that I have been crying. 0  The thought of harming myself has occurred to me. 0  Edinburgh Postnatal Depression Scale Total 0     After visit meds:  Allergies as of 12/26/2021   No Known Allergies      Medication List     TAKE these medications    ferrous sulfate 324 (65 Fe) MG Tbec Take 1 tablet (325 mg total) by mouth every other day.   ibuprofen 600 MG tablet Commonly known as: ADVIL Take 1 tablet (600 mg total) by mouth every 6 (six) hours as needed.   levETIRAcetam 1000 MG tablet Commonly known as: Keppra Take 1 tablet (1,000 mg total) by mouth 2 (two) times daily.   oxyCODONE  5 MG immediate release tablet Commonly known as: Oxy IR/ROXICODONE Take 1-2 tablets (5-10 mg total) by mouth every 4 (four) hours as needed for moderate pain.               Discharge Care Instructions  (From admission, onward)           Start     Ordered   12/26/21 0000  Discharge wound care:       Comments: Remove dressing 5 days after your surgery date. You can then wash the area gently with soap and water when in the shower and pat dry. You will have an incision check in about 1 week.   12/26/21 0737             Discharge home in stable condition Infant Feeding: Bottle Infant Disposition: home with mother Discharge instruction: per After Visit Summary and Postpartum booklet. Activity: Advance as tolerated. Pelvic rest for 6 weeks.  Diet: routine diet Future Appointments: Future Appointments  Date Time Provider [place]  12/30/2021  1:20 PM [place] None  02/03/2022  1:10 PM Griffin Basil, MD [place] None  06/15/2022  9:45 AM Suzzanne Cloud, NP [facility] None   Follow up Visit: Message sent to [place] by Dr Higinio Plan:  Please schedule this patient for a In person postpartum visit in 6 weeks with the following provider: Any provider. Additional Postpartum F/U: Incision check 1 week  High risk pregnancy complicated by:  Placenta previa, previous CS  Delivery mode:  C-Section, Low Transverse  Anticipated Birth Control:  OCPs - plans to start at postpartum visit   12/26/2021 Genia Del, MD 7:38 AM

## 2021-12-30 ENCOUNTER — Ambulatory Visit (INDEPENDENT_AMBULATORY_CARE_PROVIDER_SITE_OTHER): Payer: Medicaid Other

## 2021-12-30 VITALS — BP 119/65 | HR 80 | Wt 216.0 lb

## 2021-12-30 DIAGNOSIS — Z4889 Encounter for other specified surgical aftercare: Secondary | ICD-10-CM

## 2021-12-30 NOTE — Progress Notes (Signed)
Subjective:     Brittany Hendrix is a 31 y.o. female who presents to the clinic 1 week status post  Low Transverse C-Section  Eating a regular diet without difficulty. Bowel movements are  constipation .  Pain 4/10  The following portions of the patient's history were reviewed and updated as appropriate: allergies, current medications, past family history, past medical history, past social history, past surgical history, and problem list.    Objective:    BP 119/65   Wt 216 lb (98 kg)   LMP 04/08/2021 Comment: SAB  Breastfeeding Yes   BMI 37.08 kg/m  General:  alert  Abdomen: soft, non-tender  Incision:  healing well, no drainage, no erythema, no hernia, no seroma, no swelling, no dehiscence, incision well approximated     Assessment:    Doing well postoperatively.   Plan:   1. Continue any current medications. 2. Wound care discussed. 3. Activity restrictions: Pelvic rest x 6 weeks  4. Anticipated return to work: unemployed per pt 5. Follow up: 1  month for routine postpartum visit and birth control pill rx   Dalphine Handing, CMA

## 2022-02-03 ENCOUNTER — Ambulatory Visit (INDEPENDENT_AMBULATORY_CARE_PROVIDER_SITE_OTHER): Payer: Medicaid Other | Admitting: Obstetrics and Gynecology

## 2022-02-03 DIAGNOSIS — Z30011 Encounter for initial prescription of contraceptive pills: Secondary | ICD-10-CM | POA: Diagnosis not present

## 2022-02-03 MED ORDER — NORETHIN ACE-ETH ESTRAD-FE 1-20 MG-MCG(24) PO TABS: 1.0000 | ORAL_TABLET | Freq: Every day | ORAL | 11 refills | Status: DC

## 2022-02-03 NOTE — Progress Notes (Signed)
Post Partum Visit Note  Brittany Hendrix is a 31 y.o. [phone] female who presents for a postpartum visit. She is 6 weeks postpartum following a repeat cesarean section.  I have fully reviewed the prenatal and intrapartum course. The delivery was at 37 gestational weeks.  Anesthesia: spinal. Postpartum course has been uncomplicated. Baby is doing well. Baby is feeding by bottle - Gerber Gentle . Bleeding red, menses. Bowel function is normal. Bladder function is normal. Patient is not sexually active. Contraception method is  would like OCPs . Postpartum depression screening: negative.   The pregnancy intention screening data noted above was reviewed. Potential methods of contraception were discussed. The patient elected to proceed with No data recorded.   Edinburgh Postnatal Depression Scale - 02/03/22 1340       Edinburgh Postnatal Depression Scale:  In the Past 7 Days   I have been able to laugh and see the funny side of things. 0    I have looked forward with enjoyment to things. 0    I have blamed myself unnecessarily when things went wrong. 0    I have been anxious or worried for no good reason. 0    I have felt scared or panicky for no good reason. 0    Things have been getting on top of me. 0    I have been so unhappy that I have had difficulty sleeping. 0    I have felt sad or miserable. 0    I have been so unhappy that I have been crying. 0    The thought of harming myself has occurred to me. 0    Edinburgh Postnatal Depression Scale Total 0             Health Maintenance Due  Topic Date Due   COVID-19 Vaccine (1) Never done    The following portions of the patient's history were reviewed and updated as appropriate: allergies, current medications, past family history, past medical history, past social history, past surgical history, and problem list.  Review of Systems Pertinent items are noted in HPI.  Objective:  BP 111/77   Pulse 71   Ht 5\' 4"  (1.626 m)    Wt 224 lb (101.6 kg)   LMP 02/02/2022 Comment: SAB  Breastfeeding No   BMI 38.45 kg/m    General:  alert, cooperative, no distress, and mildly obese   Breasts:  not indicated  Lungs: clear to auscultation bilaterally  Heart:  regular rate and rhythm  Abdomen: soft, non-tender; bowel sounds normal; no masses,  no organomegaly   Wound well approximated incision  GU exam:  not indicated       Assessment:     normal postpartum exam.   Plan:   Essential components of care per ACOG recommendations:  1.  Mood and well being: Patient with negative depression screening today. Reviewed local resources for support.  - Patient tobacco use? Yes. Patient desires to quit? No.   - hx of drug use? No.    2. Infant care and feeding:  -Patient currently breastmilk feeding? No.  -Social determinants of health (SDOH) reviewed in EPIC. The following needs were identified: continued smoking  3. Sexuality, contraception and birth spacing - Patient does not want a pregnancy in the next year.  Desired family size is 4 children.  - Reviewed reproductive life planning. Reviewed contraceptive methods based on pt preferences and effectiveness.  Patient desired Oral Contraceptive today.   - Discussed birth spacing of  18 months  4. Sleep and fatigue -Encouraged family/partner/community support of 4 hrs of uninterrupted sleep to help with mood and fatigue  5. Physical Recovery  - Discussed patients delivery and complications. She describes her labor as good. - Patient had a C-section repeat; no problems after deliver. Patient expressed understanding - Patient has urinary incontinence? No. - Patient is safe to resume physical and sexual activity  6.  Health Maintenance - HM due items addressed Yes - Last pap smear  Diagnosis  Date Value Ref Range Status  11/07/2019   Final   - Negative for intraepithelial lesion or malignancy (NILM)   Pap smear not done at today's visit.  -Breast Cancer screening  indicated? No.   7. Chronic Disease/Pregnancy Condition follow up: None  - PCP follow up Work and physical therapy note added  Warden Fillers, MD [facility] [employer], [facility]

## 2022-05-23 ENCOUNTER — Emergency Department (HOSPITAL_BASED_OUTPATIENT_CLINIC_OR_DEPARTMENT_OTHER)
Admission: EM | Admit: 2022-05-23 | Discharge: 2022-05-23 | Discharge status: Against Medical Advice | Payer: Medicaid Other | Source: Home / Self Care

## 2022-05-23 ENCOUNTER — Encounter (HOSPITAL_BASED_OUTPATIENT_CLINIC_OR_DEPARTMENT_OTHER): Payer: Self-pay | Admitting: Emergency Medicine

## 2022-05-23 ENCOUNTER — Other Ambulatory Visit: Payer: Self-pay

## 2022-05-23 ENCOUNTER — Emergency Department (HOSPITAL_COMMUNITY)
Admission: EM | Admit: 2022-05-23 | Discharge: 2022-05-23 | Discharge status: Against Medical Advice | Payer: Medicaid Other | Attending: Emergency Medicine | Admitting: Emergency Medicine

## 2022-05-23 DIAGNOSIS — L299 Pruritus, unspecified: Secondary | ICD-10-CM | POA: Diagnosis present

## 2022-05-23 DIAGNOSIS — K649 Unspecified hemorrhoids: Secondary | ICD-10-CM | POA: Insufficient documentation

## 2022-05-23 DIAGNOSIS — K640 First degree hemorrhoids: Secondary | ICD-10-CM | POA: Insufficient documentation

## 2022-05-23 DIAGNOSIS — Z5321 Procedure and treatment not carried out due to patient leaving prior to being seen by health care provider: Secondary | ICD-10-CM | POA: Insufficient documentation

## 2022-05-23 LAB — COMPREHENSIVE METABOLIC PANEL
ALT: 13 U/L (ref 0–44)
AST: 13 U/L — ABNORMAL LOW (ref 15–41)
Albumin: 3.7 g/dL (ref 3.5–5.0)
Alkaline Phosphatase: 68 U/L (ref 38–126)
Anion gap: 8 (ref 5–15)
BUN: 10 mg/dL (ref 6–20)
CO2: 22 mmol/L (ref 22–32)
Calcium: 8.9 mg/dL (ref 8.9–10.3)
Chloride: 110 mmol/L (ref 98–111)
Creatinine, Ser: 0.67 mg/dL (ref 0.44–1.00)
GFR, Estimated: >60 mL/min (ref >60)
Glucose, Bld: 100 mg/dL — ABNORMAL HIGH (ref 70–99)
Potassium: 4.2 mmol/L (ref 3.5–5.1)
Sodium: 140 mmol/L (ref 135–145)
Total Bilirubin: 0.5 mg/dL (ref 0.3–1.2)
Total Protein: 6.7 g/dL (ref 6.5–8.1)

## 2022-05-23 LAB — I-STAT BETA HCG BLOOD, ED (MC, WL, AP ONLY): I-stat hCG, quantitative: <5 m[IU]/mL (ref <5)

## 2022-05-23 LAB — CBC
HCT: 39.1 % (ref 36.0–46.0)
Hemoglobin: 12.7 g/dL (ref 12.0–15.0)
MCH: 31 pg (ref 26.0–34.0)
MCHC: 32.5 g/dL (ref 30.0–36.0)
MCV: 95.4 fL (ref 80.0–100.0)
Platelets: 245 10*3/uL (ref 150–400)
RBC: 4.1 MIL/uL (ref 3.87–5.11)
RDW: 12.2 % (ref 11.5–15.5)
WBC: 7.3 10*3/uL (ref 4.0–10.5)
nRBC: 0 % (ref 0.0–0.2)

## 2022-05-23 LAB — URINALYSIS, ROUTINE W REFLEX MICROSCOPIC
Bilirubin Urine: NEGATIVE
Glucose, UA: NEGATIVE mg/dL
Hgb urine dipstick: NEGATIVE
Ketones, ur: NEGATIVE mg/dL
Leukocytes,Ua: NEGATIVE
Nitrite: NEGATIVE
Protein, ur: NEGATIVE mg/dL
Specific Gravity, Urine: 1.023 (ref 1.005–1.030)
pH: 5 (ref 5.0–8.0)

## 2022-05-23 MED ORDER — OXYCODONE-ACETAMINOPHEN 5-325 MG PO TABS
1.0000 | ORAL_TABLET | Freq: Once | ORAL | Status: AC
  Administered 2022-05-23: 1 via ORAL
  Filled 2022-05-23: qty 1

## 2022-05-23 NOTE — ED Notes (Signed)
Pt stated that she was going to leave here and go to an urgent care due to the long wait time and she was in extreme pain. Moving pt OTF.

## 2022-05-23 NOTE — ED Triage Notes (Addendum)
C/o hemorrhoid pain x 1 week. Was seen at [facility] ED but left due to wait time. She had an oxycodone at triage with some relief.

## 2022-05-23 NOTE — ED Triage Notes (Signed)
Pt. Stated while kneeling in the floor Ive got Hemorids and Ive got blood in clots from my rectum, itching and the pain is so bad I can not hardly wipe myself.

## 2022-05-24 ENCOUNTER — Ambulatory Visit (INDEPENDENT_AMBULATORY_CARE_PROVIDER_SITE_OTHER): Payer: Medicaid Other | Admitting: Obstetrics

## 2022-05-24 ENCOUNTER — Encounter: Payer: Self-pay | Admitting: Obstetrics

## 2022-05-24 VITALS — BP 109/66 | HR 67 | Ht 64.0 in | Wt 232.0 lb

## 2022-05-24 DIAGNOSIS — K649 Unspecified hemorrhoids: Secondary | ICD-10-CM

## 2022-05-24 DIAGNOSIS — E669 Obesity, unspecified: Secondary | ICD-10-CM | POA: Diagnosis not present

## 2022-05-24 MED ORDER — OXYCODONE-ACETAMINOPHEN 5-325 MG PO TABS: 1.0000 | ORAL_TABLET | ORAL | 0 refills | Status: DC | PRN

## 2022-05-24 MED ORDER — NEOSPORIN PLUS PAIN RELIEF MS 3.5-10000-10 EX CREA: TOPICAL_CREAM | Freq: Two times a day (BID) | CUTANEOUS | 5 refills | Status: DC

## 2022-05-24 MED ORDER — HYDROCORT-PRAMOXINE (PERIANAL) 1-1 % EX FOAM: 1.0000 | Freq: Two times a day (BID) | CUTANEOUS | 11 refills | Status: DC

## 2022-05-24 NOTE — Progress Notes (Addendum)
12 y,o GYN presents for hemorrhoids, Pt thinks it is ruptured and has been bleeding for a week.  C/o pain 8/10 x 7 days.  Last PAP 11/07/2019

## 2022-05-24 NOTE — Progress Notes (Signed)
Patient ID: Brittany Hendrix, female   DOB: 06/25/1991, 31 y.o.   MRN: [id number]  No chief complaint on file.   HPI Brittany Hendrix is a 31 y.o. female.  Complains of painful hemorrhoids.  Has difficulty sitting.  Went to ER yesterday, but has little relief.  Also has constipation, mild. 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 [ ]  Inform patient Per up-to-date "considered to be a normal variant. Patients are asymptomatic, have no compromise of fertility, and similar pregnancy outcomes as those in the general obstetric population. "    Headache(784.0)    Seizures (HCC)    Last seizure May 25 2020    Past Surgical History:  Procedure Laterality Date   CESAREAN SECTION N/A 05/20/2020   Procedure: CESAREAN SECTION;  Surgeon: 05/22/2020, MD;  Location: [facility] ORS;  Service: Obstetrics;  Laterality: N/A;   CESAREAN SECTION N/A 12/23/2021   Procedure: CESAREAN SECTION;  Surgeon: 02/22/2022, MD;  Location: [facility] ORS;  Service: Obstetrics;  Laterality: N/A;   DILATION AND CURETTAGE OF UTERUS     abortion   DILATION AND EVACUATION N/A 10/04/2017   Procedure: DILATATION AND EVACUATION;  Surgeon: 10/06/2017, MD;  Location: [facility] ORS;  Service: Gynecology;  Laterality: N/A;   DILATION AND EVACUATION N/A 12/23/2020   Procedure: DILATATION AND EVACUATION;  Surgeon: 02/22/2021, MD;  Location: [facility] OR;  Service: Gynecology;  Laterality: N/A;   OPERATIVE ULTRASOUND N/A 12/23/2020   Procedure: OPERATIVE ULTRASOUND;  Surgeon: 02/22/2021, MD;  Location: [facility] OR;  Service: Gynecology;  Laterality: N/A;    Family History  Problem Relation Age of Onset   Diabetes Mother    Asthma Brother     Social History Social History   Tobacco Use   Smoking status: Every Day    Packs/day: 0.50    Years: 5.00    Total pack years: 2.50    Types: Cigarettes    Passive exposure: Past   Smokeless tobacco:  Current  Vaping Use   Vaping Use: Never used  Substance Use Topics   Alcohol use: Yes    Comment: occasional, not while preg   Drug use: Yes    Types: Marijuana    Comment: last used end of march 2023    No Known Allergies  Current Outpatient Medications  Medication Sig Dispense Refill   hydrocortisone-pramoxine (PROCTOFOAM-HC) rectal foam Place 1 applicator rectally 2 (two) times daily. 10 g 11   ibuprofen (ADVIL) 600 MG tablet Take 1 tablet (600 mg total) by mouth every 6 (six) hours as needed. 30 tablet 0   levETIRAcetam (KEPPRA) 1000 MG tablet Take 1 tablet (1,000 mg total) by mouth 2 (two) times daily. 180 tablet 4   neomycin-polymyxin-pramoxine (NEOSPORIN PLUS) 1 % cream Apply topically 2 (two) times daily. 14.2 g 5   oxyCODONE-acetaminophen (PERCOCET/ROXICET) 5-325 MG tablet Take 1 tablet by mouth every 4 (four) hours as needed for severe pain. 20 tablet 0   ferrous sulfate 324 (65 Fe) MG TBEC Take 1 tablet (325 mg total) by mouth every other day. (Patient not taking: Reported on 12/30/2021) 30 tablet 3   Norethindrone Acetate-Ethinyl Estrad-FE (LOESTRIN 24 FE) 1-20 MG-MCG(24) tablet Take 1 tablet by mouth daily. 28 tablet 11   oxyCODONE (OXY IR/ROXICODONE) 5 MG immediate release tablet Take 1-2 tablets (5-10 mg total) by mouth every 4 (four) hours as  needed for moderate pain. (Patient not taking: Reported on 02/03/2022) 30 tablet 0   No current facility-administered medications for this visit.    Review of Systems Review of Systems Constitutional: negative for fatigue and weight loss Respiratory: negative for cough and wheezing Cardiovascular: negative for chest pain, fatigue and palpitations Gastrointestinal: positive for painful hemorrhoids and constipationnegative for abdominal pain and change in bowel habit Genitourinary:negative Integument/breast: negative for nipple discharge Musculoskeletal:negative for myalgias Neurological: negative for gait problems and  tremors Behavioral/Psych: negative for abusive relationship, depression Endocrine: negative for temperature intolerance      Blood pressure 109/66, pulse 67, height 5\' 4"  (1.626 m), weight 232 lb (105.2 kg), last menstrual period 04/29/2022, not currently breastfeeding.  Physical Exam Physical Exam General:   Alert and moderate distress  Skin:   no rash or abnormalities  Lungs:   clear to auscultation bilaterally  Heart:   regular rate and rhythm, S1, S2 normal, no murmur, click, rub or gallop  Anus:  Very tender anal ring.  Could not introduce finger.  Appears to be superficial lacerations extending out from the anus.   I have spent a total of 20 minutes of face-to-face time, excluding clinical staff time, reviewing notes and preparing to see patient, ordering tests and/or medications, and counseling the patient.   Data Reviewed Labs  Assessment     1. Hemorrhoids, unspecified hemorrhoid type Rx: - Ambulatory referral to Gastroenterology - hydrocortisone-pramoxine [facility]) rectal foam; Place 1 applicator rectally 2 (two) times daily.  Dispense: 10 g; Refill: 11 - neomycin-polymyxin-pramoxine (NEOSPORIN PLUS) 1 % cream; Apply topically 2 (two) times daily.  Dispense: 14.2 g; Refill: 5 - oxyCODONE-acetaminophen (PERCOCET/ROXICET) 5-325 MG tablet; Take 1 tablet by mouth every 4 (four) hours as needed for severe pain.  Dispense: 20 tablet; Refill: 0  2. Obesity (BMI 35.0-39.9 without comorbidity)      Plan   Follow up prn  Orders Placed This Encounter  Procedures   Ambulatory referral to Gastroenterology    Referral Priority:   Routine    Referral Type:   Consultation    Referral Reason:   Specialty Services Required    Referred to Provider:   Yetta Flock, MD    Number of Visits Requested:   1   Meds ordered this encounter  Medications   hydrocortisone-pramoxine (PROCTOFOAM-HC) rectal foam    Sig: Place 1 applicator rectally 2 (two) times daily.     Dispense:  10 g    Refill:  11   neomycin-polymyxin-pramoxine (NEOSPORIN PLUS) 1 % cream    Sig: Apply topically 2 (two) times daily.    Dispense:  14.2 g    Refill:  5   oxyCODONE-acetaminophen (PERCOCET/ROXICET) 5-325 MG tablet    Sig: Take 1 tablet by mouth every 4 (four) hours as needed for severe pain.    Dispense:  20 tablet    Refill:  0       Shelly Bombard, MD 05/24/2022 12:52 PM

## 2022-05-26 ENCOUNTER — Encounter (HOSPITAL_COMMUNITY): Payer: Self-pay

## 2022-05-26 ENCOUNTER — Ambulatory Visit: Payer: Medicaid Other | Admitting: Physician Assistant

## 2022-05-26 ENCOUNTER — Emergency Department (HOSPITAL_COMMUNITY)
Admission: EM | Admit: 2022-05-26 | Discharge: 2022-05-26 | Discharge status: Against Medical Advice | Payer: Medicaid Other | Attending: Emergency Medicine | Admitting: Emergency Medicine

## 2022-05-26 DIAGNOSIS — K649 Unspecified hemorrhoids: Secondary | ICD-10-CM

## 2022-05-26 DIAGNOSIS — K625 Hemorrhage of anus and rectum: Secondary | ICD-10-CM | POA: Diagnosis present

## 2022-05-26 DIAGNOSIS — Z5321 Procedure and treatment not carried out due to patient leaving prior to being seen by health care provider: Secondary | ICD-10-CM | POA: Insufficient documentation

## 2022-05-26 NOTE — ED Triage Notes (Signed)
Pt c/o rectal bleeding with every BM and occasionally minor leakage. Bright red blood. Been using tucks pads, oxy for pain with relief, and preparation H which is burning. No external hemorrhoids seen by dr.

## 2022-05-26 NOTE — ED Notes (Signed)
Went to collect labs, pt not in room at this current time

## 2022-05-26 NOTE — ED Provider Notes (Signed)
[place] DEPT Provider Note   CSN: [id number] Arrival date & time: 05/26/22  0930     History  Chief Complaint  Patient presents with   Rectal Bleeding    Brittany Hendrix is a 31 y.o. female.  31 year old female with a past medical history of hemorrhoids presents to the ED with a chief complaint of rectal bleeding for the past 2 weeks.  Patient previously evaluated urgent care, [facility] ER along with her PCP.  Having multiple episodes of rectal bleeding, exacerbated with any type of bowel movement.  She had appointment with gastroenterology this morning however arrived they related they told her they could not see her, however they were able to schedule for tomorrow however patient reports she will not be able to make this appointment.  She has tried Preparation H, Tucks, over-the-counter treatments for pain control without any improvement in her symptoms.  Denies any prior history of anemia, no fever, no abdominal pain.  The history is provided by the patient.  Rectal Bleeding Quality:  Bright red Amount:  Moderate Duration:  2 weeks Timing:  Intermittent Chronicity:  New Context: anal fissures, hemorrhoids and rectal pain   Context: not defecation   Pain details:    Quality:  Tearing   Severity:  Moderate   Duration:  2 weeks   Timing:  Worse with defecation   Progression:  Unchanged Similar prior episodes: yes   Associated symptoms: abdominal pain and dizziness   Associated symptoms: no fever and no vomiting   Risk factors: no anticoagulant use, no hx of colorectal cancer, no hx of colorectal surgery, no hx of IBD and no NSAID use        Home Medications Prior to Admission medications   Medication Sig Start Date End Date Taking? Authorizing Provider  ferrous sulfate 324 (65 Fe) MG TBEC Take 1 tablet (325 mg total) by mouth every other day. Patient not taking: Reported on 12/30/2021 12/25/21   Myrtis Ser, CNM   hydrocortisone-pramoxine [facility]) rectal foam Place 1 applicator rectally 2 (two) times daily. Patient not taking: Reported on 06/02/2022 05/24/22   Shelly Bombard, MD  ibuprofen (ADVIL) 600 MG tablet Take 1 tablet (600 mg total) by mouth every 6 (six) hours as needed. 12/25/21   Myrtis Ser, CNM  levETIRAcetam (KEPPRA) 1000 MG tablet Take 1 tablet (1,000 mg total) by mouth 2 (two) times daily. 07/06/21   Marcial Pacas, MD  neomycin-polymyxin-pramoxine (NEOSPORIN PLUS) 1 % cream Apply topically 2 (two) times daily. Patient not taking: Reported on 06/02/2022 05/24/22   Shelly Bombard, MD  Norethindrone Acetate-Ethinyl Estrad-FE (LOESTRIN 24 FE) 1-20 MG-MCG(24) tablet Take 1 tablet by mouth daily. Patient not taking: Reported on 06/02/2022 02/03/22   Griffin Basil, MD  oxyCODONE (OXY IR/ROXICODONE) 5 MG immediate release tablet Take 1-2 tablets (5-10 mg total) by mouth every 4 (four) hours as needed for moderate pain. Patient not taking: Reported on 02/03/2022 12/25/21   Myrtis Ser, CNM  oxyCODONE-acetaminophen (PERCOCET/ROXICET) 5-325 MG tablet Take 1 tablet by mouth every 4 (four) hours as needed for severe pain. Patient not taking: Reported on 06/02/2022 05/24/22   Shelly Bombard, MD      Allergies    Patient has no known allergies.    Review of Systems   Review of Systems  Constitutional:  Negative for chills and fever.  Respiratory:  Negative for shortness of breath.   Cardiovascular:  Negative for chest pain.  Gastrointestinal:  Positive for abdominal pain, diarrhea and hematochezia. Negative for nausea and vomiting.  Genitourinary:  Negative for flank pain.  Neurological:  Positive for dizziness.  All other systems reviewed and are negative.   Physical Exam Updated Vital Signs BP 114/72 (BP Location: Left Arm)   Pulse 60   Temp 98.4 F (36.9 C) (Oral)   Resp 16   LMP 04/29/2022 (Exact Date)   SpO2 99%  Physical Exam Vitals and nursing note reviewed. Exam  conducted with a chaperone present.  Constitutional:      Appearance: Normal appearance.  HENT:     Head: Normocephalic and atraumatic.     Mouth/Throat:     Mouth: Mucous membranes are moist.  Cardiovascular:     Rate and Rhythm: Normal rate.  Pulmonary:     Effort: Pulmonary effort is normal.  Abdominal:     General: Abdomen is flat.     Palpations: Abdomen is soft.     Tenderness: There is no abdominal tenderness.  Genitourinary:    Comments: Exam chaperoned by Delsa Grana, external fissures noted along with small amount of blood.  No pain in the surrounding area to suggest an abscess, no changes in the skin to suggest infection. Musculoskeletal:     Cervical back: Normal range of motion and neck supple.  Skin:    General: Skin is warm and dry.  Neurological:     Mental Status: She is alert and oriented to person, place, and time.     ED Results / Procedures / Treatments   Labs (all labs ordered are listed, but only abnormal results are displayed) Labs Reviewed - No data to display   EKG None  Radiology No results found.  Procedures Procedures    Medications Ordered in ED Medications - No data to display  ED Course/ Medical Decision Making/ A&P                           Medical Decision Making Amount and/or Complexity of Data Reviewed Labs: ordered.   Patient presents to the ED with a chief complaint of rectal bleeding over the last 2 weeks, try to be evaluated by gastroenterology this morning however missed her appointment, she scheduled to be seen tomorrow however reports she cannot be seen due to transportation.  Bleeding is worse with any defecation, she is somewhat spotting when she is not having bowel movements.  Also on arrival stable, no tachycardia, no hypoxia.  She does not have any prior history of anemia.  I discussed with her checking basic blood work to rule out any new anemia at this time.  When I was able to go back into talk to patient prior  to getting a blood work, I was informed by nursing staff that patient had left AGAINST MEDICAL ADVICE.  We were unable to fully complete her workup.  I do feel like no acute workup was needed at this time as she has appropriate follow-up with gastroenterology.   Portions of this note were generated with Scientist, clinical (histocompatibility and immunogenetics). Dictation errors may occur despite best attempts at proofreading.  Final Clinical Impression(s) / ED Diagnoses Final diagnoses:  Rectal bleeding  Hemorrhoids, unspecified hemorrhoid type    Rx / DC Orders ED Discharge Orders     None         Claude Manges, PA-C 06/15/22 [id number]    Cathren Laine, MD 06/15/22 1101

## 2022-05-27 ENCOUNTER — Ambulatory Visit: Payer: Medicaid Other | Admitting: Physician Assistant

## 2022-06-02 ENCOUNTER — Other Ambulatory Visit: Payer: Self-pay | Admitting: Obstetrics

## 2022-06-02 ENCOUNTER — Ambulatory Visit (INDEPENDENT_AMBULATORY_CARE_PROVIDER_SITE_OTHER): Payer: Medicaid Other | Admitting: Gastroenterology

## 2022-06-02 ENCOUNTER — Encounter: Payer: Self-pay | Admitting: Gastroenterology

## 2022-06-02 VITALS — BP 110/72 | HR 80 | Ht 64.0 in | Wt 232.0 lb

## 2022-06-02 DIAGNOSIS — K602 Anal fissure, unspecified: Secondary | ICD-10-CM | POA: Diagnosis not present

## 2022-06-02 DIAGNOSIS — K625 Hemorrhage of anus and rectum: Secondary | ICD-10-CM | POA: Diagnosis not present

## 2022-06-02 NOTE — Patient Instructions (Signed)
_______________________________________________________  If you are age 31 or older, your body mass index should be between 23-30. Your Body mass index is 39.82 kg/m. If this is out of the aforementioned range listed, please consider follow up with your Primary Care Provider.  If you are age 20 or younger, your body mass index should be between 19-25. Your Body mass index is 39.82 kg/m. If this is out of the aformentioned range listed, please consider follow up with your Primary Care Provider.   ________________________________________________________  The [facility] providers would like to encourage you to use [facility] to communicate with providers for non-urgent requests or questions.  Due to long hold times on the telephone, sending your provider a message by [facility] may be a faster and more efficient way to get a response.  Please allow 48 business hours for a response.  Please remember that this is for non-urgent requests.  ______________________________________________________  Use 1/2 a capful of Miralax daily to keep stools soft and easy to pass.   Use over the counter Recticare 2-3 times a day.    It was a pleasure to see you today!  Thank you for trusting me with your gastrointestinal care!

## 2022-06-02 NOTE — Progress Notes (Signed)
[facility] Consult Note:  History: Brittany Hendrix 06/02/2022  Referring provider: Dr. Coral Ceo (OB/GYN)  Reason for consult/chief complaint: Hemorrhoids (Bleeding, onset x 2 weeks, painful at onset )   Subjective  HPI: Brittany Hendrix was referred by her gynecologist Dr. Clearance Coots for rectal pain bleeding and concern for hemorrhoids.  She went to the ED for this on 05/23/2022, and saw Dr. Clearance Coots in the office the following day.  His exam indicates tenderness on DRE, various ointments and Proctofoam were prescribed.  She went back to the ED 05/26/22 but left without being seen due to long waiting time.  Brittany Hendrix had an episode of constipation about 2 weeks ago that she attributes to something she ate.  She then developed acute onset anal/rectal pain and bleeding that she found very worrisome, and showed me some photos of bloody stool. Pain subsided somewhat, she had some bleeding this morning.  She tended toward underlying constipation that developed prior to pregnancy and delivering her child in June however that constipation did worsen during the pregnancy.  ROS:  Review of Systems  Constitutional:  Negative for appetite change and unexpected weight change.  HENT:  Negative for mouth sores and voice change.   Eyes:  Negative for pain and redness.  Respiratory:  Negative for cough and shortness of breath.   Cardiovascular:  Negative for chest pain and palpitations.  Genitourinary:  Negative for dysuria and hematuria.  Musculoskeletal:  Negative for arthralgias and myalgias.  Skin:  Negative for pallor and rash.  Neurological:  Positive for headaches. Negative for weakness.  Hematological:  Negative for adenopathy.     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 [ ]  Inform patient Per up-to-date "considered to be a normal variant. Patients are asymptomatic, have no  compromise of fertility, and similar pregnancy outcomes as those in the general obstetric population. "    Headache(784.0)    Seizures (HCC)    Last seizure May 25 2020     Past Surgical History: Past Surgical History:  Procedure Laterality Date   CESAREAN SECTION N/A 05/20/2020   Procedure: CESAREAN SECTION;  Surgeon: 05/22/2020, MD;  Location: [facility] ORS;  Service: Obstetrics;  Laterality: N/A;   CESAREAN SECTION N/A 12/23/2021   Procedure: CESAREAN SECTION;  Surgeon: 02/22/2022, MD;  Location: [facility] ORS;  Service: Obstetrics;  Laterality: N/A;   DILATION AND CURETTAGE OF UTERUS     abortion   DILATION AND EVACUATION N/A 10/04/2017   Procedure: DILATATION AND EVACUATION;  Surgeon: 10/06/2017, MD;  Location: [facility] ORS;  Service: Gynecology;  Laterality: N/A;   DILATION AND EVACUATION N/A 12/23/2020   Procedure: DILATATION AND EVACUATION;  Surgeon: 02/22/2021, MD;  Location: [facility] OR;  Service: Gynecology;  Laterality: N/A;   OPERATIVE ULTRASOUND N/A 12/23/2020   Procedure: OPERATIVE ULTRASOUND;  Surgeon: 02/22/2021, MD;  Location: [facility] OR;  Service: Gynecology;  Laterality: N/A;     Family History: Family History  Problem Relation Age of Onset   Diabetes Mother    Other Father        Hx unknown   Asthma Brother    Colon cancer Neg Hx    Esophageal cancer Neg Hx    Rectal cancer Neg Hx     Social History: Social History   Socioeconomic History   Marital status: Single    Spouse name:  Not on file   Number of children: 3   Years of education: 12   Highest education level: Not on file  Occupational History   Occupation: [employer]  Tobacco Use   Smoking status: Every Day    Packs/day: 0.50    Years: 5.00    Total pack years: 2.50    Types: Cigarettes    Passive exposure: Past   Smokeless tobacco: Never  Vaping Use   Vaping Use: Some days  Substance and Sexual Activity   Alcohol use: Yes    Comment: occasional, not while preg   Drug use: Yes     Types: Marijuana    Comment: everyday   Sexual activity: Not Currently    Partners: Male    Birth control/protection: None  Other Topics Concern   Not on file  Social History Narrative   Patient is single and lives at home alone. Patient works at [employer]. Right handed. Caffeine three daily.   Social Determinants of Health   Financial Resource Strain: Not on file  Food Insecurity: Not on file  Transportation Needs: Not on file  Physical Activity: Not on file  Stress: Not on file  Social Connections: Not on file    Allergies: No Known Allergies  Outpatient Meds: Current Outpatient Medications  Medication Sig Dispense Refill   ibuprofen (ADVIL) 600 MG tablet Take 1 tablet (600 mg total) by mouth every 6 (six) hours as needed. 30 tablet 0   levETIRAcetam (KEPPRA) 1000 MG tablet Take 1 tablet (1,000 mg total) by mouth 2 (two) times daily. 180 tablet 4   ferrous sulfate 324 (65 Fe) MG TBEC Take 1 tablet (325 mg total) by mouth every other day. (Patient not taking: Reported on 12/30/2021) 30 tablet 3   hydrocortisone-pramoxine (PROCTOFOAM-HC) rectal foam Place 1 applicator rectally 2 (two) times daily. (Patient not taking: Reported on 06/02/2022) 10 g 11   neomycin-polymyxin-pramoxine (NEOSPORIN PLUS) 1 % cream Apply topically 2 (two) times daily. (Patient not taking: Reported on 06/02/2022) 14.2 g 5   Norethindrone Acetate-Ethinyl Estrad-FE (LOESTRIN 24 FE) 1-20 MG-MCG(24) tablet Take 1 tablet by mouth daily. (Patient not taking: Reported on 06/02/2022) 28 tablet 11   oxyCODONE (OXY IR/ROXICODONE) 5 MG immediate release tablet Take 1-2 tablets (5-10 mg total) by mouth every 4 (four) hours as needed for moderate pain. (Patient not taking: Reported on 02/03/2022) 30 tablet 0   oxyCODONE-acetaminophen (PERCOCET/ROXICET) 5-325 MG tablet Take 1 tablet by mouth every 4 (four) hours as needed for severe pain. (Patient not taking: Reported on 06/02/2022) 20 tablet 0   No current  facility-administered medications for this visit.      ___________________________________________________________________ Objective   Exam:  BP 110/72   Pulse 80   Ht 5\' 4"  (1.626 m)   Wt 232 lb (105.2 kg)   LMP 04/29/2022 (Exact Date)   BMI 39.82 kg/m  Wt Readings from Last 3 Encounters:  06/02/22 232 lb (105.2 kg)  05/24/22 232 lb (105.2 kg)  05/23/22 230 lb (104.3 kg)  05/25/22, CMA    present for entire exam  General: Well-appearing Eyes: sclera anicteric, no redness ENT: oral mucosa moist without lesions, no cervical or supraclavicular lymphadenopathy CV: Regular without murmur, no JVD, no peripheral edema Resp: clear to auscultation bilaterally, normal RR and effort noted GI: soft, obese, no tenderness, with active bowel sounds. No guarding or palpable organomegaly noted. Skin; warm and dry, no rash or jaundice noted.  Tattoos   scar on the right side of  the face and part of the right earlobe missing from prior injury Neuro: awake, alert and oriented x 3. Normal gross motor function and fluent speech Perianal exam normal.  DRE with a palpable tender posterior anal fissure, no palpable internal lesions. Labs:     Latest Ref Rng & Units 05/23/2022    8:09 AM 12/24/2021    5:00 AM 12/21/2021    9:44 AM  CBC  WBC 4.0 - 10.5 K/uL 7.3  8.0  7.5   Hemoglobin 12.0 - 15.0 g/dL 81.4  7.9  48.1   Hematocrit 36.0 - 46.0 % 39.1  24.1  31.9   Platelets 150 - 400 K/uL 245  152  219       Latest Ref Rng & Units 05/23/2022    8:09 AM 12/23/2021    4:38 PM 12/25/2020   12:29 AM  CMP  Glucose 70 - 99 mg/dL 856   97   BUN 6 - 20 mg/dL 10   13   Creatinine 3.14 - 1.00 mg/dL 9.70  2.63  7.85   Sodium 135 - 145 mmol/L 140   139   Potassium 3.5 - 5.1 mmol/L 4.2   3.2   Chloride 98 - 111 mmol/L 110   105   CO2 22 - 32 mmol/L 22   22   Calcium 8.9 - 10.3 mg/dL 8.9   8.7   Total Protein 6.5 - 8.1 g/dL 6.7     Total Bilirubin 0.3 - 1.2 mg/dL 0.5     Alkaline Phos 38 - 126 U/L 68      AST 15 - 41 U/L 13     ALT 0 - 44 U/L 13        Assessment: Encounter Diagnoses  Name Primary?   Anal fissure Yes   Anal bleeding     This fissure occurred after recent episode of constipation. While she could have internal hemorrhoids as well, I believe the fissure explains both the pain and the bleeding at present.  Anoscopy not performed due to the discomfort because from the fissure.  Plan:  Instructions given for RectiCare ointment 3 times daily Half a capful of MiraLAX in the large glass of water daily Clinic appointment with APP in several weeks to reassess symptoms and examine the fissure. Discontinue iron tablets  Thank you for the courtesy of this consult.  Please call me with any questions or concerns.  Charlie Pitter III  CC: Referring provider noted above

## 2022-06-14 NOTE — Progress Notes (Unsigned)
Patient: Brittany Hendrix Date of Birth: Apr 26, 1991  Reason for Visit: Follow up History from: Patient Primary Neurologist: Terrace [place]  ASSESSMENT AND PLAN 31 y.o. year old female   1.  Epilepsy -No recent seizures (last was in June 2022, missed dose) -Continue Keppra 1000 mg twice a day -Check Keppra level for baseline -Call for seizure activity, otherwise follow-up in 1 year, is on birth control, is not planning pregnancy, could benefit from daily vitamin with folic acid   HISTORY  Brittany Hendrix is a 31 years old female, with epilepsy disorder, currently taking Keppra 500 in the morning, 1000 mg in the evening.    She had seizures since she was 52-93 years old.  All seizure has similar seminology. She has an aura before those seizures, she feels drowsy and sometimes nauseated, lasting few minutes, followed by loss of conciousness, whole body shaking, then post ictal confusion usually last 20-30 minutes..    She has injured herself in the face and on her leg  during seizures in the past. She was not treated with anti-epilepsic medications, until after she had a recurrent seizure in May 2012, she was put on Levetiracetam 500 mg b.i.d.    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  her bloodwork tested positive for marijuana.    For a while, she was noncompliant with her medications, also concerned about the medication costs, she continue have recurrent seizures.    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 EEG was normal   She had recurrent seizure in May 2014, 2 seizures in 1 day while taking Keppra 500 twice daily, dosage was increased to 500/1000.     For a while, she struggled to pay for her medications,    Recurrent seizure September 17, 2015 while she was [redacted] weeks  pregnant, preceded by body jerking movement, while taking Keppra 1500 mg daily   Then April 03, 2017, May 24, 2020 following hollowing party, with excessive alcohol use, sleep deprived,   She had 2 previous pregnancy with normal children taking Keppra 1000 twice a day, does not want to increase her dosage now,  Last reported seizure June 2022 missing her morning dose of medications, Keppra level Nov 25, 2019 was 26.9   Update Dec 07, 2021 SS: Here today for virtual visit, is [redacted] weeks pregnant, scheduled for C-section due to placenta previa 01/06/22.  Remains on Keppra 1000 mg twice daily.  Denies any seizure activity.  Never had labs done for Keppra level.  Does not wish to increase the dose.  Transportation is an issue for her.  Update June 15, 2022 SS: Has 31 month old son, was induced for Placenta Previa, had c-section, baby is doing well. Is not breastfeeding. No seizures. Remains on Keppra 1000 mg twice daily. She doesn't drive. Works at [employer]. Is taking birth control pills. She did not take her Keppra today.   REVIEW OF SYSTEMS: Out of a complete 14 system review of symptoms, the patient complains only of the following symptoms, and all other reviewed systems are negative.  See HPI  ALLERGIES: No Known Allergies  HOME MEDICATIONS: Outpatient Medications Prior to Visit  Medication Sig Dispense Refill   ibuprofen (ADVIL) 600 MG tablet Take 1 tablet (600 mg total) by mouth every 6 (six)  hours as needed. 30 tablet 0   levETIRAcetam (KEPPRA) 1000 MG tablet Take 1 tablet (1,000 mg total) by mouth 2 (two) times daily. 180 tablet 4   ferrous sulfate 324 (65 Fe) MG TBEC Take 1 tablet (325 mg total) by mouth every other day. (Patient not taking: Reported on 12/30/2021) 30 tablet 3   hydrocortisone-pramoxine (PROCTOFOAM-HC) rectal foam Place 1 applicator rectally 2 (two) times daily. (Patient not taking: Reported on 06/02/2022) 10 g 11   neomycin-polymyxin-pramoxine (NEOSPORIN PLUS)  1 % cream Apply topically 2 (two) times daily. (Patient not taking: Reported on 06/02/2022) 14.2 g 5   Norethindrone Acetate-Ethinyl Estrad-FE (LOESTRIN 24 FE) 1-20 MG-MCG(24) tablet Take 1 tablet by mouth daily. (Patient not taking: Reported on 06/02/2022) 28 tablet 11   oxyCODONE (OXY IR/ROXICODONE) 5 MG immediate release tablet Take 1-2 tablets (5-10 mg total) by mouth every 4 (four) hours as needed for moderate pain. (Patient not taking: Reported on 02/03/2022) 30 tablet 0   oxyCODONE-acetaminophen (PERCOCET/ROXICET) 5-325 MG tablet Take 1 tablet by mouth every 4 (four) hours as needed for severe pain. (Patient not taking: Reported on 06/02/2022) 20 tablet 0   No facility-administered medications prior to visit.    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 [ ]  Inform patient Per up-to-date "considered to be a normal variant. Patients are asymptomatic, have no compromise of fertility, and similar pregnancy outcomes as those in the general obstetric population. "    Headache(784.0)    Seizures (HCC)    Last seizure May 25 2020    PAST SURGICAL HISTORY: Past Surgical History:  Procedure Laterality Date   CESAREAN SECTION N/A 05/20/2020   Procedure: CESAREAN SECTION;  Surgeon: 05/22/2020, MD;  Location: [facility];  Service: Obstetrics;  Laterality: N/A;   CESAREAN SECTION N/A 12/23/2021   Procedure: CESAREAN SECTION;  Surgeon: 02/22/2022, MD;  Location: [facility];  Service: Obstetrics;  Laterality: N/A;   DILATION AND CURETTAGE OF UTERUS     abortion   DILATION AND EVACUATION N/A 10/04/2017   Procedure: DILATATION AND EVACUATION;  Surgeon: 10/06/2017, MD;  Location: [facility] ORS;  Service: Gynecology;  Laterality: N/A;   DILATION AND EVACUATION N/A 12/23/2020   Procedure: DILATATION AND EVACUATION;  Surgeon: 02/22/2021, MD;  Location: [facility] OR;  Service: Gynecology;  Laterality:  N/A;   OPERATIVE ULTRASOUND N/A 12/23/2020   Procedure: OPERATIVE ULTRASOUND;  Surgeon: 02/22/2021, MD;  Location: [facility] OR;  Service: Gynecology;  Laterality: N/A;    FAMILY HISTORY: Family History  Problem Relation Age of Onset   Diabetes Mother    Other Father        Hx unknown   Asthma Brother    Colon cancer Neg Hx    Esophageal cancer Neg Hx    Rectal cancer Neg Hx     SOCIAL HISTORY: Social History   Socioeconomic History   Marital status: Single    Spouse name: Not on file   Number of children: 3   Years of education: 12   Highest education level: Not on file  Occupational History   Occupation: [employer]  Tobacco Use   Smoking status: Every Day    Packs/day: 0.50    Years: 5.00    Total pack years: 2.50    Types: Cigarettes    Passive exposure: Past   Smokeless tobacco: Never  Vaping  Use   Vaping Use: Some days  Substance and Sexual Activity   Alcohol use: Yes    Comment: occasional, not while preg   Drug use: Yes    Types: Marijuana    Comment: everyday   Sexual activity: Not Currently    Partners: Male    Birth control/protection: None  Other Topics Concern   Not on file  Social History Narrative   Patient is single and lives at home alone. Patient works at [employer]. Right handed. Caffeine three daily.   Social Determinants of Health   Financial Resource Strain: Not on file  Food Insecurity: Not on file  Transportation Needs: Not on file  Physical Activity: Not on file  Stress: Not on file  Social Connections: Not on file  Intimate Partner Violence: Not on file    PHYSICAL EXAM  Vitals:   06/15/22 0935  BP: 112/76  Pulse: 70  Weight: 236 lb (107 kg)  Height: 5\' 4"  (1.626 m)   Body mass index is 40.51 kg/m.  Generalized: Well developed, in no acute distress  Neurological examination  Mentation: Alert oriented to time, place, history taking. Follows all commands speech and language fluent Cranial nerve II-XII: Pupils were  equal round reactive to light. Extraocular movements were full, visual field were full on confrontational test. Facial sensation and strength were normal. Head turning and shoulder shrug  were normal and symmetric. Motor: The motor testing reveals 5 over 5 strength of all 4 extremities. Good symmetric motor tone is noted throughout.  Sensory: Sensory testing is intact to soft touch on all 4 extremities. No evidence of extinction is noted.  Coordination: Cerebellar testing reveals good finger-nose-finger and heel-to-shin bilaterally.  Gait and station: Gait is normal. .  Reflexes: Deep tendon reflexes are symmetric and normal bilaterally.   DIAGNOSTIC DATA (LABS, IMAGING, TESTING) - I reviewed patient records, labs, notes, testing and imaging myself where available.  Lab Results  Component Value Date   WBC 7.3 05/23/2022   HGB 12.7 05/23/2022   HCT 39.1 05/23/2022   MCV 95.4 05/23/2022   PLT 245 05/23/2022      Component Value Date/Time   NA 140 05/23/2022 0809   K 4.2 05/23/2022 0809   CL 110 05/23/2022 0809   CO2 22 05/23/2022 0809   GLUCOSE 100 (H) 05/23/2022 0809   BUN 10 05/23/2022 0809   CREATININE 0.67 05/23/2022 0809   CALCIUM 8.9 05/23/2022 0809   PROT 6.7 05/23/2022 0809   ALBUMIN 3.7 05/23/2022 0809   AST 13 (L) 05/23/2022 0809   ALT 13 05/23/2022 0809   ALKPHOS 68 05/23/2022 0809   BILITOT 0.5 05/23/2022 0809   GFRNONAA >60 05/23/2022 0809   GFRAA >60 10/07/2019 2317   No results found for: "CHOL", "HDL", "LDLCALC", "LDLDIRECT", "TRIG", "CHOLHDL" Lab Results  Component Value Date   HGBA1C 5.2 06/16/2021   No results found for: "VITAMINB12" Lab Results  Component Value Date   TSH 0.541 11/25/2019    01/25/2020, AGNP-C, DNP 06/15/2022, 9:55 AM [facility] [address], [address] [place], [place] [phone]

## 2022-06-15 ENCOUNTER — Encounter: Payer: Self-pay | Admitting: Neurology

## 2022-06-15 ENCOUNTER — Ambulatory Visit: Payer: Medicaid Other | Admitting: Neurology

## 2022-06-15 VITALS — BP 112/76 | HR 70 | Ht 64.0 in | Wt 236.0 lb

## 2022-06-15 DIAGNOSIS — G40909 Epilepsy, unspecified, not intractable, without status epilepticus: Secondary | ICD-10-CM

## 2022-06-15 MED ORDER — LEVETIRACETAM 1000 MG PO TABS
1000.0000 mg | ORAL_TABLET | Freq: Two times a day (BID) | ORAL | 4 refills | Status: DC
Start: 2022-06-15 — End: 2023-07-06

## 2022-06-16 LAB — LEVETIRACETAM LEVEL: Levetiracetam Lvl: 19.8 ug/mL (ref 10.0–40.0)

## 2022-07-07 ENCOUNTER — Ambulatory Visit: Payer: Medicaid Other | Admitting: Gastroenterology

## 2022-11-14 ENCOUNTER — Ambulatory Visit (LOCAL_COMMUNITY_HEALTH_CENTER): Payer: Medicaid Other

## 2022-11-14 VITALS — BP 125/66 | Ht 64.0 in | Wt 241.5 lb

## 2022-11-14 DIAGNOSIS — Z3202 Encounter for pregnancy test, result negative: Secondary | ICD-10-CM

## 2022-11-14 LAB — PREGNANCY, URINE: Preg Test, Ur: NEGATIVE

## 2022-11-14 NOTE — Progress Notes (Signed)
Client seen in nurse clinic for UPT. Results Negative.  Negative UPT pregnancy packet given to patient.   Copy of local providers given to patient.  Patient was not interested in receiving information about Birth Control today.    Allister Lessley Sherrilyn Rist, RN

## 2023-01-30 ENCOUNTER — Ambulatory Visit (INDEPENDENT_AMBULATORY_CARE_PROVIDER_SITE_OTHER): Payer: Medicaid Other | Admitting: *Deleted

## 2023-01-30 VITALS — BP 107/71

## 2023-01-30 DIAGNOSIS — Z3201 Encounter for pregnancy test, result positive: Secondary | ICD-10-CM | POA: Diagnosis not present

## 2023-01-30 LAB — POCT URINE PREGNANCY: Preg Test, Ur: POSITIVE — AB

## 2023-01-30 NOTE — Progress Notes (Signed)
[name] here for a UPT. Pt had a positive upt at home. LMP is 12/20/22.     UPT in office Positive.    Reviewed medications and informed to start a PNV, if not already. Pt to follow up in 3 weeks or so for New OB visit.

## 2023-01-31 NOTE — Progress Notes (Signed)
Patient was assessed and managed by nursing staff during this encounter. I have reviewed the chart and agree with the documentation and plan. I have also made any necessary editorial changes.  Warden Fillers, MD 01/31/2023 1:11 PM

## 2023-02-19 IMAGING — US US MFM OB FOLLOW-UP
1 series · 13 of 28 positions shown · non-contrast
Comparison: none

[Series 1: us mfm ob follow-up · 13 of 37 slices shown]
[im 2/37]
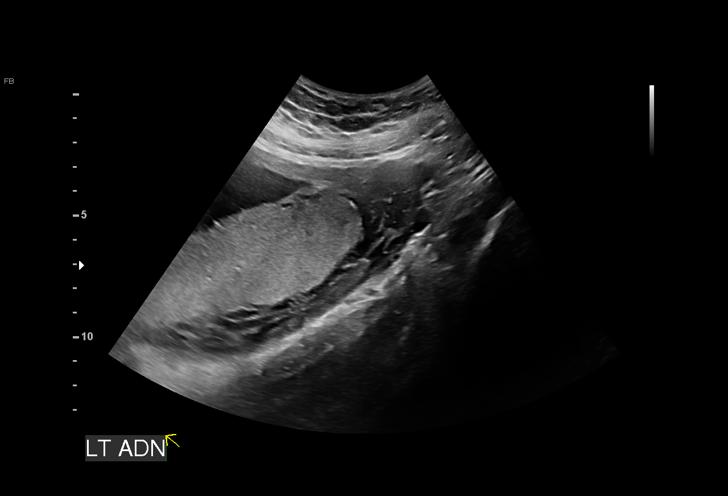
[im 5/37]
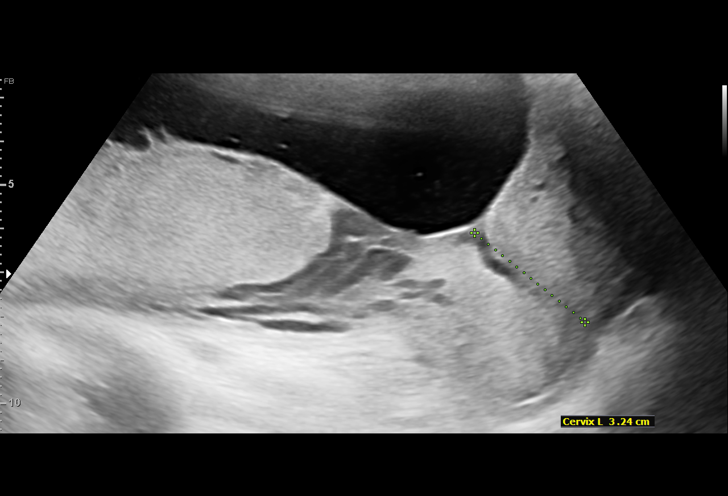
[im 7/37]
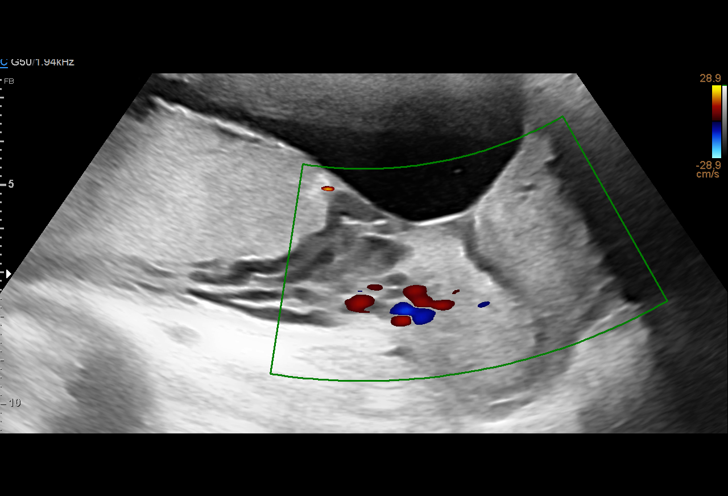
[im 10/37]
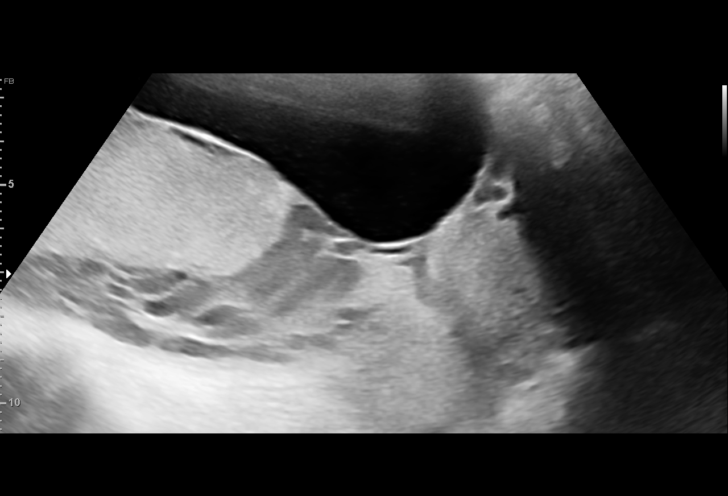
[im 13/37]
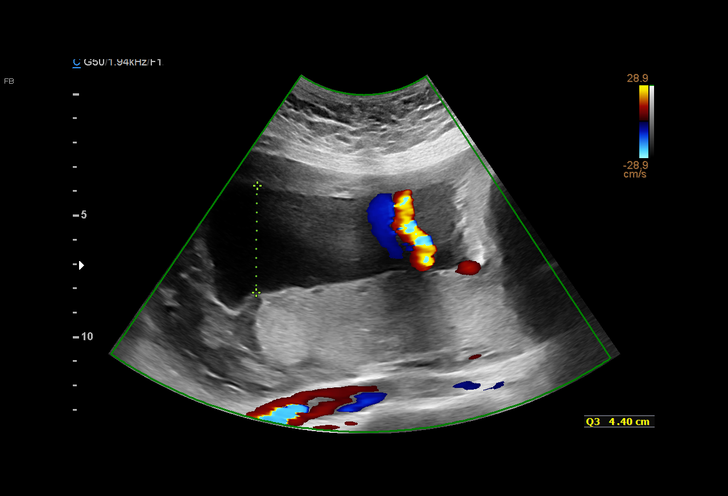
[im 15/37]
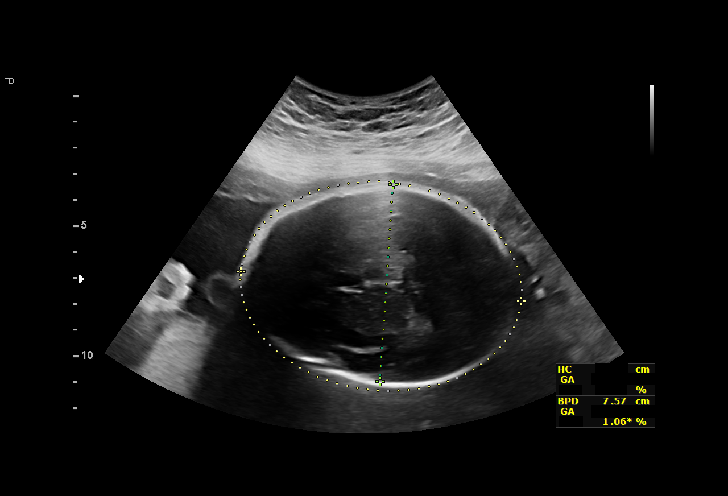
[im 19/37]
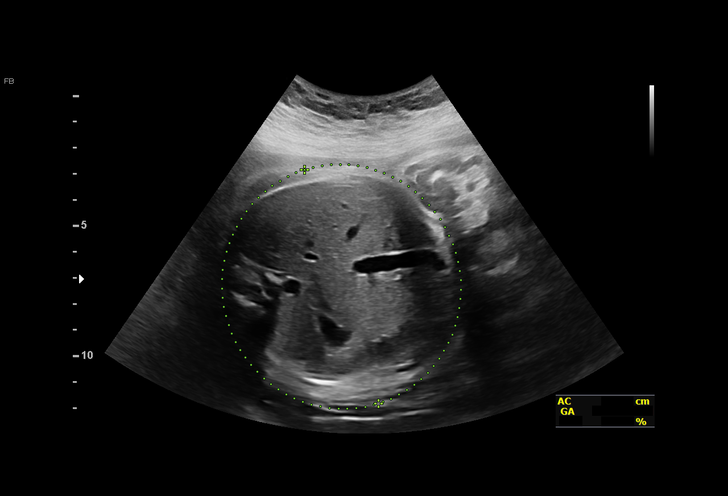
[im 22/37]
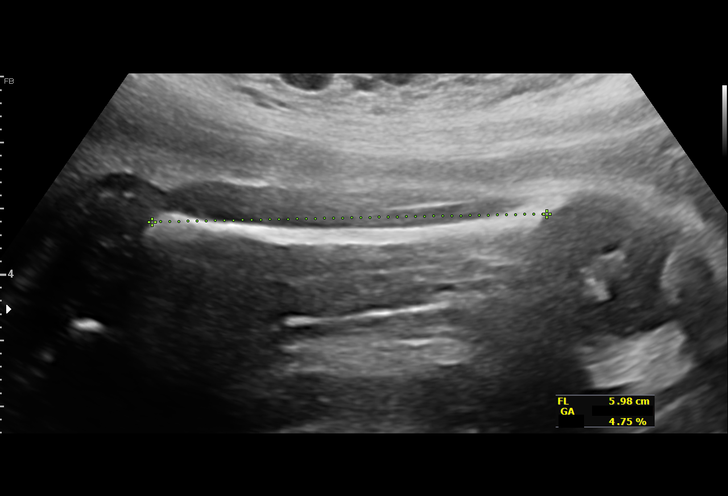
[im 25/37]
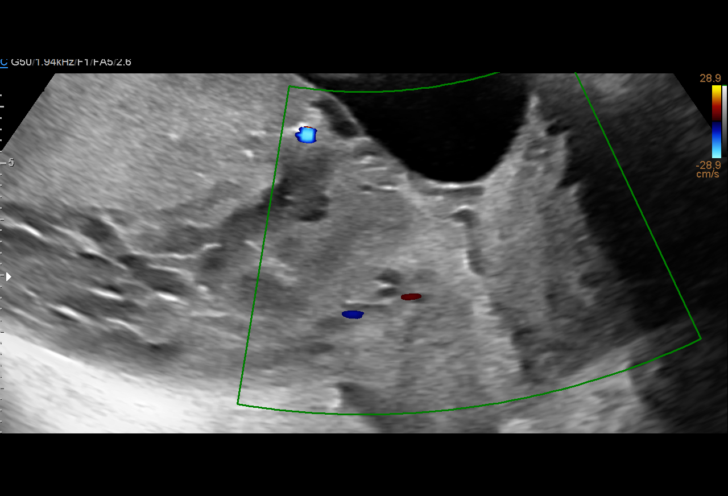
[im 27/37]
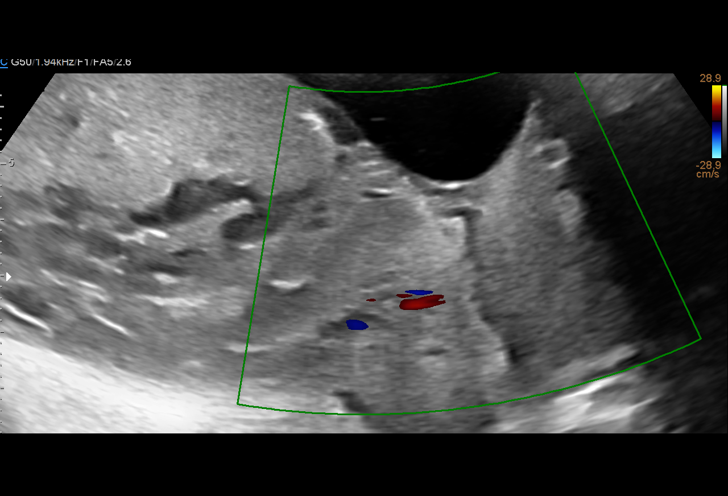
[im 30/37]
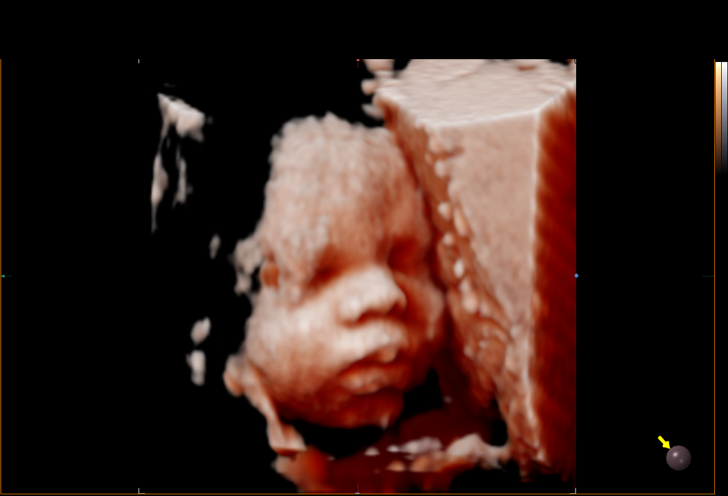
[im 33/37]
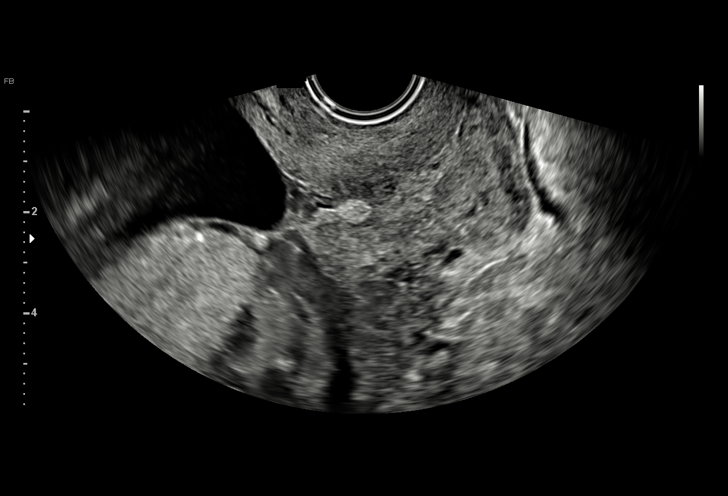
[im 35/37]
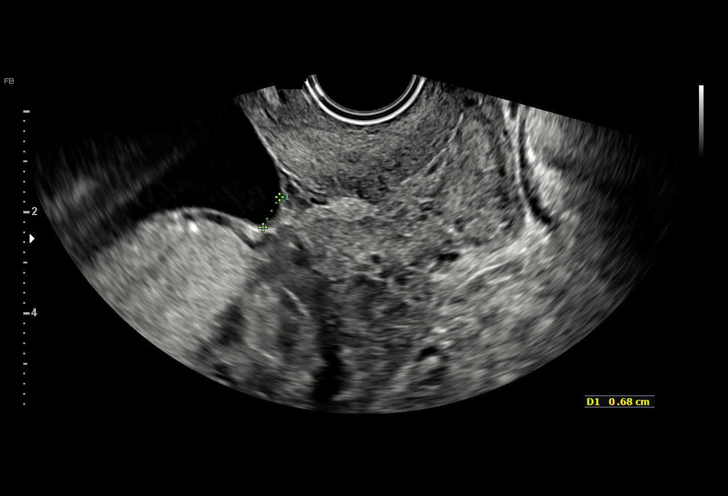

[13 of 28 positions shown; findings below may reference images not displayed]

[id number]!
 Ref. Address:     Faculty

                                                      MIKO
 2  US MFM OB TRANSVAGINAL                [address].2     KANEISHA
                                                      MIKO

Indications

 Placenta previa specified as without
 hemorrhage, third trimester
 Seizure disorder (keppra)
 Obesity complicating pregnancy
 Previous cesarean delivery, antepartum
 33 weeks gestation of pregnancy
 LR NIPS/AFP Neg
Fetal Evaluation

 Num Of Fetuses:         1
 Fetal Heart Rate(bpm):  125
 Cardiac Activity:       Observed
 Presentation:           Transverse, head to maternal left
 Placenta:               Posterior Previa
 P. Cord Insertion:      Previously Visualized

 Amniotic Fluid
 AFI FV:      Within normal limits

 AFI Sum(cm)     %Tile       Largest Pocket(cm)
 18.4            68
 RUQ(cm)       RLQ(cm)       LUQ(cm)        LLQ(cm)

Biometry

 BPD:      74.2  mm     G. Age:  29w 5d        < 1  %    CI:        64.57   %    70 - 86
                                                         FL/HC:      20.0   %    19.9 -
 HC:      296.9  mm     G. Age:  32w 6d         12  %    HC/AC:      1.02        0.96 -
 AC:      290.4  mm     G. Age:  33w 0d         52  %    FL/BPD:     80.1   %    71 - 87
 FL:       59.4  mm     G. Age:  31w 0d        3.6  %    FL/AC:      20.5   %    20 - 24

 Est. FW:    1930  gm      4 lb 3 oz     18  %
OB History

 Gravidity:    9         Term:   2        Prem:   0        SAB:   5
 TOP:          0       Ectopic:  0        Living: 2
Gestational Age

 LMP:           33w 0d        Date:  04/08/21                  EDD:   01/13/22
 U/S Today:     31w 5d                                        EDD:   01/22/22
 Best:          33w 0d     Det. By:  LMP  (04/08/21)          EDD:   01/13/22
Anatomy

 Cranium:               Appears normal         Aortic Arch:            Previously seen
 Cavum:                 Previously seen        Ductal Arch:            Previously seen
 Ventricles:            Previously seen        Diaphragm:              Appears normal
 Choroid Plexus:        Previously seen        Stomach:                Previously Seen
 Cerebellum:            Previously seen        Abdomen:                Previously seen
 Posterior Fossa:       Previously seen        Abdominal Wall:         Previously seen
 Nuchal Fold:           Previously seen        Cord Vessels:           Previously seen
 Face:                  Orbits and profile     Kidneys:                Appear normal
                        previously seen
 Lips:                  Previously seen        Bladder:                Appears normal
 Thoracic:              Appears normal         Spine:                  Previously seen
 Heart:                 Previously seen        Upper Extremities:      Previously seen
 RVOT:                  Previously seen        Lower Extremities:      Previously seen
 LVOT:                  Previously seen

 Other:  Fetus appears to be a male.
Cervix Uterus Adnexa

 Cervix
 Length:           3.24  cm.
 Normal appearance by transabdominal scan.

 Adnexa
 No abnormality visualized.
Impression
 Placenta previa. Patient was evaluated at the [facility]
 placenta for c/o vaginal bleeding. She does not have vaginal
 bleeding but reports "pink discharge."
 She takes Keppra and reports no recent seizures.
 On today's ultrasound, fetal growth is appropriate for
 gestational age.  Amniotic fluid is normal and good fetal
 activity seen.
 We performed transvaginal ultrasound to evaluate the
 placenta.  The cervix looks long and closed.  Placenta is
 posterior and the placental edge with subplacental venous
 plexus cysts extend over the internal os. No evidence of
 placenta accreta spectrum.
 Findings are consistent with placenta previa.
 I explained the findings with the help of ultrasound images.
 The likelihood of resolution of placenta previa with advancing
 gestation is low.  Patient is firm on her decision to undergo
 repeat cesarean delivery.
 I recommended delivery at 37 weeks gestation to prevent
 hemorrhagic complications from placenta previa.
 Patient agreed with my recommendation.
Recommendations

 -Repeat cesarean delivery at 37 weeks' gestation.
                Hien, [place]

## 2023-02-21 ENCOUNTER — Other Ambulatory Visit (INDEPENDENT_AMBULATORY_CARE_PROVIDER_SITE_OTHER): Payer: Medicaid Other

## 2023-02-21 ENCOUNTER — Ambulatory Visit (INDEPENDENT_AMBULATORY_CARE_PROVIDER_SITE_OTHER): Payer: Medicaid Other | Admitting: Obstetrics and Gynecology

## 2023-02-21 VITALS — BP 112/76 | HR 56 | Wt 226.0 lb

## 2023-02-21 DIAGNOSIS — O0990 Supervision of high risk pregnancy, unspecified, unspecified trimester: Secondary | ICD-10-CM | POA: Insufficient documentation

## 2023-02-21 DIAGNOSIS — Z3481 Encounter for supervision of other normal pregnancy, first trimester: Secondary | ICD-10-CM | POA: Diagnosis not present

## 2023-02-21 DIAGNOSIS — O0941 Supervision of pregnancy with grand multiparity, first trimester: Secondary | ICD-10-CM

## 2023-02-21 DIAGNOSIS — Z3A08 8 weeks gestation of pregnancy: Secondary | ICD-10-CM | POA: Diagnosis not present

## 2023-02-21 DIAGNOSIS — Z3201 Encounter for pregnancy test, result positive: Secondary | ICD-10-CM

## 2023-02-21 DIAGNOSIS — Z3A09 9 weeks gestation of pregnancy: Secondary | ICD-10-CM

## 2023-02-21 NOTE — Progress Notes (Signed)
New OB Intake  I explained I am completing New OB Intake today. We discussed EDD of 09/26/2023, by Last Menstrual Period. Pt is [id number]. I reviewed her allergies, medications and Medical/Surgical/OB history.    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  Patient also informed that the ultrasound is not being completed with the intent of assessing for fetal or placental anomalies or any pelvic abnormalities. Explained that the purpose of today's ultrasound is to assess for viability.  Patient acknowledges the purpose of the exam and the limitations of the study.     Delivery Plans Plans to deliver at [facility]. Discussed the nature of our practice with multiple providers including residents and students. Due to the size of the practice, the delivering provider may not be the same as those providing prenatal care.    Anatomy US Explained first scheduled [place] will be around 19 weeks.   Last Pap Diagnosis  Date Value Ref Range Status  11/07/2019   Final   - Negative for intraepithelial lesion or malignancy (NILM)    First visit review I reviewed new OB appt with patient. Explained pt will be seen by Dr Judd Lien at first visit.  Routine prenatal labs to be collected at new OB.    Scheryl Marten, RN 02/21/2023  11:06 AM

## 2023-03-08 ENCOUNTER — Telehealth: Payer: Self-pay | Admitting: Neurology

## 2023-03-08 NOTE — Telephone Encounter (Signed)
Unable to reach pt over the phone, sent mychart msg informing pt of need to reschedule 06/21/23 appt - NP out

## 2023-03-16 ENCOUNTER — Encounter: Payer: Self-pay | Admitting: Obstetrics and Gynecology

## 2023-03-16 ENCOUNTER — Other Ambulatory Visit (HOSPITAL_COMMUNITY)
Admission: RE | Admit: 2023-03-16 | Discharge: 2023-03-16 | Disposition: A | Discharge status: Home / Self Care | Payer: Medicaid Other | Source: Ambulatory Visit | Attending: Family Medicine | Admitting: Family Medicine

## 2023-03-16 ENCOUNTER — Ambulatory Visit (INDEPENDENT_AMBULATORY_CARE_PROVIDER_SITE_OTHER): Payer: Medicaid Other | Admitting: Obstetrics and Gynecology

## 2023-03-16 VITALS — BP 98/64 | HR 77 | Wt 226.0 lb

## 2023-03-16 DIAGNOSIS — Z124 Encounter for screening for malignant neoplasm of cervix: Secondary | ICD-10-CM | POA: Diagnosis present

## 2023-03-16 DIAGNOSIS — O2621 Pregnancy care for patient with recurrent pregnancy loss, first trimester: Secondary | ICD-10-CM

## 2023-03-16 DIAGNOSIS — O0991 Supervision of high risk pregnancy, unspecified, first trimester: Secondary | ICD-10-CM

## 2023-03-16 DIAGNOSIS — O2620 Pregnancy care for patient with recurrent pregnancy loss, unspecified trimester: Secondary | ICD-10-CM | POA: Insufficient documentation

## 2023-03-16 DIAGNOSIS — O34211 Maternal care for low transverse scar from previous cesarean delivery: Secondary | ICD-10-CM

## 2023-03-16 DIAGNOSIS — O99210 Obesity complicating pregnancy, unspecified trimester: Secondary | ICD-10-CM

## 2023-03-16 DIAGNOSIS — Z8759 Personal history of other complications of pregnancy, childbirth and the puerperium: Secondary | ICD-10-CM | POA: Insufficient documentation

## 2023-03-16 DIAGNOSIS — Z98891 History of uterine scar from previous surgery: Secondary | ICD-10-CM

## 2023-03-16 DIAGNOSIS — O99211 Obesity complicating pregnancy, first trimester: Secondary | ICD-10-CM

## 2023-03-16 DIAGNOSIS — Z3A12 12 weeks gestation of pregnancy: Secondary | ICD-10-CM

## 2023-03-16 DIAGNOSIS — O09511 Supervision of elderly primigravida, first trimester: Secondary | ICD-10-CM

## 2023-03-16 DIAGNOSIS — O99351 Diseases of the nervous system complicating pregnancy, first trimester: Secondary | ICD-10-CM | POA: Diagnosis not present

## 2023-03-16 DIAGNOSIS — O34219 Maternal care for unspecified type scar from previous cesarean delivery: Secondary | ICD-10-CM

## 2023-03-16 DIAGNOSIS — G40909 Epilepsy, unspecified, not intractable, without status epilepticus: Secondary | ICD-10-CM

## 2023-03-16 DIAGNOSIS — O0990 Supervision of high risk pregnancy, unspecified, unspecified trimester: Secondary | ICD-10-CM

## 2023-03-16 MED ORDER — ASPIRIN 81 MG PO TBEC: 81.0000 mg | DELAYED_RELEASE_TABLET | Freq: Every day | ORAL | 12 refills | Status: DC

## 2023-03-16 NOTE — Progress Notes (Signed)
INITIAL PRENATAL VISIT NOTE  Subjective:  Brittany Hendrix is a 32 y.o. [id number] at [redacted]w[redacted]d by LMP being seen today for her initial prenatal visit. She has an obstetric history significant for 1 x  SVD, then 2 c-section with most recent due to placenta previa at 37 weeks. She has a medical history significant for seizure disorder on keppra, sees [facility] Neurology. They kept her on same dose throughout pregnancy last pregnancy, she has a seizure about once a year.  Patient reports fatigue.  Contractions: Not present. Vag. Bleeding: None.   . Denies leaking of fluid.    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 [ ]  Inform patient Per up-to-date "considered to be a normal variant. Patients are asymptomatic, have no compromise of fertility, and similar pregnancy outcomes as those in the general obstetric population. "    Headache(784.0)    Seizures (HCC)    Last seizure May 25 2020    Past Surgical History:  Procedure Laterality Date   CESAREAN SECTION N/A 05/20/2020   Procedure: CESAREAN SECTION;  Surgeon: Tereso Newcomer, MD;  Location: [facility] LD ORS;  Service: Obstetrics;  Laterality: N/A;   CESAREAN SECTION N/A 12/23/2021   Procedure: CESAREAN SECTION;  Surgeon: Kathrynn Running, MD;  Location: [facility]S;  Service: Obstetrics;  Laterality: N/A;   DILATION AND CURETTAGE OF UTERUS     abortion   DILATION AND EVACUATION N/A 10/04/2017   Procedure: DILATATION AND EVACUATION;  Surgeon: Silverio Lay, MD;  Location: [facility] ORS;  Service: Gynecology;  Laterality: N/A;   DILATION AND EVACUATION N/A 12/23/2020   Procedure: DILATATION AND EVACUATION;  Surgeon: Adam Phenix, MD;  Location: [facility] OR;  Service: Gynecology;  Laterality: N/A;   OPERATIVE ULTRASOUND N/A 12/23/2020   Procedure: OPERATIVE ULTRASOUND;  Surgeon: Adam Phenix, MD;  Location: [facility] OR;  Service: Gynecology;  Laterality: N/A;    OB History  Gravida Para Term Preterm AB  Living  10 3 3  0 6 3  SAB IAB Ectopic Multiple Live Births  5 1 0 0 3    # Outcome Date GA Lbr Len/2nd Weight Sex Type Anes PTL Lv  10 Current           9 Term 12/23/21 [redacted]w[redacted]d  6 lb 0.3 oz (2.73 kg) M CS-LTranv Spinal  LIV  8 SAB 2022          7 Term 05/20/20 [redacted]w[redacted]d  6 lb 6.3 oz (2.9 kg) M CS-LTranv Spinal  LIV  6 SAB 2019     SAB     5 Term 12/27/15 [redacted]w[redacted]d 21:31 / 00:42 8 lb 6 oz (3.799 kg) M Vag-Spont EPI  LIV  4 SAB              Birth Comments: System Generated. Please review and update pregnancy details.  3 SAB           2 SAB           1 IAB             Social History   Socioeconomic History   Marital status: Single    Spouse name: Not on file   Number of children: 3   Years of education: 12   Highest education level: Not on file  Occupational History   Occupation: [employer]  Tobacco Use   Smoking status: Every Day    Current packs/day: 0.50  Average packs/day: 0.5 packs/day for 5.0 years (2.5 ttl pk-yrs)    Types: Cigarettes    Passive exposure: Past   Smokeless tobacco: Never  Vaping Use   Vaping status: Never Used  Substance and Sexual Activity   Alcohol use: Yes    Comment: occasional, not while preg   Drug use: Yes    Types: Marijuana    Comment: Occasional 3 times per month   Sexual activity: Yes    Partners: Male    Birth control/protection: None  Other Topics Concern   Not on file  Social History Narrative   Patient is single and lives at home alone. Patient works at [employer]. Right handed. Caffeine three daily.   Social Determinants of Health   Financial Resource Strain: Not on file  Food Insecurity: Not on file  Transportation Needs: Not on file  Physical Activity: Not on file  Stress: Not on file  Social Connections: Not on file    Family History  Problem Relation Age of Onset   Diabetes Mother    Other Father        Hx unknown   Asthma Brother    Colon cancer Neg Hx    Esophageal cancer Neg Hx    Rectal cancer Neg Hx       Current Outpatient Medications:    aspirin EC 81 MG tablet, Take 1 tablet (81 mg total) by mouth daily. Swallow whole., Disp: 30 tablet, Rfl: 12   diphenhydramine-acetaminophen (TYLENOL PM) 25-500 MG TABS tablet, Take 1 tablet by mouth at bedtime as needed., Disp: , Rfl:    levETIRAcetam (KEPPRA) 1000 MG tablet, Take 1 tablet (1,000 mg total) by mouth 2 (two) times daily., Disp: 180 tablet, Rfl: 4  No Known Allergies  Review of Systems: Negative except for what is mentioned in HPI.  Objective:   Vitals:   03/16/23 1007  BP: 98/64  Pulse: 77  Weight: 226 lb (102.5 kg)    Fetal Status: Fetal Heart Rate (bpm): 155lb         Physical Exam: BP 98/64   Pulse 77   Wt 226 lb (102.5 kg)   LMP 12/20/2022 (Exact Date)   BMI 38.79 kg/m  CONSTITUTIONAL: Well-developed, well-nourished female in no acute distress.  NEUROLOGIC: Alert and oriented to person, place, and time. Normal reflexes, muscle tone coordination. No cranial nerve deficit noted. PSYCHIATRIC: Normal mood and affect. Normal behavior. Normal judgment and thought content. SKIN: Skin is warm and dry. No rash noted. Not diaphoretic. No erythema. No pallor. HENT:  Normocephalic, atraumatic, External right and left ear normal. Oropharynx is clear and moist EYES: Conjunctivae and EOM are normal. Pupils are equal, round, and reactive to light. No scleral icterus.  NECK: Normal range of motion, supple, no masses CARDIOVASCULAR: Normal heart rate noted, regular rhythm RESPIRATORY: Effort and breath sounds normal, no problems with respiration noted BREASTS: deferred ABDOMEN: Soft, nontender, nondistended, gravid. GU: normal appearing external female genitalia, multiparous normal appearing cervix, scant white discharge in vagina, no lesions noted Bimanual: 12 weeks sized uterus, no adnexal tenderness or palpable lesions noted MUSCULOSKELETAL: Normal range of motion. EXT:  No edema and no tenderness. 2+ distal  pulses.   Assessment and Plan:  Pregnancy: [id number] at [redacted]w[redacted]d by LMP  1. 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  - Cytology - PAP( [place]) - Cervicovaginal ancillary only( ) - CBC/D/Plt+RPR+Rh+ABO+RubIgG... - Culture, OB  Urine - HgB A1c - [place] MFM OB DETAIL +14 WK; Future - TSH  2. Seizure disorder in pregnancy, antepartum (HCC) - Stable on current dose, 1000 mg BID - Sees neuro in November, has seizure 1/year - states they did not want to change her dose with last pregnancy - keep Nov appt, will be seen sooner if needed - Comprehensive metabolic panel - reviewed options for contraception, she is ineterested in IUD  3. Previous cesarean delivery affecting pregnancy Will need repeat  4. History of molar pregnancy 2022  5. Obesity during pregnancy, antepartum Start baby aspirin  6. Supervision of high risk pregnancy, antepartum  7. H/O: C-section Needs repeat  8. H/O placenta previa Reviewed higher risk for accreta spectrum and need for regular care  9. Cervical cancer screening - Cytology - PAP( [place])  10. Supervision of elderly primigravida, first trimester - PANORAMA PRENATAL TEST  11. [redacted] weeks gestation of pregnancy    labor symptoms and general obstetric precautions including but not limited to vaginal bleeding, contractions, leaking of fluid and fetal movement were reviewed in detail with the patient.  Please refer to After Visit Summary for other counseling recommendations.   Return in about 4 weeks (around 04/13/2023) for high OB.  Conan Bowens 03/16/2023 12:10 PM

## 2023-03-17 LAB — CBC/D/PLT+RPR+RH+ABO+RUBIGG...
Antibody Screen: NEGATIVE
Basophils Absolute: 0 10*3/uL (ref 0.0–0.2)
Basos: 0 %
EOS (ABSOLUTE): 0.1 10*3/uL (ref 0.0–0.4)
Eos: 1 %
HCV Ab: NONREACTIVE
HIV Screen 4th Generation wRfx: NONREACTIVE
Hematocrit: 34.5 % (ref 34.0–46.6)
Hemoglobin: 11.4 g/dL (ref 11.1–15.9)
Hepatitis B Surface Ag: NEGATIVE
Immature Grans (Abs): 0 10*3/uL (ref 0.0–0.1)
Immature Granulocytes: 0 %
Lymphocytes Absolute: 1.5 10*3/uL (ref 0.7–3.1)
Lymphs: 23 %
MCH: 30.5 pg (ref 26.6–33.0)
MCHC: 33 g/dL (ref 31.5–35.7)
MCV: 92 fL (ref 79–97)
Monocytes Absolute: 0.3 10*3/uL (ref 0.1–0.9)
Monocytes: 5 %
Neutrophils Absolute: 4.5 10*3/uL (ref 1.4–7.0)
Neutrophils: 71 %
Platelets: 257 10*3/uL (ref 150–450)
RBC: 3.74 x10E6/uL — ABNORMAL LOW (ref 3.77–5.28)
RDW: 12.8 % (ref 11.7–15.4)
RPR Ser Ql: NONREACTIVE
Rh Factor: POSITIVE
Rubella Antibodies, IGG: 4.16 {index} (ref >0.99)
WBC: 6.4 10*3/uL (ref 3.4–10.8)

## 2023-03-17 LAB — HCV INTERPRETATION

## 2023-03-17 LAB — CYTOLOGY - PAP
Comment: NEGATIVE
Diagnosis: NEGATIVE
High risk HPV: NEGATIVE

## 2023-03-17 LAB — COMPREHENSIVE METABOLIC PANEL
ALT: 8 IU/L (ref 0–32)
AST: 9 IU/L (ref 0–40)
Albumin: 3.8 g/dL — ABNORMAL LOW (ref 3.9–4.9)
Alkaline Phosphatase: 84 IU/L (ref 44–121)
BUN/Creatinine Ratio: 10 (ref 9–23)
BUN: 5 mg/dL — ABNORMAL LOW (ref 6–20)
Bilirubin Total: 0.3 mg/dL (ref 0.0–1.2)
CO2: 23 mmol/L (ref 20–29)
Calcium: 9.2 mg/dL (ref 8.7–10.2)
Chloride: 105 mmol/L (ref 96–106)
Creatinine, Ser: 0.52 mg/dL — ABNORMAL LOW (ref 0.57–1.00)
Globulin, Total: 2.6 g/dL (ref 1.5–4.5)
Glucose: 90 mg/dL (ref 70–99)
Potassium: 4.2 mmol/L (ref 3.5–5.2)
Sodium: 139 mmol/L (ref 134–144)
Total Protein: 6.4 g/dL (ref 6.0–8.5)
eGFR: 127 mL/min/{1.73_m2} (ref >59)

## 2023-03-17 LAB — HEMOGLOBIN A1C
Est. average glucose Bld gHb Est-mCnc: 105 mg/dL
Hgb A1c MFr Bld: 5.3 % (ref 4.8–5.6)

## 2023-03-17 LAB — CERVICOVAGINAL ANCILLARY ONLY
Chlamydia: NEGATIVE
Comment: NEGATIVE
Comment: NEGATIVE
Comment: NORMAL
Neisseria Gonorrhea: NEGATIVE
Trichomonas: NEGATIVE

## 2023-03-17 LAB — TSH: TSH: 0.462 u[IU]/mL (ref 0.450–4.500)

## 2023-03-18 LAB — CULTURE, OB URINE

## 2023-03-18 LAB — URINE CULTURE, OB REFLEX

## 2023-03-24 LAB — PANORAMA PRENATAL TEST FULL PANEL:PANORAMA TEST PLUS 5 ADDITIONAL MICRODELETIONS: FETAL FRACTION: 14.8

## 2023-04-13 ENCOUNTER — Ambulatory Visit (INDEPENDENT_AMBULATORY_CARE_PROVIDER_SITE_OTHER): Payer: Medicaid Other | Admitting: Obstetrics and Gynecology

## 2023-04-13 ENCOUNTER — Encounter: Payer: Self-pay | Admitting: Obstetrics and Gynecology

## 2023-04-13 VITALS — BP 91/60 | HR 66 | Wt 229.0 lb

## 2023-04-13 DIAGNOSIS — O34219 Maternal care for unspecified type scar from previous cesarean delivery: Secondary | ICD-10-CM

## 2023-04-13 DIAGNOSIS — O99210 Obesity complicating pregnancy, unspecified trimester: Secondary | ICD-10-CM

## 2023-04-13 DIAGNOSIS — G40909 Epilepsy, unspecified, not intractable, without status epilepticus: Secondary | ICD-10-CM

## 2023-04-13 DIAGNOSIS — O99350 Diseases of the nervous system complicating pregnancy, unspecified trimester: Secondary | ICD-10-CM

## 2023-04-13 DIAGNOSIS — O0990 Supervision of high risk pregnancy, unspecified, unspecified trimester: Secondary | ICD-10-CM

## 2023-04-13 NOTE — Progress Notes (Signed)
   PRENATAL VISIT NOTE  Subjective:  Brittany Hendrix is a 32 y.o. [phone] at [redacted]w[redacted]d being seen today for ongoing prenatal care.  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  Patient reports no complaints.  Contractions: Not present. Vag. Bleeding: None.   . Denies leaking of fluid.   The following portions of the patient's history were reviewed and updated as appropriate: allergies, current medications, past family history, past medical history, past social history, past surgical history and problem list.   Objective:   Vitals:   04/13/23 1134 04/13/23 1138  BP: (!) 86/55 91/60  Pulse: 61 66  Weight: 229 lb (103.9 kg)     Fetal Status: Fetal Heart Rate (bpm): 156         General:  Alert, oriented and cooperative. Patient is in no acute distress.  Skin: Skin is warm and dry. No rash noted.   Cardiovascular: Normal heart rate noted  Respiratory: Normal respiratory effort, no problems with respiration noted  Abdomen: Soft, gravid, appropriate for gestational age.  Pain/Pressure: Absent     Pelvic: Cervical exam deferred        Extremities: Normal range of motion.  Edema: None  Mental Status: Normal mood and affect. Normal behavior. Normal judgment and thought content.   Assessment and Plan:  Pregnancy: [id number] at [redacted]w[redacted]d 1. Supervision of high risk pregnancy, antepartum Patient is doing well without complaints Anatomy ultrasound ordered Patient opted to defer AFP until next visit  2. Previous cesarean  delivery affecting pregnancy Patient plans for a repeat c-section Plans IUD or pills for contraception  3. Seizure disorder in pregnancy, antepartum (HCC) Stable on Keppra Needs to reschedule her Neurology appointment  4. Obesity during pregnancy, antepartum Continue ASA  Preterm labor symptoms and general obstetric precautions including but not limited to vaginal bleeding, contractions, leaking of fluid and fetal movement were reviewed in detail with the patient. Please refer to After Visit Summary for other counseling recommendations.   Return in about 4 weeks (around 05/11/2023) for in person, ROB, High risk.  Future Appointments  Date Time Provider Department Center  05/02/2023 12:15 PM [facility] NURSE [facility]  05/02/2023 12:30 PM [facility] US2 [facility]  05/12/2023  9:55 AM Raelyn Mora, CNM [facility] None  06/08/2023  9:55 AM Gerrit Heck, CNM [facility] None    Catalina Antigua, MD

## 2023-05-02 ENCOUNTER — Ambulatory Visit: Payer: Medicaid Other | Attending: Obstetrics and Gynecology

## 2023-05-02 ENCOUNTER — Ambulatory Visit: Payer: Medicaid Other | Admitting: *Deleted

## 2023-05-02 ENCOUNTER — Encounter: Payer: Self-pay | Admitting: *Deleted

## 2023-05-02 ENCOUNTER — Other Ambulatory Visit: Payer: Self-pay | Admitting: *Deleted

## 2023-05-02 VITALS — BP 105/58 | HR 60

## 2023-05-02 DIAGNOSIS — O2622 Pregnancy care for patient with recurrent pregnancy loss, second trimester: Secondary | ICD-10-CM | POA: Diagnosis not present

## 2023-05-02 DIAGNOSIS — O0990 Supervision of high risk pregnancy, unspecified, unspecified trimester: Secondary | ICD-10-CM | POA: Diagnosis present

## 2023-05-02 DIAGNOSIS — O99353 Diseases of the nervous system complicating pregnancy, third trimester: Secondary | ICD-10-CM

## 2023-05-02 DIAGNOSIS — G40909 Epilepsy, unspecified, not intractable, without status epilepticus: Secondary | ICD-10-CM

## 2023-05-02 DIAGNOSIS — O09292 Supervision of pregnancy with other poor reproductive or obstetric history, second trimester: Secondary | ICD-10-CM

## 2023-05-02 DIAGNOSIS — O99212 Obesity complicating pregnancy, second trimester: Secondary | ICD-10-CM

## 2023-05-02 DIAGNOSIS — O2620 Pregnancy care for patient with recurrent pregnancy loss, unspecified trimester: Secondary | ICD-10-CM | POA: Diagnosis present

## 2023-05-02 DIAGNOSIS — O0942 Supervision of pregnancy with grand multiparity, second trimester: Secondary | ICD-10-CM

## 2023-05-02 DIAGNOSIS — E669 Obesity, unspecified: Secondary | ICD-10-CM

## 2023-05-02 DIAGNOSIS — Z3A19 19 weeks gestation of pregnancy: Secondary | ICD-10-CM

## 2023-05-02 DIAGNOSIS — O34219 Maternal care for unspecified type scar from previous cesarean delivery: Secondary | ICD-10-CM

## 2023-05-12 ENCOUNTER — Ambulatory Visit (INDEPENDENT_AMBULATORY_CARE_PROVIDER_SITE_OTHER): Payer: Medicaid Other | Admitting: Obstetrics and Gynecology

## 2023-05-12 ENCOUNTER — Encounter: Payer: Self-pay | Admitting: Obstetrics and Gynecology

## 2023-05-12 VITALS — BP 95/65 | HR 82 | Wt 229.2 lb

## 2023-05-12 DIAGNOSIS — O34219 Maternal care for unspecified type scar from previous cesarean delivery: Secondary | ICD-10-CM

## 2023-05-12 DIAGNOSIS — Z3A20 20 weeks gestation of pregnancy: Secondary | ICD-10-CM

## 2023-05-12 DIAGNOSIS — O99350 Diseases of the nervous system complicating pregnancy, unspecified trimester: Secondary | ICD-10-CM

## 2023-05-12 DIAGNOSIS — G40909 Epilepsy, unspecified, not intractable, without status epilepticus: Secondary | ICD-10-CM

## 2023-05-12 DIAGNOSIS — O0990 Supervision of high risk pregnancy, unspecified, unspecified trimester: Secondary | ICD-10-CM

## 2023-05-12 DIAGNOSIS — O99210 Obesity complicating pregnancy, unspecified trimester: Secondary | ICD-10-CM

## 2023-05-12 MED ORDER — ASPIRIN 81 MG PO TBEC
81.0000 mg | DELAYED_RELEASE_TABLET | Freq: Every day | ORAL | 12 refills | Status: DC
Start: 2023-05-12 — End: 2023-09-15

## 2023-05-12 NOTE — Progress Notes (Signed)
Pt presents for ROB visit. Pt c/o pelvic pain. Declines AFP testing.

## 2023-05-12 NOTE — Progress Notes (Signed)
HIGH-RISK PREGNANCY OFFICE VISIT Patient name: Brittany Hendrix MRN [id number]  Date of birth: Apr 06, 1991 Chief Complaint:   Routine Prenatal Visit  History of Present Illness:   Brittany Hendrix is a 32 y.o. [id number] female at [redacted]w[redacted]d with an Estimated Date of Delivery: 09/26/23 being seen today for ongoing management of a high-risk pregnancy complicated by  seizure d/o (on Keppra), obesity, and previous cesarean delivery Today she reports no complaints. Contractions: Not present. Vag. Bleeding: None.  Movement: Present. denies leaking of fluid.  Review of Systems:   Pertinent items are noted in HPI Denies abnormal vaginal discharge w/ itching/odor/irritation, headaches, visual changes, shortness of breath, chest pain, abdominal pain, severe nausea/vomiting, or problems with urination or bowel movements unless otherwise stated above. Pertinent History Reviewed:  Reviewed past medical,surgical, social, obstetrical and family history.  Reviewed problem list, medications and allergies. Physical Assessment:   Vitals:   05/12/23 1005  BP: 95/65  Pulse: 82  Weight: 229 lb 3.2 oz (104 kg)  Body mass index is 39.34 kg/m.           Physical Examination:   General appearance: alert, well appearing, and in no distress, oriented to person, place, and time, and overweight  Mental status: alert, oriented to person, place, and time, normal mood, behavior, speech, dress, motor activity, and thought processes  Skin: warm & dry   Extremities: Edema: None    Cardiovascular: normal heart rate noted  Respiratory: normal respiratory effort, no distress  Abdomen: gravid, soft, non-tender  Pelvic: Cervical exam deferred         Fetal Status: Fetal Heart Rate (bpm): 141 Fundal Height: 17 cm Movement: Present    Fetal Surveillance Testing today: none   No results found for this or any previous visit (from the past 24 hour(s)).  Assessment & Plan:  1) High-risk pregnancy [id number] at [redacted]w[redacted]d with an  Estimated Date of Delivery: 09/26/23   2) Supervision of high risk pregnancy, antepartum - Rx: aspirin EC 81 MG tablet; Take 1 tablet (81 mg total) by mouth daily. Swallow whole.  Dispense: 30 tablet; Refill: 12  3) Previous cesarean delivery affecting pregnancy  4) Seizure disorder in pregnancy, antepartum (HCC) - Managed by [facility] Neurology - next appt was 11/22, but she states they have to reschedule that "because the provider is going to be out of the office  5) Obesity during pregnancy, antepartum - Continue daily bASA  6) [redacted] weeks gestation of pregnancy    Meds:  Meds ordered this encounter  Medications   aspirin EC 81 MG tablet    Sig: Take 1 tablet (81 mg total) by mouth daily. Swallow whole.    Dispense:  30 tablet    Refill:  12    Labs/procedures today: none  Treatment Plan:    Reviewed: Preterm labor symptoms and general obstetric precautions including but not limited to vaginal bleeding, contractions, leaking of fluid and fetal movement were reviewed in detail with the patient.  All questions were answered. Has home bp cuff. Check bp weekly, let us know if >140/90.   Follow-up: Return in about 4 weeks (around 06/09/2023) for Return OB visit.  No orders of the defined types were placed in this encounter.  Raelyn Mora MSN, CNM 05/12/2023 12:07 PM

## 2023-05-17 ENCOUNTER — Encounter: Payer: Self-pay | Admitting: Advanced Practice Midwife

## 2023-06-06 ENCOUNTER — Other Ambulatory Visit: Payer: Self-pay

## 2023-06-06 ENCOUNTER — Other Ambulatory Visit: Payer: Self-pay | Admitting: *Deleted

## 2023-06-06 ENCOUNTER — Encounter: Payer: Medicaid Other | Admitting: Advanced Practice Midwife

## 2023-06-06 ENCOUNTER — Ambulatory Visit: Payer: Medicaid Other | Attending: Maternal & Fetal Medicine

## 2023-06-06 DIAGNOSIS — O0942 Supervision of pregnancy with grand multiparity, second trimester: Secondary | ICD-10-CM | POA: Diagnosis not present

## 2023-06-06 DIAGNOSIS — O99212 Obesity complicating pregnancy, second trimester: Secondary | ICD-10-CM

## 2023-06-06 DIAGNOSIS — E669 Obesity, unspecified: Secondary | ICD-10-CM

## 2023-06-06 DIAGNOSIS — O09292 Supervision of pregnancy with other poor reproductive or obstetric history, second trimester: Secondary | ICD-10-CM

## 2023-06-06 DIAGNOSIS — O2622 Pregnancy care for patient with recurrent pregnancy loss, second trimester: Secondary | ICD-10-CM

## 2023-06-06 DIAGNOSIS — G40909 Epilepsy, unspecified, not intractable, without status epilepticus: Secondary | ICD-10-CM

## 2023-06-06 DIAGNOSIS — O99352 Diseases of the nervous system complicating pregnancy, second trimester: Secondary | ICD-10-CM

## 2023-06-06 DIAGNOSIS — O34219 Maternal care for unspecified type scar from previous cesarean delivery: Secondary | ICD-10-CM

## 2023-06-06 DIAGNOSIS — Z3A24 24 weeks gestation of pregnancy: Secondary | ICD-10-CM

## 2023-06-08 ENCOUNTER — Encounter: Payer: Medicaid Other | Admitting: Obstetrics and Gynecology

## 2023-06-21 ENCOUNTER — Telehealth: Payer: Medicaid Other | Admitting: Neurology

## 2023-06-27 ENCOUNTER — Other Ambulatory Visit: Payer: Self-pay

## 2023-06-27 ENCOUNTER — Emergency Department
Admission: EM | Admit: 2023-06-27 | Discharge: 2023-06-27 | Disposition: A | Discharge status: Home / Self Care | Payer: Medicaid Other | Attending: Emergency Medicine | Admitting: Emergency Medicine

## 2023-06-27 DIAGNOSIS — Z3A27 27 weeks gestation of pregnancy: Secondary | ICD-10-CM | POA: Diagnosis not present

## 2023-06-27 DIAGNOSIS — O2242 Hemorrhoids in pregnancy, second trimester: Secondary | ICD-10-CM | POA: Diagnosis present

## 2023-06-27 DIAGNOSIS — K644 Residual hemorrhoidal skin tags: Secondary | ICD-10-CM

## 2023-06-27 LAB — CBC WITH DIFFERENTIAL/PLATELET
Abs Immature Granulocytes: 0.02 10*3/uL (ref 0.00–0.07)
Basophils Absolute: 0 10*3/uL (ref 0.0–0.1)
Basophils Relative: 0 %
Eosinophils Absolute: 0 10*3/uL (ref 0.0–0.5)
Eosinophils Relative: 0 %
HCT: 32 % — ABNORMAL LOW (ref 36.0–46.0)
Hemoglobin: 10.9 g/dL — ABNORMAL LOW (ref 12.0–15.0)
Immature Granulocytes: 0 %
Lymphocytes Relative: 22 %
Lymphs Abs: 1.7 10*3/uL (ref 0.7–4.0)
MCH: 31.7 pg (ref 26.0–34.0)
MCHC: 34.1 g/dL (ref 30.0–36.0)
MCV: 93 fL (ref 80.0–100.0)
Monocytes Absolute: 0.4 10*3/uL (ref 0.1–1.0)
Monocytes Relative: 5 %
Neutro Abs: 5.5 10*3/uL (ref 1.7–7.7)
Neutrophils Relative %: 73 %
Platelets: 227 10*3/uL (ref 150–400)
RBC: 3.44 MIL/uL — ABNORMAL LOW (ref 3.87–5.11)
RDW: 12.2 % (ref 11.5–15.5)
WBC: 7.6 10*3/uL (ref 4.0–10.5)
nRBC: 0 % (ref 0.0–0.2)

## 2023-06-27 LAB — COMPREHENSIVE METABOLIC PANEL
ALT: 28 U/L (ref 0–44)
AST: 23 U/L (ref 15–41)
Albumin: 3.1 g/dL — ABNORMAL LOW (ref 3.5–5.0)
Alkaline Phosphatase: 105 U/L (ref 38–126)
Anion gap: 8 (ref 5–15)
BUN: 5 mg/dL — ABNORMAL LOW (ref 6–20)
CO2: 20 mmol/L — ABNORMAL LOW (ref 22–32)
Calcium: 8.1 mg/dL — ABNORMAL LOW (ref 8.9–10.3)
Chloride: 104 mmol/L (ref 98–111)
Creatinine, Ser: 0.43 mg/dL — ABNORMAL LOW (ref 0.44–1.00)
GFR, Estimated: >60 mL/min (ref >60)
Glucose, Bld: 105 mg/dL — ABNORMAL HIGH (ref 70–99)
Potassium: 3.3 mmol/L — ABNORMAL LOW (ref 3.5–5.1)
Sodium: 132 mmol/L — ABNORMAL LOW (ref 135–145)
Total Bilirubin: 0.5 mg/dL (ref <1.2)
Total Protein: 6.7 g/dL (ref 6.5–8.1)

## 2023-06-27 LAB — URINALYSIS, ROUTINE W REFLEX MICROSCOPIC
Bilirubin Urine: NEGATIVE
Glucose, UA: NEGATIVE mg/dL
Hgb urine dipstick: NEGATIVE
Ketones, ur: 80 mg/dL — AB
Leukocytes,Ua: NEGATIVE
Nitrite: NEGATIVE
Protein, ur: 30 mg/dL — AB
Specific Gravity, Urine: 1.03 (ref 1.005–1.030)
pH: 6 (ref 5.0–8.0)

## 2023-06-27 LAB — HCG, QUANTITATIVE, PREGNANCY: hCG, Beta Chain, Quant, S: 26275 m[IU]/mL — ABNORMAL HIGH (ref <5)

## 2023-06-27 MED ORDER — LIDOCAINE VISCOUS HCL 2 % MT SOLN
15.0000 mL | Freq: Once | OROMUCOSAL | Status: AC
  Administered 2023-06-27: 15 mL via TOPICAL
  Filled 2023-06-27: qty 15

## 2023-06-27 NOTE — ED Triage Notes (Signed)
First nurse note: pt reports pain to rectum from hemorrhoids. Denies contractions, vaginal bleeding. [redacted] wks pregnant.

## 2023-06-27 NOTE — Discharge Instructions (Signed)
At home, you can do sitz bath's to help with pain symptoms.  Also recommend use of things like MiraLAX over-the-counter and increase fiber in your diet to help have frequent softer stools which will help.  Please follow-up with your primary care provider for reassessment as needed.

## 2023-06-27 NOTE — ED Triage Notes (Signed)
Pt arrives to ED. C/o pelvic pain as well as rectal pain and bleeding. Reports concern for a bleeding hemorrhoid. Pt also reports that the pelvic pain is sharp and shooting and somewhat similar to contractions, but states she is unsure if originates from rectum. Pt states she is [redacted] wks pregnant. Pt also reports congestion with negative covid test at home. Pt is alert and oriented. Breathing unlabored speaking in full sentences. Symmetric chest rise and fall.

## 2023-06-27 NOTE — ED Notes (Signed)
Spoke with L&D regarding whether or not to send pt upstairs for evaluation. Nursing staff on unit deferred initial care and assessment of pt to ED due to chief complaint.

## 2023-06-27 NOTE — ED Provider Notes (Signed)
[facility] Provider Note    Event Date/Time   First MD Initiated Contact with Patient 06/27/23 2012     (approximate)   History   Rectal Bleeding and Pelvic Pain   HPI Brittany Hendrix is a 32 y.o. female with history of seizures in pregnancy, currently [redacted] weeks pregnant presenting today for rectal pain and bleeding concerning for possible hemorrhoid.  Patient states she has noted some pain when wiping with blood noticed.  She does have some pain during bowel movements.  Has noted she has been constipated recently is concerned that she might have a hemorrhoid.  Has a prior history of anal fissures.  Otherwise denies any abdominal pain symptoms.  No nausea or vomiting.  No melanotic stools.  Reviewed most recent chart notes.     Physical Exam   Triage Vital Signs: ED Triage Vitals  Encounter Vitals Group     BP 06/27/23 1901 97/60     Systolic BP Percentile --      Diastolic BP Percentile --      Pulse Rate 06/27/23 1901 81     Resp 06/27/23 1901 18     Temp 06/27/23 1901 98.5 F (36.9 C)     Temp Source 06/27/23 1901 Oral     SpO2 06/27/23 1901 96 %     Weight --      Height --      Head Circumference --      Peak Flow --      Pain Score 06/27/23 1904 10     Pain Loc --      Pain Education --      Exclude from Growth Chart --     Most recent vital signs: Vitals:   06/27/23 1901  BP: 97/60  Pulse: 81  Resp: 18  Temp: 98.5 F (36.9 C)  SpO2: 96%   Physical Exam: I have reviewed the vital signs and nursing notes. General: Awake, alert, no acute distress.  Nontoxic appearing. Head:  Atraumatic, normocephalic.   ENT:  EOM intact, PERRL. Oral mucosa is pink and moist with no lesions. Neck: Neck is supple with full range of motion, No meningeal signs. Cardiovascular:  RRR, No murmurs. Peripheral pulses palpable and equal bilaterally. Respiratory:  Symmetrical chest wall expansion.  No rhonchi, rales, or wheezes.  Good air movement  throughout.  No use of accessory muscles.   Musculoskeletal:  No cyanosis or edema. Moving extremities with full ROM Abdomen:  Soft, nontender, gravid abdomen. Rectal: External hemorrhoid present but no other signs of bleeding or anal fissure Neuro:  GCS 15, moving all four extremities, interacting appropriately. Speech clear. Psych:  Calm, appropriate.   Skin:  Warm, dry, no rash.     ED Results / Procedures / Treatments   Labs (all labs ordered are listed, but only abnormal results are displayed) Labs Reviewed  CBC WITH DIFFERENTIAL/PLATELET - Abnormal; Notable for the following components:      Result Value   RBC 3.44 (*)    Hemoglobin 10.9 (*)    HCT 32.0 (*)    All other components within normal limits  COMPREHENSIVE METABOLIC PANEL - Abnormal; Notable for the following components:   Sodium 132 (*)    Potassium 3.3 (*)    CO2 20 (*)    Glucose, Bld 105 (*)    BUN 5 (*)    Creatinine, Ser 0.43 (*)    Calcium 8.1 (*)    Albumin 3.1 (*)  All other components within normal limits  HCG, QUANTITATIVE, PREGNANCY - Abnormal; Notable for the following components:   hCG, Beta Chain, Quant, S 26,275 (*)    All other components within normal limits  URINALYSIS, ROUTINE W REFLEX MICROSCOPIC - Abnormal; Notable for the following components:   Color, Urine AMBER (*)    APPearance HAZY (*)    Ketones, ur 80 (*)    Protein, ur 30 (*)    Bacteria, UA RARE (*)    All other components within normal limits     EKG    RADIOLOGY    PROCEDURES:  Critical Care performed: No  Procedures   MEDICATIONS ORDERED IN ED: Medications  lidocaine (XYLOCAINE) 2 % viscous mouth solution 15 mL (15 mLs Topical Given 06/27/23 2208)     IMPRESSION / MDM / ASSESSMENT AND PLAN / ED COURSE  I reviewed the triage vital signs and the nursing notes.                              Differential diagnosis includes, but is not limited to, external hemorrhoid, internal hemorrhoid, anal  fissure  Patient's presentation is most consistent with acute complicated illness / injury requiring diagnostic workup.  Patient is a 32 year old female presenting today for rectal pain with bright red blood when wiping concerning for hemorrhoid.  With me, she denies any abdominal pain complaints at all.  No other concerns related to her pregnancy.  No vaginal bleeding.  UA with no evidence of UTI.  CBC shows relatively stable hemoglobin with her baseline.  CMP with likely some mild dehydration but no other concerning findings.  Rectal exam performed at the bedside does show evidence of external hemorrhoid that does not appear thrombosed.  No evidence of anal fissure or other ongoing bleeding at this time.  Patient otherwise comfortable and safe for discharge with outpatient management of external hemorrhoid.  No indication for incision and drainage.  Told to follow-up with PCP and given strict return precautions.  The patient is on the cardiac monitor to evaluate for evidence of arrhythmia and/or significant heart rate changes. Clinical Course as of 06/27/23 2346  Tue Jun 27, 2023  2012 Hemoglobin(!): 10.9 Largely comparable to baseline and likely consistent with slight drop from pregnancy. [DW]  2151 Urinalysis, Routine w reflex microscopic -Urine, Clean Catch(!) No UTI [DW]    Clinical Course User Index [DW] Janith Lima, MD     FINAL CLINICAL IMPRESSION(S) / ED DIAGNOSES   Final diagnoses:  External hemorrhoid     Rx / DC Orders   ED Discharge Orders     None        Note:  This document was prepared using Dragon voice recognition software and may include unintentional dictation errors.   Janith Lima, MD 06/27/23 [phone]

## 2023-07-04 ENCOUNTER — Other Ambulatory Visit: Payer: Self-pay | Admitting: *Deleted

## 2023-07-04 ENCOUNTER — Ambulatory Visit: Payer: Medicaid Other | Attending: Obstetrics and Gynecology

## 2023-07-04 ENCOUNTER — Telehealth: Payer: Self-pay | Admitting: Neurology

## 2023-07-04 DIAGNOSIS — E669 Obesity, unspecified: Secondary | ICD-10-CM | POA: Diagnosis not present

## 2023-07-04 DIAGNOSIS — O99213 Obesity complicating pregnancy, third trimester: Secondary | ICD-10-CM

## 2023-07-04 DIAGNOSIS — Z3A28 28 weeks gestation of pregnancy: Secondary | ICD-10-CM

## 2023-07-04 DIAGNOSIS — G40909 Epilepsy, unspecified, not intractable, without status epilepticus: Secondary | ICD-10-CM

## 2023-07-04 DIAGNOSIS — O0943 Supervision of pregnancy with grand multiparity, third trimester: Secondary | ICD-10-CM

## 2023-07-04 DIAGNOSIS — O99353 Diseases of the nervous system complicating pregnancy, third trimester: Secondary | ICD-10-CM | POA: Diagnosis not present

## 2023-07-04 DIAGNOSIS — O2623 Pregnancy care for patient with recurrent pregnancy loss, third trimester: Secondary | ICD-10-CM

## 2023-07-04 DIAGNOSIS — O99212 Obesity complicating pregnancy, second trimester: Secondary | ICD-10-CM | POA: Diagnosis present

## 2023-07-04 DIAGNOSIS — O09293 Supervision of pregnancy with other poor reproductive or obstetric history, third trimester: Secondary | ICD-10-CM

## 2023-07-04 NOTE — Telephone Encounter (Signed)
..   Pt understands that although there may be some limitations with this type of visit, we will take all precautions to reduce any security or privacy concerns.  Pt understands that this will be treated like an in office visit and we will file with pt's insurance, and there may be a patient responsible charge related to this service. ? ?

## 2023-07-06 ENCOUNTER — Telehealth: Payer: Self-pay | Admitting: Neurology

## 2023-07-06 DIAGNOSIS — G40909 Epilepsy, unspecified, not intractable, without status epilepticus: Secondary | ICD-10-CM

## 2023-07-06 NOTE — Telephone Encounter (Signed)
 Pt has returned the call to [place], CMA

## 2023-07-06 NOTE — Telephone Encounter (Signed)
Pt has returned call to [facility]

## 2023-07-06 NOTE — Telephone Encounter (Signed)
 Lmtrc 1st attempt

## 2023-07-06 NOTE — Telephone Encounter (Signed)
Call to patient, last visit note states patient was on birth control and no plans of getting pregnant. Patient is now pregnant, due date 09/26/23. Has been diligent with Keppra compliance and no concerns. Refill request pended

## 2023-07-06 NOTE — Telephone Encounter (Signed)
Returned call to pt and attempted to schedule her earlier and she stated that she has frequent car trouble and would like to keep original appt. I aslo informed her that the refill was sent as requested. Pt voiced gratitude and understanding of all discussed

## 2023-07-06 NOTE — Telephone Encounter (Signed)
Chart reviewed, patient requesting refill on Keppra, is apparently pregnant.  Seems to be around [redacted] weeks pregnant.  Recommend continue Keppra 1000 mg twice daily.  Recommend at least 1 mg folic acid supplementation daily.  Also would recommend baseline Keppra level, can be done at her OB.  Previously delivered a baby in 2023 while on Keppra.  Currently scheduled for video visit with me in February, we will see if we can move this up.  Call for any seizures.  Meds ordered this encounter  Medications   levETIRAcetam (KEPPRA) 1000 MG tablet    Sig: Take 1 tablet by mouth twice daily    Dispense:  120 tablet    Refill:  0   Orders Placed This Encounter  Procedures   Levetiracetam level

## 2023-07-24 ENCOUNTER — Ambulatory Visit (INDEPENDENT_AMBULATORY_CARE_PROVIDER_SITE_OTHER): Payer: Medicaid Other | Admitting: Obstetrics and Gynecology

## 2023-07-24 ENCOUNTER — Other Ambulatory Visit: Payer: Medicaid Other

## 2023-07-24 VITALS — BP 103/71 | HR 80 | Wt 229.9 lb

## 2023-07-24 DIAGNOSIS — O34219 Maternal care for unspecified type scar from previous cesarean delivery: Secondary | ICD-10-CM

## 2023-07-24 DIAGNOSIS — Z3A30 30 weeks gestation of pregnancy: Secondary | ICD-10-CM

## 2023-07-24 DIAGNOSIS — O99350 Diseases of the nervous system complicating pregnancy, unspecified trimester: Secondary | ICD-10-CM

## 2023-07-24 DIAGNOSIS — O0990 Supervision of high risk pregnancy, unspecified, unspecified trimester: Secondary | ICD-10-CM

## 2023-07-24 DIAGNOSIS — G40909 Epilepsy, unspecified, not intractable, without status epilepticus: Secondary | ICD-10-CM

## 2023-07-24 DIAGNOSIS — O99210 Obesity complicating pregnancy, unspecified trimester: Secondary | ICD-10-CM

## 2023-07-24 NOTE — Progress Notes (Signed)
Pt. Presents for rob. Pt. Wants to schedule her induction. No other questions or concerns at this time.

## 2023-07-24 NOTE — Progress Notes (Signed)
   PRENATAL VISIT NOTE  Subjective:  Brittany Hendrix is a 32 y.o. [phone] at [redacted]w[redacted]d being seen today for ongoing prenatal care.  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  Patient reports no complaints.  Contractions: Not present. Vag. Bleeding: None.  Movement: Present. Denies leaking of fluid.   The following portions of the patient's history were reviewed and updated as appropriate: allergies, current medications, past family history, past medical history, past social history, past surgical history and problem list.   Objective:   Vitals:   07/24/23 0849  BP: 103/71  Pulse: 80  Weight: 229 lb 14.4 oz (104.3 kg)    Fetal Status: Fetal Heart Rate (bpm): 128   Movement: Present     General:  Alert, oriented and cooperative. Patient is in no acute distress.  Skin: Skin is warm and dry. No rash noted.   Cardiovascular: Normal heart rate noted  Respiratory: Normal respiratory effort, no problems with respiration noted  Abdomen: Soft, gravid, appropriate for gestational age.  Pain/Pressure: Present     Pelvic: Cervical exam deferred        Extremities: Normal range of motion.  Edema: None  Mental Status: Normal mood and affect. Normal behavior. Normal judgment and thought content.   Assessment and Plan:  Pregnancy: [id number] at [redacted]w[redacted]d 1. Supervision of high risk pregnancy, antepartum (Primary) BP and FHR normal Doing well, feeling regular movement  FHR in clinic sounded abnormal, NST reactive normal  movement Discussed hospital precautions  2. Previous cesarean delivery affecting pregnancy Planning repeat   3. Seizure disorder in pregnancy, antepartum [facility]) Following [facility], on 1000mg  Keppra, next appt in February   4. Obesity during pregnancy, antepartum 12/10 u/s EFW 40%, normal afi, follow up scheduled 1/7  5. [redacted] weeks gestation of pregnancy GTT and labs today  Still thinking about IUD vs pills  Preterm labor symptoms and general obstetric precautions including but not limited to vaginal bleeding, contractions, leaking of fluid and fetal movement were reviewed in detail with the patient. Please refer to After Visit Summary for other counseling recommendations.   Return in 2 weeks (on 08/07/2023) for [facility] visit.  Future Appointments  Date Time Provider Department Center  08/01/2023  3:30 PM [facility] US4 [facility]  08/09/2023  8:55 AM Warden Fillers, MD [facility] None  09/13/2023  8:00 AM Glean Salvo, NP [facility] None    Albertine Grates, FNP

## 2023-07-25 LAB — CBC
Hematocrit: 32.5 % — ABNORMAL LOW (ref 34.0–46.6)
Hemoglobin: 10.8 g/dL — ABNORMAL LOW (ref 11.1–15.9)
MCH: 31.7 pg (ref 26.6–33.0)
MCHC: 33.2 g/dL (ref 31.5–35.7)
MCV: 95 fL (ref 79–97)
Platelets: 206 10*3/uL (ref 150–450)
RBC: 3.41 x10E6/uL — ABNORMAL LOW (ref 3.77–5.28)
RDW: 12.1 % (ref 11.7–15.4)
WBC: 6.8 10*3/uL (ref 3.4–10.8)

## 2023-07-25 LAB — RPR: RPR Ser Ql: NONREACTIVE

## 2023-07-25 LAB — GLUCOSE TOLERANCE, 2 HOURS W/ 1HR
Glucose, 1 hour: 99 mg/dL (ref 70–179)
Glucose, 2 hour: 90 mg/dL (ref 70–152)
Glucose, Fasting: 86 mg/dL (ref 70–91)

## 2023-07-25 LAB — HIV ANTIBODY (ROUTINE TESTING W REFLEX): HIV Screen 4th Generation wRfx: NONREACTIVE

## 2023-07-25 MED ORDER — FERROUS GLUCONATE 324 (38 FE) MG PO TABS: 324.0000 mg | ORAL_TABLET | Freq: Every day | ORAL | 3 refills | Status: DC

## 2023-08-01 ENCOUNTER — Ambulatory Visit: Payer: Medicaid Other | Attending: Obstetrics and Gynecology

## 2023-08-03 ENCOUNTER — Other Ambulatory Visit: Payer: Self-pay | Admitting: Neurology

## 2023-08-03 NOTE — Telephone Encounter (Signed)
 Pt called wanting to know when this will be called in for her. Pt has a yr f/u already scheduled.

## 2023-08-03 NOTE — Telephone Encounter (Signed)
 I refilled her Keppra . Would recommend she have baseline Keppra  level checked. Order is in. She can have done at her next OB visit or come here.Make sure taking folic acid  at least 1 mg. Thanks  Meds ordered this encounter  Medications   levETIRAcetam  (KEPPRA ) 1000 MG tablet    Sig: Take 1 tablet by mouth twice daily    Dispense:  120 tablet    Refill:  0

## 2023-08-03 NOTE — Telephone Encounter (Signed)
 Patient's last appointment was on 06/15/2022. Follow up scheduled for 09/13/2023. She is currently pregnant, please advise if refill is appropriate.

## 2023-08-09 ENCOUNTER — Other Ambulatory Visit: Payer: Self-pay | Admitting: Obstetrics and Gynecology

## 2023-08-09 ENCOUNTER — Ambulatory Visit: Payer: Medicaid Other | Admitting: Obstetrics and Gynecology

## 2023-08-09 VITALS — BP 110/70 | HR 84 | Wt 228.5 lb

## 2023-08-09 DIAGNOSIS — O99213 Obesity complicating pregnancy, third trimester: Secondary | ICD-10-CM

## 2023-08-09 DIAGNOSIS — O99353 Diseases of the nervous system complicating pregnancy, third trimester: Secondary | ICD-10-CM

## 2023-08-09 DIAGNOSIS — G40909 Epilepsy, unspecified, not intractable, without status epilepticus: Secondary | ICD-10-CM

## 2023-08-09 DIAGNOSIS — Z98891 History of uterine scar from previous surgery: Secondary | ICD-10-CM

## 2023-08-09 DIAGNOSIS — O0990 Supervision of high risk pregnancy, unspecified, unspecified trimester: Secondary | ICD-10-CM

## 2023-08-09 DIAGNOSIS — Z3A33 33 weeks gestation of pregnancy: Secondary | ICD-10-CM

## 2023-08-09 DIAGNOSIS — O99210 Obesity complicating pregnancy, unspecified trimester: Secondary | ICD-10-CM

## 2023-08-09 DIAGNOSIS — E669 Obesity, unspecified: Secondary | ICD-10-CM

## 2023-08-09 DIAGNOSIS — O34219 Maternal care for unspecified type scar from previous cesarean delivery: Secondary | ICD-10-CM

## 2023-08-09 NOTE — Progress Notes (Signed)
   PRENATAL VISIT NOTE  Subjective:  Brittany Hendrix is a 33 y.o. [phone] at [redacted]w[redacted]d being seen today for ongoing prenatal care.  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  Patient doing well with no acute concerns today. She reports no complaints.  Contractions: Irregular. Vag. Bleeding: None.  Movement: Present. Denies leaking of fluid.   The following portions of the patient's history were reviewed and updated as appropriate: allergies, current medications, past family history, past medical history, past social history, past surgical history and problem list. Problem list updated.  Objective:   Vitals:   08/09/23 0853  BP: 110/70  Pulse: 84  Weight: 228 lb 8 oz (103.6 kg)    Fetal Status: Fetal Heart Rate (bpm): 134 Fundal Height: 34 cm Movement: Present     General:  Alert, oriented and cooperative. Patient is in no acute distress.  Skin: Skin is warm and dry. No rash noted.   Cardiovascular: Normal heart rate noted  Respiratory: Normal respiratory effort, no problems with respiration noted  Abdomen: Soft, gravid, appropriate for gestational age.  Pain/Pressure: Absent     Pelvic: Cervical exam deferred        Extremities: Normal range of motion.  Edema: None  Mental Status:  Normal mood and affect. Normal behavior. Normal judgment and thought content.   Assessment and Plan:  Pregnancy: [id number] at [redacted]w[redacted]d  1. [redacted] weeks gestation of pregnancy (Primary)   2. Obesity during  pregnancy, antepartum   3. Supervision of high risk pregnancy, antepartum Continue routine prenatal care.  Pt missed follow up ultrasound, will reorder scan. Pt advised to restart baby ASA to decrease preeclampsia risk. - US  MFM OB FOLLOW UP; Future  4. Previous cesarean delivery affecting pregnancy Pt desires repeat c section with postpartum progesterone IUD placement.  Pt asked if c section date could be scheduled today, will send request  5. History of 2 cesarean sections Discussed high order c section risks.  Pt declined tubal ligation and and wants postpartum IUD.  She estimates having potentially one more child.  6. Seizure disorder in pregnancy, antepartum (HCC) Pt compliant with keppra  at [facility] time.  She does not report new seizure activity  Preterm labor symptoms and general obstetric precautions including but not limited to vaginal bleeding, contractions, leaking of fluid and fetal movement were reviewed in detail with the patient.  Please refer to After Visit Summary for other counseling recommendations.   Return in about 2 weeks (around 08/23/2023) for [facility], in person.   Avie Boeck, MD Faculty Attending Center for [facility]

## 2023-08-09 NOTE — Progress Notes (Signed)
 No c/o today

## 2023-08-22 ENCOUNTER — Other Ambulatory Visit: Payer: Self-pay

## 2023-08-22 ENCOUNTER — Encounter: Payer: Self-pay | Admitting: Obstetrics and Gynecology

## 2023-08-22 ENCOUNTER — Observation Stay
Admission: EM | Admit: 2023-08-22 | Discharge: 2023-08-22 | Disposition: A | Discharge status: Home / Self Care | Payer: Medicaid Other | Attending: Obstetrics | Admitting: Obstetrics

## 2023-08-22 ENCOUNTER — Observation Stay: Payer: Medicaid Other

## 2023-08-22 DIAGNOSIS — O26893 Other specified pregnancy related conditions, third trimester: Secondary | ICD-10-CM | POA: Diagnosis present

## 2023-08-22 DIAGNOSIS — O99513 Diseases of the respiratory system complicating pregnancy, third trimester: Secondary | ICD-10-CM

## 2023-08-22 DIAGNOSIS — J209 Acute bronchitis, unspecified: Secondary | ICD-10-CM | POA: Diagnosis not present

## 2023-08-22 DIAGNOSIS — R509 Fever, unspecified: Secondary | ICD-10-CM | POA: Diagnosis not present

## 2023-08-22 DIAGNOSIS — Z20822 Contact with and (suspected) exposure to covid-19: Secondary | ICD-10-CM | POA: Insufficient documentation

## 2023-08-22 DIAGNOSIS — O0990 Supervision of high risk pregnancy, unspecified, unspecified trimester: Principal | ICD-10-CM

## 2023-08-22 DIAGNOSIS — Z3A35 35 weeks gestation of pregnancy: Secondary | ICD-10-CM | POA: Insufficient documentation

## 2023-08-22 LAB — COMPREHENSIVE METABOLIC PANEL
ALT: 16 U/L (ref 0–44)
AST: 18 U/L (ref 15–41)
Albumin: 2.8 g/dL — ABNORMAL LOW (ref 3.5–5.0)
Alkaline Phosphatase: 110 U/L (ref 38–126)
Anion gap: 11 (ref 5–15)
BUN: 5 mg/dL — ABNORMAL LOW (ref 6–20)
CO2: 19 mmol/L — ABNORMAL LOW (ref 22–32)
Calcium: 8.3 mg/dL — ABNORMAL LOW (ref 8.9–10.3)
Chloride: 104 mmol/L (ref 98–111)
Creatinine, Ser: 0.42 mg/dL — ABNORMAL LOW (ref 0.44–1.00)
GFR, Estimated: >60 mL/min (ref >60)
Glucose, Bld: 80 mg/dL (ref 70–99)
Potassium: 3.5 mmol/L (ref 3.5–5.1)
Sodium: 134 mmol/L — ABNORMAL LOW (ref 135–145)
Total Bilirubin: 1.6 mg/dL — ABNORMAL HIGH (ref 0.0–1.2)
Total Protein: 6.3 g/dL — ABNORMAL LOW (ref 6.5–8.1)

## 2023-08-22 LAB — CBC
HCT: 31.1 % — ABNORMAL LOW (ref 36.0–46.0)
Hemoglobin: 10.8 g/dL — ABNORMAL LOW (ref 12.0–15.0)
MCH: 31.7 pg (ref 26.0–34.0)
MCHC: 34.7 g/dL (ref 30.0–36.0)
MCV: 91.2 fL (ref 80.0–100.0)
Platelets: 214 10*3/uL (ref 150–400)
RBC: 3.41 MIL/uL — ABNORMAL LOW (ref 3.87–5.11)
RDW: 12.6 % (ref 11.5–15.5)
WBC: 7.2 10*3/uL (ref 4.0–10.5)
nRBC: 0 % (ref 0.0–0.2)

## 2023-08-22 LAB — URINALYSIS, COMPLETE (UACMP) WITH MICROSCOPIC
Bilirubin Urine: NEGATIVE
Glucose, UA: NEGATIVE mg/dL
Hgb urine dipstick: NEGATIVE
Ketones, ur: 80 mg/dL — AB
Leukocytes,Ua: NEGATIVE
Nitrite: NEGATIVE
Protein, ur: 30 mg/dL — AB
Specific Gravity, Urine: 1.023 (ref 1.005–1.030)
pH: 6 (ref 5.0–8.0)

## 2023-08-22 LAB — RESP PANEL BY RT-PCR (RSV, FLU A&B, COVID)  RVPGX2
Influenza A by PCR: NEGATIVE
Influenza B by PCR: NEGATIVE
Resp Syncytial Virus by PCR: NEGATIVE
SARS Coronavirus 2 by RT PCR: NEGATIVE

## 2023-08-22 MED ORDER — HYDROXYZINE HCL 25 MG PO TABS: 50.0000 mg | ORAL_TABLET | Freq: Four times a day (QID) | ORAL | Status: DC | PRN

## 2023-08-22 MED ORDER — DM-GUAIFENESIN ER 30-600 MG PO TB12
1.0000 | ORAL_TABLET | Freq: Two times a day (BID) | ORAL | Status: DC
  Administered 2023-08-22: 1 via ORAL
  Filled 2023-08-22: qty 1

## 2023-08-22 MED ORDER — ONDANSETRON HCL 4 MG/2ML IJ SOLN
4.0000 mg | Freq: Four times a day (QID) | INTRAMUSCULAR | Status: DC | PRN
  Administered 2023-08-22: 4 mg via INTRAVENOUS
  Filled 2023-08-22: qty 2

## 2023-08-22 MED ORDER — AZITHROMYCIN 250 MG PO TABS: 250.0000 mg | ORAL_TABLET | Freq: Every day | ORAL | 0 refills | Status: DC

## 2023-08-22 MED ORDER — ONDANSETRON 4 MG PO TBDP
4.0000 mg | ORAL_TABLET | Freq: Three times a day (TID) | ORAL | 1 refills | Status: DC | PRN
  Filled 2023-08-22: qty 30, 10d supply, fill #0

## 2023-08-22 MED ORDER — SOD CITRATE-CITRIC ACID 500-334 MG/5ML PO SOLN: 30.0000 mL | ORAL | Status: DC | PRN

## 2023-08-22 MED ORDER — ACETAMINOPHEN 325 MG PO TABS
650.0000 mg | ORAL_TABLET | ORAL | Status: DC | PRN
  Administered 2023-08-22 (×2): 650 mg via ORAL
  Filled 2023-08-22 (×2): qty 2

## 2023-08-22 MED ORDER — AZITHROMYCIN 250 MG PO TABS
250.0000 mg | ORAL_TABLET | Freq: Every day | ORAL | Status: DC
  Filled 2023-08-22: qty 1

## 2023-08-22 MED ORDER — LACTATED RINGERS IV BOLUS
1000.0000 mL | Freq: Once | INTRAVENOUS | Status: AC
Start: 2023-08-22 — End: 2023-08-22
  Administered 2023-08-22: 1000 mL via INTRAVENOUS

## 2023-08-22 MED ORDER — DM-GUAIFENESIN ER 30-600 MG PO TB12: 1.0000 | ORAL_TABLET | Freq: Two times a day (BID) | ORAL | 0 refills | Status: DC

## 2023-08-22 MED ORDER — AZITHROMYCIN 500 MG PO TABS
500.0000 mg | ORAL_TABLET | Freq: Every day | ORAL | Status: AC
  Administered 2023-08-22: 500 mg via ORAL
  Filled 2023-08-22: qty 1

## 2023-08-22 MED ORDER — ALBUTEROL SULFATE HFA 108 (90 BASE) MCG/ACT IN AERS
2.0000 | INHALATION_SPRAY | RESPIRATORY_TRACT | Status: DC
Start: 2023-08-22 — End: 2023-08-22
  Administered 2023-08-22: 2 via RESPIRATORY_TRACT
  Filled 2023-08-22: qty 6.7

## 2023-08-22 MED ORDER — ONDANSETRON 4 MG PO TBDP: 4.0000 mg | ORAL_TABLET | Freq: Three times a day (TID) | ORAL | 1 refills | Status: DC | PRN

## 2023-08-22 MED ORDER — LACTATED RINGERS IV SOLN: 500.0000 mL | INTRAVENOUS | Status: DC | PRN

## 2023-08-22 MED ORDER — ONDANSETRON HCL 4 MG/2ML IJ SOLN: 4.0000 mg | Freq: Four times a day (QID) | INTRAMUSCULAR | 0 refills | Status: DC | PRN

## 2023-08-22 MED ORDER — AZITHROMYCIN 250 MG PO TABS
250.0000 mg | ORAL_TABLET | Freq: Every day | ORAL | 0 refills | Status: DC
  Filled 2023-08-22: qty 4, 4d supply, fill #0

## 2023-08-22 MED ORDER — DM-GUAIFENESIN ER 30-600 MG PO TB12
1.0000 | ORAL_TABLET | Freq: Two times a day (BID) | ORAL | 0 refills | Status: DC
  Filled 2023-08-22: qty 60, 30d supply, fill #0

## 2023-08-22 NOTE — Progress Notes (Signed)
Pt presents to L/D triage for ongoing illness-related symptoms that have been ongoing for the past month. Pt reports that her housemate traveled to [place] and came back with an illness.  Pt reports ongoing SOB, general malaise, occasional fever, chills, congestion, and non-productive cough.  She also reports in the past three days experiencing ongoing N/V and diarrhea. Pt able to keep down fluids.  NST reactive. Respiratory swab collected and IV/PO fluids given. Meds given for symptom management.  Pt having expiratory wheezes. Chest Xray performed.   Pt reports no bleeding or LOF and positive fetal movement. She complains of ongoing constant lower back pain that sometimes radiates down. Pt reports it is sometimes relieved with position changes or rest. UA collected. Pt states she is not feeling contractions or abdominal pain at this time.

## 2023-08-22 NOTE — OB Triage Note (Signed)
Pt discharged home per order.   Pt stable and ambulatory and an After Visit Summary was printed and given to the patient. Discharge education completed with patient/family including follow up instructions, appointments, and medication list.  Patient able to verbalize understanding, all questions fully answered upon discharge. Patient instructed to return to ED, call 911, or call MD for any changes in condition. Pt discharged home via personal vehicle with prescriptions.

## 2023-08-22 NOTE — OB Triage Note (Signed)
LABOR & DELIVERY OB TRIAGE NOTE  SUBJECTIVE  HPI Brittany Hendrix is a 33 y.o. [id number] at [redacted]w[redacted]d who presents to Labor & Delivery for fever and chills that come and go, upper respiratory symptoms, poor appetite, cough, and occasional shortness of breath. This started the first week in January after a friend exposed her to an illness. She was for about two weeks, starting feeling better, and then had a recurrence of symptoms. For the past few days, she has also had nausea, vomiting, and diarrhea. She last vomited this morning around 0200. She denies ctx, LOF, and vaginal bleeding and reports good fetal movement.  OB History     Gravida  10   Para  3   Term  3   Preterm  0   AB  6   Living  3      SAB  5   IAB  1   Ectopic  0   Multiple  0   Live Births  3           Scheduled Meds:  albuterol  2 puff Inhalation Q4H   dextromethorphan-guaiFENesin  1 tablet Oral BID   Continuous Infusions:  lactated ringers     PRN Meds:.acetaminophen, hydrOXYzine, lactated ringers, ondansetron, sodium citrate-citric acid  OBJECTIVE  BP 106/66 (BP Location: Left Arm)   Pulse 95   Temp 98.3 F (36.8 C) (Oral)   Resp 20   Ht 5\' 4"  (1.626 m)   Wt 103.4 kg   LMP 12/20/2022 (Exact Date)   SpO2 98%   BMI 39.14 kg/m   General: alert, cooperative, appears ill Heart: RRR Lungs: CTAB, slightly diminished at bases Abdomen: soft, gravid, non-tender, normal bowel sounds  Respiratory panel negative for COVID, flu A & B, and RSV  NST I reviewed the NST and it was reactive.  Baseline: 135 Variability: moderate Accelerations: present Decelerations:none Toco: irritability Category 1  CXR shows mild  bronchial thickening without focal airspace disease  ASSESSMENT Impression  1) Pregnancy at [id number], [redacted]w[redacted]d, Estimated Date of Delivery: 09/26/23 2) Reassuring maternal/fetal status 3) Bronchitis  PLAN  1) Discussed findings and plan with Dr. Valentino Hendrix. Will prescribe Z pak,  inhaler PRN, Mucinex, Zofran for symptom relief 2) Keep ROB appt tomorrow 3) Discharge home with return/labor precautions  Brittany Hendrix, CNM 08/22/23  2:12 PM

## 2023-08-23 ENCOUNTER — Other Ambulatory Visit (HOSPITAL_COMMUNITY)
Admission: RE | Admit: 2023-08-23 | Discharge: 2023-08-23 | Disposition: A | Discharge status: Home / Self Care | Payer: Medicaid Other | Source: Ambulatory Visit | Attending: Obstetrics & Gynecology | Admitting: Obstetrics & Gynecology

## 2023-08-23 ENCOUNTER — Ambulatory Visit (INDEPENDENT_AMBULATORY_CARE_PROVIDER_SITE_OTHER): Payer: Medicaid Other | Admitting: Obstetrics & Gynecology

## 2023-08-23 VITALS — BP 93/63 | HR 72 | Wt 224.0 lb

## 2023-08-23 DIAGNOSIS — Z3A35 35 weeks gestation of pregnancy: Secondary | ICD-10-CM | POA: Diagnosis present

## 2023-08-23 DIAGNOSIS — O34219 Maternal care for unspecified type scar from previous cesarean delivery: Secondary | ICD-10-CM

## 2023-08-23 DIAGNOSIS — E669 Obesity, unspecified: Secondary | ICD-10-CM

## 2023-08-23 DIAGNOSIS — G40909 Epilepsy, unspecified, not intractable, without status epilepticus: Secondary | ICD-10-CM

## 2023-08-23 DIAGNOSIS — O99213 Obesity complicating pregnancy, third trimester: Secondary | ICD-10-CM

## 2023-08-23 DIAGNOSIS — O99353 Diseases of the nervous system complicating pregnancy, third trimester: Secondary | ICD-10-CM

## 2023-08-23 DIAGNOSIS — O0990 Supervision of high risk pregnancy, unspecified, unspecified trimester: Secondary | ICD-10-CM | POA: Diagnosis present

## 2023-08-23 DIAGNOSIS — Z98891 History of uterine scar from previous surgery: Secondary | ICD-10-CM

## 2023-08-23 DIAGNOSIS — O99210 Obesity complicating pregnancy, unspecified trimester: Secondary | ICD-10-CM

## 2023-08-23 NOTE — Progress Notes (Signed)
   PRENATAL VISIT NOTE  Subjective:  Brittany Hendrix is a 33 y.o. [phone] at [redacted]w[redacted]d being seen today for ongoing prenatal care.  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  Patient reports  cough and fatigue, dx bronchitis and is being treated .  Contractions: Not present.  .  Movement: Present. Denies leaking of fluid.  Mucus d/c The following portions of the patient's history were reviewed and updated as appropriate: allergies, current medications, past family history, past medical history, past social history, past surgical history and problem list.   Objective:   Vitals:   08/23/23 0850  BP: 93/63  Pulse: 72  Weight: 224 lb (101.6 kg)    Fetal Status: Fetal Heart Rate (bpm): 145   Movement: Present  Presentation: Vertex  General:  Alert, oriented and cooperative. Patient is in no acute distress.  Skin: Skin is warm and dry. No rash noted.   Cardiovascular: Normal heart rate noted  Respiratory: Normal respiratory effort, no problems with respiration noted  Abdomen: Soft, gravid, appropriate for gestational age.  Pain/Pressure: Present     Pelvic: Cervical exam performed in the presence of a chaperone Dilation: 1.5 Effacement (%): 30 Station: Ballotable  Extremities: Normal range of motion.  Edema: None  Mental Status: Normal mood and affect. Normal behavior. Normal judgment and thought content.  White mucus d/c noted no pool Assessment and Plan:  Pregnancy: [id number] at [redacted]w[redacted]d 1.  Supervision of high risk pregnancy, antepartum (Primary)   2. Seizure disorder in pregnancy, antepartum (HCC) Keppra  3. Obesity during pregnancy, antepartum F/u US in 2 days  4. History of 2 cesarean sections RCS 39 weeks  Preterm labor symptoms and general obstetric precautions including but not limited to vaginal bleeding, contractions, leaking of fluid and fetal movement were reviewed in detail with the patient. Please refer to After Visit Summary for other counseling recommendations.   Return in about 1 week (around 08/30/2023).  Future Appointments  Date Time Provider Department Center  08/25/2023 10:15 AM [facility] NURSE [facility]  08/25/2023 10:30 AM [facility] US6 [facility]  09/13/2023  8:00 AM Glean Salvo, NP [facility] None    Scheryl Darter, MD

## 2023-08-23 NOTE — Progress Notes (Signed)
[facility]OB, was dx with Bronchitis at [facility].

## 2023-08-24 ENCOUNTER — Ambulatory Visit: Payer: Medicaid Other

## 2023-08-24 LAB — CERVICOVAGINAL ANCILLARY ONLY
Bacterial Vaginitis (gardnerella): NEGATIVE
Candida Glabrata: NEGATIVE
Candida Vaginitis: POSITIVE — AB
Chlamydia: NEGATIVE
Comment: NEGATIVE
Comment: NEGATIVE
Comment: NEGATIVE
Comment: NEGATIVE
Comment: NEGATIVE
Comment: NORMAL
Neisseria Gonorrhea: NEGATIVE
Trichomonas: NEGATIVE

## 2023-08-25 ENCOUNTER — Ambulatory Visit: Payer: Medicaid Other | Attending: Obstetrics and Gynecology

## 2023-08-25 ENCOUNTER — Other Ambulatory Visit: Payer: Self-pay | Admitting: Obstetrics and Gynecology

## 2023-08-25 ENCOUNTER — Other Ambulatory Visit: Payer: Self-pay | Admitting: *Deleted

## 2023-08-25 ENCOUNTER — Ambulatory Visit: Payer: Medicaid Other | Admitting: *Deleted

## 2023-08-25 VITALS — BP 110/63 | HR 78

## 2023-08-25 DIAGNOSIS — E669 Obesity, unspecified: Secondary | ICD-10-CM | POA: Diagnosis not present

## 2023-08-25 DIAGNOSIS — O99213 Obesity complicating pregnancy, third trimester: Secondary | ICD-10-CM | POA: Diagnosis not present

## 2023-08-25 DIAGNOSIS — Z98891 History of uterine scar from previous surgery: Secondary | ICD-10-CM

## 2023-08-25 DIAGNOSIS — O0990 Supervision of high risk pregnancy, unspecified, unspecified trimester: Secondary | ICD-10-CM

## 2023-08-25 DIAGNOSIS — O09293 Supervision of pregnancy with other poor reproductive or obstetric history, third trimester: Secondary | ICD-10-CM

## 2023-08-25 DIAGNOSIS — G40909 Epilepsy, unspecified, not intractable, without status epilepticus: Secondary | ICD-10-CM

## 2023-08-25 DIAGNOSIS — Z3A35 35 weeks gestation of pregnancy: Secondary | ICD-10-CM

## 2023-08-25 DIAGNOSIS — O0943 Supervision of pregnancy with grand multiparity, third trimester: Secondary | ICD-10-CM

## 2023-08-25 DIAGNOSIS — O2623 Pregnancy care for patient with recurrent pregnancy loss, third trimester: Secondary | ICD-10-CM

## 2023-08-25 DIAGNOSIS — O34219 Maternal care for unspecified type scar from previous cesarean delivery: Secondary | ICD-10-CM

## 2023-08-25 DIAGNOSIS — O99353 Diseases of the nervous system complicating pregnancy, third trimester: Secondary | ICD-10-CM

## 2023-08-27 LAB — CULTURE, BETA STREP (GROUP B ONLY): Strep Gp B Culture: NEGATIVE

## 2023-08-30 ENCOUNTER — Ambulatory Visit (INDEPENDENT_AMBULATORY_CARE_PROVIDER_SITE_OTHER): Payer: Medicaid Other | Admitting: Obstetrics & Gynecology

## 2023-08-30 VITALS — BP 103/67 | HR 79 | Wt 220.7 lb

## 2023-08-30 DIAGNOSIS — O99210 Obesity complicating pregnancy, unspecified trimester: Secondary | ICD-10-CM

## 2023-08-30 DIAGNOSIS — O99350 Diseases of the nervous system complicating pregnancy, unspecified trimester: Secondary | ICD-10-CM

## 2023-08-30 DIAGNOSIS — Z98891 History of uterine scar from previous surgery: Secondary | ICD-10-CM | POA: Diagnosis not present

## 2023-08-30 DIAGNOSIS — O0990 Supervision of high risk pregnancy, unspecified, unspecified trimester: Secondary | ICD-10-CM | POA: Diagnosis not present

## 2023-08-30 DIAGNOSIS — G40909 Epilepsy, unspecified, not intractable, without status epilepticus: Secondary | ICD-10-CM

## 2023-08-30 NOTE — Progress Notes (Signed)
   PRENATAL VISIT NOTE  Subjective:  Brittany Hendrix is a 33 y.o. [phone] at [redacted]w[redacted]d being seen today for ongoing prenatal care.  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  Patient reports occasional contractions.  Contractions: Irritability. Vag. Bleeding: None.  Movement: Present. Denies leaking of fluid.   The following portions of the patient's history were reviewed and updated as appropriate: allergies, current medications, past family history, past medical history, past social history, past surgical history and problem list.   Objective:   Vitals:   08/30/23 0919  BP: 103/67  Pulse: 79  Weight: 220 lb 11.2 oz (100.1 kg)    Fetal Status: Fetal Heart Rate (bpm): 148   Movement: Present  Presentation: Vertex  General:  Alert, oriented and cooperative. Patient is in no acute distress.  Skin: Skin is warm and dry. No rash noted.   Cardiovascular: Normal heart rate noted  Respiratory: Normal respiratory effort, no problems with respiration noted  Abdomen: Soft, gravid, appropriate for gestational age.  Pain/Pressure: Present     Pelvic: Cervical exam performed in the presence of a chaperone Dilation: Closed Effacement (%): 40 Station: Ballotable  Extremities: Normal range of motion.  Edema: None  Mental Status: Normal mood and affect. Normal behavior. Normal judgment and thought content.   Assessment and Plan:  Pregnancy: [id number] at [redacted]w[redacted]d 1. Obesity during pregnancy, antepartum  (Primary)   2. History of 2 cesarean sections RCS IUD insertion 09/19/23  3. Supervision of high risk pregnancy, antepartum   4. Seizure disorder in pregnancy, antepartum (HCC)   Preterm labor symptoms and general obstetric precautions including but not limited to vaginal bleeding, contractions, leaking of fluid and fetal movement were reviewed in detail with the patient. Please refer to After Visit Summary for other counseling recommendations.   Return in about 1 week (around 09/06/2023).  Future Appointments  Date Time Provider Department Center  09/08/2023  9:15 AM [facility] NURSE [facility]  09/08/2023  9:30 AM [facility] US6 [facility]  09/13/2023  8:00 AM Gayland Lauraine PARAS, NP [facility] None  09/15/2023 11:15 AM [facility] NURSE [facility]  09/15/2023 11:30 AM [facility] US6 [facility]    Lynwood Solomons, MD

## 2023-08-30 NOTE — Progress Notes (Signed)
 Pt. Presents for rob. Pt would like to be checked for dilation.

## 2023-09-01 ENCOUNTER — Telehealth: Payer: Self-pay

## 2023-09-01 NOTE — Telephone Encounter (Signed)
 Spoke with patient about [facility] ultrasound appointment - patient could not come into office this week

## 2023-09-05 ENCOUNTER — Other Ambulatory Visit: Payer: Self-pay

## 2023-09-06 ENCOUNTER — Encounter: Payer: Medicaid Other | Admitting: Obstetrics

## 2023-09-08 ENCOUNTER — Ambulatory Visit: Payer: Medicaid Other | Attending: Obstetrics | Admitting: *Deleted

## 2023-09-08 ENCOUNTER — Other Ambulatory Visit: Payer: Self-pay | Admitting: *Deleted

## 2023-09-08 ENCOUNTER — Ambulatory Visit: Payer: Medicaid Other

## 2023-09-08 VITALS — BP 113/61 | HR 77

## 2023-09-08 DIAGNOSIS — O99213 Obesity complicating pregnancy, third trimester: Secondary | ICD-10-CM | POA: Diagnosis not present

## 2023-09-08 DIAGNOSIS — O0943 Supervision of pregnancy with grand multiparity, third trimester: Secondary | ICD-10-CM

## 2023-09-08 DIAGNOSIS — O403XX0 Polyhydramnios, third trimester, not applicable or unspecified: Secondary | ICD-10-CM | POA: Insufficient documentation

## 2023-09-08 DIAGNOSIS — O2623 Pregnancy care for patient with recurrent pregnancy loss, third trimester: Secondary | ICD-10-CM | POA: Diagnosis not present

## 2023-09-08 DIAGNOSIS — G40909 Epilepsy, unspecified, not intractable, without status epilepticus: Secondary | ICD-10-CM

## 2023-09-08 DIAGNOSIS — O99353 Diseases of the nervous system complicating pregnancy, third trimester: Secondary | ICD-10-CM | POA: Diagnosis present

## 2023-09-08 DIAGNOSIS — O09293 Supervision of pregnancy with other poor reproductive or obstetric history, third trimester: Secondary | ICD-10-CM

## 2023-09-08 DIAGNOSIS — Z3A37 37 weeks gestation of pregnancy: Secondary | ICD-10-CM | POA: Insufficient documentation

## 2023-09-08 DIAGNOSIS — O34219 Maternal care for unspecified type scar from previous cesarean delivery: Secondary | ICD-10-CM | POA: Insufficient documentation

## 2023-09-08 DIAGNOSIS — O409XX0 Polyhydramnios, unspecified trimester, not applicable or unspecified: Secondary | ICD-10-CM

## 2023-09-08 DIAGNOSIS — O0990 Supervision of high risk pregnancy, unspecified, unspecified trimester: Secondary | ICD-10-CM

## 2023-09-08 DIAGNOSIS — E669 Obesity, unspecified: Secondary | ICD-10-CM

## 2023-09-08 DIAGNOSIS — Z98891 History of uterine scar from previous surgery: Secondary | ICD-10-CM

## 2023-09-11 NOTE — Patient Instructions (Signed)
Brittany Hendrix  09/11/2023   Your procedure is scheduled on:  09/19/2023  Arrive at 0745 at Entrance C on [address] at [facility]  and [employer]. You are invited to use the FREE valet parking or use the Visitor's parking deck.  Pick up the phone at the desk and dial [phone].  Call this number if you have problems the morning of surgery: [phone]  Remember:   Do not eat food:(After Midnight) Desps de medianoche.  You may drink clear liquids until arrival at ___0745__.  Clear liquids means a liquid you can see thru.  It can have color such as Cola or Kool aid.  Tea is OK and coffee as long as no milk or creamer of any kind.  Take these medicines the morning of surgery with A SIP OF WATER:  kepra   Do not wear jewelry, make-up or nail polish.  Do not wear lotions, powders, or perfumes. Do not wear deodorant.  Do not shave 48 hours prior to surgery.  Do not bring valuables to the hospital.  [facility] is not   responsible for any belongings or valuables brought to the hospital.  Contacts, dentures or bridgework may not be worn into surgery.  Leave suitcase in the car. After surgery it may be brought to your room.  For patients admitted to the hospital, checkout time is 11:00 AM the day of              discharge.      Please read over the following fact sheets that you were given:     Preparing for Surgery

## 2023-09-12 ENCOUNTER — Telehealth (HOSPITAL_COMMUNITY): Payer: Self-pay | Admitting: *Deleted

## 2023-09-12 ENCOUNTER — Encounter (HOSPITAL_COMMUNITY): Payer: Self-pay

## 2023-09-12 NOTE — Telephone Encounter (Signed)
 Preadmission screen

## 2023-09-13 ENCOUNTER — Telehealth (HOSPITAL_COMMUNITY): Payer: Self-pay | Admitting: *Deleted

## 2023-09-13 ENCOUNTER — Telehealth: Payer: Medicaid Other | Admitting: Neurology

## 2023-09-13 DIAGNOSIS — G40909 Epilepsy, unspecified, not intractable, without status epilepticus: Secondary | ICD-10-CM

## 2023-09-13 MED ORDER — NAYZILAM 5 MG/0.1ML NA SOLN
NASAL | 1 refills | Status: AC
Start: 2023-09-13 — End: ?

## 2023-09-13 MED ORDER — LEVETIRACETAM 1000 MG PO TABS: 1000.0000 mg | ORAL_TABLET | Freq: Two times a day (BID) | ORAL | 3 refills | Status: AC

## 2023-09-13 NOTE — Telephone Encounter (Signed)
 Preadmission screen

## 2023-09-13 NOTE — Progress Notes (Signed)
Patient: Brittany Hendrix Date of Birth: 1990-11-19  Reason for Visit: Follow up History from: Patient Primary Neurologist: [facility]  Virtual Visit via Video Note  I connected with Brittany Hendrix on 09/13/23 at  8:00 AM EST by a video enabled telemedicine application and verified that I am speaking with the correct person using two identifiers.  Location: Patient: at her home Provider: at my home    I discussed the limitations of evaluation and management by telemedicine and the availability of in person appointments. The patient expressed understanding and agreed to proceed.  ASSESSMENT AND PLAN 33 y.o. year old female   1.  Epilepsy 2.  Current pregnancy, 38 weeks, scheduled C-section 09/19/2023 -Denies any issues with pregnancy thus far, not planning to breastfeed  -Mentions last seizure was in April 2024, missed a dose of Keppra -For now, continue Keppra 1000 mg twice daily, continue prenatal vitamin with folic acid 1 mg. Discussed importance of medication compliance. -We discussed switching to Keppra extended release to promote better medication compliance once she delivers the baby.  We will schedule a 87-month follow-up to discuss. She isn't sure she wants to switch -I placed an order to have a Keppra level drawn when she goes to the hospital next week for labs to get a baseline -I will send in Nayzilam 5 mg inhaler as needed in the event of acute seizure.  Will need to have someone else administer we discussed  -Call for seizures, I will see her in 3 months for a virtual visit.  She does not have a driver's license.  Meds ordered this encounter  Medications   levETIRAcetam (KEPPRA) 1000 MG tablet    Sig: Take 1 tablet (1,000 mg total) by mouth 2 (two) times daily.    Dispense:  180 tablet    Refill:  3   Midazolam (NAYZILAM) 5 MG/0.1ML SOLN    Sig: For seizure use 5 mg (1 spray) as a single dose in 1 nostril; may repeat same dose in 10 minutes in alternate nostril  based on response and tolerability (do not repeat if the patient is having trouble breathing or excessive sedation). Maximum dose: 10 mg (2 sprays) per single episode.    Dispense:  2 each    Refill:  1   Orders Placed This Encounter  Procedures   Levetiracetam level   HISTORY  Brittany Hendrix is a 33 years old female, with epilepsy disorder, currently taking Keppra 500 in the morning, 1000 mg in the evening.    She had seizures since she was 16-54 years old.  All seizure has similar seminology. She has an aura before those seizures, she feels drowsy and sometimes nauseated, lasting few minutes, followed by loss of conciousness, whole body shaking, then post ictal confusion usually last 20-30 minutes..    She has injured herself in the face and on her leg  during seizures in the past. She was not treated with anti-epilepsic medications, until after she had a recurrent seizure in May 2012, she was put on Levetiracetam 500 mg b.i.d.    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  her bloodwork tested positive for marijuana.    For a while, she was noncompliant with her medications, also concerned about the medication costs, she continue have recurrent seizures.    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 EEG was normal   She had recurrent seizure in May 2014, 2 seizures in 1 day while taking Keppra 500 twice daily, dosage was increased to 500/1000.     For a while, she struggled to pay for her medications,    Recurrent seizure September 17, 2015 while she was [redacted] weeks pregnant, preceded by body jerking movement, while taking Keppra 1500 mg daily   Then April 03, 2017, May 24, 2020 following hollowing party, with excessive alcohol use, sleep deprived,   She had 2 previous  pregnancy with normal children taking Keppra 1000 twice a day, does not want to increase her dosage now,  Last reported seizure June 2022 missing her morning dose of medications, Keppra level Nov 25, 2019 was 26.9   Update Dec 07, 2021 SS: Here today for virtual visit, is [redacted] weeks pregnant, scheduled for C-section due to placenta previa 01/06/22.  Remains on Keppra 1000 mg twice daily.  Denies any seizure activity.  Never had labs done for Keppra level.  Does not wish to increase the dose.  Transportation is an issue for her.  Update June 15, 2022 SS: Has 27 month old son, was induced for Placenta Previa, had c-section, baby is doing well. Is not breastfeeding. No seizures. Remains on Keppra 1000 mg twice daily. She doesn't drive. Works at [employer]. Is taking birth control pills. She did not take her Keppra today.   Update September 13, 2023 SS: Has not been seen since Nov 2023, Keppra level was 19.8. Currently pregnant, scheduled c-section 09/19/23. On Keppra 1000 mg BID. Has been a good pregnancy. Having a baby boy, healthy thus far. No seizures during pregnancy. Never had Keppra level checked when I ordered. She doesn't drive due to not having a drivers license. Claims last seizure was April 2024, missed her medication, happened midday, felt the seizure coming on, bit her cheek, generalized event, significant other was there. This is her 4th baby.  Remains on prenatal vitamin with folic acid.  REVIEW OF SYSTEMS: Out of a complete 14 system review of symptoms, the patient complains only of the following symptoms, and all other reviewed systems are negative.  See HPI  ALLERGIES: No Known Allergies  HOME MEDICATIONS: Outpatient Medications Prior to Visit  Medication Sig Dispense Refill   aspirin EC 81 MG tablet Take 1 tablet (81 mg total) by mouth daily. Swallow whole. (Patient not taking: Reported on 09/08/2023) 30 tablet 12   azithromycin (ZITHROMAX) 250 MG tablet Take 1 tablet (250 mg total)  by mouth daily. (Patient not taking: Reported on 08/30/2023) 4 tablet 0   azithromycin (ZITHROMAX) 250 MG tablet Take 1 tablet (250 mg total) by mouth daily. (Patient not taking: Reported on 08/30/2023) 4 tablet 0   dextromethorphan-guaiFENesin (MUCINEX DM) 30-600 MG 12hr tablet Take 1 tablet by mouth 2 (two) times daily. (Patient not taking: Reported on 08/30/2023) 60 tablet 0   diphenhydramine-acetaminophen (TYLENOL PM) 25-500 MG TABS tablet Take 1 tablet by mouth at bedtime as needed.     ferrous gluconate (FERGON) 324 MG tablet Take 1 tablet (324 mg total) by mouth daily with breakfast. (Patient not taking: Reported on 08/30/2023) 30 tablet 3   levETIRAcetam (KEPPRA) 1000 MG tablet Take 1 tablet by mouth twice daily 120 tablet 0   ondansetron (ZOFRAN-ODT) 4 MG disintegrating tablet Take 1 tablet (4 mg total) by mouth every 8 (eight) hours as needed for nausea or vomiting. (Patient not  taking: Reported on 08/30/2023) 30 tablet 1   Prenatal Vit-Fe Fumarate-FA (MULTIVITAMIN-PRENATAL) 27-0.8 MG TABS tablet Take 1 tablet by mouth daily at 12 noon. (Patient not taking: Reported on 08/22/2023)     No facility-administered medications prior to visit.    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 [ ]  Inform patient Per up-to-date "considered to be a normal variant. Patients are asymptomatic, have no compromise of fertility, and similar pregnancy outcomes as those in the general obstetric population. "    Headache(784.0)    Seizures (HCC)    Last seizure May 25 2020    PAST SURGICAL HISTORY: Past Surgical History:  Procedure Laterality Date   CESAREAN SECTION N/A 05/20/2020   Procedure: CESAREAN SECTION;  Surgeon: Tereso Newcomer, MD;  Location: [facility];  Service: Obstetrics;  Laterality: N/A;   CESAREAN SECTION N/A 12/23/2021   Procedure: CESAREAN SECTION;  Surgeon: Kathrynn Running, MD;  Location: [facility];  Service:  Obstetrics;  Laterality: N/A;   DILATION AND CURETTAGE OF UTERUS     abortion   DILATION AND EVACUATION N/A 10/04/2017   Procedure: DILATATION AND EVACUATION;  Surgeon: Silverio Lay, MD;  Location: [facility];  Service: Gynecology;  Laterality: N/A;   DILATION AND EVACUATION N/A 12/23/2020   Procedure: DILATATION AND EVACUATION;  Surgeon: Adam Phenix, MD;  Location: [facility] OR;  Service: Gynecology;  Laterality: N/A;   OPERATIVE ULTRASOUND N/A 12/23/2020   Procedure: OPERATIVE ULTRASOUND;  Surgeon: Adam Phenix, MD;  Location: [facility] OR;  Service: Gynecology;  Laterality: N/A;    FAMILY HISTORY: Family History  Problem Relation Age of Onset   Diabetes Mother    Other Father        Hx unknown   Asthma Brother    Colon cancer Neg Hx    Esophageal cancer Neg Hx    Rectal cancer Neg Hx     SOCIAL HISTORY: Social History   Socioeconomic History   Marital status: Single    Spouse name: Not on file   Number of children: 3   Years of education: 12   Highest education level: Not on file  Occupational History   Occupation: Teacher, English as a foreign language  Tobacco Use   Smoking status: Every Day    Current packs/day: 0.50    Average packs/day: 0.5 packs/day for 5.0 years (2.5 ttl pk-yrs)    Types: Cigarettes    Passive exposure: Past   Smokeless tobacco: Never  Vaping Use   Vaping status: Never Used  Substance and Sexual Activity   Alcohol use: Yes    Comment: occasional, not while preg   Drug use: Not Currently    Types: Marijuana    Comment: Occasional 3 times per month   Sexual activity: Yes    Partners: Male    Birth control/protection: None  Other Topics Concern   Not on file  Social History Narrative   Patient is single and lives at home alone. Patient works at [employer]. Right handed. Caffeine three daily.   Social Drivers of Corporate investment banker Strain: Not on file  Food Insecurity: Not on file  Transportation Needs: Not on file  Physical Activity: Not on file  Stress: Not on  file  Social Connections: Not on file  Intimate Partner Violence: Not At Risk (11/14/2022)   Humiliation, Afraid, Rape, and Kick questionnaire    Fear of Current or Ex-Partner: No  Emotionally Abused: No    Physically Abused: No    Sexually Abused: No    PHYSICAL EXAM  There were no vitals filed for this visit.  There is no height or weight on file to calculate BMI.  Generalized: Well developed, in no acute distress   Via video visit, is alert and oriented, speech is clear and concise.  Facial symmetry noted.  Moves about freely.  DIAGNOSTIC DATA (LABS, IMAGING, TESTING) - I reviewed patient records, labs, notes, testing and imaging myself where available.  Lab Results  Component Value Date   WBC 7.2 08/22/2023   HGB 10.8 (L) 08/22/2023   HCT 31.1 (L) 08/22/2023   MCV 91.2 08/22/2023   PLT 214 08/22/2023      Component Value Date/Time   NA 134 (L) 08/22/2023 0923   NA 139 03/16/2023 1139   K 3.5 08/22/2023 0923   CL 104 08/22/2023 0923   CO2 19 (L) 08/22/2023 0923   GLUCOSE 80 08/22/2023 0923   BUN 5 (L) 08/22/2023 0923   BUN 5 (L) 03/16/2023 1139   CREATININE 0.42 (L) 08/22/2023 0923   CALCIUM 8.3 (L) 08/22/2023 0923   PROT 6.3 (L) 08/22/2023 0923   PROT 6.4 03/16/2023 1139   ALBUMIN 2.8 (L) 08/22/2023 0923   ALBUMIN 3.8 (L) 03/16/2023 1139   AST 18 08/22/2023 0923   ALT 16 08/22/2023 0923   ALKPHOS 110 08/22/2023 0923   BILITOT 1.6 (H) 08/22/2023 0923   BILITOT 0.3 03/16/2023 1139   GFRNONAA >60 08/22/2023 0923   GFRAA >60 10/07/2019 2317   No results found for: "CHOL", "HDL", "LDLCALC", "LDLDIRECT", "TRIG", "CHOLHDL" Lab Results  Component Value Date   HGBA1C 5.3 03/16/2023   No results found for: "VITAMINB12" Lab Results  Component Value Date   TSH 0.462 03/16/2023    Margie Ege, AGNP-C, DNP 09/13/2023, 5:59 AM [facility] [address], [address] [place], [place] [address] [phone]

## 2023-09-13 NOTE — Patient Instructions (Signed)
Continue Keppra 1000 mg twice daily for seizure prevention.  Please do not miss any doses.  We discussed switching to Keppra extended release to promote better medication compliance.  I will see you in 3 months to discuss.  We can switch sooner, please reach out to me if you would like to.  I placed an order for Keppra level to be drawn next week when you go for preadmission labs.  Recommend continue prenatal vitamin with folic acid.  I have sent in a seizure rescue medication Nayzilam (review attached information). Call for any seizure events. Thanks! Congrats on the baby!!  Meds ordered this encounter  Medications   levETIRAcetam (KEPPRA) 1000 MG tablet    Sig: Take 1 tablet (1,000 mg total) by mouth 2 (two) times daily.    Dispense:  180 tablet    Refill:  3   Midazolam (NAYZILAM) 5 MG/0.1ML SOLN    Sig: For seizure use 5 mg (1 spray) as a single dose in 1 nostril; may repeat same dose in 10 minutes in alternate nostril based on response and tolerability (do not repeat if the patient is having trouble breathing or excessive sedation). Maximum dose: 10 mg (2 sprays) per single episode.    Dispense:  2 each    Refill:  1

## 2023-09-15 ENCOUNTER — Ambulatory Visit: Payer: Medicaid Other

## 2023-09-15 ENCOUNTER — Encounter (HOSPITAL_COMMUNITY): Payer: Self-pay

## 2023-09-15 NOTE — Progress Notes (Signed)
 Chart reviewed, agree above plan ?

## 2023-09-18 ENCOUNTER — Encounter (HOSPITAL_COMMUNITY): Payer: Self-pay | Admitting: Obstetrics and Gynecology

## 2023-09-18 ENCOUNTER — Encounter (HOSPITAL_COMMUNITY)
Admission: RE | Admit: 2023-09-18 | Discharge: 2023-09-18 | Disposition: A | Discharge status: Home / Self Care | Payer: Medicaid Other | Source: Ambulatory Visit | Attending: Obstetrics and Gynecology | Admitting: Obstetrics and Gynecology

## 2023-09-18 DIAGNOSIS — Z3A00 Weeks of gestation of pregnancy not specified: Secondary | ICD-10-CM | POA: Insufficient documentation

## 2023-09-18 DIAGNOSIS — O0990 Supervision of high risk pregnancy, unspecified, unspecified trimester: Secondary | ICD-10-CM | POA: Insufficient documentation

## 2023-09-18 DIAGNOSIS — Z01812 Encounter for preprocedural laboratory examination: Secondary | ICD-10-CM | POA: Insufficient documentation

## 2023-09-18 LAB — RPR: RPR Ser Ql: NONREACTIVE

## 2023-09-18 LAB — TYPE AND SCREEN
ABO/RH(D): A POS
Antibody Screen: NEGATIVE

## 2023-09-18 LAB — CBC
HCT: 33 % — ABNORMAL LOW (ref 36.0–46.0)
Hemoglobin: 10.9 g/dL — ABNORMAL LOW (ref 12.0–15.0)
MCH: 31.1 pg (ref 26.0–34.0)
MCHC: 33 g/dL (ref 30.0–36.0)
MCV: 94.3 fL (ref 80.0–100.0)
Platelets: 197 10*3/uL (ref 150–400)
RBC: 3.5 MIL/uL — ABNORMAL LOW (ref 3.87–5.11)
RDW: 13.4 % (ref 11.5–15.5)
WBC: 6.4 10*3/uL (ref 4.0–10.5)
nRBC: 0 % (ref 0.0–0.2)

## 2023-09-18 NOTE — Anesthesia Preprocedure Evaluation (Signed)
 Anesthesia Evaluation  Patient identified by MRN, date of birth, ID band Patient awake    Reviewed: Allergy & Precautions, H&P , NPO status , Patient's Chart, lab work & pertinent test results  History of Anesthesia Complications Negative for: history of anesthetic complications  Airway Mallampati: I  TM Distance: >3 FB Neck ROM: Full    Dental no notable dental hx. (+) Chipped,    Pulmonary Current Smoker and Patient abstained from smoking.   Pulmonary exam normal breath sounds clear to auscultation       Cardiovascular negative cardio ROS Normal cardiovascular exam Rhythm:regular Rate:Normal     Neuro/Psych  Headaches, Seizures -, Well Controlled,   negative psych ROS   GI/Hepatic negative GI ROS, Neg liver ROS,,,  Endo/Other  negative endocrine ROS    Renal/GU negative Renal ROS  negative genitourinary   Musculoskeletal   Abdominal   Peds  Hematology negative hematology ROS (+)   Anesthesia Other Findings   Reproductive/Obstetrics (+) Pregnancy                             Anesthesia Physical Anesthesia Plan  ASA: 3  Anesthesia Plan: Spinal   Post-op Pain Management: Minimal or no pain anticipated   Induction: Intravenous  PONV Risk Score and Plan: 1 and Ondansetron and Treatment may vary due to age or medical condition  Airway Management Planned: Natural Airway, Simple Face Mask and Mask  Additional Equipment: None  Intra-op Plan:   Post-operative Plan:   Informed Consent: I have reviewed the patients History and Physical, chart, labs and discussed the procedure including the risks, benefits and alternatives for the proposed anesthesia with the patient or authorized representative who has indicated his/her understanding and acceptance.       Plan Discussed with: Anesthesiologist and CRNA  Anesthesia Plan Comments: (HISTORY  Brittany Hendrix is a 33 years old  female, with epilepsy disorder, currently taking Keppra 500 in the morning, 1000 mg in the evening.    She had seizures since she was 40-55 years old.  All seizure has similar seminology. She has an aura before those seizures, she feels drowsy and sometimes nauseated, lasting few minutes, followed by loss of conciousness, whole body shaking, then post ictal confusion usually last 20-30 minutes.Brittany Hendrix  )        Anesthesia Quick Evaluation

## 2023-09-19 ENCOUNTER — Other Ambulatory Visit: Payer: Self-pay

## 2023-09-19 ENCOUNTER — Inpatient Hospital Stay (HOSPITAL_COMMUNITY)
Admission: RE | Admit: 2023-09-19 | Discharge: 2023-09-21 | DRG: 787 | Disposition: A | Discharge status: Home / Self Care | Payer: Medicaid Other | Attending: Obstetrics and Gynecology | Admitting: Obstetrics and Gynecology

## 2023-09-19 ENCOUNTER — Inpatient Hospital Stay (HOSPITAL_COMMUNITY): Payer: Self-pay | Admitting: Anesthesiology

## 2023-09-19 ENCOUNTER — Encounter (HOSPITAL_COMMUNITY)
Admission: RE | Disposition: A | Discharge status: Home / Self Care | Payer: Self-pay | Source: Home / Self Care | Attending: Obstetrics and Gynecology

## 2023-09-19 ENCOUNTER — Encounter (HOSPITAL_COMMUNITY): Payer: Self-pay | Admitting: Obstetrics and Gynecology

## 2023-09-19 DIAGNOSIS — O99354 Diseases of the nervous system complicating childbirth: Secondary | ICD-10-CM | POA: Diagnosis present

## 2023-09-19 DIAGNOSIS — D62 Acute posthemorrhagic anemia: Secondary | ICD-10-CM | POA: Diagnosis not present

## 2023-09-19 DIAGNOSIS — Z3A39 39 weeks gestation of pregnancy: Secondary | ICD-10-CM | POA: Diagnosis not present

## 2023-09-19 DIAGNOSIS — Z98891 History of uterine scar from previous surgery: Principal | ICD-10-CM

## 2023-09-19 DIAGNOSIS — O9081 Anemia of the puerperium: Secondary | ICD-10-CM | POA: Diagnosis not present

## 2023-09-19 DIAGNOSIS — O99214 Obesity complicating childbirth: Secondary | ICD-10-CM | POA: Diagnosis present

## 2023-09-19 DIAGNOSIS — F17210 Nicotine dependence, cigarettes, uncomplicated: Secondary | ICD-10-CM | POA: Diagnosis present

## 2023-09-19 DIAGNOSIS — O99334 Smoking (tobacco) complicating childbirth: Secondary | ICD-10-CM | POA: Diagnosis present

## 2023-09-19 DIAGNOSIS — G40909 Epilepsy, unspecified, not intractable, without status epilepticus: Secondary | ICD-10-CM | POA: Diagnosis present

## 2023-09-19 DIAGNOSIS — Z833 Family history of diabetes mellitus: Secondary | ICD-10-CM

## 2023-09-19 DIAGNOSIS — O34211 Maternal care for low transverse scar from previous cesarean delivery: Secondary | ICD-10-CM | POA: Diagnosis present

## 2023-09-19 DIAGNOSIS — O403XX0 Polyhydramnios, third trimester, not applicable or unspecified: Secondary | ICD-10-CM | POA: Diagnosis not present

## 2023-09-19 DIAGNOSIS — O99210 Obesity complicating pregnancy, unspecified trimester: Secondary | ICD-10-CM | POA: Diagnosis present

## 2023-09-19 DIAGNOSIS — O99350 Diseases of the nervous system complicating pregnancy, unspecified trimester: Secondary | ICD-10-CM

## 2023-09-19 DIAGNOSIS — Z8759 Personal history of other complications of pregnancy, childbirth and the puerperium: Secondary | ICD-10-CM

## 2023-09-19 DIAGNOSIS — O2620 Pregnancy care for patient with recurrent pregnancy loss, unspecified trimester: Secondary | ICD-10-CM | POA: Diagnosis present

## 2023-09-19 DIAGNOSIS — O0990 Supervision of high risk pregnancy, unspecified, unspecified trimester: Secondary | ICD-10-CM

## 2023-09-19 DIAGNOSIS — E669 Obesity, unspecified: Secondary | ICD-10-CM | POA: Diagnosis present

## 2023-09-19 DIAGNOSIS — O34219 Maternal care for unspecified type scar from previous cesarean delivery: Secondary | ICD-10-CM | POA: Diagnosis present

## 2023-09-19 DIAGNOSIS — Z30430 Encounter for insertion of intrauterine contraceptive device: Secondary | ICD-10-CM

## 2023-09-19 HISTORY — PX: INTRAUTERINE DEVICE (IUD) INSERTION: SHX5877

## 2023-09-19 SURGERY — Surgical Case
Anesthesia: Spinal

## 2023-09-19 MED ORDER — DEXAMETHASONE SODIUM PHOSPHATE 10 MG/ML IJ SOLN
INTRAMUSCULAR | Status: DC | PRN
  Administered 2023-09-19: 10 mg via INTRAVENOUS

## 2023-09-19 MED ORDER — SENNOSIDES-DOCUSATE SODIUM 8.6-50 MG PO TABS
2.0000 | ORAL_TABLET | ORAL | Status: DC
  Administered 2023-09-19 – 2023-09-20 (×2): 2 via ORAL
  Filled 2023-09-19 (×2): qty 2

## 2023-09-19 MED ORDER — COCONUT OIL OIL: 1.0000 | TOPICAL_OIL | Status: DC | PRN

## 2023-09-19 MED ORDER — FENTANYL CITRATE (PF) 100 MCG/2ML IJ SOLN
INTRAMUSCULAR | Status: DC | PRN
  Administered 2023-09-19: 15 ug via INTRATHECAL

## 2023-09-19 MED ORDER — LACTATED RINGERS IV SOLN
INTRAVENOUS | Status: DC
Start: 2023-09-19 — End: 2023-09-19

## 2023-09-19 MED ORDER — DIPHENHYDRAMINE HCL 25 MG PO CAPS: 25.0000 mg | ORAL_CAPSULE | Freq: Four times a day (QID) | ORAL | Status: DC | PRN

## 2023-09-19 MED ORDER — ONDANSETRON HCL 4 MG/2ML IJ SOLN
INTRAMUSCULAR | Status: DC | PRN
  Administered 2023-09-19: 4 mg via INTRAVENOUS

## 2023-09-19 MED ORDER — ENOXAPARIN SODIUM 60 MG/0.6ML IJ SOSY
50.0000 mg | PREFILLED_SYRINGE | INTRAMUSCULAR | Status: DC
  Administered 2023-09-20 – 2023-09-21 (×2): 50 mg via SUBCUTANEOUS
  Filled 2023-09-19 (×2): qty 0.6

## 2023-09-19 MED ORDER — BUPIVACAINE IN DEXTROSE 0.75-8.25 % IT SOLN
INTRATHECAL | Status: DC | PRN
Start: 2023-09-19 — End: 2023-09-19
  Administered 2023-09-19: 1.6 mL via INTRATHECAL

## 2023-09-19 MED ORDER — OXYTOCIN-SODIUM CHLORIDE 30-0.9 UT/500ML-% IV SOLN
INTRAVENOUS | Status: DC | PRN
  Administered 2023-09-19: 300 mL via INTRAVENOUS

## 2023-09-19 MED ORDER — POVIDONE-IODINE 10 % EX SWAB
2.0000 | Freq: Once | CUTANEOUS | Status: AC
  Administered 2023-09-19: 2 via TOPICAL

## 2023-09-19 MED ORDER — LEVONORGESTREL 20 MCG/DAY IU IUD
INTRAUTERINE_SYSTEM | INTRAUTERINE | Status: AC
  Filled 2023-09-19: qty 1

## 2023-09-19 MED ORDER — PHENYLEPHRINE HCL-NACL 20-0.9 MG/250ML-% IV SOLN
INTRAVENOUS | Status: DC | PRN
  Administered 2023-09-19: 60 ug/min via INTRAVENOUS

## 2023-09-19 MED ORDER — LEVONORGESTREL 20 MCG/DAY IU IUD
1.0000 | INTRAUTERINE_SYSTEM | Freq: Once | INTRAUTERINE | Status: AC
  Administered 2023-09-19: 1 via INTRAUTERINE

## 2023-09-19 MED ORDER — ACETAMINOPHEN 500 MG PO TABS
1000.0000 mg | ORAL_TABLET | Freq: Four times a day (QID) | ORAL | Status: DC
  Administered 2023-09-19 – 2023-09-21 (×8): 1000 mg via ORAL
  Filled 2023-09-19 (×8): qty 2

## 2023-09-19 MED ORDER — MEPERIDINE HCL 25 MG/ML IJ SOLN: 6.2500 mg | INTRAMUSCULAR | Status: DC | PRN

## 2023-09-19 MED ORDER — CEFAZOLIN SODIUM-DEXTROSE 2-4 GM/100ML-% IV SOLN
INTRAVENOUS | Status: AC
  Filled 2023-09-19: qty 100

## 2023-09-19 MED ORDER — LACTATED RINGERS IV SOLN: INTRAVENOUS | Status: DC

## 2023-09-19 MED ORDER — DIPHENHYDRAMINE HCL 50 MG/ML IJ SOLN
INTRAMUSCULAR | Status: AC
  Filled 2023-09-19: qty 1

## 2023-09-19 MED ORDER — DIPHENHYDRAMINE HCL 50 MG/ML IJ SOLN
12.5000 mg | Freq: Once | INTRAMUSCULAR | Status: AC
  Administered 2023-09-19: 12.5 mg via INTRAVENOUS

## 2023-09-19 MED ORDER — TRANEXAMIC ACID-NACL 1000-0.7 MG/100ML-% IV SOLN
1000.0000 mg | Freq: Once | INTRAVENOUS | Status: AC
  Administered 2023-09-19: 1000 mg via INTRAVENOUS

## 2023-09-19 MED ORDER — ACETAMINOPHEN 10 MG/ML IV SOLN
INTRAVENOUS | Status: DC | PRN
  Administered 2023-09-19: 1000 mg via INTRAVENOUS

## 2023-09-19 MED ORDER — WITCH HAZEL-GLYCERIN EX PADS: 1.0000 | MEDICATED_PAD | CUTANEOUS | Status: DC | PRN

## 2023-09-19 MED ORDER — OXYCODONE HCL 5 MG/5ML PO SOLN: 5.0000 mg | Freq: Once | ORAL | Status: DC | PRN

## 2023-09-19 MED ORDER — MORPHINE SULFATE (PF) 0.5 MG/ML IJ SOLN
INTRAMUSCULAR | Status: DC | PRN
  Administered 2023-09-19: 150 ug via INTRATHECAL

## 2023-09-19 MED ORDER — ACETAMINOPHEN 160 MG/5ML PO SOLN: 325.0000 mg | ORAL | Status: DC | PRN

## 2023-09-19 MED ORDER — MENTHOL 3 MG MT LOZG: 1.0000 | LOZENGE | OROMUCOSAL | Status: DC | PRN

## 2023-09-19 MED ORDER — PRENATAL MULTIVITAMIN CH
1.0000 | ORAL_TABLET | Freq: Every day | ORAL | Status: DC
  Administered 2023-09-19 – 2023-09-21 (×3): 1 via ORAL
  Filled 2023-09-19 (×3): qty 1

## 2023-09-19 MED ORDER — MEASLES, MUMPS & RUBELLA VAC IJ SOLR: 0.5000 mL | Freq: Once | INTRAMUSCULAR | Status: DC

## 2023-09-19 MED ORDER — SIMETHICONE 80 MG PO CHEW: 80.0000 mg | CHEWABLE_TABLET | ORAL | Status: DC | PRN

## 2023-09-19 MED ORDER — ONDANSETRON HCL 4 MG/2ML IJ SOLN: 4.0000 mg | Freq: Once | INTRAMUSCULAR | Status: DC | PRN

## 2023-09-19 MED ORDER — OXYCODONE HCL 5 MG PO TABS
5.0000 mg | ORAL_TABLET | ORAL | Status: DC | PRN
  Administered 2023-09-19 – 2023-09-21 (×7): 5 mg via ORAL
  Filled 2023-09-19 (×7): qty 1

## 2023-09-19 MED ORDER — LEVETIRACETAM 500 MG PO TABS
1000.0000 mg | ORAL_TABLET | Freq: Two times a day (BID) | ORAL | Status: DC
  Administered 2023-09-19 – 2023-09-21 (×4): 1000 mg via ORAL
  Filled 2023-09-19 (×5): qty 2

## 2023-09-19 MED ORDER — SIMETHICONE 80 MG PO CHEW
80.0000 mg | CHEWABLE_TABLET | Freq: Three times a day (TID) | ORAL | Status: DC
  Administered 2023-09-19 – 2023-09-21 (×7): 80 mg via ORAL
  Filled 2023-09-19 (×7): qty 1

## 2023-09-19 MED ORDER — OXYCODONE HCL 5 MG PO TABS: 5.0000 mg | ORAL_TABLET | Freq: Once | ORAL | Status: DC | PRN

## 2023-09-19 MED ORDER — OXYTOCIN-SODIUM CHLORIDE 30-0.9 UT/500ML-% IV SOLN: 2.5000 [IU]/h | INTRAVENOUS | Status: AC

## 2023-09-19 MED ORDER — IBUPROFEN 600 MG PO TABS
600.0000 mg | ORAL_TABLET | Freq: Four times a day (QID) | ORAL | Status: DC
Start: 2023-09-19 — End: 2023-09-22
  Administered 2023-09-19 – 2023-09-21 (×8): 600 mg via ORAL
  Filled 2023-09-19 (×9): qty 1

## 2023-09-19 MED ORDER — ACETAMINOPHEN 325 MG PO TABS: 325.0000 mg | ORAL_TABLET | ORAL | Status: DC | PRN

## 2023-09-19 MED ORDER — CEFAZOLIN SODIUM-DEXTROSE 2-4 GM/100ML-% IV SOLN
2.0000 g | INTRAVENOUS | Status: AC
  Administered 2023-09-19: 2 g via INTRAVENOUS

## 2023-09-19 MED ORDER — ZOLPIDEM TARTRATE 5 MG PO TABS: 5.0000 mg | ORAL_TABLET | Freq: Every evening | ORAL | Status: DC | PRN

## 2023-09-19 MED ORDER — DIBUCAINE (PERIANAL) 1 % EX OINT: 1.0000 | TOPICAL_OINTMENT | CUTANEOUS | Status: DC | PRN

## 2023-09-19 MED ORDER — FENTANYL CITRATE (PF) 100 MCG/2ML IJ SOLN: 25.0000 ug | INTRAMUSCULAR | Status: DC | PRN

## 2023-09-19 SURGICAL SUPPLY — 30 items
BENZOIN TINCTURE PRP APPL 2/3 (GAUZE/BANDAGES/DRESSINGS) ×1 IMPLANT
CHLORAPREP W/TINT 26 (MISCELLANEOUS) ×2 IMPLANT
CLAMP UMBILICAL CORD (MISCELLANEOUS) ×1 IMPLANT
CLOTH BEACON ORANGE TIMEOUT ST (SAFETY) ×1 IMPLANT
DERMABOND ADVANCED .7 DNX12 (GAUZE/BANDAGES/DRESSINGS) IMPLANT
DRSG OPSITE POSTOP 4X10 (GAUZE/BANDAGES/DRESSINGS) ×1 IMPLANT
ELECT REM PT RETURN 9FT ADLT (ELECTROSURGICAL) ×1 IMPLANT
ELECTRODE REM PT RTRN 9FT ADLT (ELECTROSURGICAL) ×1 IMPLANT
EXTRACTOR VACUUM M CUP 4 TUBE (SUCTIONS) IMPLANT
GAUZE SPONGE 4X4 12PLY STRL LF (GAUZE/BANDAGES/DRESSINGS) IMPLANT
GLOVE BIOGEL PI IND STRL 7.0 (GLOVE) ×2 IMPLANT
GLOVE BIOGEL PI IND STRL 7.5 (GLOVE) ×2 IMPLANT
GLOVE ECLIPSE 7.5 STRL STRAW (GLOVE) ×1 IMPLANT
GOWN STRL REUS W/TWL LRG LVL3 (GOWN DISPOSABLE) ×3 IMPLANT
KIT ABG SYR 3ML LUER SLIP (SYRINGE) IMPLANT
MAT PREVALON FULL STRYKER (MISCELLANEOUS) IMPLANT
NDL HYPO 25X5/8 SAFETYGLIDE (NEEDLE) IMPLANT
NEEDLE HYPO 25X5/8 SAFETYGLIDE (NEEDLE) IMPLANT
NS IRRIG 1000ML POUR BTL (IV SOLUTION) ×1 IMPLANT
PACK C SECTION WH (CUSTOM PROCEDURE TRAY) ×1 IMPLANT
PAD OB MATERNITY 4.3X12.25 (PERSONAL CARE ITEMS) ×1 IMPLANT
RTRCTR C-SECT PINK 25CM LRG (MISCELLANEOUS) ×1 IMPLANT
STRIP CLOSURE SKIN 1/2X4 (GAUZE/BANDAGES/DRESSINGS) ×1 IMPLANT
SUT PLAIN ABS 2-0 CT1 27XMFL (SUTURE) IMPLANT
SUT VIC AB 0 CT1 36 (SUTURE) ×3 IMPLANT
SUT VIC AB 2-0 CT1 TAPERPNT 27 (SUTURE) ×1 IMPLANT
SUT VIC AB 4-0 KS 27 (SUTURE) ×1 IMPLANT
TOWEL OR 17X24 6PK STRL BLUE (TOWEL DISPOSABLE) ×1 IMPLANT
TRAY FOLEY W/BAG SLVR 14FR LF (SET/KITS/TRAYS/PACK) ×1 IMPLANT
WATER STERILE IRR 1000ML POUR (IV SOLUTION) ×1 IMPLANT

## 2023-09-19 NOTE — H&P (Signed)
 LABOR AND DELIVERY ADMISSION HISTORY AND PHYSICAL NOTE  Brittany Hendrix is a 33 y.o. female [id number] with IUP at [redacted]w[redacted]d by L/9 presenting for scheduled repeat cesarean.   She reports positive fetal movement. She denies leakage of fluid or vaginal bleeding.  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 [ ]  Inform patient Per up-to-date "considered to be a normal variant. Patients are asymptomatic, have no compromise of fertility, and similar pregnancy outcomes as those in the general obstetric population. "    Headache(784.0)    Seizures (HCC)    Last seizure May 25 2020    Past Surgical History: Past Surgical History:  Procedure Laterality Date   CESAREAN SECTION N/A 05/20/2020   Procedure: CESAREAN SECTION;  Surgeon: Tereso Newcomer, MD;  Location: [facility];  Service: Obstetrics;  Laterality: N/A;   CESAREAN SECTION N/A 12/23/2021   Procedure: CESAREAN SECTION;  Surgeon: Kathrynn Running, MD;  Location: [facility];  Service: Obstetrics;  Laterality: N/A;   DILATION AND CURETTAGE OF UTERUS     abortion   DILATION AND EVACUATION N/A 10/04/2017   Procedure: DILATATION AND EVACUATION;  Surgeon: Silverio Lay, MD;  Location: [facility] ORS;  Service: Gynecology;  Laterality: N/A;   DILATION AND EVACUATION N/A 12/23/2020   Procedure: DILATATION AND EVACUATION;  Surgeon: Adam Phenix, MD;  Location[facility] OR;  Service: Gynecology;  Laterality: N/A;   OPERATIVE ULTRASOUND N/A 12/23/2020   Procedure: OPERATIVE ULTRASOUND;  Surgeon: Adam Phenix, MD;  Location: [facility] OR;  Service: Gynecology;  Laterality: N/A;    Obstetrical History: OB History     Gravida  10   Para  3   Term  3   Preterm  0   AB  6   Living  3      SAB  5   IAB  1   Ectopic  0   Multiple  0   Live Births  3           Social History: Social History   Socioeconomic History   Marital status:  Single    Spouse name: Not on file   Number of children: 3   Years of education: 12   Highest education level: Not on file  Occupational History   Occupation: Teacher, English as a foreign language  Tobacco Use   Smoking status: Every Day    Current packs/day: 0.50    Average packs/day: 0.5 packs/day for 5.0 years (2.5 ttl pk-yrs)    Types: Cigarettes    Passive exposure: Past   Smokeless tobacco: Never  Vaping Use   Vaping status: Never Used  Substance and Sexual Activity   Alcohol use: Yes    Comment: occasional, not while preg   Drug use: Not Currently    Types: Marijuana    Comment: Occasional 3 times per month   Sexual activity: Yes    Partners: Male    Birth control/protection: None  Other Topics Concern   Not on file  Social History Narrative   Patient is single and lives at home alone. Patient works at [employer]. Right handed. Caffeine three daily.   Social Drivers of Corporate investment banker Strain: Not on file  Food Insecurity: No Food Insecurity (09/19/2023)   Hunger Vital Sign    Worried About Running Out of Food in the Last Year: Never true    Ran Out of Food in the  Last Year: Never true  Transportation Needs: No Transportation Needs (09/19/2023)   PRAPARE - Administrator, Civil Service (Medical): No    Lack of Transportation (Non-Medical): No  Physical Activity: Not on file  Stress: Not on file  Social Connections: Patient Declined (09/19/2023)   Social Connection and Isolation Panel [NHANES]    Frequency of Communication with Friends and Family: Patient declined    Frequency of Social Gatherings with Friends and Family: Patient declined    Attends Religious Services: Patient declined    Database administrator or Organizations: Patient declined    Attends Engineer, structural: Patient declined    Marital Status: Patient declined    Family History: Family History  Problem Relation Age of Onset   Diabetes Mother    Other Father        Hx unknown    Asthma Brother    Colon cancer Neg Hx    Esophageal cancer Neg Hx    Rectal cancer Neg Hx     Allergies: No Known Allergies  Medications Prior to Admission  Medication Sig Dispense Refill Last Dose/Taking   acetaminophen (TYLENOL) 500 MG tablet Take 1,000 mg by mouth every 6 (six) hours as needed (pain.).   Past Week   Ca Carbonate-Mag Hydroxide (ROLAIDS EXTRA STRENGTH) 675-135 MG CHEW Chew 1-2 tablets by mouth 3 (three) times daily as needed (indigestion/heartburn.).   Past Week   calcium carbonate (TUMS EX) 750 MG chewable tablet Chew 1-2 tablets by mouth 2 (two) times daily as needed for heartburn.   Past Week   diphenhydramine-acetaminophen (TYLENOL PM) 25-500 MG TABS tablet Take 1 tablet by mouth at bedtime as needed (sleep).   Past Week   levETIRAcetam (KEPPRA) 1000 MG tablet Take 1 tablet (1,000 mg total) by mouth 2 (two) times daily. 180 tablet 3 09/19/2023 Morning   Midazolam (NAYZILAM) 5 MG/0.1ML SOLN For seizure use 5 mg (1 spray) as a single dose in 1 nostril; may repeat same dose in 10 minutes in alternate nostril based on response and tolerability (do not repeat if the patient is having trouble breathing or excessive sedation). Maximum dose: 10 mg (2 sprays) per single episode. 2 each 1 Taking     Review of Systems   All systems reviewed and negative except as stated in HPI  Blood pressure 108/71, pulse 94, temperature 97.7 F (36.5 C), temperature source Oral, resp. rate 17, height 5\' 4"  (1.626 m), weight 100.2 kg, last menstrual period 12/20/2022, SpO2 96%, not currently breastfeeding. General appearance: alert, cooperative, and appears stated age Lungs: clear to auscultation bilaterally Heart: regular rate and rhythm Abdomen: soft, non-tender; bowel sounds normal Extremities: No calf swelling or tenderness Presentation: cephalic Fetal monitoring: 140s Uterine activity: quiet     Prenatal labs: ABO, Rh: --/--/A POS (02/24 1610) Antibody: NEG (02/24  0942) Rubella: 4.16 (08/22 1139) RPR: NON REACTIVE (02/24 1000)  HBsAg: Negative (08/22 1139)  HIV: Non Reactive (12/30 0916)  GBS: Negative/-- (01/29 0931)  2 hr Glucola: wnl Genetic screening:  wnl Anatomy US: wnl  Prenatal Transfer Tool  Maternal Diabetes: No Genetic Screening: Normal Maternal Ultrasounds/Referrals: poly Fetal Ultrasounds or other Referrals:  None Maternal Substance Abuse:  No Significant Maternal Medications:  None Significant Maternal Lab Results: Group B Strep negative  Results for orders placed or performed during the hospital encounter of 09/18/23 (from the past 24 hours)  Type and screen   Collection Time: 09/18/23  9:42 AM  Result Value Ref Range  ABO/RH(D) A POS    Antibody Screen NEG    Sample Expiration      09/21/2023,2359 Performed at [facility], [address] [place], [place] [address]   CBC   Collection Time: 09/18/23 10:00 AM  Result Value Ref Range   WBC 6.4 4.0 - 10.5 K/uL   RBC 3.50 (L) 3.87 - 5.11 MIL/uL   Hemoglobin 10.9 (L) 12.0 - 15.0 g/dL   HCT 60.4 (L) 54.0 - 98.1 %   MCV 94.3 80.0 - 100.0 fL   MCH 31.1 26.0 - 34.0 pg   MCHC 33.0 30.0 - 36.0 g/dL   RDW 19.1 47.8 - 29.5 %   Platelets 197 150 - 400 K/uL   nRBC 0.0 0.0 - 0.2 %  RPR   Collection Time: 09/18/23 10:00 AM  Result Value Ref Range   RPR Ser Ql NON REACTIVE NON REACTIVE    Patient Active Problem List   Diagnosis Date Noted   Pregnancy 09/19/2023   History of 2 cesarean sections 03/16/2023   H/O placenta previa 03/16/2023   Supervision of high risk pregnancy, antepartum 02/21/2023   History of molar pregnancy 06/16/2021   Previous cesarean delivery affecting pregnancy 06/16/2021   Obesity during pregnancy, antepartum 01/02/2020   Nonintractable epilepsy without status epilepticus (HCC) 04/17/2017   Obesity (BMI 35.0-39.9 without comorbidity) 12/25/2015   Seizure disorder in pregnancy, antepartum (HCC) 05/28/2015   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 A21H0865 at [redacted]w[redacted]d here for rltcs. With one prior vaginal delivery I shared with the patient that she is a reasonable candidate for tolac and we reviewed the risks and benefits of tolac vs scheduled repeat cesarean. The patient affirms her desire to proceed with a repeat cesarean section.  The risks of cesarean section were discussed with the patient including but were not limited to: bleeding which may require transfusion or reoperation; infection which may require antibiotics; injury to bowel, bladder, ureters or other surrounding organs; injury to the fetus; need for additional procedures including hysterectomy in the event of a life-threatening hemorrhage; placental abnormalities wth subsequent pregnancies, incisional problems, thromboembolic phenomenon and other postoperative/anesthesia complications.   Patient has been NPO since midnight she will remain NPO for procedure. Anesthesia and OR aware.  Preoperative prophylactic antibiotics and SCDs ordered on call to the OR.  To OR when ready. TXA ordered as ppx as this will be patient's third section.  # Seizure disorder: on kepra 1000 mg before and during pregnancy, followed by neuro, plan to continue that postpartum.  # Hx molar pregnancy: placenta to path  # Contraception: desires post-placental lng-iud, is aware of risk of expulsion and strings that don't descend  # poly: noted, mild  # circ: yes, inpatient Preprocedural Counseling: Parent desires circumcision for this female infant.  Circumcision procedure details discussed, risks and benefits of procedure were also discussed.  The benefits include but are not limited to: reduction in the rates of urinary tract infection (UTI), penile cancer, sexually transmitted infections including HIV, penile inflammatory and retractile disorders.   Risks include but are not limited to: bleeding, infection, injury of  glans which may lead to penile deformity or urinary tract issues or Urology intervention, unsatisfactory cosmetic appearance and other potential complications related to the procedure.  It was emphasized that this is an elective procedure.    # feeding: breast/bottle    Silvano Bilis 09/19/2023, 9:07 AM

## 2023-09-19 NOTE — Discharge Summary (Signed)
 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 yo [id number] at [redacted]w[redacted]d was admitted to the hospital 09/19/2023 for scheduled cesarean section with the following indication: Elective Repeat.Delivery details are as follows:  Membrane Rupture Time/Date: 9:54 AM,09/19/2023  Delivery Method:C-Section, Low Transverse Operative Delivery:N/A Details of operation can be found in separate operative note.  Patient had a postpartum course complicated by***.  She is ambulating, tolerating a regular diet, passing flatus, and urinating well. Patient is discharged home in stable condition on  09/19/23        Newborn Data: Birth date:09/19/2023 Birth time:9:54 AM Gender:Female Living status:Living Apgars:9 ,9  Weight:3090 g    Magnesium Sulfate received: No BMZ received: No Rhophylac:No MMR:No T-DaP: Declined Flu: No RSV Vaccine received:  No Transfusion:{Transfusion received:30440034}  Immunizations received: Immunization History  Administered Date(s) Administered   Tdap 11/14/2014, 12/25/2020    Physical exam  Vitals:   09/19/23 0758 09/19/23 0816  BP:  108/71  Pulse:  94  Resp:  17  Temp:  97.7 F (36.5 C)  TempSrc:  Oral  SpO2:  96%  Weight: 100.2 kg   Height: 5\' 4"  (1.626 m)    General: {Exam; general:21111117} Lochia: {Desc; appropriate/inappropriate:30686::"appropriate"} Uterine Fundus: {Desc; firm/soft:30687} Incision: {Exam; incision:21111123} DVT Evaluation: {Exam; dvt:2111122} Labs: Lab Results  Component Value Date   WBC 6.4 09/18/2023   HGB 10.9 (L) 09/18/2023   HCT 33.0 (L) 09/18/2023   MCV 94.3 09/18/2023   PLT 197 09/18/2023      Latest Ref Rng & Units 08/22/2023    9:23 AM  CMP  Glucose 70 - 99 mg/dL 80   BUN 6 - 20 mg/dL 5   Creatinine 4.78 - 2.95 mg/dL 6.21   Sodium 308 - 657 mmol/L 134   Potassium 3.5 - 5.1 mmol/L 3.5   Chloride 98 - 111 mmol/L 104   CO2 22 - 32 mmol/L 19   Calcium 8.9 - 10.3 mg/dL 8.3   Total Protein 6.5 - 8.1 g/dL 6.3   Total Bilirubin 0.0 - 1.2 mg/dL 1.6   Alkaline Phos 38 - 126 U/L 110   AST 15 - 41 U/L 18   ALT 0 - 44 U/L 16    Edinburgh Score:    02/03/2022    1:40 PM  Edinburgh Postnatal Depression Scale Screening Tool  I have been able  to laugh and see the funny side of things. 0  I have looked forward with enjoyment to things. 0  I have blamed myself unnecessarily when things went wrong. 0  I have been anxious or worried for no good reason. 0  I have felt scared or panicky for no good reason. 0  Things have been getting on top of me. 0  I have been so unhappy that I have had difficulty sleeping. 0  I have felt sad or miserable. 0  I have been so unhappy that I have been crying. 0  The thought of harming myself has occurred to me. 0  Edinburgh Postnatal Depression Scale Total 0   No data recorded  After visit meds:  Allergies as of  09/19/2023   No Known Allergies   Med Rec must be completed prior to using this [facility]***        Discharge home in stable condition Infant Feeding: {Baby feeding:23562} Infant Disposition:{CHL IP OB HOME WITH ZOXWRU:04540} Discharge instruction: per After Visit Summary and Postpartum booklet. Activity: Advance as tolerated. Pelvic rest for 6 weeks.  Diet: {OB JWJX:91478295} Future Appointments: Future Appointments  Date Time Provider Department Center  01/03/2024  4:00 PM Glean Salvo, NP [facility] None   Follow up Visit:  Message sent to [facility] 2/25  Please schedule this patient for a In person postpartum visit in 6 weeks with the following provider: Any provider. Additional Postpartum F/U:Incision check 1 week  High risk pregnancy complicated by:  seizure disorder, hx previa Delivery mode:  C-Section, Low Transverse Anticipated Birth Control:  PP IUD placed   09/19/2023 Joanne Gavel, MD

## 2023-09-19 NOTE — Op Note (Signed)
 Cesarean Section Operative Report  Brittany Hendrix  09/19/2023  Indications: history 2 prior cesareans, maternal request (declined tolac), undesired fertility  Pre-operative Diagnosis: repeat low transverse cesarean section, placement of post-placental lng-iud (Mirena)  Post-operative Diagnosis: Same   Surgeon: Surgeons and Role:    * Kinlee Garrison, Wilfred Curtis, MD - Primary    * Joanne Gavel, MD - Assisting   Attending Attestation: I was present and scrubbed for the entire procedure.   An experienced assistant was required given the standard of surgical care given the complexity of the case.  This assistant was needed for exposure, dissection, suctioning, retraction, instrument exchange, assisting with delivery with administration of fundal pressure, and for overall help during the procedure.  Anesthesia: spinal    Quantified Blood Loss: 356 ml  Total IV Fluids: 2000 ml LR  Urine Output:: 50 ml clear yellow urine  Specimens: placenta to pathology (history molar pregnancy)  Findings: Viable female infant in cephalic presentation; Apgars pending; weight pending; arterial cord pH not obtained;  copious clear amniotic fluid; intact placenta with three vessel cord; normal uterus, fallopian tubes and ovaries bilaterally.  Baby condition / location:  Couplet care / Skin to Skin   Complications: no complications. Mild adhesions anterior to the rectus. No significant adhesive disease anterior to the uterus.  Indications: Brittany Hendrix is a 33 y.o. [id number] with an IUP [redacted]w[redacted]d presenting for scheduled repeat cesarean.  The risks, benefits, complications, treatment options, and exected outcomes were discussed with the patient . The patient dwith the proposed plan, giving informed consent. identified as Brittany Hendrix and the procedure verified as C-Section Delivery.  Procedure Details:  The patient was taken back to the operative suite where spinal anesthesia was placed.  A time  out was held and the above information confirmed.   After induction of anesthesia, the patient was draped and prepped in the usual sterile manner and placed in a dorsal supine position with a leftward tilt. A Pfannenstiel incision was made and carried down through the subcutaneous tissue to the fascia. Fascial incision was made and sharply extended transversely. The fascia was separated from the underlying rectus tissue superiorly and inferiorly. The peritoneum was identified and sharply entered and extended longitudinally. Alexis retractor was placed. A bladder flap was created. A low transverse uterine incision was made and extended bluntly. Delivered from cephalic presentation was a viable infant with Apgars and weight as above.  After waiting 60 seconds for delayed cord cutting, the umbilical cord was clamped and cut cord blood was obtained for evaluation. Cord ph was not sent. The placenta was removed Intact and appeared normal. The uterine outline, tubes and ovaries appeared normal. The uterine incision was closed with running unlocked sutures 0-Vicryl in one layer.  Mid-way through the closure we grasped the Mirena IUD and placed it at the fundus and then tucked the strings down into the cervix. After closure of the hysterotomy, hemostasis was observed. The peritoneum was closed with 2-0 vicryl. The rectus muscles were examined and hemostasis observed. The fascia was then reapproximated with running sutures of 0-Vicryl. Upon finishing we discovered some laxity in the closure, so 0 vicryl was used to reinforce the closure in a running fashion. The subcuticular closure was performed with 2-0 plain gut. The skin was closed with 4-0 Vicryl.  Instrument, sponge, and needle counts were correct prior the abdominal closure and were correct at the conclusion of the case.     Disposition: PACU - hemodynamically stable.  Maternal Condition: stable       Signed: Silvano Bilis, MD 09/19/2023 10:31 AM

## 2023-09-19 NOTE — Anesthesia Postprocedure Evaluation (Signed)
 Anesthesia Post Note  Patient: Brittany Hendrix  Procedure(s) Performed: CESAREAN SECTION INTRAUTERINE DEVICE (IUD) INSERTION     Anesthesia Type: Spinal Anesthetic complications: no   No notable events documented.  Last Vitals:  Vitals:   09/19/23 1130 09/19/23 1146  BP: 103/60 (!) 102/58  Pulse: (!) 58   Resp: 12 (!) 22  Temp:    SpO2: 99% 99%    Last Pain:  Vitals:   09/19/23 1146  TempSrc:   PainSc: 0-No pain   Pain Goal:    LLE Motor Response: Purposeful movement (09/19/23 1146) LLE Sensation: Tingling (09/19/23 1146) RLE Motor Response: Purposeful movement (09/19/23 1146) RLE Sensation: Tingling (09/19/23 1146)     Epidural/Spinal Function Cutaneous sensation: Able to Wiggle Toes (09/19/23 1146), Patient able to flex knees: Yes (09/19/23 1146), Patient able to lift hips off bed: No (09/19/23 1146), Back pain beyond tenderness at insertion site: No (09/19/23 1146), Progressively worsening motor and/or sensory loss: No (09/19/23 1146), Bowel and/or bladder incontinence post epidural: No (09/19/23 1146)  Mayanna Garlitz

## 2023-09-19 NOTE — Anesthesia Procedure Notes (Signed)
 Spinal  Patient location during procedure: OR Start time: 09/19/2023 9:27 AM End time: 09/19/2023 9:30 AM Reason for block: surgical anesthesia Staffing Performed: other anesthesia staff  Anesthesiologist: Bethena Midget, MD Performed by: Bethena Midget, MD Authorized by: Bethena Midget, MD   Preanesthetic Checklist Completed: patient identified, IV checked, site marked, risks and benefits discussed, surgical consent, monitors and equipment checked, pre-op evaluation and timeout performed Spinal Block Patient position: sitting Prep: DuraPrep Patient monitoring: heart rate, cardiac monitor, continuous pulse ox and blood pressure Approach: midline Location: L3-4 Injection technique: single-shot Needle Needle type: Sprotte  Needle gauge: 24 G Needle length: 9 cm Assessment Sensory level: T4 Events: CSF return

## 2023-09-19 NOTE — Transfer of Care (Signed)
 Immediate Anesthesia Transfer of Care Note  Patient: Brittany Hendrix  Procedure(s) Performed: CESAREAN SECTION INTRAUTERINE DEVICE (IUD) INSERTION  Patient Location: PACU  Anesthesia Type:Spinal  Level of Consciousness: awake, alert , and oriented  Airway & Oxygen Therapy: Patient Spontanous Breathing  Post-op Assessment: Report given to RN and Post -op Vital signs reviewed and stable  Post vital signs: Reviewed and stable  Last Vitals:  Vitals Value Taken Time  BP 93/60 09/19/23 1051  Temp    Pulse 62 09/19/23 1054  Resp 16 09/19/23 1054  SpO2 99 % 09/19/23 1054  Vitals shown include unfiled device data.  Last Pain:  Vitals:   09/19/23 0816  TempSrc: Oral  PainSc: 0-No pain         Complications: No notable events documented.

## 2023-09-20 LAB — CBC
HCT: 27.2 % — ABNORMAL LOW (ref 36.0–46.0)
Hemoglobin: 9.3 g/dL — ABNORMAL LOW (ref 12.0–15.0)
MCH: 32 pg (ref 26.0–34.0)
MCHC: 34.2 g/dL (ref 30.0–36.0)
MCV: 93.5 fL (ref 80.0–100.0)
Platelets: 191 10*3/uL (ref 150–400)
RBC: 2.91 MIL/uL — ABNORMAL LOW (ref 3.87–5.11)
RDW: 13.2 % (ref 11.5–15.5)
WBC: 11.4 10*3/uL — ABNORMAL HIGH (ref 4.0–10.5)
nRBC: 0 % (ref 0.0–0.2)

## 2023-09-20 MED ORDER — FERROUS SULFATE 325 (65 FE) MG PO TABS
325.0000 mg | ORAL_TABLET | ORAL | Status: DC
  Administered 2023-09-20: 325 mg via ORAL
  Filled 2023-09-20: qty 1

## 2023-09-20 NOTE — Progress Notes (Signed)
 POSTPARTUM PROGRESS NOTE  POD #1  Subjective:  Brittany Hendrix is a 33 y.o. [id number] s/p repeat LTCS and post placental mirena IUD insertion at [redacted]w[redacted]d.  No acute events overnight. She reports she is doing well. She denies any problems with ambulating, voiding or po intake. Denies nausea or vomiting. Pain is well controlled.  Lochia is minimal.  Objective: BP (!) 90/45 (BP Location: Right Arm)   Pulse (!) 56   Temp 97.6 F (36.4 C) (Oral)   Resp 18   Ht 5\' 4"  (1.626 m)   Wt 100.2 kg   LMP 12/20/2022 (Exact Date)   SpO2 99%   Breastfeeding Unknown   BMI 37.90 kg/m   Physical Exam:  General: alert, cooperative and no distress Chest: no respiratory distress Heart:regular rate, distal pulses intact Abdomen: soft, nontender,  Uterine Fundus: firm, appropriately tender DVT Evaluation: No calf swelling or tenderness Extremities: no LE edema Skin: warm, dry; incision clean/dry/intact w/ honeycomb dressing in place  Recent Labs    09/18/23 1000 09/20/23 0427  HGB 10.9* 9.3*  HCT 33.0* 27.2*    Assessment/Plan: Brittany Hendrix is a 33 y.o. [id number] s/p repeat LTCS and post placental IUD insertion at [redacted]w[redacted]d.  POD#1 - Doing welll; pain well controlled. H/H appropriate  Routine postpartum care  OOB, ambulated  Lovenox for VTE prophylaxis Anemia: asymptomatic, start PO iron every other day Seizure disorder: stable, continue Keppra 1g BID Contraception: post placental Mirena done at time of cesarean Feeding: bottle  Dispo: Plan for discharge POD#2-3 pending course.   LOS: 1 day   Venora Maples, MD/MPH Attending Family Medicine Physician, [facility] for [facility], [facility]  09/20/2023, 10:04 AM

## 2023-09-20 NOTE — Progress Notes (Signed)
   Circumcision Consent   Discussed with mom at bedside about circumcision.    Circumcision is a surgery that removes the skin that covers the tip of the penis, called the "foreskin." Circumcision is usually done when a boy is between 63 and 62 days old, sometimes up to 21-50 weeks old.   The most common reasons boys are circumcised include for cultural/religious beliefs or for parental preference (potentially easier to clean, so baby looks like daddy, etc).   There may be some medical benefits for circumcision:    Circumcised boys seem to have slightly lower rates of: ? Urinary tract infections (per the American Academy of Pediatrics an uncircumcised boy has a 1/100 chance of developing a UTI in the first year of life, a circumcised boy at a 07/998 chance of developing a UTI in the first year of life- a 10% reduction) ? Penis cancer (typically rare- an uncircumcised female has a 1 in 100,000 chance of developing cancer of the penis) ? Sexually transmitted infection (in endemic areas, including HIV, HPV and Herpes- circumcision does NOT protect against gonorrhea, chlamydia, trachomatis, or syphilis) ? Phimosis: a condition where that makes retraction of the foreskin over the glans impossible (0.4 per 1000 boys per year or 0.6% of boys are affected by their 15th birthday)   Boys and men who are not circumcised can reduce these extra risks by: ? Cleaning their penis well ? Using condoms during sex   What are the risks of circumcision?   As with any surgical procedure, there are risks and complications. In circumcision, complications are rare and usually minor, the most common being: ? Bleeding- risk is reduced by holding each clamp for 30 seconds prior to a cut being made, and by holding pressure after the procedure is done ? Infection- the penis is cleaned prior to the procedure, and the procedure is done under sterile technique ? Damage to the urethra or amputation of the penis   How is  circumcision done in baby boys?   The baby will be placed on a special table and the legs restrained for their safety. Numbing medication is injected into the penis, and the skin is cleansed with betadine to decrease the risk of infection.    What to expect:   The penis will look red and raw for 5-7 days as it heals. We expect scabbing around where the cut was made, as well as clear-pink fluid and some swelling of the penis right after the procedure. If your baby's circumcision starts to bleed or develops pus, please contact your pediatrician immediately.   All questions were answered and mother consented.      Claria Dice, MD Family Medicine Resident, PGY-1 09/20/2023 9:47 AM

## 2023-09-21 LAB — SURGICAL PATHOLOGY

## 2023-09-21 MED ORDER — OXYCODONE HCL 5 MG PO TABS: 5.0000 mg | ORAL_TABLET | ORAL | 0 refills | Status: AC | PRN

## 2023-09-21 MED ORDER — FERROUS SULFATE 325 (65 FE) MG PO TABS
325.0000 mg | ORAL_TABLET | ORAL | 3 refills | Status: AC
Start: 2023-09-22 — End: ?

## 2023-09-21 MED ORDER — IBUPROFEN 600 MG PO TABS
600.0000 mg | ORAL_TABLET | Freq: Four times a day (QID) | ORAL | 0 refills | Status: AC | PRN
Start: 2023-09-21 — End: ?

## 2023-09-26 ENCOUNTER — Ambulatory Visit: Payer: Medicaid Other

## 2023-09-26 NOTE — Progress Notes (Signed)
 Subjective:     Brittany Hendrix is a 33 y.o. female who presents to the clinic 1 weeks status post low uterine, transverse cesarean section. Pt reports incision is healing well. Honeycomb dressing removed today.     Objective:    LMP 12/20/2022 (Exact Date)  General:  alert, well appearing, in no apparent distress  Incision:   healing well, no drainage, no erythema, no hernia, no seroma, no swelling, no dehiscence, incision well approximated     Assessment:   Doing well postoperatively.   Plan:   1. Continue any current medications. 2. Wound care discussed. 3. Follow up: PP visit.  Gavin Potters, RN

## 2023-10-31 ENCOUNTER — Ambulatory Visit: Payer: Medicaid Other | Admitting: Obstetrics and Gynecology

## 2024-01-03 ENCOUNTER — Telehealth: Payer: Self-pay | Admitting: Neurology

## 2024-01-03 ENCOUNTER — Telehealth: Payer: Medicaid Other | Admitting: Neurology

## 2024-01-03 NOTE — Progress Notes (Deleted)
 Patient: Brittany Hendrix Date of Birth: February 04, 1991  Reason for Visit: Follow up History from: Patient Primary Neurologist: Gracie Lav  Virtual Visit via Video Note  I connected with Brittany Hendrix on 01/03/24 at  4:00 PM EDT by a video enabled telemedicine application and verified that I am speaking with the correct person using two identifiers.  Location: Patient: at her home Provider: at my home    I discussed the limitations of evaluation and management by telemedicine and the availability of in person appointments. The patient expressed understanding and agreed to proceed.  ASSESSMENT AND PLAN 33 y.o. year old female   1.  Epilepsy 2.  Current pregnancy, 38 weeks, scheduled C-section 09/19/2023 -Denies any issues with pregnancy thus far, not planning to breastfeed  -Mentions last seizure was in April 2024, missed a dose of Keppra  -For now, continue Keppra  1000 mg twice daily, continue prenatal vitamin with folic acid  1 mg. Discussed importance of medication compliance. -We discussed switching to Keppra  extended release to promote better medication compliance once she delivers the baby.  We will schedule a 14-month follow-up to discuss. She isn't sure she wants to switch -I placed an order to have a Keppra  level drawn when she goes to the hospital next week for labs to get a baseline -I will send in Nayzilam  5 mg inhaler as needed in the event of acute seizure.  Will need to have someone else administer we discussed  -Call for seizures, I will see her in 3 months for a virtual visit.  She does not have a driver's license.  No orders of the defined types were placed in this encounter.  No orders of the defined types were placed in this encounter.  HISTORY  Brittany Hendrix is a 33 years old female, with epilepsy disorder, currently taking Keppra  500 in the morning, 1000 mg in the evening.    She had seizures since she was 73-75 years old.  All seizure has similar seminology.  She has an aura before those seizures, she feels drowsy and sometimes nauseated, lasting few minutes, followed by loss of conciousness, whole body shaking, then post ictal confusion usually last 20-30 minutes..    She has injured herself in the face and on her leg  during seizures in the past. She was not treated with anti-epilepsic medications, until after she had a recurrent seizure in May 2012, she was put on Levetiracetam  500 mg b.i.d.    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  her bloodwork tested positive for marijuana.    For a while, she was noncompliant with her medications, also concerned about the medication costs, she continue have recurrent seizures.    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 EEG was normal   She had recurrent seizure in May 2014, 2 seizures in 1 day while taking Keppra  500 twice daily, dosage was increased to 500/1000.     For a while, she struggled to pay for her medications,    Recurrent seizure September 17, 2015 while she was [redacted] weeks pregnant, preceded by body jerking movement, while taking Keppra  1500 mg daily   Then April 03, 2017, May 24, 2020 following hollowing party, with excessive alcohol use, sleep deprived,   She had 2 previous pregnancy with normal children taking  Keppra  1000 twice a day, does not want to increase her dosage now,  Last reported seizure June 2022 missing her morning dose of medications, Keppra  level Nov 25, 2019 was 26.9   Update Dec 07, 2021 SS: Here today for virtual visit, is [redacted] weeks pregnant, scheduled for C-section due to placenta previa 01/06/22.  Remains on Keppra  1000 mg twice daily.  Denies any seizure activity.  Never had labs done for Keppra  level.  Does not wish to increase the dose.  Transportation is an  issue for her.  Update June 15, 2022 SS: Has 66 month old son, was induced for Placenta Previa, had c-section, baby is doing well. Is not breastfeeding. No seizures. Remains on Keppra  1000 mg twice daily. She doesn't drive. Works at [employer]. Is taking birth control pills. She did not take her Keppra  today.   Update September 13, 2023 SS: Has not been seen since Nov 2023, Keppra  level was 19.8. Currently pregnant, scheduled c-section 09/19/23. On Keppra  1000 mg BID. Has been a good pregnancy. Having a baby boy, healthy thus far. No seizures during pregnancy. Never had Keppra  level checked when I ordered. She doesn't drive due to not having a drivers license. Claims last seizure was April 2024, missed her medication, happened midday, felt the seizure coming on, bit her cheek, generalized event, significant other was there. This is her 4th baby.  Remains on prenatal vitamin with folic acid .  Update January 03, 2024 SS:   REVIEW OF SYSTEMS: Out of a complete 14 system review of symptoms, the patient complains only of the following symptoms, and all other reviewed systems are negative.  See HPI  ALLERGIES: No Known Allergies  HOME MEDICATIONS: Outpatient Medications Prior to Visit  Medication Sig Dispense Refill   ferrous sulfate  325 (65 FE) MG tablet Take 1 tablet (325 mg total) by mouth every other day. 30 tablet 3   ibuprofen  (ADVIL ) 600 MG tablet Take 1 tablet (600 mg total) by mouth every 6 (six) hours as needed. 30 tablet 0   levETIRAcetam  (KEPPRA ) 1000 MG tablet Take 1 tablet (1,000 mg total) by mouth 2 (two) times daily. 180 tablet 3   Midazolam  (NAYZILAM ) 5 MG/0.1ML SOLN For seizure use 5 mg (1 spray) as a single dose in 1 nostril; may repeat same dose in 10 minutes in alternate nostril based on response and tolerability (do not repeat if the patient is having trouble breathing or excessive sedation). Maximum dose: 10 mg (2 sprays) per single episode. (Patient not taking: Reported on  09/26/2023) 2 each 1   oxyCODONE  (OXY IR/ROXICODONE ) 5 MG immediate release tablet Take 1 tablet (5 mg total) by mouth every 4 (four) hours as needed for moderate pain (pain score 4-6). 30 tablet 0   No facility-administered medications prior to visit.    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 [ ]  Inform patient Per up-to-date considered to be a normal variant. Patients are asymptomatic, have no compromise of fertility, and similar pregnancy outcomes as those in the general obstetric population.     Headache(784.0)    Seizures (HCC)    Last seizure May 25 2020    PAST SURGICAL HISTORY: Past Surgical History:  Procedure Laterality Date   CESAREAN SECTION N/A 05/20/2020   Procedure: CESAREAN SECTION;  Surgeon: Julianne Octave, MD;  Location: [facility];  Service: Obstetrics;  Laterality: N/A;   CESAREAN SECTION N/A 12/23/2021  Procedure: CESAREAN SECTION;  Surgeon: Janeane Mealy, MD;  Location: [facility];  Service: Obstetrics;  Laterality: N/A;   CESAREAN SECTION N/A 09/19/2023   Procedure: CESAREAN SECTION;  Surgeon: Janeane Mealy, MD;  Location: [facility];  Service: Obstetrics;  Laterality: N/A;   DILATION AND CURETTAGE OF UTERUS     abortion   DILATION AND EVACUATION N/A 10/04/2017   Procedure: DILATATION AND EVACUATION;  Surgeon: Ona Bidding, MD;  Location: [facility];  Service: Gynecology;  Laterality: N/A;   DILATION AND EVACUATION N/A 12/23/2020   Procedure: DILATATION AND EVACUATION;  Surgeon: Tresia Fruit, MD;  Location: [facility];  Service: Gynecology;  Laterality: N/A;   INTRAUTERINE DEVICE (IUD) INSERTION N/A 09/19/2023   Procedure: INTRAUTERINE DEVICE (IUD) INSERTION;  Surgeon: Janeane Mealy, MD;  Location: [facility];  Service: Obstetrics;  Laterality: N/A;   OPERATIVE ULTRASOUND N/A 12/23/2020   Procedure: OPERATIVE ULTRASOUND;  Surgeon: Tresia Fruit, MD;  Location: [facility] OR;   Service: Gynecology;  Laterality: N/A;    FAMILY HISTORY: Family History  Problem Relation Age of Onset   Diabetes Mother    Other Father        Hx unknown   Asthma Brother    Colon cancer Neg Hx    Esophageal cancer Neg Hx    Rectal cancer Neg Hx     SOCIAL HISTORY: Social History   Socioeconomic History   Marital status: Single    Spouse name: Not on file   Number of children: 3   Years of education: 12   Highest education level: Not on file  Occupational History   Occupation: Teacher, English as a foreign language  Tobacco Use   Smoking status: Every Day    Current packs/day: 0.50    Average packs/day: 0.5 packs/day for 5.0 years (2.5 ttl pk-yrs)    Types: Cigarettes    Passive exposure: Past   Smokeless tobacco: Never  Vaping Use   Vaping status: Never Used  Substance and Sexual Activity   Alcohol use: Yes    Comment: occasional, not while preg   Drug use: Not Currently    Types: Marijuana    Comment: Occasional 3 times per month   Sexual activity: Yes    Partners: Male    Birth control/protection: None  Other Topics Concern   Not on file  Social History Narrative   Patient is single and lives at home alone. Patient works at [employer]. Right handed. Caffeine three daily.   Social Drivers of Corporate investment banker Strain: Not on file  Food Insecurity: No Food Insecurity (09/19/2023)   Hunger Vital Sign    Worried About Running Out of Food in the Last Year: Never true    Ran Out of Food in the Last Year: Never true  Transportation Needs: No Transportation Needs (09/19/2023)   PRAPARE - Administrator, Civil Service (Medical): No    Lack of Transportation (Non-Medical): No  Physical Activity: Not on file  Stress: Not on file  Social Connections: Patient Declined (09/19/2023)   Social Connection and Isolation Panel [NHANES]    Frequency of Communication with Friends and Family: Patient declined    Frequency of Social Gatherings with Friends and Family: Patient  declined    Attends Religious Services: Patient declined    Database administrator or Organizations: Patient declined    Attends Banker Meetings: Patient declined    Marital Status: Patient declined  Intimate Partner Violence: Not  At Risk (09/19/2023)   Humiliation, Afraid, Rape, and Kick questionnaire    Fear of Current or Ex-Partner: No    Emotionally Abused: No    Physically Abused: No    Sexually Abused: No    PHYSICAL EXAM  There were no vitals filed for this visit.  There is no height or weight on file to calculate BMI.  Generalized: Well developed, in no acute distress   Via video visit, is alert and oriented, speech is clear and concise.  Facial symmetry noted.  Moves about freely.  DIAGNOSTIC DATA (LABS, IMAGING, TESTING) - I reviewed patient records, labs, notes, testing and imaging myself where available.  Lab Results  Component Value Date   WBC 11.4 (H) 09/20/2023   HGB 9.3 (L) 09/20/2023   HCT 27.2 (L) 09/20/2023   MCV 93.5 09/20/2023   PLT 191 09/20/2023      Component Value Date/Time   NA 134 (L) 08/22/2023 0923   NA 139 03/16/2023 1139   K 3.5 08/22/2023 0923   CL 104 08/22/2023 0923   CO2 19 (L) 08/22/2023 0923   GLUCOSE 80 08/22/2023 0923   BUN 5 (L) 08/22/2023 0923   BUN 5 (L) 03/16/2023 1139   CREATININE 0.42 (L) 08/22/2023 0923   CALCIUM 8.3 (L) 08/22/2023 0923   PROT 6.3 (L) 08/22/2023 0923   PROT 6.4 03/16/2023 1139   ALBUMIN 2.8 (L) 08/22/2023 0923   ALBUMIN 3.8 (L) 03/16/2023 1139   AST 18 08/22/2023 0923   ALT 16 08/22/2023 0923   ALKPHOS 110 08/22/2023 0923   BILITOT 1.6 (H) 08/22/2023 0923   BILITOT 0.3 03/16/2023 1139   GFRNONAA >60 08/22/2023 0923   GFRAA >60 10/07/2019 2317   No results found for: CHOL, HDL, LDLCALC, LDLDIRECT, TRIG, CHOLHDL Lab Results  Component Value Date   HGBA1C 5.3 03/16/2023   No results found for: [id number] Lab Results  Component Value Date   TSH 0.462 03/16/2023     Jeanmarie Millet, AGNP-C, DNP 01/03/2024, 5:53 AM [facility] [address] [place], [place] [address] [phone]

## 2024-01-03 NOTE — Telephone Encounter (Signed)
 I called x 2 for VV, no answer I left a message.
